# Patient Record
Sex: Female | Born: 1935 | Race: Black or African American | Hispanic: No | Marital: Single | Smoking: Never smoker
Health system: Southern US, Community
[De-identification: ages and names within clinical notes are randomized; demographics above are authoritative.]

## PROBLEM LIST (undated history)

## (undated) DIAGNOSIS — I5030 Unspecified diastolic (congestive) heart failure: Secondary | ICD-10-CM

## (undated) DIAGNOSIS — M545 Other chronic pain: Secondary | ICD-10-CM

## (undated) DIAGNOSIS — K648 Other hemorrhoids: Secondary | ICD-10-CM

## (undated) DIAGNOSIS — F419 Anxiety disorder, unspecified: Secondary | ICD-10-CM

## (undated) DIAGNOSIS — N289 Disorder of kidney and ureter, unspecified: Secondary | ICD-10-CM

## (undated) DIAGNOSIS — K589 Irritable bowel syndrome without diarrhea: Secondary | ICD-10-CM

## (undated) DIAGNOSIS — E039 Hypothyroidism, unspecified: Secondary | ICD-10-CM

## (undated) DIAGNOSIS — E669 Obesity, unspecified: Secondary | ICD-10-CM

## (undated) DIAGNOSIS — R0600 Dyspnea, unspecified: Secondary | ICD-10-CM

## (undated) DIAGNOSIS — D3000 Benign neoplasm of unspecified kidney: Secondary | ICD-10-CM

## (undated) DIAGNOSIS — Z860100 Personal history of colon polyps, unspecified: Secondary | ICD-10-CM

## (undated) DIAGNOSIS — E7800 Pure hypercholesterolemia, unspecified: Secondary | ICD-10-CM

## (undated) DIAGNOSIS — Z8719 Personal history of other diseases of the digestive system: Secondary | ICD-10-CM

## (undated) DIAGNOSIS — I679 Cerebrovascular disease, unspecified: Secondary | ICD-10-CM

## (undated) DIAGNOSIS — G4733 Obstructive sleep apnea (adult) (pediatric): Secondary | ICD-10-CM

## (undated) DIAGNOSIS — D369 Benign neoplasm, unspecified site: Secondary | ICD-10-CM

## (undated) DIAGNOSIS — K5790 Diverticulosis of intestine, part unspecified, without perforation or abscess without bleeding: Secondary | ICD-10-CM

## (undated) DIAGNOSIS — M1990 Unspecified osteoarthritis, unspecified site: Secondary | ICD-10-CM

## (undated) DIAGNOSIS — G8929 Other chronic pain: Secondary | ICD-10-CM

## (undated) DIAGNOSIS — K219 Gastro-esophageal reflux disease without esophagitis: Secondary | ICD-10-CM

## (undated) DIAGNOSIS — L03119 Cellulitis of unspecified part of limb: Secondary | ICD-10-CM

## (undated) DIAGNOSIS — I872 Venous insufficiency (chronic) (peripheral): Secondary | ICD-10-CM

## (undated) DIAGNOSIS — T884XXA Failed or difficult intubation, initial encounter: Secondary | ICD-10-CM

## (undated) DIAGNOSIS — I10 Essential (primary) hypertension: Secondary | ICD-10-CM

## (undated) DIAGNOSIS — K759 Inflammatory liver disease, unspecified: Secondary | ICD-10-CM

## (undated) HISTORY — PX: ABDOMINAL HYSTERECTOMY: SHX81

## (undated) HISTORY — DX: Diverticulosis of intestine, part unspecified, without perforation or abscess without bleeding: K57.90

## (undated) HISTORY — DX: Other hemorrhoids: K64.8

## (undated) HISTORY — PX: TONSILLECTOMY: SUR1361

## (undated) HISTORY — DX: Benign neoplasm, unspecified site: D36.9

## (undated) HISTORY — DX: Cellulitis of unspecified part of limb: L03.119

## (undated) HISTORY — DX: Cerebrovascular disease, unspecified: I67.9

## (undated) HISTORY — DX: Personal history of colon polyps, unspecified: Z86.0100

## (undated) HISTORY — PX: APPENDECTOMY: SHX54

## (undated) HISTORY — PX: DILATION AND CURETTAGE OF UTERUS: SHX78

## (undated) HISTORY — DX: Hypothyroidism, unspecified: E03.9

## (undated) HISTORY — DX: Low back pain: M54.5

## (undated) HISTORY — DX: Gastro-esophageal reflux disease without esophagitis: K21.9

## (undated) HISTORY — DX: Anxiety disorder, unspecified: F41.9

## (undated) HISTORY — DX: Essential (primary) hypertension: I10

## (undated) HISTORY — DX: Personal history of colonic polyps: Z86.010

## (undated) HISTORY — DX: Unspecified diastolic (congestive) heart failure: I50.30

## (undated) HISTORY — DX: Obesity, unspecified: E66.9

## (undated) HISTORY — DX: Disorder of kidney and ureter, unspecified: N28.9

## (undated) HISTORY — DX: Benign neoplasm of unspecified kidney: D30.00

## (undated) HISTORY — DX: Pure hypercholesterolemia, unspecified: E78.00

## (undated) HISTORY — DX: Other chronic pain: G89.29

## (undated) HISTORY — DX: Irritable bowel syndrome, unspecified: K58.9

## (undated) HISTORY — DX: Unspecified osteoarthritis, unspecified site: M19.90

## (undated) HISTORY — PX: OTHER SURGICAL HISTORY: SHX169

## (undated) HISTORY — DX: Other chronic pain: M54.50

## (undated) HISTORY — PX: COLONOSCOPY: SHX174

## (undated) HISTORY — DX: Obstructive sleep apnea (adult) (pediatric): G47.33

## (undated) HISTORY — DX: Venous insufficiency (chronic) (peripheral): I87.2

---

## 1998-02-27 DIAGNOSIS — D369 Benign neoplasm, unspecified site: Secondary | ICD-10-CM

## 1998-02-27 HISTORY — DX: Benign neoplasm, unspecified site: D36.9

## 1998-03-19 ENCOUNTER — Other Ambulatory Visit: Admission: RE | Admit: 1998-03-19 | Discharge: 1998-03-19 | Payer: Self-pay | Admitting: *Deleted

## 1998-08-17 ENCOUNTER — Other Ambulatory Visit: Admission: RE | Admit: 1998-08-17 | Discharge: 1998-08-17 | Payer: Self-pay | Admitting: Gastroenterology

## 1998-08-17 ENCOUNTER — Encounter (INDEPENDENT_AMBULATORY_CARE_PROVIDER_SITE_OTHER): Payer: Self-pay

## 1999-03-24 ENCOUNTER — Other Ambulatory Visit: Admission: RE | Admit: 1999-03-24 | Discharge: 1999-03-24 | Payer: Self-pay | Admitting: *Deleted

## 2000-03-21 ENCOUNTER — Other Ambulatory Visit: Admission: RE | Admit: 2000-03-21 | Discharge: 2000-03-21 | Payer: Self-pay | Admitting: *Deleted

## 2000-10-28 HISTORY — PX: OTHER SURGICAL HISTORY: SHX169

## 2000-11-07 ENCOUNTER — Ambulatory Visit (HOSPITAL_COMMUNITY): Admission: RE | Admit: 2000-11-07 | Discharge: 2000-11-08 | Payer: Self-pay | Admitting: Otolaryngology

## 2000-11-07 ENCOUNTER — Encounter (INDEPENDENT_AMBULATORY_CARE_PROVIDER_SITE_OTHER): Payer: Self-pay | Admitting: *Deleted

## 2001-02-27 HISTORY — PX: MASS EXCISION: SHX2000

## 2001-04-12 ENCOUNTER — Other Ambulatory Visit: Admission: RE | Admit: 2001-04-12 | Discharge: 2001-04-12 | Payer: Self-pay | Admitting: *Deleted

## 2001-06-05 ENCOUNTER — Ambulatory Visit (HOSPITAL_BASED_OUTPATIENT_CLINIC_OR_DEPARTMENT_OTHER): Admission: RE | Admit: 2001-06-05 | Discharge: 2001-06-05 | Payer: Self-pay | Admitting: Nephrology

## 2001-06-05 ENCOUNTER — Encounter: Payer: Self-pay | Admitting: Internal Medicine

## 2002-03-11 ENCOUNTER — Encounter: Payer: Self-pay | Admitting: Gastroenterology

## 2002-03-11 ENCOUNTER — Ambulatory Visit (HOSPITAL_COMMUNITY): Admission: RE | Admit: 2002-03-11 | Discharge: 2002-03-11 | Payer: Self-pay | Admitting: Gastroenterology

## 2002-05-14 ENCOUNTER — Other Ambulatory Visit: Admission: RE | Admit: 2002-05-14 | Discharge: 2002-05-14 | Payer: Self-pay | Admitting: *Deleted

## 2003-05-29 HISTORY — PX: FUNCTIONAL ENDOSCOPIC SINUS SURGERY: SUR616

## 2003-06-17 ENCOUNTER — Encounter: Admission: RE | Admit: 2003-06-17 | Discharge: 2003-06-17 | Payer: Self-pay | Admitting: Otolaryngology

## 2003-06-18 ENCOUNTER — Ambulatory Visit (HOSPITAL_BASED_OUTPATIENT_CLINIC_OR_DEPARTMENT_OTHER): Admission: RE | Admit: 2003-06-18 | Discharge: 2003-06-18 | Payer: Self-pay | Admitting: Otolaryngology

## 2003-06-18 ENCOUNTER — Ambulatory Visit: Admission: RE | Admit: 2003-06-18 | Discharge: 2003-06-18 | Payer: Self-pay | Admitting: Otolaryngology

## 2003-06-18 ENCOUNTER — Encounter (INDEPENDENT_AMBULATORY_CARE_PROVIDER_SITE_OTHER): Payer: Self-pay | Admitting: Specialist

## 2004-01-20 ENCOUNTER — Ambulatory Visit: Payer: Self-pay | Admitting: Pulmonary Disease

## 2004-02-08 ENCOUNTER — Ambulatory Visit: Payer: Self-pay | Admitting: Gastroenterology

## 2004-03-01 ENCOUNTER — Ambulatory Visit: Payer: Self-pay | Admitting: Pulmonary Disease

## 2004-05-09 ENCOUNTER — Ambulatory Visit: Payer: Self-pay | Admitting: Gastroenterology

## 2004-05-24 ENCOUNTER — Ambulatory Visit: Payer: Self-pay | Admitting: Gastroenterology

## 2004-07-13 ENCOUNTER — Ambulatory Visit: Payer: Self-pay | Admitting: Gastroenterology

## 2004-07-20 ENCOUNTER — Ambulatory Visit: Payer: Self-pay | Admitting: Pulmonary Disease

## 2004-12-09 ENCOUNTER — Ambulatory Visit: Payer: Self-pay | Admitting: Pulmonary Disease

## 2005-01-16 ENCOUNTER — Ambulatory Visit: Payer: Self-pay | Admitting: Pulmonary Disease

## 2005-07-17 ENCOUNTER — Ambulatory Visit: Payer: Self-pay | Admitting: Pulmonary Disease

## 2005-10-26 ENCOUNTER — Ambulatory Visit: Payer: Self-pay | Admitting: Pulmonary Disease

## 2005-12-12 ENCOUNTER — Ambulatory Visit: Payer: Self-pay | Admitting: Pulmonary Disease

## 2005-12-25 ENCOUNTER — Ambulatory Visit: Payer: Self-pay | Admitting: Pulmonary Disease

## 2006-01-04 ENCOUNTER — Ambulatory Visit: Payer: Self-pay | Admitting: Pulmonary Disease

## 2006-03-01 ENCOUNTER — Ambulatory Visit: Payer: Self-pay | Admitting: Pulmonary Disease

## 2006-03-27 ENCOUNTER — Ambulatory Visit: Payer: Self-pay | Admitting: Pulmonary Disease

## 2006-05-08 ENCOUNTER — Ambulatory Visit: Payer: Self-pay | Admitting: Pulmonary Disease

## 2006-05-16 ENCOUNTER — Ambulatory Visit: Payer: Self-pay | Admitting: Cardiovascular Disease

## 2006-05-22 ENCOUNTER — Ambulatory Visit: Payer: Self-pay | Admitting: Pulmonary Disease

## 2006-05-22 LAB — CONVERTED CEMR LAB
OCCULT 2: NEGATIVE
OCCULT 3: NEGATIVE

## 2006-06-02 ENCOUNTER — Encounter: Admission: RE | Admit: 2006-06-02 | Discharge: 2006-06-02 | Payer: Self-pay | Admitting: Urology

## 2006-06-05 ENCOUNTER — Ambulatory Visit: Payer: Self-pay

## 2006-06-05 ENCOUNTER — Encounter: Payer: Self-pay | Admitting: Cardiology

## 2006-06-18 ENCOUNTER — Ambulatory Visit: Payer: Self-pay | Admitting: Pulmonary Disease

## 2006-06-29 ENCOUNTER — Ambulatory Visit: Payer: Self-pay | Admitting: Gastroenterology

## 2006-09-11 ENCOUNTER — Ambulatory Visit: Payer: Self-pay | Admitting: Pulmonary Disease

## 2006-09-11 LAB — CONVERTED CEMR LAB
ALT: 23 units/L (ref 0–35)
AST: 25 units/L (ref 0–37)
Albumin: 3.9 g/dL (ref 3.5–5.2)
Alkaline Phosphatase: 55 units/L (ref 39–117)
BUN: 19 mg/dL (ref 6–23)
Basophils Absolute: 0.1 10*3/uL (ref 0.0–0.1)
Calcium: 9.1 mg/dL (ref 8.4–10.5)
Chloride: 107 meq/L (ref 96–112)
Cholesterol: 156 mg/dL (ref 0–200)
Creatinine, Ser: 1.1 mg/dL (ref 0.4–1.2)
GFR calc non Af Amer: 52 mL/min
HCT: 37.8 % (ref 36.0–46.0)
LDL Cholesterol: 78 mg/dL (ref 0–99)
MCHC: 34.3 g/dL (ref 30.0–36.0)
Monocytes Relative: 11.7 % — ABNORMAL HIGH (ref 3.0–11.0)
Neutrophils Relative %: 30.9 % — ABNORMAL LOW (ref 43.0–77.0)
Platelets: 236 10*3/uL (ref 150–400)
RBC: 4.31 M/uL (ref 3.87–5.11)
RDW: 13.9 % (ref 11.5–14.6)
Total Bilirubin: 0.8 mg/dL (ref 0.3–1.2)
Total CHOL/HDL Ratio: 2.9
Triglycerides: 118 mg/dL (ref 0–149)

## 2006-10-26 ENCOUNTER — Ambulatory Visit: Payer: Self-pay | Admitting: Pulmonary Disease

## 2006-11-12 ENCOUNTER — Encounter: Admission: RE | Admit: 2006-11-12 | Discharge: 2006-11-12 | Payer: Self-pay | Admitting: Orthopedic Surgery

## 2006-11-14 ENCOUNTER — Ambulatory Visit (HOSPITAL_BASED_OUTPATIENT_CLINIC_OR_DEPARTMENT_OTHER): Admission: RE | Admit: 2006-11-14 | Discharge: 2006-11-14 | Payer: Self-pay | Admitting: Orthopedic Surgery

## 2006-11-14 ENCOUNTER — Encounter (INDEPENDENT_AMBULATORY_CARE_PROVIDER_SITE_OTHER): Payer: Self-pay | Admitting: Orthopedic Surgery

## 2006-12-12 ENCOUNTER — Ambulatory Visit: Payer: Self-pay | Admitting: Pulmonary Disease

## 2007-01-04 DIAGNOSIS — E785 Hyperlipidemia, unspecified: Secondary | ICD-10-CM | POA: Insufficient documentation

## 2007-01-04 DIAGNOSIS — K219 Gastro-esophageal reflux disease without esophagitis: Secondary | ICD-10-CM

## 2007-01-04 DIAGNOSIS — D126 Benign neoplasm of colon, unspecified: Secondary | ICD-10-CM | POA: Insufficient documentation

## 2007-01-04 DIAGNOSIS — E039 Hypothyroidism, unspecified: Secondary | ICD-10-CM | POA: Insufficient documentation

## 2007-01-04 DIAGNOSIS — M109 Gout, unspecified: Secondary | ICD-10-CM | POA: Insufficient documentation

## 2007-01-04 DIAGNOSIS — M1990 Unspecified osteoarthritis, unspecified site: Secondary | ICD-10-CM | POA: Insufficient documentation

## 2007-01-04 DIAGNOSIS — K589 Irritable bowel syndrome without diarrhea: Secondary | ICD-10-CM

## 2007-01-04 DIAGNOSIS — I10 Essential (primary) hypertension: Secondary | ICD-10-CM | POA: Insufficient documentation

## 2007-01-04 DIAGNOSIS — M545 Low back pain: Secondary | ICD-10-CM

## 2007-01-04 DIAGNOSIS — I872 Venous insufficiency (chronic) (peripheral): Secondary | ICD-10-CM | POA: Insufficient documentation

## 2007-03-21 ENCOUNTER — Ambulatory Visit: Payer: Self-pay | Admitting: Pulmonary Disease

## 2007-03-21 DIAGNOSIS — D3000 Benign neoplasm of unspecified kidney: Secondary | ICD-10-CM

## 2007-03-21 DIAGNOSIS — F411 Generalized anxiety disorder: Secondary | ICD-10-CM | POA: Insufficient documentation

## 2007-03-21 DIAGNOSIS — I679 Cerebrovascular disease, unspecified: Secondary | ICD-10-CM

## 2007-03-21 DIAGNOSIS — N184 Chronic kidney disease, stage 4 (severe): Secondary | ICD-10-CM | POA: Insufficient documentation

## 2007-03-21 DIAGNOSIS — N183 Chronic kidney disease, stage 3 (moderate): Secondary | ICD-10-CM

## 2007-03-22 ENCOUNTER — Telehealth (INDEPENDENT_AMBULATORY_CARE_PROVIDER_SITE_OTHER): Payer: Self-pay | Admitting: *Deleted

## 2007-04-09 ENCOUNTER — Ambulatory Visit: Payer: Self-pay | Admitting: Internal Medicine

## 2007-04-11 ENCOUNTER — Ambulatory Visit: Payer: Self-pay | Admitting: Pulmonary Disease

## 2007-04-11 LAB — CONVERTED CEMR LAB
Fecal Occult Blood: NEGATIVE
OCCULT 2: NEGATIVE
OCCULT 3: NEGATIVE
OCCULT 4: NEGATIVE
OCCULT 5: NEGATIVE

## 2007-04-19 LAB — CONVERTED CEMR LAB
ALT: 24 units/L (ref 0–35)
AST: 24 units/L (ref 0–37)
Albumin: 4.2 g/dL (ref 3.5–5.2)
Alkaline Phosphatase: 57 units/L (ref 39–117)
BUN: 15 mg/dL (ref 6–23)
Bacteria, UA: NEGATIVE
Basophils Absolute: 0.1 10*3/uL (ref 0.0–0.1)
Bilirubin, Direct: 0.1 mg/dL (ref 0.0–0.3)
Calcium: 9.6 mg/dL (ref 8.4–10.5)
Chloride: 102 meq/L (ref 96–112)
Eosinophils Absolute: 0.2 10*3/uL (ref 0.0–0.6)
Eosinophils Relative: 2.4 % (ref 0.0–5.0)
GFR calc Af Amer: 70 mL/min
GFR calc non Af Amer: 58 mL/min
Glucose, Bld: 93 mg/dL (ref 70–99)
HCT: 38.4 % (ref 36.0–46.0)
Hemoglobin, Urine: NEGATIVE
Ketones, ur: NEGATIVE mg/dL
LDL Cholesterol: 94 mg/dL (ref 0–99)
Leukocytes, UA: NEGATIVE
Mucus, UA: NEGATIVE
Neutrophils Relative %: 37.8 % — ABNORMAL LOW (ref 43.0–77.0)
Platelets: 268 10*3/uL (ref 150–400)
RBC: 4.4 M/uL (ref 3.87–5.11)
RDW: 12.8 % (ref 11.5–14.6)
Total CHOL/HDL Ratio: 3.3
Total Protein, Urine: NEGATIVE mg/dL
Triglycerides: 158 mg/dL — ABNORMAL HIGH (ref 0–149)
WBC: 8.7 10*3/uL (ref 4.5–10.5)
pH: 7 (ref 5.0–8.0)

## 2007-09-17 ENCOUNTER — Ambulatory Visit: Payer: Self-pay | Admitting: Pulmonary Disease

## 2007-09-22 DIAGNOSIS — H409 Unspecified glaucoma: Secondary | ICD-10-CM | POA: Insufficient documentation

## 2007-09-23 LAB — CONVERTED CEMR LAB
ALT: 26 units/L (ref 0–35)
AST: 28 units/L (ref 0–37)
Albumin: 4.2 g/dL (ref 3.5–5.2)
BUN: 19 mg/dL (ref 6–23)
Basophils Relative: 0.5 % (ref 0.0–3.0)
CO2: 28 meq/L (ref 19–32)
Calcium: 10 mg/dL (ref 8.4–10.5)
Chloride: 102 meq/L (ref 96–112)
Cholesterol: 159 mg/dL (ref 0–200)
Creatinine, Ser: 1.1 mg/dL (ref 0.4–1.2)
Eosinophils Absolute: 0.2 10*3/uL (ref 0.0–0.7)
Eosinophils Relative: 2.3 % (ref 0.0–5.0)
GFR calc non Af Amer: 52 mL/min
Hemoglobin: 13.3 g/dL (ref 12.0–15.0)
MCHC: 33.8 g/dL (ref 30.0–36.0)
MCV: 89 fL (ref 78.0–100.0)
Neutro Abs: 3.1 10*3/uL (ref 1.4–7.7)
Neutrophils Relative %: 35.1 % — ABNORMAL LOW (ref 43.0–77.0)
RBC: 4.41 M/uL (ref 3.87–5.11)
TSH: 2.34 microintl units/mL (ref 0.35–5.50)
Uric Acid, Serum: 8.1 mg/dL — ABNORMAL HIGH (ref 2.4–7.0)
VLDL: 28 mg/dL (ref 0–40)
WBC: 8.9 10*3/uL (ref 4.5–10.5)

## 2007-09-24 ENCOUNTER — Telehealth (INDEPENDENT_AMBULATORY_CARE_PROVIDER_SITE_OTHER): Payer: Self-pay | Admitting: *Deleted

## 2007-09-27 ENCOUNTER — Telehealth (INDEPENDENT_AMBULATORY_CARE_PROVIDER_SITE_OTHER): Payer: Self-pay | Admitting: *Deleted

## 2007-10-14 ENCOUNTER — Ambulatory Visit (HOSPITAL_BASED_OUTPATIENT_CLINIC_OR_DEPARTMENT_OTHER): Admission: RE | Admit: 2007-10-14 | Discharge: 2007-10-14 | Payer: Self-pay | Admitting: Pulmonary Disease

## 2007-10-14 ENCOUNTER — Encounter: Payer: Self-pay | Admitting: Pulmonary Disease

## 2007-10-18 ENCOUNTER — Ambulatory Visit: Payer: Self-pay | Admitting: Pulmonary Disease

## 2007-10-21 ENCOUNTER — Encounter: Payer: Self-pay | Admitting: Pulmonary Disease

## 2007-11-11 ENCOUNTER — Ambulatory Visit: Payer: Self-pay | Admitting: Pulmonary Disease

## 2007-11-11 DIAGNOSIS — G4733 Obstructive sleep apnea (adult) (pediatric): Secondary | ICD-10-CM

## 2007-11-27 ENCOUNTER — Ambulatory Visit: Payer: Self-pay | Admitting: Pulmonary Disease

## 2007-12-11 ENCOUNTER — Ambulatory Visit: Payer: Self-pay | Admitting: Pulmonary Disease

## 2007-12-13 ENCOUNTER — Telehealth (INDEPENDENT_AMBULATORY_CARE_PROVIDER_SITE_OTHER): Payer: Self-pay | Admitting: *Deleted

## 2007-12-13 ENCOUNTER — Telehealth: Payer: Self-pay | Admitting: Pulmonary Disease

## 2008-01-02 ENCOUNTER — Encounter: Payer: Self-pay | Admitting: Pulmonary Disease

## 2008-01-09 ENCOUNTER — Telehealth: Payer: Self-pay | Admitting: Pulmonary Disease

## 2008-01-20 ENCOUNTER — Telehealth (INDEPENDENT_AMBULATORY_CARE_PROVIDER_SITE_OTHER): Payer: Self-pay | Admitting: *Deleted

## 2008-02-03 ENCOUNTER — Telehealth (INDEPENDENT_AMBULATORY_CARE_PROVIDER_SITE_OTHER): Payer: Self-pay | Admitting: *Deleted

## 2008-02-03 ENCOUNTER — Encounter: Payer: Self-pay | Admitting: Pulmonary Disease

## 2008-03-09 ENCOUNTER — Encounter: Payer: Self-pay | Admitting: Pulmonary Disease

## 2008-03-20 ENCOUNTER — Ambulatory Visit: Payer: Self-pay | Admitting: Pulmonary Disease

## 2008-03-22 LAB — CONVERTED CEMR LAB
AST: 28 units/L (ref 0–37)
Alkaline Phosphatase: 50 units/L (ref 39–117)
Bilirubin, Direct: 0.1 mg/dL (ref 0.0–0.3)
Chloride: 103 meq/L (ref 96–112)
GFR calc non Af Amer: 58 mL/min
LDL Cholesterol: 75 mg/dL (ref 0–99)
Potassium: 4.1 meq/L (ref 3.5–5.1)
Total Bilirubin: 0.7 mg/dL (ref 0.3–1.2)
Total CHOL/HDL Ratio: 2.8

## 2008-04-23 ENCOUNTER — Encounter: Payer: Self-pay | Admitting: Pulmonary Disease

## 2008-04-24 ENCOUNTER — Encounter: Payer: Self-pay | Admitting: Pulmonary Disease

## 2008-04-27 ENCOUNTER — Emergency Department (HOSPITAL_COMMUNITY): Admission: EM | Admit: 2008-04-27 | Discharge: 2008-04-28 | Payer: Self-pay | Admitting: Emergency Medicine

## 2008-04-28 ENCOUNTER — Telehealth (INDEPENDENT_AMBULATORY_CARE_PROVIDER_SITE_OTHER): Payer: Self-pay | Admitting: *Deleted

## 2008-05-01 ENCOUNTER — Ambulatory Visit: Payer: Self-pay | Admitting: Internal Medicine

## 2008-05-05 DIAGNOSIS — R0789 Other chest pain: Secondary | ICD-10-CM | POA: Insufficient documentation

## 2008-05-19 ENCOUNTER — Encounter: Payer: Self-pay | Admitting: Cardiovascular Disease

## 2008-05-20 ENCOUNTER — Ambulatory Visit: Payer: Self-pay | Admitting: Cardiovascular Disease

## 2008-06-04 ENCOUNTER — Ambulatory Visit: Payer: Self-pay

## 2008-06-04 ENCOUNTER — Encounter: Payer: Self-pay | Admitting: Cardiovascular Disease

## 2008-06-08 ENCOUNTER — Ambulatory Visit: Payer: Self-pay | Admitting: Pulmonary Disease

## 2008-06-17 ENCOUNTER — Encounter: Payer: Self-pay | Admitting: Pulmonary Disease

## 2008-07-28 ENCOUNTER — Telehealth (INDEPENDENT_AMBULATORY_CARE_PROVIDER_SITE_OTHER): Payer: Self-pay | Admitting: *Deleted

## 2008-08-11 ENCOUNTER — Telehealth (INDEPENDENT_AMBULATORY_CARE_PROVIDER_SITE_OTHER): Payer: Self-pay | Admitting: *Deleted

## 2008-08-20 ENCOUNTER — Telehealth (INDEPENDENT_AMBULATORY_CARE_PROVIDER_SITE_OTHER): Payer: Self-pay | Admitting: *Deleted

## 2008-08-24 ENCOUNTER — Ambulatory Visit: Payer: Self-pay | Admitting: Pulmonary Disease

## 2008-08-24 DIAGNOSIS — J309 Allergic rhinitis, unspecified: Secondary | ICD-10-CM | POA: Insufficient documentation

## 2008-09-17 ENCOUNTER — Ambulatory Visit: Payer: Self-pay | Admitting: Pulmonary Disease

## 2008-09-17 LAB — CONVERTED CEMR LAB: Vit D, 25-Hydroxy: 46 ng/mL (ref 30–89)

## 2008-09-19 LAB — CONVERTED CEMR LAB
Alkaline Phosphatase: 51 units/L (ref 39–117)
Basophils Absolute: 0 10*3/uL (ref 0.0–0.1)
Basophils Relative: 0.6 % (ref 0.0–3.0)
Bilirubin, Direct: 0.1 mg/dL (ref 0.0–0.3)
CO2: 31 meq/L (ref 19–32)
Calcium: 9.6 mg/dL (ref 8.4–10.5)
Creatinine, Ser: 1.1 mg/dL (ref 0.4–1.2)
Eosinophils Absolute: 0.4 10*3/uL (ref 0.0–0.7)
HDL: 58.6 mg/dL (ref >39.00)
LDL Cholesterol: 76 mg/dL (ref 0–99)
Lymphocytes Relative: 50.2 % — ABNORMAL HIGH (ref 12.0–46.0)
MCHC: 34.1 g/dL (ref 30.0–36.0)
Neutrophils Relative %: 36.8 % — ABNORMAL LOW (ref 43.0–77.0)
RBC: 4.3 M/uL (ref 3.87–5.11)
Total CHOL/HDL Ratio: 3
Total Protein: 7.6 g/dL (ref 6.0–8.3)
Triglycerides: 81 mg/dL (ref 0.0–149.0)
VLDL: 16.2 mg/dL (ref 0.0–40.0)

## 2008-10-21 ENCOUNTER — Encounter: Payer: Self-pay | Admitting: Pulmonary Disease

## 2008-11-18 ENCOUNTER — Telehealth (INDEPENDENT_AMBULATORY_CARE_PROVIDER_SITE_OTHER): Payer: Self-pay | Admitting: *Deleted

## 2008-11-25 ENCOUNTER — Ambulatory Visit: Payer: Self-pay | Admitting: Pulmonary Disease

## 2009-01-27 ENCOUNTER — Telehealth (INDEPENDENT_AMBULATORY_CARE_PROVIDER_SITE_OTHER): Payer: Self-pay | Admitting: *Deleted

## 2009-03-16 ENCOUNTER — Ambulatory Visit: Payer: Self-pay | Admitting: Pulmonary Disease

## 2009-03-16 LAB — CONVERTED CEMR LAB
ALT: 21 U/L
AST: 21 U/L
Albumin: 4 g/dL
Alkaline Phosphatase: 56 U/L
BUN: 16 mg/dL
Basophils Absolute: 0.1 10*3/uL
Basophils Relative: 0.6 %
Bilirubin, Direct: 0.1 mg/dL
CO2: 30 meq/L
Calcium: 10 mg/dL
Chloride: 103 meq/L
Cholesterol: 155 mg/dL
Creatinine, Ser: 1 mg/dL
Eosinophils Absolute: 0.3 10*3/uL
Eosinophils Relative: 2.9 %
GFR calc non Af Amer: 69.48 mL/min
Glucose, Bld: 71 mg/dL
HCT: 38.3 %
HDL: 48.4 mg/dL
Hemoglobin: 12.5 g/dL
LDL Cholesterol: 77 mg/dL
Lymphocytes Relative: 39.5 %
Lymphs Abs: 4.3 10*3/uL — ABNORMAL HIGH
MCHC: 32.8 g/dL
MCV: 91.4 fL
Monocytes Absolute: 1.1 10*3/uL — ABNORMAL HIGH
Monocytes Relative: 10.2 %
Neutro Abs: 5 10*3/uL
Neutrophils Relative %: 46.8 %
Platelets: 294 10*3/uL
Potassium: 4 meq/L
RBC: 4.19 M/uL
RDW: 13 %
Sodium: 146 meq/L — ABNORMAL HIGH
TSH: 2.95 u[IU]/mL
Total Bilirubin: 0.5 mg/dL
Total CHOL/HDL Ratio: 3
Total Protein: 7.8 g/dL
Triglycerides: 148 mg/dL
Uric Acid, Serum: 5.7 mg/dL
VLDL: 29.6 mg/dL
WBC: 10.8 10*3/uL — ABNORMAL HIGH

## 2009-04-23 ENCOUNTER — Encounter (INDEPENDENT_AMBULATORY_CARE_PROVIDER_SITE_OTHER): Payer: Self-pay | Admitting: *Deleted

## 2009-04-27 ENCOUNTER — Encounter (INDEPENDENT_AMBULATORY_CARE_PROVIDER_SITE_OTHER): Payer: Self-pay | Admitting: *Deleted

## 2009-04-28 ENCOUNTER — Ambulatory Visit: Payer: Self-pay | Admitting: Pulmonary Disease

## 2009-05-03 ENCOUNTER — Encounter (INDEPENDENT_AMBULATORY_CARE_PROVIDER_SITE_OTHER): Payer: Self-pay

## 2009-05-04 ENCOUNTER — Ambulatory Visit: Payer: Self-pay | Admitting: Internal Medicine

## 2009-05-12 ENCOUNTER — Telehealth (INDEPENDENT_AMBULATORY_CARE_PROVIDER_SITE_OTHER): Payer: Self-pay | Admitting: *Deleted

## 2009-05-20 ENCOUNTER — Ambulatory Visit: Payer: Self-pay | Admitting: Internal Medicine

## 2009-05-26 ENCOUNTER — Encounter: Payer: Self-pay | Admitting: Internal Medicine

## 2009-06-24 ENCOUNTER — Encounter: Payer: Self-pay | Admitting: Pulmonary Disease

## 2009-06-28 ENCOUNTER — Ambulatory Visit: Payer: Self-pay | Admitting: Pulmonary Disease

## 2009-08-27 ENCOUNTER — Telehealth (INDEPENDENT_AMBULATORY_CARE_PROVIDER_SITE_OTHER): Payer: Self-pay | Admitting: *Deleted

## 2009-08-31 ENCOUNTER — Ambulatory Visit: Payer: Self-pay | Admitting: Pulmonary Disease

## 2009-09-02 ENCOUNTER — Ambulatory Visit: Payer: Self-pay | Admitting: Pulmonary Disease

## 2009-09-05 LAB — CONVERTED CEMR LAB
Albumin: 4 g/dL (ref 3.5–5.2)
BUN: 17 mg/dL (ref 6–23)
Bilirubin, Direct: 0.1 mg/dL (ref 0.0–0.3)
Chloride: 106 meq/L (ref 96–112)
Cholesterol: 147 mg/dL (ref 0–200)
HDL: 55.5 mg/dL (ref >39.00)
LDL Cholesterol: 71 mg/dL (ref 0–99)
Potassium: 3.9 meq/L (ref 3.5–5.1)
Sodium: 140 meq/L (ref 135–145)
TSH: 2.64 microintl units/mL (ref 0.35–5.50)
Total Protein: 7 g/dL (ref 6.0–8.3)
VLDL: 20.6 mg/dL (ref 0.0–40.0)
Vit D, 25-Hydroxy: 68 ng/mL (ref 30–89)

## 2009-10-14 ENCOUNTER — Telehealth: Payer: Self-pay | Admitting: Pulmonary Disease

## 2009-11-26 ENCOUNTER — Ambulatory Visit: Payer: Self-pay | Admitting: Pulmonary Disease

## 2009-11-28 ENCOUNTER — Encounter: Payer: Self-pay | Admitting: Pulmonary Disease

## 2010-03-08 ENCOUNTER — Ambulatory Visit
Admission: RE | Admit: 2010-03-08 | Discharge: 2010-03-08 | Payer: Self-pay | Source: Home / Self Care | Attending: Pulmonary Disease | Admitting: Pulmonary Disease

## 2010-03-08 ENCOUNTER — Other Ambulatory Visit: Payer: Self-pay | Admitting: Pulmonary Disease

## 2010-03-08 LAB — LIPID PANEL
Cholesterol: 162 mg/dL (ref 0–200)
HDL: 60.1 mg/dL (ref >39.00)
LDL Cholesterol: 80 mg/dL (ref 0–99)
Total CHOL/HDL Ratio: 3
Triglycerides: 110 mg/dL (ref 0.0–149.0)
VLDL: 22 mg/dL (ref 0.0–40.0)

## 2010-03-08 LAB — BASIC METABOLIC PANEL
BUN: 19 mg/dL (ref 6–23)
CO2: 31 mEq/L (ref 19–32)
Calcium: 9.7 mg/dL (ref 8.4–10.5)
Chloride: 105 mEq/L (ref 96–112)
Creatinine, Ser: 0.9 mg/dL (ref 0.4–1.2)
GFR: 83.6 mL/min (ref >60.00)
Glucose, Bld: 86 mg/dL (ref 70–99)
Potassium: 4.2 mEq/L (ref 3.5–5.1)
Sodium: 142 mEq/L (ref 135–145)

## 2010-03-08 LAB — HEPATIC FUNCTION PANEL
ALT: 22 U/L (ref 0–35)
AST: 23 U/L (ref 0–37)
Albumin: 4 g/dL (ref 3.5–5.2)
Alkaline Phosphatase: 50 U/L (ref 39–117)
Bilirubin, Direct: 0.1 mg/dL (ref 0.0–0.3)
Total Bilirubin: 0.6 mg/dL (ref 0.3–1.2)
Total Protein: 7.4 g/dL (ref 6.0–8.3)

## 2010-03-08 LAB — CBC WITH DIFFERENTIAL/PLATELET
Basophils Absolute: 0.1 10*3/uL (ref 0.0–0.1)
Basophils Relative: 0.7 % (ref 0.0–3.0)
Eosinophils Absolute: 0.5 10*3/uL (ref 0.0–0.7)
Eosinophils Relative: 5.4 % — ABNORMAL HIGH (ref 0.0–5.0)
HCT: 39 % (ref 36.0–46.0)
Hemoglobin: 13.2 g/dL (ref 12.0–15.0)
Lymphocytes Relative: 49.2 % — ABNORMAL HIGH (ref 12.0–46.0)
Lymphs Abs: 4.4 10*3/uL — ABNORMAL HIGH (ref 0.7–4.0)
MCHC: 33.8 g/dL (ref 30.0–36.0)
MCV: 89.8 fl (ref 78.0–100.0)
Monocytes Absolute: 0.8 10*3/uL (ref 0.1–1.0)
Monocytes Relative: 9.3 % (ref 3.0–12.0)
Neutro Abs: 3.2 10*3/uL (ref 1.4–7.7)
Neutrophils Relative %: 35.4 % — ABNORMAL LOW (ref 43.0–77.0)
Platelets: 276 10*3/uL (ref 150.0–400.0)
RBC: 4.34 Mil/uL (ref 3.87–5.11)
RDW: 14.1 % (ref 11.5–14.6)
WBC: 9 10*3/uL (ref 4.5–10.5)

## 2010-03-08 LAB — TSH: TSH: 3.59 u[IU]/mL (ref 0.35–5.50)

## 2010-03-08 LAB — URIC ACID: Uric Acid, Serum: 6.1 mg/dL (ref 2.4–7.0)

## 2010-03-20 ENCOUNTER — Encounter: Payer: Self-pay | Admitting: Urology

## 2010-03-29 NOTE — Miscellaneous (Signed)
Summary: Lec previsit  Clinical Lists Changes  Medications: Added new medication of MOVIPREP 100 GM  SOLR (PEG-KCL-NACL-NASULF-NA ASC-C) As per prep instructions. - Signed Rx of MOVIPREP 100 GM  SOLR (PEG-KCL-NACL-NASULF-NA ASC-C) As per prep instructions.;  #1 x 0;  Signed;  Entered by: Ulis Rias RN;  Authorized by: Iva Boop MD, FACG;  Method used: Print then Give to Patient Observations: Added new observation of ALLERGY REV: Done (05/04/2009 13:29)    Prescriptions: MOVIPREP 100 GM  SOLR (PEG-KCL-NACL-NASULF-NA ASC-C) As per prep instructions.  #1 x 0   Entered by:   Ulis Rias RN   Authorized by:   Iva Boop MD, [facility]   Signed by:   Ulis Rias RN on 05/04/2009   Method used:   Print then Give to Patient   RxID:   [id number]

## 2010-03-29 NOTE — Procedures (Signed)
Summary: Colonoscopy  Patient: April Donaldson Note: All result statuses are Final unless otherwise noted.  Tests: (1) Colonoscopy (COL)   COL Colonoscopy           DONE     [facility]     [address]     [place], [place]  [address]           COLONOSCOPY PROCEDURE REPORT           PATIENT:  April Donaldson, April Donaldson  MR#:  [id number]     BIRTHDATE:  [birth year], 77 yrs. old  GENDER:  female     ENDOSCOPIST:  Iva Boop, MD, [facility]           PROCEDURE DATE:  05/20/2009     PROCEDURE:  Colonoscopy with biopsy     ASA CLASS:  Class II     INDICATIONS:  surveillance and screening, history of pre-cancerous     (adenomatous) colon polyps diminutive polyps destroyed in 2000, no     polyps 2002, 5 mm adenoma removed in 2006     MEDICATIONS:   Fentanyl 100 mcg IV, Versed 9 mg IV           DESCRIPTION OF PROCEDURE:   After the risks benefits and     alternatives of the procedure were thoroughly explained, informed     consent was obtained.  Digital rectal exam was performed and     revealed no abnormalities.   The  endoscope was introduced through     the anus and advanced to the cecum, which was identified by both     the appendix and ileocecal valve, without limitations.  The     quality of the prep was excellent, using MoviPrep.  The instrument     was then slowly withdrawn as the colon was fully examined.     Insertion: 7:22 minutes Withdrawal: 8:54 minutes     <<PROCEDUREIMAGES>>           FINDINGS:  Two polyps were found in the proximal transverse colon.     They were diminutive. The polyp was removed using cold biopsy     forceps.  Scattered diverticula were found in the left colon.     There is a mild stenosis in the sigmoid colon. Somewhat fixed     here, thought due to external adhesions. She was sensitive to     scope passage here.  This was otherwise a normal examination of     the colon.   Retroflexed views in the rectum revealed internal     hemorrhoids.    The scope was  then withdrawn from the patient and     the procedure completed.           COMPLICATIONS:  None     ENDOSCOPIC IMPRESSION:     1) Two polyps in the proximal transverse colon - diminutive and     removed     2) Diverticula, scattered in the left colon     3) Stenosis in the sigmoid colon - thought due to adhesions     4) Internal hemorrhoids     5) Otherwise normal examination           REPEAT EXAM:  In for Colonoscopy, pending biopsy results. She is     77 and given history of diminutive polyps only and the stenotic     and fixed sigmoid colon, may not recommend recall as her risk  profile for cancer is low.           Iva Boop, MD, [facility]           CC:  The Patient           n.     eSIGNED:   Iva Boop at 05/20/2009 02:27 PM           Sonda Primes, [id number]  Note: An exclamation mark (!) indicates a result that was not dispersed into the flowsheet. Document Creation Date: 05/20/2009 2:27 PM _______________________________________________________________________  (1) Order result status: Final Collection or observation date-time: 05/20/2009 14:13 Requested date-time:  Receipt date-time:  Reported date-time:  Referring Physician:   Ordering Physician: Stan Head [phone]) Specimen Source:  Source: Launa Grill Order Number: [id number] Lab site:   Appended Document: Colonoscopy     Procedures Next Due Date:    Colonoscopy: 05/2014

## 2010-03-29 NOTE — Letter (Signed)
Summary: Previsit letter  [facility]  [address] [place], [place] [address]   Phone: [phone]  Fax: [phone]       04/27/2009 MRN: [id number]  April Donaldson [address] [place], [place]  [address]  Dear [name],  Welcome to the Gastroenterology Division at [facility].    You are scheduled to see a nurse for your pre-procedure visit on 05/04/2009 at 1:30PM on the 3rd floor at [facility], [address]  We ask that you try to arrive at our office 15 minutes prior to your appointment time to allow for check-in.  Your nurse visit will consist of discussing your medical and surgical history, your immediate family medical history, and your medications.    Please bring a complete list of all your medications or, if you prefer, bring the medication bottles and we will list them.  We will need to be aware of both prescribed and over the counter drugs.  We will need to know exact dosage information as well.  If you are on blood thinners (Coumadin, Plavix, Aggrenox, Ticlid, etc.) please call our office today/prior to your appointment, as we need to consult with your physician about holding your medication.   Please be prepared to read and sign documents such as consent forms, a financial agreement, and acknowledgement forms.  If necessary, and with your consent, a friend or relative is welcome to sit-in on the nurse visit with you.  Please bring your insurance card so that we may make a copy of it.  If your insurance requires a referral to see a specialist, please bring your referral form from your primary care physician.  No co-pay is required for this nurse visit.     If you cannot keep your appointment, please call [phone] to cancel or reschedule prior to your appointment date.  This allows [place] the opportunity to schedule an appointment for another patient in need of care.    Thank you for choosing [facility] for your medical needs.   We appreciate the opportunity to care for you.  Please visit [place] at our website  to learn more about our practice.                     Sincerely.                                                                                                                   The Gastroenterology Division

## 2010-03-29 NOTE — Letter (Signed)
Summary: [facility] Instructions  [facility]  [address] [place], [place] [address]   Phone: [phone]  Fax: [phone]       April Donaldson    1931/10/17    MRN: [id number]        Procedure Day /Date:  Thursday 05/20/2009     Arrival Time: 12:30 pm      Procedure Time: 1:30 pm     Location of Procedure:                    _x _  [facility] (4th Floor)                        PREPARATION FOR COLONOSCOPY WITH MOVIPREP   Starting 5 days prior to your procedure Saturday 3/19 do not eat nuts, seeds, popcorn, corn, beans, peas,  salads, or any raw vegetables.  Do not take any fiber supplements (e.g. Metamucil, Citrucel, and Benefiber).  THE DAY BEFORE YOUR PROCEDURE         DATE: Wednesday 3/23  1.  Drink clear liquids the entire day-NO SOLID FOOD  2.  Do not drink anything colored red or purple.  Avoid juices with pulp.  No orange juice.  3.  Drink at least 64 oz. (8 glasses) of fluid/clear liquids during the day to prevent dehydration and help the prep work efficiently.  CLEAR LIQUIDS INCLUDE: Water Jello Ice Popsicles Tea (sugar ok, no milk/cream) Powdered fruit flavored drinks Coffee (sugar ok, no milk/cream) Gatorade Juice: apple, white grape, white cranberry  Lemonade Clear bullion, consomm, broth Carbonated beverages (any kind) Strained chicken noodle soup Hard Candy                             4.  In the morning, mix first dose of MoviPrep solution:    Empty 1 Pouch A and 1 Pouch B into the disposable container    Add lukewarm drinking water to the top line of the container. Mix to dissolve    Refrigerate (mixed solution should be used within 24 hrs)  5.  Begin drinking the prep at 5:00 p.m. The MoviPrep container is divided by 4 marks.   Every 15 minutes drink the solution down to the next mark (approximately 8 oz) until the full liter is complete.   6.  Follow completed prep with 16 oz of clear liquid of your choice  (Nothing red or purple).  Continue to drink clear liquids until bedtime.  7.  Before going to bed, mix second dose of MoviPrep solution:    Empty 1 Pouch A and 1 Pouch B into the disposable container    Add lukewarm drinking water to the top line of the container. Mix to dissolve    Refrigerate  THE DAY OF YOUR PROCEDURE      DATE: thursday 3/24  Beginning at 8:30 a.m. (5 hours before procedure):         1. Every 15 minutes, drink the solution down to the next mark (approx 8 oz) until the full liter is complete.  2. Follow completed prep with 16 oz. of clear liquid of your choice.    3. You may drink clear liquids until 11:30 am (2 HOURS BEFORE PROCEDURE).   MEDICATION INSTRUCTIONS  Unless otherwise instructed, you should take regular prescription medications with a small sip of water   as early as possible the morning of  your procedure.  Additional medication instructions: Do not take fluid pill am of procedure         OTHER INSTRUCTIONS  You will need a responsible adult at least 75 years of age to accompany you and drive you home.   This person must remain in the waiting room during your procedure.  Wear loose fitting clothing that is easily removed.  Leave jewelry and other valuables at home.  However, you may wish to bring a book to read or  an iPod/MP3 player to listen to music as you wait for your procedure to start.  Remove all body piercing jewelry and leave at home.  Total time from sign-in until discharge is approximately 2-3 hours.  You should go home directly after your procedure and rest.  You can resume normal activities the  day after your procedure.  The day of your procedure you should not:   Drive   Make legal decisions   Operate machinery   Drink alcohol   Return to work  You will receive specific instructions about eating, activities and medications before you leave.    The above instructions have been reviewed and explained to me  by   Ulis Rias RN  May 04, 2009 1:51 PM     I fully understand and can verbalize these instructions _____________________________ Date _________

## 2010-03-29 NOTE — Assessment & Plan Note (Signed)
Summary: rov for osa   Copy to:  Kriste Basque Primary Provider/Referring Provider:  Kriste Basque  CC:  Pt is here for her yearly f/u appt.  Pt states she is wearing her cpap machine every night.  Pt denied any complaints with mask or pressure.  Pt does c/o coughing up a lot of clear sputum in the mornings and wonders if cpap machine is causing this. .  History of Present Illness: The pt comes in today for f/u of her osa.  She is wearing cpap compliantly, and is having no issues with mask fit or pressure.  She feels that she sleeps well with the device, and denies daytime alertness issues.  She does note postnasal drip with clear mucus production in the am's.    Current Medications (verified): 1)  Cpap 10 .... As Directed 2)  Mucinex Maximum Strength 1200 Mg Xr12h-Tab (Guaifenesin) .... Take 1 Tab By Mouth Two Times A Day W/ Plenty of Fluids.Marland KitchenMarland Kitchen 3)  Baby Aspirin 81 Mg Chew (Aspirin) .... Take 1 Tablet By Mouth Once A Day 4)  Atenolol 100 Mg  Tabs (Atenolol) .... Take 1 Tablet By Mouth Once A Day 5)  Lotrel 5-10 Mg  Caps (Amlodipine Besy-Benazepril Hcl) .... Take 1 Tablet By Mouth Once A Day 6)  Dyazide 37.5-25 Mg  Caps (Triamterene-Hctz) .... Take 1 Tablet By Mouth Once A Day 7)  Potassium Chloride Crys Cr 10 Meq  Tbcr (Potassium Chloride Crys Cr) .... 2 Caps By Mouth Once Daily 8)  Zocor 40 Mg  Tabs (Simvastatin) .... Take 1 Tablet By Mouth Once A Day 9)  Levoxyl 50 Mcg  Tabs (Levothyroxine Sodium) .... Take 1 Tablet By Mouth Once A Day 10)  Allopurinol 100 Mg  Tabs (Allopurinol) .... Take 1 Tablet By Mouth Once A Day 11)  Caltrate 600+d 600-400 Mg-Unit  Tabs (Calcium Carbonate-Vitamin D) .... Take 1 Tablet By Mouth Two Times A Day 12)  Centrum Silver  Tabs (Multiple Vitamins-Minerals) .... Take 1 Tablet By Mouth Once A Day 13)  Dorzolamide-Timolol 2.05 % .Marland Kitchen.. 1 Drop in Each Eye Two Times A Day  Allergies (verified): 1)  ! Pcn 2)  ! * Tcn 3)  ! Norvasc 4)  ! Cardizem 5)  ! * Labetalol 6)  ! *  Benicar 7)  ! * Clonidine 8)  ! Iodine 9)  ! Benicar (Olmesartan Medoxomil) 10)  Catapres-Tts-1 (Clonidine Hcl) 11)  Doxycycline Hyclate (Doxycycline Hyclate) 12)  Labetalol Hcl (Labetalol Hcl) 13)  Penicillin G Potassium (Penicillin G Potassium)  Review of Systems      See HPI  Vital Signs:  Patient profile:   77 year old female Height:      59 inches Weight:      189.25 pounds BMI:     38.36 O2 Sat:      99 % on Room air Temp:     97.8 degrees F oral Pulse rate:   61 / minute BP sitting:   118 / 60  (left arm) Cuff size:   regular  Vitals Entered By: Arman Filter LPN (April 29, 5407 10:13 AM)  O2 Flow:  Room air CC: Pt is here for her yearly f/u appt.  Pt states she is wearing her cpap machine every night.  Pt denied any complaints with mask or pressure.  Pt does c/o coughing up a lot of clear sputum in the mornings and wonders if cpap machine is causing this.  Comments Medications reviewed with patient Arman Filter LPN  April 28, 2009 10:13 AM    Physical Exam  General:  obese female in nad Nose:  no skin breakdown or pressure necrosis from cpap mask Neurologic:  alert, not sleepy moves all 4.   Impression & Recommendations:  Problem # 1:  OBSTRUCTIVE SLEEP APNEA (ICD-327.23)  the pt is doing very well with cpap, and has no issues with tolerance.  She is sleeping well, and is satisfied with her daytime alertness.  I have asked her to work on weight loss, and to make sure she keeps up with maintenance of machine.  I have also gone over nasal hygeine with her, in order to help her postnasal drip.  She can make adjustments to the humidity with the heater on her machine.  Medications Added to Medication List This Visit: 1)  Dorzolamide-timolol 2.05 %  .Marland Kitchen.. 1 drop in each eye two times a day  Other Orders: Est. Patient Level II (40981)  Patient Instructions: 1)  continue with cpap 2)  work on weight loss 3)  can use saline nasal spray at bedtime to clean out  nose before putting on cpap.   4)  can use chlorpheniramine 8mg  over the counter at bedtime to help with postnasal drip 5)  followup with me in 12mos.

## 2010-03-29 NOTE — Letter (Signed)
Summary: Patient Notice- Polyp Results  [facility]  [address] [place], [place] [address]   Phone: [phone]  Fax: [phone]        May 26, 2009 MRN: [id number]    April Donaldson [address] [place], [place]  [address]    Dear Ms. Klunk,  I am pleased to inform you that the colon polyps removed during your recent colonoscopy were found to be benign (no cancer detected) upon pathologic examination.  I recommend you consider having a repeat colonoscopy examination in 5 years to look for recurrent polyps, as having colon polyps increases your risk for having recurrent polyps or even colon cancer in the future. We will need to sit down and discuss this, taking your health status into account at tthat time before proceeding to a colonoscopy.  In addition to repeating colonoscopy, changing health habits may reduce your risk of having more colon polyps and possibly, colon cancer. You may lower your risk of future polyps and colon cancer by adopting healthy habits such as not smoking or using tobacco (if you do), being physically active, losing weight (if overweight), and eating a diet which includes fruits and vegetables and limits red meat.  Should you develop new or worsening symptoms of abdominal pain, bowel habit changes or bleeding from the rectum or bowels, please schedule an evaluation with either your primary care physician or with me.  Please call us if you are having persistent problems or have questions about your condition that have not been fully answered at this time.  Sincerely,  Iva Boop MD, [facility]  This letter has been electronically signed by your physician.  Appended Document: Patient Notice- Polyp Results letter mailed 4.1.11

## 2010-03-29 NOTE — Progress Notes (Signed)
Summary: Colon 3-24 has Questions   Phone Note Call from Patient Call back at Home Phone [phone]   Call For: Dr Leone Payor Summary of Call: A few questions about her meds prior to a Colon next thursday Initial call taken by: Leanor Kail [facility],  May 12, 2009 12:09 PM  Follow-up for Phone Call        pt's questions were answered Follow-up by: Ezra Sites RN,  May 12, 2009 12:32 PM

## 2010-03-29 NOTE — Progress Notes (Signed)
Summary: test  Phone Note Call from Patient   Caller: Patient Call For: nadel Summary of Call: pt calling about test she is suppose to be getting  Initial call taken by: Rickard Patience,  August 27, 2009 9:03 AM  Follow-up for Phone Call        called and spoke with pt and she wants to come in on 7-5 or 7-6 for her labs prior to her appt on 7-7.  please advise. Randell Loop CMA  August 27, 2009 9:11 AM   Additional Follow-up for Phone Call Additional follow up Details #1::        labs are in computer for pt on 7-5 and pt is aware Randell Loop CMA  August 27, 2009 3:41 PM

## 2010-03-29 NOTE — Assessment & Plan Note (Signed)
Summary: 6 months/apc   Primary Care Provider:  Kriste Basque  CC:  6 month ROV & review of mult medical problems....  History of Present Illness: 77 y/o BF here for a 6 month follow up visit... she has multiple medical problems as noted below...    ~  she had a sleep study 8/09 w/ RDI= 20, mild snoring, & desat to 66%- so she was started on CPAP by DrClance, adjusted to 10cm pressure and she reports doing well...  ~  she sees [place] for Cards w/ hx CP & HBP- baseline EKG shows poor R progression & NSSTTWA, non-ischemic Myoview 4/08... he did Dobut Echo 4/10- neg, no regional wall motion abn seen...   ~  she established w/ DrGessner in Feb09 for GI and is due for f/u colon in 2011...   ~  September 17, 2008:  she has multiple somatic complaints "I'm doing alright, I think?"... she's had alot of testing done since her last OV in 1/10... Mammogram 2/10 was neg, BMD 2/10 at [facility] showed improvement in her BMD from 2008... she went to the ER 3/10 w/ atyp CP & ruled out for MI- seen by Walker Kehr w/ neg DobutEcho 4/10 (no change in meds)... she had f/u DrClance 4/10 due to CPAP mask issues- referred for better fit & rec to lose weight... 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 today is is concerned about swelling- exam w/ tr edema... we discussed no salt, elevation, support hose, exercise, & offered stronger diuretic, but she prefers to continue the Dyazide for now...   ~  March 16, 2009:  she contracted URI from grandkids over the holidays- c/o cough, congestion, & yellow mucous>> we discussed Rx w/ ZPak, Mucinex, Fluids... otherw BP has been good, stable on meds, weight down a few lbs... had yearly f/u DrWebb for Nephrology- she has atrophic non-functioning left kidney & Creat stable at 1.1, no changes made... due for CXR, fasting labs today...    Current Problem  List:  GLAUCOMA (ICD-365.9) - followed by DrShapiro on gtts... 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  OBSTRUCTIVE SLEEP APNEA (ICD-780.57) - Hx borderline sleep study in past w/ RDI=12, desat to 87%... increased symptoms of daytime hypersomn resulted in repeat sleep study 8/09 w/ RDI= 20, mild snoring, & desat to 66%- so she was started on CPAP by DrClance, adjusted to 10cm pressure and she reports doing well after mask adjustments...  ~  1/11: she reports difficulty w/ CPAP when "congested"...  HYPERTENSION (ICD-401.9) - controlled on ATENOLOL 100mg /d, LOTREL 5-10 daily, & DYAZIDE 1/d, & KCl - 2daily...  BP= 128/70 today (similar at home)- she takes meds regularly & tolerates well... she tries to follow a low sodium diet and she has tried to count calories and lose wt... denies HA, fatigue, visual changes, CP, palipit, dizziness, syncope, dyspnea, etc...   ~  2DEcho 4/08 was normal w/ EF=55-65%...   ~  NuclearStressTest 4/08 was negative- no ischemia & EF=79% (similiar to 2002)...  ~  Dobutamine Echo 4/10 was neg as well...  ~!  CXR 1/11 showed stable cardiomeg, ectasia of thorAo, incr markings bases, NAD...  CHEST PAIN, ATYPICAL (ICD-786.59) - eval in ER 3/10 w/ follow up by Walker Kehr- neg eval...  CEREBROVASCULAR DISEASE (ICD-437.9) - on ASA 81mg /d... prev CDopplers 6/04 showed mild plaque, 0-39% bilat...  VENOUS INSUFFICIENCY (ICD-459.81) - on low sodium diet, +elevation, +support hose, &  Dyazide...  HYPERCHOLESTEROLEMIA (ICD-272.0) - on SIMVASTATIN 40mg /d...  ~  FLP 7/08 showed TChol 156, TG 118, HDL 55, LDL 78  ~  FLP 1/09 showed TChol 180, TG 158, HDL 55, LDL 94... continue med + diet efforts.  ~  FLP 7/09 showed TChol 159, TG 142, HDL 48, LDL 83... rec- continue same.  ~  FLP 1/10 showed TChol 152, TG 111, HDL 55, LDL 75  ~  FLP 7/10 showed TChol 151, TG 81, HDL 59, LDL 76... continue Simva40.  ~  FLP 1/11 showed TChol 155, TG 148, HDL 48, LDL 77  HYPOTHYROIDISM (ICD-244.9)  - on LEVOXYL 48mcg/d... s/p R thyroid lobectomy 2002 by DrCrossley for cyst...   ~  labs 1/09 on Levoxyl50 showed TSH= 3.10  ~  labs 7/09 on Levoxyl50 showed TSH= 2.34  ~  labs 1/10 showed TSH= 3.78... continue LEVOXYL50  ~  labs 7/10 showed TSH= 2.54  ~  labs 1/11 showed TSH= 2.95  OBESITY (ICD-278.00) -  she knows that she needs to diet, exercise and get her weight down!  ~  weight 1/10 = 193#,  she is 4\' 10"  tall,  BMI= 40...  ~  weight 7/10 = 194#  ~  weight 1/11 = 188#  GERD (ICD-530.81) - she uses OTC Prilosec 20mg  Prn... she saw DrGessner in Feb09- doing satis...  IRRITABLE BOWEL SYNDROME (ICD-564.1) & COLONIC POLYPS (ICD-211.3) - last colonoscopy 3/06 by DrSamLeBauer showed extremely tortuous colon, divertics, sm polyp... f/u 43yrs...  RENAL INSUFFICIENCY (ICD-588.9) -.she has an atrophic non-functioning L Kidney and is followed by DrWebb- last seen 8/09...   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    ~  labs 1/09 showed BUN= 15, Creat= 1.0  ~  labs 7/09 showed BUN= 19. 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 she is seen every 27mo for sonars due to her severe anxiety about these benign lesions...  DEGENERATIVE JOINT DISEASE (ICD-715.90)  LOW BACK PAIN, CHRONIC (ICD-724.2)  GOUT (ICD-274.9) - Uric Acid Jul09 was 8.1 and ALLOPURINOL 100mg /d started by DrWebb...  ~  labs 7/10 showed Uric= 5.8  ~  labs 1/11 showed Uric= 5.7  ANXIETY (ICD-300.00) - she has severe anxiety about her health status, doesn't want nerve pills.  Health Maintenance - her GYN is ?(NP Maurice March)... BMD at SER done 2/ 10 & improved, on  calcium, vits, etc...  ~  labs 7/09 showed Vit D level = 57  ~  labs 7/10 showed Vit D level = 46    Allergies: 1)  ! Pcn 2)  ! * Tcn 3)  ! Norvasc 4)  ! Cardizem 5)  ! * Labetalol 6)  ! * Benicar 7)  ! * Clonidine 8)  ! Iodine 9)  ! Benicar (Olmesartan Medoxomil) 10)  Catapres-Tts-1 (Clonidine Hcl) 11)  Doxycycline Hyclate (Doxycycline Hyclate) 12)  Labetalol Hcl (Labetalol Hcl) 13)  Penicillin G Potassium (Penicillin G Potassium)  Comments:  Nurse/Medical Assistant: The patient's medications and allergies were reviewed with the patient and were updated in the Medication and Allergy Lists.  Past History:  Past Medical History: ALLERGIC RHINITIS  (  ICD-477.9) GLAUCOMA (ICD-365.9) OBSTRUCTIVE SLEEP APNEA (ICD-327.23) HYPERTENSION (ICD-401.9) CHEST PAIN, ATYPICAL (ICD-786.59) CEREBROVASCULAR DISEASE (ICD-437.9) VENOUS INSUFFICIENCY (ICD-459.81) HYPERCHOLESTEROLEMIA (ICD-272.0) HYPOTHYROIDISM (ICD-244.9) OBESITY (ICD-278.00) GERD (ICD-530.81) IRRITABLE BOWEL SYNDROME (ICD-564.1) COLONIC POLYPS (ICD-211.3) RENAL INSUFFICIENCY (ICD-588.9) BENIGN NEOPLASM OF KIDNEY EXCEPT PELVIS (ICD-223.0) DEGENERATIVE JOINT DISEASE (ICD-715.90) LOW BACK PAIN, CHRONIC (ICD-724.2) GOUT (ICD-274.9) ANXIETY (ICD-300.00)  Past Surgical History: S/P Hysterectomy & Ooperectomy in the 1970's S/P R Thyroid Lobectomy for a cyst by DrCrossley 9/02 S/P FESS by DrCrossley 4/05  Family History: Reviewed history from 05/19/2008 and no changes required. asthma: brother, sister heart disease: mother (CHF) cancer: sister (brain), brother (throat)  Social History: Reviewed history from 05/19/2008 and no changes required. pt is retired.  Pt previously worked at an [employer] account [employer].   pt had 1 child who is now deceased. pt is single.  Review of Systems      See HPI       The patient complains of dyspnea on exertion and difficulty walking.  The patient denies anorexia, fever, weight loss, weight  gain, vision loss, decreased hearing, hoarseness, chest pain, syncope, peripheral edema, prolonged cough, headaches, hemoptysis, abdominal pain, melena, hematochezia, severe indigestion/heartburn, hematuria, incontinence, muscle weakness, suspicious skin lesions, depression, unusual weight change, abnormal bleeding, enlarged lymph nodes, and angioedema.    Vital Signs:  Patient profile:   77 year old female Height:      59 inches Weight:      187.50 pounds BMI:     38.01 O2 Sat:      94 % on Room air Temp:     97.4 degrees F oral Pulse rate:   60 / minute BP sitting:   128 / 70  (left arm) Cuff size:   regular  Vitals Entered By: Randell Loop CMA (March 16, 2009 11:42 AM)  O2 Sat at Rest %:  94 O2 Flow:  Room air CC: 6 month ROV & review of mult medical problems... Is Patient Diabetic? No Pain Assessment Patient in pain? no      Comments no changes in meds   Physical Exam  Additional Exam:  WD, Obese, 77 y/o BF in NAD... GENERAL:  Alert & oriented; pleasant & cooperative... HEENT:  [place]/AT, EOM-wnl, PERRLA, EACs-clear, TMs-wnl, NOSE-clear, THROAT-clear & wnl. NECK:  Supple w/ fairROM; no JVD; normal carotid impulses w/o bruits; Thyroid surg scar, no thyromegaly or nodules palpated; no lymphadenopathy. CHEST:  Clear to P & A; without wheezes/ rales/ or rhonchi heard... HEART:  Regular Rhythm; without murmurs/ rubs/ or gallops detected... ABDOMEN:  Obese soft & nontender; normal bowel sounds; no organomegaly or masses palpated... EXT: without deformities, mild arthritic changes; no varicose veins/ venous insuffic/ tr edema. NEURO:  CN's intact; no focal neuro deficits... DERM:  No lesions noted; no rash etc...     Impression & Recommendations:  Problem # 1:  OBSTRUCTIVE SLEEP APNEA (ICD-327.23) Continue CPAP per DrClance... Orders: T-2 View CXR (71020TC)  Problem # 2:  HYPERTENSION (ICD-401.9) Controlled-  same meds. Her updated medication list for this problem  includes:    Atenolol 100 Mg Tabs (Atenolol) .Marland Kitchen... Take 1 tablet by mouth once a day    Lotrel 5-10 Mg Caps (Amlodipine besy-benazepril hcl) .Marland Kitchen... Take 1 tablet by mouth once a day    Dyazide 37.5-25 Mg Caps (Triamterene-hctz) .Marland Kitchen... Take 1 tablet by mouth once a day  Orders: T-2 View CXR (71020TC) Venipuncture (16109) TLB-Lipid Panel (80061-LIPID) TLB-BMP (Basic Metabolic Panel-BMET) (80048-METABOL) TLB-Hepatic/Liver Function Pnl (80076-HEPATIC) TLB-CBC Platelet - w/Differential (85025-CBCD) TLB-TSH (Thyroid Stimulating Hormone) (  84443-TSH) TLB-Uric Acid, Blood (84550-URIC)  Problem # 3:  HYPERCHOLESTEROLEMIA (ICD-272.0) Stable on the simva40... keep same. Her updated medication list for this problem includes:    Zocor 40 Mg Tabs (Simvastatin) .Marland Kitchen... Take 1 tablet by mouth once a day  Problem # 4:  HYPOTHYROIDISM (ICD-244.9) Stable on Levoxyl50... keep same. Her updated medication list for this problem includes:    Levoxyl 50 Mcg Tabs (Levothyroxine sodium) .Marland Kitchen... Take 1 tablet by mouth once a day  Problem # 5:  OBESITY (ICD-278.00) Diet + exercise are the keys...  Problem # 6:  RENAL INSUFFICIENCY (ICD-588.9) Renal function is normal...  Problem # 7:  DEGENERATIVE JOINT DISEASE (ICD-715.90) Stable-  caution w/ NSAIDs... Her updated medication list for this problem includes:    Baby Aspirin 81 Mg Chew (Aspirin) .Marland Kitchen... Take 1 tablet by mouth once a day  Problem # 8:  ANXIETY (ICD-300.00) She does not desire med Rx...  Complete Medication List: 1)  Travatan 0.004 % Soln (Travoprost) .... Use one drop in each eye at bedtime 2)  Combigan 0.2-0.5 % Soln (Brimonidine tartrate-timolol) .... Use one drop in each eye two times a day 3)  Cpap 10  .... As directed 4)  Mucinex Maximum Strength 1200 Mg Xr12h-tab (Guaifenesin) .... Take 1 tab by mouth two times a day w/ plenty of fluids.Marland KitchenMarland Kitchen 5)  Baby Aspirin 81 Mg Chew (Aspirin) .... Take 1 tablet by mouth once a day 6)  Atenolol 100 Mg  Tabs (Atenolol) .... Take 1 tablet by mouth once a day 7)  Lotrel 5-10 Mg Caps (Amlodipine besy-benazepril hcl) .... Take 1 tablet by mouth once a day 8)  Dyazide 37.5-25 Mg Caps (Triamterene-hctz) .... Take 1 tablet by mouth once a day 9)  Potassium Chloride Crys Cr 10 Meq Tbcr (Potassium chloride crys cr) .... 2 caps by mouth once daily 10)  Zocor 40 Mg Tabs (Simvastatin) .... Take 1 tablet by mouth once a day 11)  Levoxyl 50 Mcg Tabs (Levothyroxine sodium) .... Take 1 tablet by mouth once a day 12)  Allopurinol 100 Mg Tabs (Allopurinol) .... Take 1 tablet by mouth once a day 13)  Caltrate 600+d 600-400 Mg-unit Tabs (Calcium carbonate-vitamin d) .... Take 1 tablet by mouth two times a day 14)  Centrum Silver Tabs (Multiple vitamins-minerals) .... Take 1 tablet by mouth once a day 15)  Zithromax Z-pak 250 Mg Tabs (Azithromycin) .... Take as directed...  Other Orders: Prescription Created Electronically [phone]) Pneumococcal Vaccine (60454) Admin 1st Vaccine (09811)  Patient Instructions: 1)  Today we updated your med list- see below.... 2)  For your Bronchitis:  we wrote a new perscription for a ZPak for the infection;  get the [facility] MAX- one tab twice daily & drink plenty of fluids for the congestion;  you may use DELSYM cough syrup OTC if necessary- 2 tsp twice daily as needed... 3)  Today we did your follow up CXR & FASTING blood work... please call the "phone tree" in a few days for your lab results.Marland KitchenMarland Kitchen 4)  Let's get on track w/ our diet + exercise program>> the goal is to get the weight down!!! 5)  We gave you the PNEUMONIA vaccine today... 6)  Call for any questions.Marland KitchenMarland Kitchen 7)  Please schedule a follow-up appointment in 6 months. Prescriptions: ZITHROMAX Z-PAK 250 MG TABS (AZITHROMYCIN) take as directed...  #1 pack x 2   Entered and Authorized by:   Michele Mcalpine MD   Signed by:   Michele Mcalpine MD on  03/16/2009   Method used:   Print then Give to Patient   RxID:    [id number]    Immunizations Administered:  Pneumonia Vaccine:    Vaccine Type: Pneumovax    Site: right deltoid    Mfr: Merck    Dose: 0.5 ml    Route: IM    Given by: Randell Loop CMA    Exp. Date: 03/25/2010    Lot #: [id number]    VIS given: 09/25/95 version given March 16, 2009.

## 2010-03-29 NOTE — Assessment & Plan Note (Signed)
Summary: 6 MONTH FOLLOW UP/PT HERE AT 2:30PM/[place]   Primary Care Provider:  Kriste Basque  CC:  6 month ROV & review of mult medical problems....  History of Present Illness: 77 y/o BF here for a 6 month follow up visit... she has multiple medical problems as noted below... 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   ~  September 17, 2008:  she has multiple somatic complaints "I'm doing alright, I think?"... she's had alot of testing done since her last OV in 1/10... Mammogram 2/10 was neg, BMD 2/10 at [facility] showed improvement in her BMD from 2008... she went to the ER 3/10 w/ atyp CP & ruled out for MI- seen by Walker Kehr w/ neg DobutEcho 4/10 (no change in meds)... she had f/u DrClance 4/10 due to CPAP mask issues- referred for better fit & rec to lose weight... she saw DrBorden, Urology 4/10 for reassurance regarding her angiomyolipoma and renal cysts- ultrasound w/o change, & pt insists on Q44mo ultrasounds due to her severe anxiety... today is is concerned about swelling- exam w/ tr edema... we discussed no salt, elevation, support hose, exercise, & offered stronger diuretic, but she prefers to continue the Dyazide for now...   ~  March 16, 2009:  she contracted URI from grandkids over the holidays- c/o cough, congestion, & yellow mucous>> we discussed Rx w/ ZPak, Mucinex, Fluids... otherw BP has been good, stable on meds, weight down a few lbs... had yearly f/u DrWebb for Nephrology- she has atrophic non-functioning left kidney & Creat stable at 1.1, no changes made... due for CXR, fasting labs today...   ~  September 02, 2009:  she continues to see her mult specialists for reassurance regarding her mult somatic complaints>  saw DrClance 3/11 for yearly sleep f/u- doing well,  asked to lose weight!... saw DrGessner 3/11 w/ colonoscopy showing divertics, stenosis in sigmoid, hems, & 2 sm polyps= adenoma fragments, f/u 39yrs... saw DrBorden 3/11 for 5mo f/u angiomyolipoma right kidney- no change on sonar, reassured, f/u 5mo per pt request for on-going reassurance... she has noted some ankle edema- hasn't lost any weight, BP controlled w/ meds & Dyazide> rec to elim sodium, elevate, wear support hose...   Current Problem List:  GLAUCOMA (ICD-365.9) - followed by DrShapiro on gtts... 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  OBSTRUCTIVE SLEEP APNEA (ICD-780.57) - Hx borderline sleep study in past w/ RDI=12, desat to 87%... increased symptoms of daytime hypersomn resulted in repeat sleep study 8/09 w/ RDI= 20, mild snoring, & desat to 66%- so she was started on CPAP by DrClance, adjusted to 10cm pressure and she reports doing well after mask adjustments...  ~  1/11: she reports difficulty w/ CPAP when "congested".  ~  3/11:  yearly f/u DrClance- doing well, no changes made.  HYPERTENSION (ICD-401.9) - controlled on ATENOLOL 100mg /d, LOTREL 5-10 daily, & DYAZIDE 1/d, & KCl - 2daily...  BP= 120/82 today (similar at home)- she takes meds regularly & tolerates well... she tries to follow a low sodium diet and she has tried to count calories and lose wt... denies HA, fatigue, visual changes, CP, palipit, dizziness, syncope, dyspnea, etc...   ~  2DEcho 4/08 was normal w/ EF=55-65%...   ~  NuclearStressTest 4/08 was negative- no ischemia & EF=79% (similiar to 2002)...  ~  Dobutamine Echo 4/10 was neg as well...  ~  CXR 1/11 showed stable cardiomeg, ectasia of thorAo, incr markings bases, NAD...  CHEST PAIN, ATYPICAL (ICD-786.59) - eval in ER 3/10 w/ follow up by Walker Kehr- neg eval...  CEREBROVASCULAR DISEASE (ICD-437.9) - on ASA 81mg /d... prev CDopplers 6/04 showed mild plaque, 0-39% bilat...  VENOUS INSUFFICIENCY (ICD-459.81) - on low sodium diet, +elevation, +support hose,  & Dyazide...  HYPERCHOLESTEROLEMIA (ICD-272.0) - on SIMVASTATIN 40mg /d + diet efforts...  ~  FLP 7/08 showed TChol 156, TG 118, HDL 55, LDL 78  ~  FLP 7/09 showed TChol 159, TG 142, HDL 48, LDL 83... rec- continue same.  ~  FLP 1/10 showed TChol 152, TG 111, HDL 55, LDL 75  ~  FLP 7/10 showed TChol 151, TG 81, HDL 59, LDL 76... continue Simva40.  ~  FLP 1/11 showed TChol 155, TG 148, HDL 48, LDL 77  ~  FLP 7/11 showed TChol 147, TG 103, HDL 56, LDL 71... continue Simva40.  HYPOTHYROIDISM (ICD-244.9) - on LEVOXYL 25mcg/d... s/p R thyroid lobectomy 2002 by DrCrossley for cyst...   ~  labs 1/09 on Levoxyl50 showed TSH= 3.10  ~  labs 7/09 on Levoxyl50 showed TSH= 2.34  ~  labs 1/10 showed TSH= 3.78... continue LEVOXYL50  ~  labs 7/10 showed TSH= 2.54  ~  labs 1/11 showed TSH= 2.95  ~  labs 7/11 showed TSH= 2.64  OBESITY (ICD-278.00) -  she knows that she needs to diet, exercise and get her weight down!  ~  weight 1/10 = 193#,  she is 4\' 10"  tall,  BMI= 40...  ~  weight 7/10 = 194#  ~  weight 1/11 = 188#  ~  weight 7/11 = 191#  GERD (ICD-530.81) - she uses OTC Prilosec 20mg  Prn... she saw DrGessner in Feb09- doing satis...  IRRITABLE BOWEL SYNDROME (ICD-564.1) & COLONIC POLYPS (ICD-211.3) - colonoscopy 3/06 by DrSamLeBauer showed extremely tortuous colon, divertics, sm polyp... f/u 39yrs...  ~  colonoscopy 3/11 by DrGessner showed divertics, stenosis in sigmoid, hems, & 2 sm polyps= adenoma fragments, f/u 75yrs.  RENAL INSUFFICIENCY (ICD-588.9) -.she has an atrophic non-functioning L Kidney and is followed by DrWebb- last seen 8/09...   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    ~  labs 1/09 showed BUN= 15, Creat= 1.0  ~  labs 7/09 showed BUN= 19. Creat= 1.1  ~  labs 1/10 showed BUN= 18, Creat= 1.0  ~  labs 7/10 showed BUN= 17, Creat= 1.1  ~  labs 1/11 showed BUN= 16, Creat= 1.0  ~  labs  7/11 showed BUN= 17, Creat= 1.0  BENIGN NEOPLASM OF KIDNEY EXCEPT PELVIS (ICD-223.0) - followed by DrBorden for angiomyolipoma (7mm right renal lesion- AML confirmed by MRI & followed by ultrasound exams) & renal cysts... she is seen every 43mo for sonars due to her severe anxiety about these benign lesions...  ~  4/11:  routine 43mo f/u drBorden w/ sonar> no change in 7mm AML right kidney.  DEGENERATIVE JOINT DISEASE (ICD-715.90)  LOW BACK PAIN, CHRONIC (ICD-724.2)  GOUT (ICD-274.9) - Uric Acid Jul09 was 8.1 and ALLOPURINOL 100mg /d started by DrWebb...  ~  labs 7/10 showed Uric= 5.8  ~  labs 1/11 showed Uric= 5.7  ANXIETY (ICD-300.00) - she has severe anxiety about her health status, doesn't want nerve pills.  Health Maintenance - her GYN is ?(NP Maurice March)... BMD at [facility] done 2/ 10 &  improved, on calcium, vits, etc...  ~  labs 7/09 showed Vit D level = 57  ~  labs 7/10 showed Vit D level = 46  ~  labs 7/11 showed Vit D level = 68   Preventive Screening-Counseling & Management  Alcohol-Tobacco     Smoking Status: never  Allergies: 1)  ! Pcn 2)  ! * Tcn 3)  ! Norvasc 4)  ! Cardizem 5)  ! * Labetalol 6)  ! * Benicar 7)  ! * Clonidine 8)  ! Iodine 9)  ! Benicar (Olmesartan Medoxomil) 10)  Catapres-Tts-1 (Clonidine Hcl) 11)  Doxycycline Hyclate (Doxycycline Hyclate) 12)  Labetalol Hcl (Labetalol Hcl) 13)  Penicillin G Potassium (Penicillin G Potassium)  Comments:  Nurse/Medical Assistant: The patient's medications and allergies were reviewed with the patient and were updated in the Medication and Allergy Lists.  Past History:  Past Medical History: GLAUCOMA (ICD-365.9) OBSTRUCTIVE SLEEP APNEA (ICD-327.23) HYPERTENSION (ICD-401.9) CHEST PAIN, ATYPICAL (ICD-786.59) CEREBROVASCULAR DISEASE (ICD-437.9) VENOUS INSUFFICIENCY (ICD-459.81) HYPERCHOLESTEROLEMIA (ICD-272.0) HYPOTHYROIDISM (ICD-244.9) OBESITY (ICD-278.00) GERD (ICD-530.81) IRRITABLE BOWEL SYNDROME  (ICD-564.1) COLONIC POLYPS (ICD-211.3) RENAL INSUFFICIENCY (ICD-588.9) BENIGN NEOPLASM OF KIDNEY EXCEPT PELVIS (ICD-223.0) DEGENERATIVE JOINT DISEASE (ICD-715.90) LOW BACK PAIN, CHRONIC (ICD-724.2) GOUT (ICD-274.9) ANXIETY (ICD-300.00)  Past Surgical History: S/P Hysterectomy & Ooperectomy in the 1970's S/P R Thyroid Lobectomy for a cyst by DrCrossley 9/02 S/P FESS by DrCrossley 4/05  Family History: Reviewed history from 05/19/2008 and no changes required. asthma: brother, sister heart disease: mother (CHF) cancer: sister (brain), brother (throat)  Social History: Reviewed history from 05/19/2008 and no changes required. pt is retired.  Pt previously worked at an [employer] account [employer].   pt had 1 child who is now deceased. pt is single  Review of Systems      See HPI       The patient complains of dyspnea on exertion.  The patient denies anorexia, fever, weight loss, weight gain, vision loss, decreased hearing, hoarseness, chest pain, syncope, peripheral edema, prolonged cough, headaches, hemoptysis, abdominal pain, melena, hematochezia, severe indigestion/heartburn, hematuria, incontinence, muscle weakness, suspicious skin lesions, transient blindness, difficulty walking, depression, unusual weight change, abnormal bleeding, enlarged lymph nodes, and angioedema.         She is quite anxious...   Vital Signs:  Patient profile:   77 year old female Height:      59 inches Weight:      191 pounds BMI:     38.72 O2 Sat:      98 % on Room air Temp:     97.7 degrees F oral Pulse rate:   60 / minute BP sitting:   120 / 82  (right arm) Cuff size:   regular  Vitals Entered By: Randell Loop CMA (September 02, 2009 2:22 PM)  O2 Sat at Rest %:  98 O2 Flow:  Room air CC: 6 month ROV & review of mult medical problems... Is Patient Diabetic? No Pain Assessment Patient in pain? no      Comments no changes in meds today   Physical Exam  Additional Exam:  WD, Obese, 77 y/o BF in  NAD... GENERAL:  Alert & oriented; pleasant & cooperative... HEENT:  [place]/AT, EOM-wnl, PERRLA, EACs-clear, TMs-wnl, NOSE-clear, THROAT-clear & wnl. NECK:  Supple w/ fairROM; no JVD; normal carotid impulses w/o bruits; Thyroid surg scar, no thyromegaly or nodules palpated; no lymphadenopathy. CHEST:  Clear to P & A; without wheezes/ rales/ or rhonchi heard... HEART:  Regular Rhythm; without murmurs/ rubs/ or gallops detected... ABDOMEN:  Obese  soft & nontender; normal bowel sounds; no organomegaly or masses palpated... EXT: without deformities, mild arthritic changes; no varicose veins/ venous insuffic/ tr edema. NEURO:  CN's intact; no focal neuro deficits... DERM:  No lesions noted; no rash etc...    MISC. Report  Procedure date:  08/31/2009  Findings:      BMP (METABOL)   Sodium                    140 mEq/L                   135-145   Potassium                 3.9 mEq/L                   3.5-5.1   Chloride                  106 mEq/L                   96-112   Carbon Dioxide            27 mEq/L                    19-32   Glucose                   86 mg/dL                    16-10   BUN                       17 mg/dL                    9-60   Creatinine                1.0 mg/dL                   4.5-4.0   Calcium                   9.1 mg/dL                   9.8-11.9   GFR                       72.74 mL/min                >60  Lipid Panel (LIPID)   Cholesterol               147 mg/dL                   1-478   Triglycerides             103.0 mg/dL                 2.9-562.1   HDL                       30.86 mg/dL                 >57.84   LDL Cholesterol           71 mg/dL                    6-96  Hepatic/Liver Function Panel (HEPATIC)   Total Bilirubin  0.5 mg/dL                   1.6-1.0   Direct Bilirubin          0.1 mg/dL                   9.6-0.4   Alkaline Phosphatase      46 U/L                      39-117   AST                       21 U/L                       0-37   ALT                       19 U/L                      0-35   Total Protein             7.0 g/dL                    5.4-0.9   Albumin                   4.0 g/dL                    8.1-1.9  TSH (TSH)   FastTSH                   2.64 uIU/mL                 0.35-5.50  Vitamin D (25-Hydroxy) (14782)  Vitamin D (25-Hydroxy)                             68 ng/mL                    30-89   Impression & Recommendations:  Problem # 1:  OBSTRUCTIVE SLEEP APNEA (ICD-327.23) Stable-  continue CPAP but desperately needs to lose weight...  Problem # 2:  HYPERTENSION (ICD-401.9) BP controlled>  same meds, get weight down, no salt etc... Her updated medication list for this problem includes:    Atenolol 100 Mg Tabs (Atenolol) .Marland Kitchen... Take 1 tablet by mouth once a day    Lotrel 5-10 Mg Caps (Amlodipine besy-benazepril hcl) .Marland Kitchen... Take 1 tablet by mouth once a day    Dyazide 37.5-25 Mg Caps (Triamterene-hctz) .Marland Kitchen... Take 1 tablet by mouth once a day  Problem # 3:  HYPERCHOLESTEROLEMIA (ICD-272.0) FLP stable on the Simva40... further improvement will require weight loss & better diet effort. Her updated medication list for this problem includes:    Zocor 40 Mg Tabs (Simvastatin) .Marland Kitchen... Take 1 tablet by mouth once a day  Problem # 4:  HYPOTHYROIDISM (ICD-244.9) Stable on this dose of Levothy... continue same. Her updated medication list for this problem includes:    Levoxyl 50 Mcg Tabs (Levothyroxine sodium) .Marland Kitchen... Take 1 tablet by mouth once a day  Problem # 5:  OBESITY (ICD-278.00) Diet + exercise are the keys to success... we discussed diet necessary for weight reduction...  Problem # 6:  COLONIC POLYPS (ICD-211.3) Recent f/u colon reviewed w/ pt... followed by Rodena Medin  now.  Problem # 7:  BENIGN NEOPLASM OF KIDNEY EXCEPT PELVIS (ICD-223.0) Renal is stable>  non functioning left kidney x yrs related to remote GYN surg... renal function remains normal... followed by drBorden for bilat renal  cysts and 7mm AML right kidney w/ hi anxiety... pt is reassured.  Problem # 8:  ANXIETY (ICD-300.00) She is reassured... she declines anxiolytic Rx...  Complete Medication List: 1)  Cpap 10  .... As directed 2)  Mucinex Maximum Strength 1200 Mg Xr12h-tab (Guaifenesin) .... Take 1 tab by mouth two times a day w/ plenty of fluids.Marland KitchenMarland Kitchen 3)  Baby Aspirin 81 Mg Chew (Aspirin) .... Take 1 tablet by mouth once a day 4)  Atenolol 100 Mg Tabs (Atenolol) .... Take 1 tablet by mouth once a day 5)  Lotrel 5-10 Mg Caps (Amlodipine besy-benazepril hcl) .... Take 1 tablet by mouth once a day 6)  Dyazide 37.5-25 Mg Caps (Triamterene-hctz) .... Take 1 tablet by mouth once a day 7)  Potassium Chloride Crys Cr 10 Meq Tbcr (Potassium chloride crys cr) .... 2 caps by mouth once daily 8)  Zocor 40 Mg Tabs (Simvastatin) .... Take 1 tablet by mouth once a day 9)  Levoxyl 50 Mcg Tabs (Levothyroxine sodium) .... Take 1 tablet by mouth once a day 10)  Allopurinol 100 Mg Tabs (Allopurinol) .... Take 1 tablet by mouth once a day 11)  Caltrate 600+d 600-400 Mg-unit Tabs (Calcium carbonate-vitamin d) .... Take 1 tablet by mouth two times a day 12)  Centrum Silver Tabs (Multiple vitamins-minerals) .... Take 1 tablet by mouth once a day 13)  Dorzolamide-timolol 2.05 %  .Marland Kitchen.. 1 drop in each eye two times a day 14)  Xalatan 0.005 % Soln (Latanoprost) .Marland Kitchen.. 1 drop right at bedtime  Patient Instructions: 1)  Today we updated your med list- see below.... 2)  Continue your current meds the same for now... 3)  Call for any problems.Marland KitchenMarland Kitchen 4)  Please schedule a follow-up appointment in 6 months.

## 2010-03-29 NOTE — Assessment & Plan Note (Signed)
Summary: FLU SHOT ///KP   Nurse Visit   Allergies: 1)  ! Pcn 2)  ! * Tcn 3)  ! Norvasc 4)  ! Cardizem 5)  ! * Labetalol 6)  ! * Benicar 7)  ! * Clonidine 8)  ! Iodine 9)  ! Benicar (Olmesartan Medoxomil) 10)  Catapres-Tts-1 (Clonidine Hcl) 11)  Doxycycline Hyclate (Doxycycline Hyclate) 12)  Labetalol Hcl (Labetalol Hcl) 13)  Penicillin G Potassium (Penicillin G Potassium)  Orders Added: 1)  Flu Vaccine 67yrs + MEDICARE PATIENTS [Q2039] 2)  Administration Flu vaccine - MCR [G0008] Flu Vaccine Consent Questions     Do you have a history of severe allergic reactions to this vaccine? no    Any prior history of allergic reactions to egg and/or gelatin? no    Do you have a sensitivity to the preservative Thimersol? no    Do you have a past history of Guillan-Barre Syndrome? no    Do you currently have an acute febrile illness? no    Have you ever had a severe reaction to latex? no    Vaccine information given and explained to patient? yes    Are you currently pregnant? no    Lot Number:[id number]   Exp Date:08/27/2010   Site Given  Left Deltoid IM  Tammy Scott  November 29, 2009 10:06 AM ]  Appended Document: FLU SHOT ///KP Correct flu shot lot# [id number]

## 2010-03-29 NOTE — Letter (Signed)
Summary: [facility]   Imported By: Sherian Rein 01/08/2010 11:22:41  _____________________________________________________________________  External Attachment:    Type:   Image     Comment:   External Document

## 2010-03-29 NOTE — Assessment & Plan Note (Signed)
Summary: Acute NP office visit - edema in ankles   Copy to:  Kriste Basque Primary Provider/Referring Provider:  Kriste Basque  CC:  swelling in both ankles/feet.  History of Present Illness: 77 y/o BF  with known hx of HTN and OSA    ~  she had a sleep study 8/09 w/ RDI= 20, mild snoring, & desat to 66%- so she was started on CPAP by DrClance, adjusted to 10cm pressure and she reports doing well...  ~  she sees [place] for Cards w/ hx CP & HBP- baseline EKG shows poor R progression & NSSTTWA, non-ischemic Myoview 4/08... he did Dobut Echo 4/10- neg, no regional wall motion abn seen...   ~  she established w/ DrGessner in Feb09 for GI and is due for f/u colon in 2011...   ~  September 17, 2008:  she has multiple somatic complaints "I'm doing alright, I think?"...  . 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 today is is concerned about swelling- exam w/ tr edema... we discussed no salt, elevation, support hose, exercise, & offered stronger diuretic, but she prefers to continue the Dyazide for now...   ~  March 16, 2009:  she contracted URI from grandkids over the holidays- c/o cough, congestion, & yellow mucous>> we discussed Rx w/ ZPak, Mucinex, Fluids... otherw BP has been good, stable on meds, weight down a few lbs... had yearly f/u DrWebb for Nephrology- she has atrophic non-functioning left kidney & Creat stable at 1.1, no changes made... due for CXR, fasting labs today...  Jun 28, 2009--Presents for an acute office viist. Complains of swelling in both ankles/feet. She has a hx of venous insufficiency w/ edema w/ recommendations in past for salt reductions/support stockings/leg elevations along w/ dyazide. She denies calf pain, known injury, rash, fever or redness. Some pain at night. Denies chest pain, dyspnea, orthopnea, hemoptysis, fever, n/v/d,  headache. Edema resolves  with elevation.     ~  March 16, 2009:  she contracted URI from grandkids over the holidays- c/o cough, congestion, & yellow mucous>> we discussed Rx w/ ZPak, Mucinex, Fluids... otherw BP has been good, stable on meds, weight down a few lbs... had yearly f/u DrWebb for Nephrology- she has atrophic non-functioning left kidney & Creat stable at 1.1, no changes made... due for CXR, fasting labs today...    Current Problem List:  GLAUCOMA (ICD-365.9) - followed by DrShapiro on gtts... 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  OBSTRUCTIVE SLEEP APNEA (ICD-780.57) - Hx borderline sleep study in past w/ RDI=12, desat to 87%... increased symptoms of daytime hypersomn resulted in repeat sleep study 8/09 w/ RDI= 20, mild snoring, & desat to 66%- so she was started on CPAP by DrClance, adjusted to 10cm pressure and she reports doing well after mask adjustments...  ~  1/11: she reports difficulty w/ CPAP when "congested"...  HYPERTENSION (ICD-401.9) - controlled on ATENOLOL 100mg /d, LOTREL 5-10 daily, & DYAZIDE 1/d, & KCl - 2daily...  BP= 128/70 today (similar at home)- she takes meds regularly & tolerates well... she tries to follow a low sodium diet and she has tried to count calories and lose wt... denies HA, fatigue, visual changes, CP, palipit, dizziness, syncope, dyspnea, etc...   ~  2DEcho 4/08 was normal w/ EF=55-65%...   ~  NuclearStressTest 4/08 was negative- no ischemia & EF=79% (similiar to 2002)...  ~  Dobutamine Echo 4/10 was  neg as well...  ~!  CXR 1/11 showed stable cardiomeg, ectasia of thorAo, incr markings bases, NAD...  CHEST PAIN, ATYPICAL (ICD-786.59) - eval in ER 3/10 w/ follow up by Walker Kehr- neg eval...  CEREBROVASCULAR DISEASE (ICD-437.9) - on ASA 81mg /d... prev CDopplers 6/04 showed mild plaque, 0-39% bilat...  VENOUS INSUFFICIENCY (ICD-459.81) - on low sodium diet, +elevation, +support hose, & Dyazide...  HYPERCswelling in both ankles/feet, left is more pronounced and  occ pain in legs at night during sleep x2weeks - pt denies any redness, pain  Medications Prior to Update: 1)  Cpap 10 .... As Directed 2)  Mucinex Maximum Strength 1200 Mg Xr12h-Tab (Guaifenesin) .... Take 1 Tab By Mouth Two Times A Day W/ Plenty of Fluids.Marland KitchenMarland Kitchen 3)  Baby Aspirin 81 Mg Chew (Aspirin) .... Take 1 Tablet By Mouth Once A Day 4)  Atenolol 100 Mg  Tabs (Atenolol) .... Take 1 Tablet By Mouth Once A Day 5)  Lotrel 5-10 Mg  Caps (Amlodipine Besy-Benazepril Hcl) .... Take 1 Tablet By Mouth Once A Day 6)  Dyazide 37.5-25 Mg  Caps (Triamterene-Hctz) .... Take 1 Tablet By Mouth Once A Day 7)  Potassium Chloride Crys Cr 10 Meq  Tbcr (Potassium Chloride Crys Cr) .... 2 Caps By Mouth Once Daily 8)  Zocor 40 Mg  Tabs (Simvastatin) .... Take 1 Tablet By Mouth Once A Day 9)  Levoxyl 50 Mcg  Tabs (Levothyroxine Sodium) .... Take 1 Tablet By Mouth Once A Day 10)  Allopurinol 100 Mg  Tabs (Allopurinol) .... Take 1 Tablet By Mouth Once A Day 11)  Caltrate 600+d 600-400 Mg-Unit  Tabs (Calcium Carbonate-Vitamin D) .... Take 1 Tablet By Mouth Two Times A Day 12)  Centrum Silver  Tabs (Multiple Vitamins-Minerals) .... Take 1 Tablet By Mouth Once A Day 13)  Dorzolamide-Timolol 2.05 % .Marland Kitchen.. 1 Drop in Each Eye Two Times A Day  Current Medications (verified): 1)  Cpap 10 .... As Directed 2)  Mucinex Maximum Strength 1200 Mg Xr12h-Tab (Guaifenesin) .... Take 1 Tab By Mouth Two Times A Day W/ Plenty of Fluids.Marland KitchenMarland Kitchen 3)  Baby Aspirin 81 Mg Chew (Aspirin) .... Take 1 Tablet By Mouth Once A Day 4)  Atenolol 100 Mg  Tabs (Atenolol) .... Take 1 Tablet By Mouth Once A Day 5)  Lotrel 5-10 Mg  Caps (Amlodipine Besy-Benazepril Hcl) .... Take 1 Tablet By Mouth Once A Day 6)  Dyazide 37.5-25 Mg  Caps (Triamterene-Hctz) .... Take 1 Tablet By Mouth Once A Day 7)  Potassium Chloride Crys Cr 10 Meq  Tbcr (Potassium Chloride Crys Cr) .... 2 Caps By Mouth Once Daily 8)  Zocor 40 Mg  Tabs (Simvastatin) .... Take 1 Tablet By Mouth  Once A Day 9)  Levoxyl 50 Mcg  Tabs (Levothyroxine Sodium) .... Take 1 Tablet By Mouth Once A Day 10)  Allopurinol 100 Mg  Tabs (Allopurinol) .... Take 1 Tablet By Mouth Once A Day 11)  Caltrate 600+d 600-400 Mg-Unit  Tabs (Calcium Carbonate-Vitamin D) .... Take 1 Tablet By Mouth Two Times A Day 12)  Centrum Silver  Tabs (Multiple Vitamins-Minerals) .... Take 1 Tablet By Mouth Once A Day 13)  Dorzolamide-Timolol 2.05 % .Marland Kitchen.. 1 Drop in Each Eye Two Times A Day 14)  Xalatan 0.005 % Soln (Latanoprost) .Marland Kitchen.. 1 Drop Right At Bedtime  Allergies (verified): 1)  ! Pcn 2)  ! * Tcn 3)  ! Norvasc 4)  ! Cardizem 5)  ! * Labetalol 6)  ! * Benicar 7)  ! *  Clonidine 8)  ! Iodine 9)  ! Benicar (Olmesartan Medoxomil) 10)  Catapres-Tts-1 (Clonidine Hcl) 11)  Doxycycline Hyclate (Doxycycline Hyclate) 12)  Labetalol Hcl (Labetalol Hcl) 13)  Penicillin G Potassium (Penicillin G Potassium)  Past History:  Past Surgical History: Last updated: 03/16/2009 S/P Hysterectomy & Ooperectomy in the 1970's S/P R Thyroid Lobectomy for a cyst by DrCrossley 9/02 S/P FESS by DrCrossley 4/05  Family History: Last updated: 05/19/2008 asthma: brother, sister heart disease: mother (CHF) cancer: sister (brain), brother (throat)  Social History: Last updated: 05/19/2008 pt is retired.  Pt previously worked at an [employer] account [employer].   pt had 1 child who is now deceased. pt is single.  Risk Factors: Smoking Status: never (03/20/2008)  Past Medical History: GLAUCOMA (ICD-365.9) - followed by DrShapiro on gtts... 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  OBSTRUCTIVE SLEEP APNEA (ICD-780.57) - Hx borderline sleep study in past w/ RDI=12, desat to 87%... increased symptoms of daytime hypersomn resulted in repeat sleep study 8/09 w/ RDI= 20, mild snoring, & desat to 66%- so she was started on CPAP by DrClance, adjusted to 10cm pressure and she reports doing well after mask adjustments...  ~  1/11: she reports difficulty w/  CPAP when "congested"...  HYPERTENSION (ICD-401.9) - controlled on ATENOLOL 100mg /d, LOTREL 5-10 daily, & DYAZIDE 1/d, & KCl - 2daily...  BP= 128/70 today (similar at home)- she takes meds regularly & tolerates well... she tries to follow a low sodium diet and she has tried to count calories and lose wt... denies HA, fatigue, visual changes, CP, palipit, dizziness, syncope, dyspnea, etc...   ~  2DEcho 4/08 was normal w/ EF=55-65%...   ~  NuclearStressTest 4/08 was negative- no ischemia & EF=79% (similiar to 2002)...  ~  Dobutamine Echo 4/10 was neg as well...  ~!  CXR 1/11 showed stable cardiomeg, ectasia of thorAo, incr markings bases, NAD...  CHEST PAIN, ATYPICAL (ICD-786.59) - eval in ER 3/10 w/ follow up by Walker Kehr- neg eval...  CEREBROVASCULAR DISEASE (ICD-437.9) - on ASA 81mg /d... prev CDopplers 6/04 showed mild plaque, 0-39% bilat...  VENOUS INSUFFICIENCY (ICD-459.81) - on low sodium diet, +elevation, +support hose, & Dyazide...  HYPERCswelling in both ankles/feet, left is more pronounced and occ pain in legs at night during sleep x2weeks - pt denies any redness, pain  Review of Systems      See HPI  Vital Signs:  Patient profile:   77 year old female Height:      59 inches Weight:      192.50 pounds BMI:     39.02 O2 Sat:      99 % on Room air Temp:     97.9 degrees F oral Pulse rate:   60 / minute BP sitting:   116 / 76  (left arm) Cuff size:   regular  Vitals Entered By: Boone Master CNA (Jun 28, 2009 11:21 AM)  O2 Flow:  Room air CC: swelling in both ankles/feet Is Patient Diabetic? No Comments Medications reviewed with patient Daytime contact number verified with patient. Boone Master CNA  Jun 28, 2009 11:23 AM    Physical Exam  Additional Exam:  GEN: A/Ox3; pleasant , NAD HEENT:  [place]/AT, , EACs-clear, TMs-wnl, NOSE-clear, THROAT-clear NECK:  Supple w/ fair ROM; no JVD; normal carotid impulses w/o bruits; no thyromegaly or nodules palpated; no  lymphadenopathy. RESP  Clear to P & A; w/o, wheezes/ rales/ or rhonchi. CARD:  RRR, no m/r/g   GI:   Soft & nt; nml bowel  sounds; no organomegaly or masses detected. Musco: Warm bil,  no calf tenderness clubbing, pulses intact, minimal edema noted, neg homans sign.  Neuro: intact w/ no focal deficits noted.    Impression & Recommendations:  Problem # 1:  VENOUS INSUFFICIENCY (ICD-459.81)  VI w/ edema .  REC:  Low salt diet , Exercise, weight loss Keep legs elevated as much as possible.  Support stockings daily , remove at bedtime.  Please contact office for sooner follow up if symptoms do not improve or worsen  follow up Dr. Kriste Basque in 2 months as scheduled.   Orders: Est. Patient Level III (09811)  Medications Added to Medication List This Visit: 1)  Xalatan 0.005 % Soln (Latanoprost) .Marland Kitchen.. 1 drop right at bedtime  Complete Medication List: 1)  Cpap 10  .... As directed 2)  Mucinex Maximum Strength 1200 Mg Xr12h-tab (Guaifenesin) .... Take 1 tab by mouth two times a day w/ plenty of fluids.Marland KitchenMarland Kitchen 3)  Baby Aspirin 81 Mg Chew (Aspirin) .... Take 1 tablet by mouth once a day 4)  Atenolol 100 Mg Tabs (Atenolol) .... Take 1 tablet by mouth once a day 5)  Lotrel 5-10 Mg Caps (Amlodipine besy-benazepril hcl) .... Take 1 tablet by mouth once a day 6)  Dyazide 37.5-25 Mg Caps (Triamterene-hctz) .... Take 1 tablet by mouth once a day 7)  Potassium Chloride Crys Cr 10 Meq Tbcr (Potassium chloride crys cr) .... 2 caps by mouth once daily 8)  Zocor 40 Mg Tabs (Simvastatin) .... Take 1 tablet by mouth once a day 9)  Levoxyl 50 Mcg Tabs (Levothyroxine sodium) .... Take 1 tablet by mouth once a day 10)  Allopurinol 100 Mg Tabs (Allopurinol) .... Take 1 tablet by mouth once a day 11)  Caltrate 600+d 600-400 Mg-unit Tabs (Calcium carbonate-vitamin d) .... Take 1 tablet by mouth two times a day 12)  Centrum Silver Tabs (Multiple vitamins-minerals) .... Take 1 tablet by mouth once a day 13)   Dorzolamide-timolol 2.05 %  .Marland Kitchen.. 1 drop in each eye two times a day 14)  Xalatan 0.005 % Soln (Latanoprost) .Marland Kitchen.. 1 drop right at bedtime  Patient Instructions: 1)  Low salt diet , 2)  Exercise, weight loss 3)  Keep legs elevated as much as possible.  4)  Support stockings daily , remove at bedtime.  5)  Please contact office for sooner follow up if symptoms do not improve or worsen  6)  follow up Dr. Kriste Basque in 2 months as scheduled.

## 2010-03-29 NOTE — Letter (Signed)
Summary: [facility]  [facility]   Imported By: Lester [place] 03/02/2009 10:27:40  _____________________________________________________________________  External Attachment:    Type:   Image     Comment:   External Document

## 2010-03-29 NOTE — Letter (Signed)
Summary: [facility]  [facility]   Imported By: Sherian Rein 07/02/2009 09:32:34  _____________________________________________________________________  External Attachment:    Type:   Image     Comment:   External Document

## 2010-03-29 NOTE — Progress Notes (Signed)
Summary: prescript  Phone Note Call from Patient   Caller: Patient Call For: nadel Summary of Call: need prescripts sent to [facility] gave them to triage Initial call taken by: Rickard Patience,  October 14, 2009 1:57 PM  Follow-up for Phone Call        rx have been faxed to [facility] per pts request Randell Loop CMA  October 14, 2009 2:20 PM     Prescriptions: ZOCOR 40 MG  TABS (SIMVASTATIN) Take 1 tablet by mouth once a day  #90 x 3   Entered by:   Randell Loop CMA   Authorized by:   Michele Mcalpine MD   Signed by:   Randell Loop CMA on 10/14/2009   Method used:   Faxed to ...       [facility]* (retail)             ,          Ph: [phone]       Fax: [phone]   RxID:   [id number] LOTREL 5-10 MG  CAPS (AMLODIPINE BESY-BENAZEPRIL HCL) Take 1 tablet by mouth once a day Brand medically necessary #90 x 3   Entered by:   Randell Loop CMA   Authorized by:   Michele Mcalpine MD   Signed by:   Randell Loop CMA on 10/14/2009   Method used:   Faxed to ...       [facility]* (retail)             ,          Ph: [phone]       Fax: [phone]   RxID:   [id number] LEVOXYL 50 MCG  TABS (LEVOTHYROXINE SODIUM) Take 1 tablet by mouth once a day  #90 x 3   Entered by:   Randell Loop CMA   Authorized by:   Michele Mcalpine MD   Signed by:   Randell Loop CMA on 10/14/2009   Method used:   Faxed to ...       [facility]* (retail)             ,          Ph: [phone]       Fax: [phone]   RxID:   [id number] ATENOLOL 100 MG  TABS (ATENOLOL) Take 1 tablet by mouth once a day  #90 x 3   Entered by:   Randell Loop CMA   Authorized by:   Michele Mcalpine MD   Signed by:   Randell Loop CMA on 10/14/2009   Method used:   Faxed to ...       [facility]* (retail)             ,          Ph: [phone]       Fax: [phone]   RxID:   [id number]

## 2010-03-29 NOTE — Letter (Signed)
Summary: Colonoscopy Letter  [facility]  [address] [place], [place] [address]   Phone: [phone]  Fax: [phone]      April 23, 2009 MRN: [id number]   April Donaldson [address] [place], [place]  [address]   Dear [name],   According to your medical record, it is time for you to schedule a Colonoscopy. The American Cancer Society recommends this procedure as a method to detect early colon cancer. Patients with a family history of colon cancer, or a personal history of colon polyps or inflammatory bowel disease are at increased risk.  This letter has beeen generated based on the recommendations made at the time of your procedure. If you feel that in your particular situation this may no longer apply, please contact our office.  Please call our office at [phone] to schedule this appointment or to update your records at your earliest convenience.  Thank you for cooperating with [place] to provide you with the very best care possible.   Sincerely,  Iva Boop, M.D.  [facility] [phone]

## 2010-03-31 NOTE — Assessment & Plan Note (Signed)
Summary: rov 6 months///kp   Primary Care Provider:  Kriste Basque  CC:  6 month ROV & review of mult medical problems....  History of Present Illness: 77 y/o BF here for a 6 month follow up visit... she has multiple medical problems as noted below... 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   ~  Jan11:  she contracted URI from grandkids over the holidays- c/o cough, congestion, & yellow mucous>> we discussed Rx w/ ZPak, Mucinex, Fluids... otherw BP has been good, stable on meds, weight down a few lbs... had yearly f/u DrWebb for Nephrology- she has atrophic non-functioning left kidney & Creat stable at 1.1, no changes made... due for CXR, fasting labs today...  ~  Jul11:  she continues to see her mult specialists for reassurance regarding her mult somatic complaints>  saw DrClance 3/11 for yearly sleep f/u- doing well, asked to lose weight!... saw DrGessner 3/11 w/ colonoscopy showing divertics, stenosis in sigmoid, hems, & 2 sm polyps= adenoma fragments, f/u 34yrs... saw DrBorden 3/11 for 24mo f/u angiomyolipoma right kidney- no change on sonar, reassured, f/u 24mo per pt request for on-going reassurance... she has noted some ankle edema- hasn't lost any weight, BP controlled w/ meds & Dyazide> rec to elim sodium, elevate, wear support hose...   ~  March 08, 2010:  24mo ROV- generally stable, wonders if she can stop any meds, and concerned about spot behind ear= benign SK & advised Derm for excision... notes some mild tinnitus & offered ENT eval but she declines at this time> we discussed "white noise" masking... BP remains under good control;  Chol looks good on Simva40;  Thyroid stable on Levoxyl;  weight down 5# to 186#;  GI is stable;  she had f/u DrBorden 11/11 w/ aother  sonar= no change AML or cyst;  she tells me she's not taking the Allopurinol every day & no gout attacks;  she is seeing a chiropractor, DrGibson, for LBP- s/p "spinal decompression" & improved...    Current Problem List:  GLAUCOMA (ICD-365.9) - followed by DrShapiro on gtts... 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  OBSTRUCTIVE SLEEP APNEA (ICD-780.57) - Hx borderline sleep study in past w/ RDI=12, desat to 87%... increased symptoms of daytime hypersomn resulted in repeat sleep study 8/09 w/ RDI= 20, mild snoring, & desat to 66%- so she was started on CPAP by DrClance, adjusted to 10cm pressure and she reports doing well after mask adjustments...  ~  1/11: she reports difficulty w/ CPAP when "congested".  ~  3/11:  yearly f/u DrClance- doing well, no changes made.  HYPERTENSION (ICD-401.9) - controlled on ATENOLOL 100mg /d, LOTREL 5-10 daily, & DYAZIDE 1/d, & KCl - 2daily...  BP= 124/84 today (similar at home)- she takes meds regularly & tolerates well... she tries to follow a low sodium diet and she has tried to count calories and lose wt... denies HA, fatigue, visual changes, CP, palipit, dizziness, syncope, dyspnea, etc...   ~  2DEcho 4/08 was normal w/ EF=55-65%...   ~  NuclearStressTest 4/08 was negative- no ischemia & EF=79% (similiar to 2002)...  ~  Dobutamine Echo 4/10 was neg as well...  ~  CXR 1/11 showed stable cardiomeg, ectasia of thorAo, incr markings bases, NAD...  CHEST PAIN, ATYPICAL (ICD-786.59) - she went to the ER 3/10 w/ atyp  CP & ruled out for MI- seen by Walker Kehr w/ neg DobutEcho 4/10 (no change in meds)...  CEREBROVASCULAR DISEASE (ICD-437.9) - on ASA 81mg /d... prev CDopplers 6/04 showed mild plaque, 0-39% bilat...  VENOUS INSUFFICIENCY (ICD-459.81) - on low sodium diet, +elevation, +support hose, & Dyazide...  HYPERCHOLESTEROLEMIA (ICD-272.0) - on SIMVASTATIN 40mg /d + diet efforts...  ~  FLP 7/08 showed TChol 156, TG 118, HDL 55, LDL 78  ~  FLP 7/09 showed  TChol 159, TG 142, HDL 48, LDL 83... rec- continue same.  ~  FLP 1/10 showed TChol 152, TG 111, HDL 55, LDL 75  ~  FLP 7/10 showed TChol 151, TG 81, HDL 59, LDL 76... continue Simva40.  ~  FLP 1/11 showed TChol 155, TG 148, HDL 48, LDL 77  ~  FLP 7/11 showed TChol 147, TG 103, HDL 56, LDL 71... continue Simva40.  ~  FLP 1/12 showed TChol 162, TG 110, HDL 60, LDL 80  HYPOTHYROIDISM (ICD-244.9) - on LEVOXYL 73mcg/d... s/p R thyroid lobectomy 2002 by DrCrossley for cyst...   ~  labs 1/09 on Levoxyl50 showed TSH= 3.10  ~  labs 7/09 on Levoxyl50 showed TSH= 2.34  ~  labs 1/10 showed TSH= 3.78... continue LEVOXYL50  ~  labs 7/10 showed TSH= 2.54  ~  labs 1/11 showed TSH= 2.95  ~  labs 7/11 showed TSH= 2.64  ~  labs 1/12 showed TSH= 3.59  OBESITY (ICD-278.00) -  she knows that she needs to diet, exercise and get her weight down!  ~  weight 1/10 = 193#,  she is 4\' 10"  tall,  BMI= 40...  ~  weight 7/10 = 194#  ~  weight 1/11 = 188#  ~  weight 7/11 = 191#  ~  weight 1/12 = 186#  GERD (ICD-530.81) - she uses OTC Prilosec 20mg  Prn... she saw DrGessner in Feb09- doing satis...  IRRITABLE BOWEL SYNDROME (ICD-564.1) & COLONIC POLYPS (ICD-211.3) - colonoscopy 3/06 by DrSamLeBauer showed extremely tortuous colon, divertics, sm polyp... f/u 62yrs...  ~  colonoscopy 3/11 by DrGessner showed divertics, stenosis in sigmoid, hems, & 2 sm polyps= adenoma fragments, f/u 28yrs.  RENAL INSUFFICIENCY (ICD-588.9) -.she has an atrophic non-functioning L Kidney and is followed by DrWebb- last seen 8/09...   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    ~  labs 1/09 showed BUN= 15, Creat= 1.0  ~  labs 1/10 showed BUN= 18, Creat= 1.0  ~  labs 1/11 showed BUN= 16, Creat= 1.0  ~  labs 1/12 showed BUN= 19, Creat= 0.9   BENIGN NEOPLASM OF KIDNEY EXCEPT PELVIS (ICD-223.0) - followed by DrBorden for angiomyolipoma (7mm right  renal lesion- AML confirmed by MRI & followed by ultrasound exams) & renal cysts... she is seen every 51mo for sonars due to her severe anxiety about these benign lesions...  ~  11/11:  routine 51mo f/u DrBorden w/ sonar> no change in 7mm AML right kidney.  DEGENERATIVE JOINT DISEASE (ICD-715.90) - she sees DrHilts for Ortho... LOW BACK PAIN, CHRONIC (ICD-724.2) - she tells me she is seeing a chiropractor, DrGibson, for "spinal decompression" & improved  GOUT (ICD-274.9) - Uric Acid Jul09 was 8.1 and ALLOPURINOL 100mg /d started by DrWebb...  ~  labs 7/10 showed Uric= 5.8  ~  labs 1/11 showed Uric= 5.7  ~  labs 1/12 showed Uric= 6.1  ANXIETY (ICD-300.00) - she has severe anxiety about her health status, doesn't want nerve pills.  Health Maintenance - her GYN is ?(NP Maurice March)... BMD at SER done 2/ 10 & improved, on calcium, vits, etc...  ~  labs 7/09 showed Vit D level = 57  ~  labs 7/10 showed Vit D level = 46  ~  labs 7/11 showed Vit D level = 68   Preventive Screening-Counseling & Management  Alcohol-Tobacco     Smoking Status: never  Allergies: 1)  ! Pcn 2)  ! * Tcn 3)  ! Norvasc 4)  ! Cardizem 5)  ! * Labetalol 6)  ! * Benicar 7)  ! * Clonidine 8)  ! Iodine 9)  ! Benicar (Olmesartan Medoxomil) 10)  Catapres-Tts-1 (Clonidine Hcl) 11)  Doxycycline Hyclate (Doxycycline Hyclate) 12)  Labetalol Hcl (Labetalol Hcl) 13)  Penicillin G Potassium (Penicillin G Potassium)  Comments:  Nurse/Medical Assistant: The patient's medications and allergies were reviewed with the patient and were updated in the Medication and Allergy Lists.  Past History:  Past Medical History: GLAUCOMA (ICD-365.9) OBSTRUCTIVE SLEEP APNEA (ICD-327.23) HYPERTENSION (ICD-401.9) CHEST PAIN, ATYPICAL (ICD-786.59) CEREBROVASCULAR DISEASE (ICD-437.9) VENOUS INSUFFICIENCY (ICD-459.81) HYPERCHOLESTEROLEMIA (ICD-272.0) HYPOTHYROIDISM (ICD-244.9) OBESITY (ICD-278.00) GERD (ICD-530.81) IRRITABLE BOWEL SYNDROME  (ICD-564.1) COLONIC POLYPS (ICD-211.3) RENAL INSUFFICIENCY (ICD-588.9) BENIGN NEOPLASM OF KIDNEY EXCEPT PELVIS (ICD-223.0) DEGENERATIVE JOINT DISEASE (ICD-715.90) LOW BACK PAIN, CHRONIC (ICD-724.2) GOUT (ICD-274.9) ANXIETY (ICD-300.00)  Past Surgical History: S/P Hysterectomy & Ooperectomy in the 1970's S/P R Thyroid Lobectomy for a cyst by DrCrossley 9/02 S/P FESS by DrCrossley 4/05  Family History: Reviewed history from 09/02/2009 and no changes required. asthma: brother, sister heart disease: mother (CHF) cancer: sister (brain), brother (throat)  Social History: Reviewed history from 09/02/2009 and no changes required. pt is retired.  Pt previously worked at an [employer] account [employer].   pt had 1 child who is now deceased. pt is single  Review of Systems      See HPI       The patient complains of dyspnea on exertion.  The patient denies anorexia, fever, weight loss, weight gain, vision loss, decreased hearing, hoarseness, chest pain, syncope, peripheral edema, prolonged cough, headaches, hemoptysis, abdominal pain, melena, hematochezia, severe indigestion/heartburn, hematuria, incontinence, muscle weakness, suspicious skin lesions, transient blindness, difficulty walking, depression, unusual weight change, abnormal bleeding, enlarged lymph nodes, and angioedema.    Vital Signs:  Patient profile:   77 year old female Height:      59 inches Weight:      185.38 pounds O2 Sat:      98 % on Room air Temp:     97 degrees F oral Pulse rate:   60 / minute BP sitting:   124 / 84  (right arm) Cuff size:   regular  Vitals Entered By: Randell Loop CMA (March 08, 2010 9:32 AM)  O2 Sat at Rest %:  98 O2 Flow:  Room air CC: 6 month ROV & review of mult medical problems... Is Patient Diabetic? No Pain Assessment Patient in pain? no      Comments meds updated today with pt   Physical Exam  Additional Exam:  WD, Obese, 78 y/o BF in NAD... GENERAL:  Alert & oriented; pleasant &  cooperative... HEENT:  [place]/AT, EOM-wnl, PERRLA, EACs-clear, TMs-wnl, NOSE-clear, THROAT-clear & wnl. NECK:  Supple w/ fairROM; no JVD; normal carotid impulses w/o bruits; Thyroid surg scar, no thyromegaly or nodules palpated; no lymphadenopathy. CHEST:  Clear to P & A; without wheezes/ rales/ or rhonchi heard... HEART:  Regular Rhythm; without murmurs/ rubs/ or gallops detected... ABDOMEN:  Obese soft & nontender;  normal bowel sounds; no organomegaly or masses palpated... EXT: without deformities, mild arthritic changes; no varicose veins/ venous insuffic/ tr edema. NEURO:  CN's intact; no focal neuro deficits... DERM:  No lesions noted; no rash etc...    MISC. Report  Procedure date:  03/08/2010  Findings:      BMP (METABOL)   Sodium                    142 mEq/L                   135-145   Potassium                 4.2 mEq/L                   3.5-5.1   Chloride                  105 mEq/L                   96-112   Carbon Dioxide            31 mEq/L                    19-32   Glucose                   86 mg/dL                    01-02   BUN                       19 mg/dL                    7-25   Creatinine                0.9 mg/dL                   3.6-6.4   Calcium                   9.7 mg/dL                   4.0-34.7   GFR                       83.60 mL/min                >60.00  Hepatic/Liver Function Panel (HEPATIC)   Total Bilirubin           0.6 mg/dL                   4.2-5.9   Direct Bilirubin          0.1 mg/dL                   5.6-3.8   Alkaline Phosphatase      50 U/L                      39-117   AST                       23 U/L                      0-37   ALT  22 U/L                      0-35   Total Protein             7.4 g/dL                    1.6-1.0   Albumin                   4.0 g/dL                    9.6-0.4  CBC Platelet w/Diff (CBCD)   White Cell Count          9.0 K/uL                    4.5-10.5   Red Cell Count            4.34  Mil/uL                 3.87-5.11   Hemoglobin                13.2 g/dL                   54.0-98.1   Hematocrit                39.0 %                      36.0-46.0   MCV                       89.8 fl                     78.0-100.0   Platelet Count            276.0 K/uL                  150.0-400.0   Neutrophil %         [L]  35.4 %                      43.0-77.0   Lymphocyte %         [H]  49.2 %                      12.0-46.0   Monocyte %                9.3 %                       3.0-12.0   Eosinophils%         [H]  5.4 %                       0.0-5.0   Basophils %               0.7 %                       0.0-3.0  Comments:      Lipid Panel (LIPID)   Cholesterol               162 mg/dL                   1-914   Triglycerides  110.0 mg/dL                 1.6-109.6   HDL                       04.54 mg/dL                 >09.81   LDL Cholesterol           80 mg/dL                    1-91  TSH (TSH)   FastTSH                   3.59 uIU/mL                 0.35-5.50  Uric Acid (URIC)   Uric Acid                 6.1 mg/dL                   4.7-8.2   Impression & Recommendations:  Problem # 1:  OBSTRUCTIVE SLEEP APNEA (ICD-327.23) Stable>  continue same, get on diet, get wt down!  Problem # 2:  HYPERTENSION (ICD-401.9) Controlled>  same meds. Her updated medication list for this problem includes:    Atenolol 100 Mg Tabs (Atenolol) .Marland Kitchen... Take 1 tablet by mouth once a day    Lotrel 5-10 Mg Caps (Amlodipine besy-benazepril hcl) .Marland Kitchen... Take 1 tablet by mouth once a day    Dyazide 37.5-25 Mg Caps (Triamterene-hctz) .Marland Kitchen... Take 1 tablet by mouth once a day  Orders: TLB-BMP (Basic Metabolic Panel-BMET) (80048-METABOL) TLB-Hepatic/Liver Function Pnl (80076-HEPATIC) TLB-CBC Platelet - w/Differential (85025-CBCD) TLB-Lipid Panel (80061-LIPID) TLB-TSH (Thyroid Stimulating Hormone) (84443-TSH) TLB-Uric Acid, Blood (84550-URIC)  Problem # 3:  HYPERCHOLESTEROLEMIA (ICD-272.0) FLP  stable on Simva40... Her updated medication list for this problem includes:    Zocor 40 Mg Tabs (Simvastatin) .Marland Kitchen... Take 1 tablet by mouth once a day  Problem # 4:  HYPOTHYROIDISM (ICD-244.9) TFTs stable on Levo50... Her updated medication list for this problem includes:    Levoxyl 50 Mcg Tabs (Levothyroxine sodium) .Marland Kitchen... Take 1 tablet by mouth once a day  Problem # 5:  OBESITY (ICD-278.00) Diet + exercise discussed w/ pt (again)...  Problem # 6:  COLONIC POLYPS (ICD-211.3) GI stable & up to date per DrGessner...  Problem # 7:  RENAL INSUFFICIENCY (ICD-588.9) Renal stable & Creat WNL.Marland KitchenMarland Kitchen followed by DrBorden for AML, cyst & her anxiety...  Problem # 8:  LOW BACK PAIN, CHRONIC (ICD-724.2) She tells me she is improved w/ Rx from her chiropractor... Her updated medication list for this problem includes:    Baby Aspirin 81 Mg Chew (Aspirin) .Marland Kitchen... Take 1 tablet by mouth once a day  Problem # 9:  ANXIETY (ICD-300.00) She declines offers for anxiolytic med rx...  Complete Medication List: 1)  Cpap 10  .... As directed 2)  Mucinex Maximum Strength 1200 Mg Xr12h-tab (Guaifenesin) .... Take 1 tab by mouth two times a day w/ plenty of fluids.Marland KitchenMarland Kitchen 3)  Baby Aspirin 81 Mg Chew (Aspirin) .... Take 1 tablet by mouth once a day 4)  Atenolol 100 Mg Tabs (Atenolol) .... Take 1 tablet by mouth once a day 5)  Lotrel 5-10 Mg Caps (Amlodipine besy-benazepril hcl) .... Take 1 tablet by mouth once a day 6)  Dyazide 37.5-25 Mg Caps (Triamterene-hctz) .... Take 1 tablet by mouth once a day  7)  Potassium Chloride Crys Cr 10 Meq Tbcr (Potassium chloride crys cr) .Marland Kitchen.. 1 caps by mouth two times a day 8)  Zocor 40 Mg Tabs (Simvastatin) .... Take 1 tablet by mouth once a day 9)  Levoxyl 50 Mcg Tabs (Levothyroxine sodium) .... Take 1 tablet by mouth once a day 10)  Allopurinol 100 Mg Tabs (Allopurinol) .... Take 1 tablet by mouth once a day 11)  Caltrate 600+d 600-400 Mg-unit Tabs (Calcium carbonate-vitamin d) ....  Take 1 tablet by mouth two times a day 12)  Centrum Silver Tabs (Multiple vitamins-minerals) .... Take 1 tablet by mouth once a day 13)  Dorzolamide-timolol 2.05 %  .Marland Kitchen.. 1 drop in each eye two times a day 14)  Xalatan 0.005 % Soln (Latanoprost) .Marland Kitchen.. 1 drop right at bedtime  Patient Instructions: 1)  Today we updated your med list- see below.... 2)  Continue your current meds the same for now... 3)  Today we did your follow up FASTING blood work... please call the "phone tree" in a few days for your lab results.Marland KitchenMarland Kitchen 4)  Let's get on track w/ our diet & exercise program, try to lose 10-15 lbs over the next 6 mo... 5)  Call for any questions.Marland KitchenMarland Kitchen 6)  Please schedule a follow-up appointment in 6 months.

## 2010-04-27 ENCOUNTER — Ambulatory Visit (INDEPENDENT_AMBULATORY_CARE_PROVIDER_SITE_OTHER): Payer: Medicare Other | Admitting: Pulmonary Disease

## 2010-04-27 ENCOUNTER — Encounter: Payer: Self-pay | Admitting: Pulmonary Disease

## 2010-04-27 DIAGNOSIS — G4733 Obstructive sleep apnea (adult) (pediatric): Secondary | ICD-10-CM

## 2010-04-28 ENCOUNTER — Encounter: Payer: Self-pay | Admitting: Pulmonary Disease

## 2010-05-05 NOTE — Assessment & Plan Note (Signed)
Summary: rov for osa   Visit Type:  Follow-up Copy to:  Kriste Basque Primary Provider/Referring Provider:  Kriste Basque  CC:  OSA.  Pt doing well on CPAP...wearing avg 6 hours every night...may be time for a new mask.  History of Present Illness: the pt comes in today for f/u of her known osa.  She is wearing cpap compliantly, and feels she is sleeping better with adequate daytime alertness.  She denies any issues with mask fit or pressure.  She has lost 3 pounds since her last visit.  Her only complaint is postnasal drip, with collection of mucus in her throat on arising in am's.    Preventive Screening-Counseling & Management  Alcohol-Tobacco     Smoking Status: never  Current Medications (verified): 1)  Cpap 10 .... As Directed 2)  Mucinex Maximum Strength 1200 Mg Xr12h-Tab (Guaifenesin) .... Take 1 Tab By Mouth Two Times A Day W/ Plenty of Fluids.Marland Kitchenas Needed 3)  Baby Aspirin 81 Mg Chew (Aspirin) .... Take 1 Tablet By Mouth Once A Day 4)  Atenolol 100 Mg  Tabs (Atenolol) .... Take 1 Tablet By Mouth Once A Day 5)  Lotrel 5-10 Mg  Caps (Amlodipine Besy-Benazepril Hcl) .... Take 1 Tablet By Mouth Once A Day 6)  Dyazide 37.5-25 Mg  Caps (Triamterene-Hctz) .... Take 1 Tablet By Mouth Once A Day 7)  Potassium Chloride Crys Cr 10 Meq  Tbcr (Potassium Chloride Crys Cr) .Marland Kitchen.. 1 Caps By Mouth Two Times A Day 8)  Zocor 40 Mg  Tabs (Simvastatin) .... Take 1 Tablet By Mouth Once A Day 9)  Levoxyl 50 Mcg  Tabs (Levothyroxine Sodium) .... Take 1 Tablet By Mouth Once A Day 10)  Allopurinol 100 Mg  Tabs (Allopurinol) .... Take 1 Tablet By Mouth Once A Day 11)  Caltrate 600+d 600-400 Mg-Unit  Tabs (Calcium Carbonate-Vitamin D) .... Take 1 Tablet By Mouth Two Times A Day 12)  Centrum Silver  Tabs (Multiple Vitamins-Minerals) .... Take 1 Tablet By Mouth Once A Day 13)  Dorzolamide-Timolol 2.05 % .Marland Kitchen.. 1 Drop in Each Eye Two Times A Day 14)  Xalatan 0.005 % Soln (Latanoprost) .Marland Kitchen.. 1 Drop Right At Bedtime  Allergies  (verified): 1)  ! Pcn 2)  ! * Tcn 3)  ! Norvasc 4)  ! Cardizem 5)  ! * Labetalol 6)  ! * Benicar 7)  ! * Clonidine 8)  ! Iodine 9)  ! Benicar (Olmesartan Medoxomil) 10)  Catapres-Tts-1 (Clonidine Hcl) 11)  Doxycycline Hyclate (Doxycycline Hyclate) 12)  Labetalol Hcl (Labetalol Hcl) 13)  Penicillin G Potassium (Penicillin G Potassium)  Review of Systems       The patient complains of difficulty swallowing and hand/feet swelling.  The patient denies shortness of breath with activity, shortness of breath at rest, productive cough, non-productive cough, coughing up blood, chest pain, irregular heartbeats, acid heartburn, indigestion, loss of appetite, weight change, abdominal pain, sore throat, tooth/dental problems, headaches, nasal congestion/difficulty breathing through nose, sneezing, itching, ear ache, anxiety, depression, joint stiffness or pain, rash, change in color of mucus, and fever.    Vital Signs:  Patient profile:   77 year old female Height:      59 inches (149.86 cm) Weight:      186.13 pounds (84.60 kg) BMI:     37.73 O2 Sat:      98 % on Room air Temp:     97.9 degrees F (36.61 degrees C) oral Pulse rate:   66 / minute BP sitting:  98 / 64  (left arm) Cuff size:   large  Vitals Entered By: Michel Bickers CMA (April 27, 2010 10:46 AM)  O2 Sat at Rest %:  98 O2 Flow:  Room air CC: OSA.  Pt doing well on CPAP...wearing avg 6 hours every night...may be time for a new mask Comments Medications reviewed with patient Michel Bickers CMA  April 27, 2010 10:47 AM   Physical Exam  General:  obese female in nad  Nose:  no  skin breakdown or pressure necrosis from cpap mask Extremities:  mild LE edema, no cyanosis  Neurologic:  alert, does not appear sleepy, moves all 4 .   Impression & Recommendations:  Problem # 1:  OBSTRUCTIVE SLEEP APNEA (ICD-327.23)  the pt is doing well with cpap, and has no complaints wrt tolerance of pressure or mask.  She feels she is  benefitting from treatment, and is trying to work on weight loss.  I have asked her to keep up with machine maintenance, and also mask changes.  She will followup with me in one year.  Medications Added to Medication List This Visit: 1)  Mucinex Maximum Strength 1200 Mg Xr12h-tab (Guaifenesin) .... Take 1 tab by mouth two times a day w/ plenty of fluids.Marland Kitchenas needed  Other Orders: Est. Patient Level III (04540)  Patient Instructions: 1)  continue to work on weight reduction 2)  keep up with mask changes 3)  adjust heater on humidifier to help with postnasal drip, but can also try zyrtec 10mg  at bedtime to see if it will help 4)  followup with me in one year

## 2010-05-20 ENCOUNTER — Other Ambulatory Visit: Payer: Self-pay | Admitting: Pulmonary Disease

## 2010-05-27 ENCOUNTER — Encounter: Payer: Self-pay | Admitting: Pulmonary Disease

## 2010-06-02 ENCOUNTER — Telehealth: Payer: Self-pay | Admitting: Pulmonary Disease

## 2010-06-02 NOTE — Telephone Encounter (Signed)
Pt c/o feeling "wobbly" on her feet x 2-3 days. She just returned from a trip to her granddaughters in [place], [place]. She said this may just be fatigue from her trip but wanted to check with SN. She does c/o some allergy symptoms, such as, slight nasal congestion, sinus pressure at times. She denies any SOB, dizziness or cough. Pls advise with any recs.

## 2010-06-02 NOTE — Telephone Encounter (Signed)
Per SN----needs to rest at home, use zyrtec otc prn for allergy symptoms, saline for the nasal congestion and pressure and give this med some time to work.  All of her symptoms could contribute to the allergies and wobbly feeling she is having.  thanks

## 2010-06-02 NOTE — Telephone Encounter (Signed)
Called and spoke w/ pt and advised of SN recs. Pt states she will try this and if not feeling better will call back on Monday

## 2010-06-09 LAB — DIFFERENTIAL
Basophils Absolute: 0 10*3/uL (ref 0.0–0.1)
Basophils Relative: 0 % (ref 0–1)
Eosinophils Absolute: 0.4 K/uL (ref 0.0–0.7)
Eosinophils Relative: 3 % (ref 0–5)
Lymphocytes Relative: 19 % (ref 12–46)
Lymphs Abs: 2.9 K/uL (ref 0.7–4.0)
Monocytes Absolute: 0.9 10*3/uL (ref 0.1–1.0)
Monocytes Relative: 6 % (ref 3–12)
Neutro Abs: 11.2 K/uL — ABNORMAL HIGH (ref 1.7–7.7)
Neutrophils Relative %: 73 % (ref 43–77)

## 2010-06-09 LAB — LIPASE, BLOOD: Lipase: 27 U/L (ref 11–59)

## 2010-06-09 LAB — COMPREHENSIVE METABOLIC PANEL
ALT: 25 U/L (ref 0–35)
AST: 31 U/L (ref 0–37)
Albumin: 4.1 g/dL (ref 3.5–5.2)
Alkaline Phosphatase: 59 U/L (ref 39–117)
CO2: 30 mEq/L (ref 19–32)
Chloride: 101 mEq/L (ref 96–112)
GFR calc non Af Amer: 54 mL/min — ABNORMAL LOW (ref >60)
Potassium: 3.8 mEq/L (ref 3.5–5.1)
Sodium: 138 mEq/L (ref 135–145)
Total Bilirubin: 0.6 mg/dL (ref 0.3–1.2)

## 2010-06-09 LAB — COMPREHENSIVE METABOLIC PANEL WITH GFR
BUN: 12 mg/dL (ref 6–23)
Calcium: 9.9 mg/dL (ref 8.4–10.5)
Creatinine, Ser: 1 mg/dL (ref 0.4–1.2)
Glucose, Bld: 123 mg/dL — ABNORMAL HIGH (ref 70–99)
Total Protein: 7.6 g/dL (ref 6.0–8.3)

## 2010-06-09 LAB — CBC
HCT: 41.5 % (ref 36.0–46.0)
Hemoglobin: 13.9 g/dL (ref 12.0–15.0)
MCHC: 33.6 g/dL (ref 30.0–36.0)
MCV: 90.2 fL (ref 78.0–100.0)
Platelets: 256 10*3/uL (ref 150–400)
RBC: 4.6 MIL/uL (ref 3.87–5.11)
RDW: 14.2 % (ref 11.5–15.5)
WBC: 15.3 10*3/uL — ABNORMAL HIGH (ref 4.0–10.5)

## 2010-06-09 LAB — POCT CARDIAC MARKERS
CKMB, poc: 2.1 ng/mL (ref 1.0–8.0)
Myoglobin, poc: 87.4 ng/mL (ref 12–200)
Troponin i, poc: <0.05 ng/mL (ref 0.00–0.09)

## 2010-06-09 LAB — CK TOTAL AND CKMB (NOT AT ARMC)
CK, MB: 2.2 ng/mL (ref 0.3–4.0)
Relative Index: 1.2 (ref 0.0–2.5)
Total CK: 190 U/L — ABNORMAL HIGH (ref 7–177)

## 2010-06-09 LAB — TROPONIN I

## 2010-06-24 ENCOUNTER — Other Ambulatory Visit: Payer: Self-pay | Admitting: *Deleted

## 2010-06-24 MED ORDER — TRIAMTERENE-HCTZ 37.5-25 MG PO CAPS: 1.0000 | ORAL_CAPSULE | ORAL | Status: DC

## 2010-07-12 NOTE — Op Note (Signed)
NAMEFREDERICK, April Donaldson                ACCOUNT NO.:  [id number]   MEDICAL RECORD NO.:  [id number]          PATIENT TYPE:  AMB   LOCATION:  [facility]                          FACILITY:  [facility]   PHYSICIAN:  Artist Pais. 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   POSTOPERATIVE DIAGNOSIS:  Left hand volar mass between the index and  long metacarpal phalangeal joints.   PROCEDURE:  Excisional biopsy of above.   SURGEON:  Artist Pais. Mina Marble, M.D.   ASSISTANT:  None.   ANESTHESIA:  General.   TOURNIQUET TIME:  20 minutes.   COMPLICATIONS:  None.   DRAINS:  None.   SPECIMENS:  One specimen sent.   DESCRIPTION OF PROCEDURE:  The patient was taken to the operating suite  after induction of general anesthesia, left upper extremity was prepped  in the usual sterile fashion.  Esmarch was used to sound the limb.  Tourniquet was inflated 275 mmHg.  At this point in time, a oblique  incision was made, centered from the mid palm area out to the web space  between the index and long finger.  Skin was incised.  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  It was  intimately involved with the common digital nerve to the ulnar side of  the index finger and radial side of the long finger.  This was carefully  dissected free down to terminal branches.  The tumor was then carefully  free from the underlying flexor sheath.  After this was completed, the  wound was thoroughly irrigated.  Hemostasis was achieved with bipolar  cautery.  The wound was loosely closed with 3-0 Prolene subcuticular  stitch.  Steri-Strips, 4x4s, fluffs and compressive dressing was  applied.  The patient tolerated the procedure well and went to the  recovery room in stable condition.      Artist Pais Mina Marble, M.D.  Electronically Signed     MAW/MEDQ  D:  11/14/2006  T:  11/14/2006  Job:  [id number]

## 2010-07-12 NOTE — Assessment & Plan Note (Signed)
[facility]                         GASTROENTEROLOGY OFFICE NOTE   NAME:BAILEYDartha, Rozzell                       MRN:          [id number]  DATE:04/09/2007                            DOB:          [birth year]    CHIEF COMPLAINT:  Followup, get acquainted.   [name] is a lady with known reflux disease, irritable bowel and  severe colonic diverticulosis, as well as history of adenomatous colon  polyps.  She is doing reasonably well.  She tells me that she does not  use caffeine.  She is not a smoker.  She has decreased what she eats, as  far as amount of foods, like sweet potatoes and acidic foods, and she  really only needs to use her Protonix intermittently.  Her colon is not  giving her trouble now and she is not having any abdominal pain, as she  has had in the past before.  She has previously been followed by Dr.  Corinda Gubler.  Her activity is limited by her chronic low back pain.   MEDICATIONS:  Are listed and reviewed in the chart.   ALLERGIES:  Are listed and reviewed in the chart.   PAST MEDICAL HISTORY:  Reviewed and unchanged.  I reviewed the list in  the EMR and updated that with a pre-load from a GI perspective.  She  sees Dr. Kriste Basque for primary care.   Weight 187 pounds, pulse 64, blood pressure 116/70.   ASSESSMENT AND PLAN:  1. Gastroesophageal reflux disease, doing well on intermittent      Protonix and dietary therapy.  She is counseled to try to lose      weight by cutting portion size and limiting snacking.  2. Obesity, as above.  3. Severe diverticulosis, left colon.  4. History of colonic polyps, not due for a colonoscopy until March of      2011 on a routine basis.   I will see her back as needed.     Iva Boop, MD,FACG  Electronically Signed    CEG/MedQ  DD: 04/09/2007  DT: 04/10/2007  Job #: [id number]

## 2010-07-12 NOTE — Procedures (Signed)
NAME:  April Donaldson, April Donaldson NO.:  [id number]   MEDICAL RECORD NO.:  [id number]          PATIENT TYPE:  OUT   LOCATION:  SLEEP CENTER                 FACILITY:  [facility]   PHYSICIAN:  Barbaraann Share, MD,FCCPDATE OF BIRTH:  05-19-1933   DATE OF STUDY:  10/14/2007                            NOCTURNAL POLYSOMNOGRAM   REFERRING PHYSICIAN:  Lonzo Cloud. Kriste Basque, MD   INDICATION FOR STUDY:  Hypersomnia with sleep apnea.   EPWORTH SLEEPINESS SCORE:  4.   SLEEP ARCHITECTURE:  The patient had a total sleep time of 366 minutes  with no slow-wave sleep and adequate quantity of REM.  Sleep onset  latency was normal, and REM onset was mildly prolonged.  Sleep  efficiency was moderately decreased at 83%.   RESPIRATORY DATA:  The patient was found to have 23 apneas and 96  hypopneas for an AHI of 20 events per hour.  The events were worse in  the supine position and during REM, and mild snoring was noted  throughout.   OXYGEN DATA:  There was O2 desaturation as low as 66% during her more  severe events.   CARDIAC DATA:  Occasional PVCs, but no clinically significant  arrhythmias were noted.   MOVEMENT/PARASOMNIA:  The patient had no significant leg jerks or  abnormal behaviors.   IMPRESSION/RECOMMENDATION:  1. Moderate obstructive sleep apnea/hypopnea syndrome with an AHI of      20 events per hour and O2 desaturation as low as 66%.  Treatment      for this degree of sleep apnea can include weight loss alone if      applicable, upper airway surgery, oral appliance and also CPAP.  2. Occasional PVCs, but no clinically significant arrhythmias were      noted.      Barbaraann Share, MD,FCCP  Diplomate, American Board of Sleep  Medicine  Electronically Signed     KMC/MEDQ  D:  10/18/2007 18:26:44  T:  10/18/2007 21:23:15  Job:  [id number]

## 2010-07-15 NOTE — H&P (Signed)
[place]  Patient:    April Donaldson, April Donaldson Visit Number: [id number] MRN: [id number]          Service Type: DSU Location: RCRM [id number] [id number] Attending Physician:  Waldon Merl Admit Date:  11/07/2000   CC:         [name]. Eden Emms, M.D. [facility]  Scott M. Kriste Basque, M.D. [facility]  H. Retia Passe, Montez Hageman., M.D.   History and Physical  HISTORY OF PRESENT ILLNESS:  This patient is a 77 year old female who has had difficulty swallowing in the past, had a feeling of pressure on her trachea and in her throat, and we felt that she had some fullness in the right side of her thyroid, right side of her neck, and obtained appropriate tests.  She had also had sinus problems where she was tested for sinus difficulties, finding some mild mucoperiosteal thickening in the maxillary but otherwise in quite good status.  However, also her neck CT showed approximately a 3.5 cm heterogenously enhancing mass in the posteroinferior aspect of the right lobe of the thyroid.  This was pressing considerably on the esophagus in the trachea, and she was feeling some weakness or change in her voice, and this fullness of her neck and difficulty coughing with this situation.  We furthermore got a thyroid ultrasound which showed the enlarged right lobe nodule, involving the mid inferior aspect of the right lobe was delineated in this dictated note.  The patient had further thyroid work-up showing that her T4 was 8.3, TSH was 5.138, and the T3 was 152, all within normal range.  She continued to have this problem.  We felt it was a cystic problem.  We did not decide to place a needle in this, considering its location and the close association of the major vessels.  We therefore elected to remove this, doing a right subtotal lobectomy.  PAST MEDICAL HISTORY:  She had a stress Cardiolite test, and she was found to do very well.  She does have a history of hypertension, hyperlipidemia, and  a positive family history of coronary artery disease.  She had no chest pain during this time.  She had no electrocardiographic changes.  Her cine graphic results showed no evidence of any ischemic or infarction in any vascular territory.  Her gaited ejection fraction was 78%.  Her further past history is one where she has had a hysterectomy.  She had three surgeries, apparently had some difficulty with the hysterectomy and has poor renal function on the left side due to lack of blood supply.  She has also had the removal of an ovarian tumor, and she has also had a tonsillectomy years ago.  Her cardiac stress test was done August 2, by Dr. Eden Emms.  She also sees Dr. Elige Radon for urology. She has had some right leg pain occasionally.  ALLERGIES:  TETRACYCLINE, IODINE, APRESOLINE.  MEDICATIONS:  1. Zocor 25 mg q.d.  2. Premarin 3 mg q.d.  3. Atenolol 150 mg q.d.  4. Dyazide 37.25 mg q.d.  5. Kay Ciel 10 mEq b.i.d.  6. Vicon 40 q.d.  7. Citrate q.d.  8. Vitamin D.  9. Centrum Silver q.d. 10. Aspirin 81 mg q.d. 11. Vitamin E 400 mg q.d. 12. Protonix. 13. Timoptic 0.25% q.d.  PHYSICAL EXAMINATION:  VITAL SIGNS:  Blood pressure 144/94, heart rate 60, weight 181, temperature 97.2.  GENERAL:  She is a well-nourished, well-developed female in no acute distress with the complaint of difficulty swallowing, change in  her voice, persistent coughing, and with this full right thyroid apparent cyst.  HEENT:  Her larynx looks clear.  There is no obvious vocal cord abnormalities, and the true cords, false cords, epiglottis, base of tongue are all clear of any ulceration or mass, and her trachea also looks clear of any infection. There is no evidence of reflux esophagitis.  Her ears are clear.  Nasal and nasopharynx is clear.  NECK:  Free of any cervical adenopathy but does feel the thyromegaly on the right side.  CHEST:  Clear with no rales, rhonchi, or wheezes.  CARDIOVASCULAR:  No  rubs, murmurs, or gallops.  ABDOMEN:  Mildly obese.  EXTREMITIES:  Within normal limits.  NEUROLOGIC:  Oriented x 3.  Cranial nerves intact.  INITIAL DIAGNOSIS:  Right inferior posterior lobe thyroid cyst causing symptoms, and our plan is to do a right thyroid lobectomy and removal of this cyst.  SECONDARY DIAGNOSES: 1. History of removal of ovarian tumor. 2. Hysterectomy x 3 surgeries with a concern about left kidney vascular flow. 3. She has also had a tonsillectomy. 4. She also has a history of arteriosclerotic cardiovascular disease in her    family.  Stress test had been reviewed as excellent. Attending Physician:  Waldon Merl DD:  11/07/00 TD:  11/07/00 Job: [id number]

## 2010-07-15 NOTE — Op Note (Signed)
NAME:  MONTIE, GELARDI                          ACCOUNT NO.:  [id number]   MEDICAL RECORD NO.:  [id number]                   PATIENT TYPE:  AMB   LOCATION:  DSC                                  FACILITY:  [facility]   PHYSICIAN:  Hermelinda Medicus, M.D.                DATE OF BIRTH:  06-Nov-1931   DATE OF PROCEDURE:  06/18/2003  DATE OF DISCHARGE:                                 OPERATIVE REPORT   PREOPERATIVE DIAGNOSES:  Bilateral maxillary sinusitis with turbinate  hypertrophy; history of sleep apnea; history of chronic long-term sinusitis.   POSTOPERATIVE DIAGNOSES:  Bilateral maxillary sinusitis with turbinate  hypertrophy; history of sleep apnea; history of chronic long-term sinusitis.   OPERATION:  Functional endoscopic sinus surgery, bilateral maxillary sinus  ostia enlargement and antrostomies with inferior turbinate reduction.   OPERATOR:  Hermelinda Medicus, M.D.   ANESTHESIA:  Local, 1% Xylocaine with epinephrine, and topical cocaine 200  mg.   PROCEDURE:  The patient was placed in the supine position. Under local  anesthesia was prepped and draped in the appropriate manner using Hibiclens  and the usual head drape. Topical cocaine was used and then 1% Xylocaine  with epinephrine was used, and the inferior turbinates were aggressively out-  fractured in an effort to gain some space within the nasal vestibule. Once  we were able to gain this space then we could see much more effectively and  work toward the maxillary sinus natural ostia which were totally obstructed  with scar tissue, and x-ray and CAT scans were showing fluid levels on both  sides. The remainder of the sinuses looked quite descent and seemed to be  draining reasonably well. The left maxillary sinus we first approached,  using the curved suction to try to find the exact natural ostium, and then  using the side biting forceps we increased this natural ostium approximately  five times its normal size. We also did an  antrostomy on this side in an  effort to gain some further drainage and suction fluid from within the  maxillary sinus. We then approached the right side where again we tried to  find the natural ostium which was essentially a pinhole and a curved suction  was used to essentially stabilize this and then using a 0-degree scope once  again, the side biting forceps once again, and the angle Blakesley-Wilde  were increased the size of the maxillary sinus natural ostium to  approximately five times its normal size, and removed the polypoid debris  from around these ostia on both sides. We then, at this point, suctioned her  sinuses, we suctioned the back of her throat, placed two Merocel packs, and  took the patient to recovery room in good condition. Followup will be  tomorrow for removal of Merocel pack and then one week, three weeks, six  weeks, six months, and a year.  Hermelinda Medicus, M.D.    JC/MEDQ  D:  06/18/2003  T:  06/18/2003  Job:  [id number]   cc:   Lonzo Cloud. Kriste Basque, M.D. [facility]   Marcelyn Bruins, M.D. [facility]

## 2010-07-15 NOTE — Assessment & Plan Note (Signed)
[facility]                         GASTROENTEROLOGY OFFICE NOTE   NAME:[name], [name]                       MRN:          [id number]  DATE:06/29/2006                            DOB:          19-May-1931    April Donaldson comes in to discuss a Protonix refill and also notes some dull  aching in the left upper quadrant for the past year.  Denies any nausea  or vomiting, or change in bowel habits.  Otherwise, has been doing fine.  She has a long history of irritable bowel syndrome because of a known  torturous colon that she has, and she has had a history of colon polyps  in the past, as well as some peptic ulcer disease.  Her last  colonoscopic examination was in 2006 and reveals an extremely torturous  colon, and a small polyp in the transverse colon, which was removed, and  she was advised to have a followup in 5 years from that time.   PAST MEDICAL HISTORY:  She has had some GERD, which has responded nicely  to Protonix.  She is status post hysterectomy, tonsillectomy, and  thyroidectomy.   CURRENT MEDICATIONS:  Zocor.  Diazide.  Levoxyl.  Lotrel.  Caltrate.  Atenolol.  Protonix.   PHYSICAL EXAMINATION:  She weighed 187, blood pressure 124/70, pulse 64  and regular.  Neck and upper extremities unremarkable.   IMPRESSION:  As above.   RECOMMENDATIONS:  To get a colonscopy examination and I suggested that  she be followed up some time in the future by one of my partners in my  retirement.     Ulyess Mort, MD  Electronically Signed    SML/MedQ  DD: 06/30/2006  DT: 06/30/2006  Job #: [id number]

## 2010-07-15 NOTE — Assessment & Plan Note (Signed)
[facility]                            CARDIOLOGY OFFICE NOTE   NAME:April Donaldson, April Donaldson                       MRN:          [id number]  DATE:05/16/2006                            DOB:          1931-11-02    April Donaldson is seen today as a new consult for Dr. Kriste Basque. I last saw her  back in 2002. At the time she had chest pain with family history of  coronary disease and hypertension.   She had a Myoview study which was normal.   She was concerned about family history of congestive heart failure. She  has been having some exertional dyspnea. The dyspnea seems functional.  She is overweight. She is not physically very active.   She has had some atypical chest pain, it is positional, particularly  happens at night when she is sleeping.   Overall I think that the chest pain is atypical for angina.   She is a nonsmoker. She does have hypercholesterolemia and has been on  Zocor.   REVIEW OF SYSTEMS:  There has been no syncope, no palpitations, no PND.  She has trace lower extremity edema particularly when there is extra  salt in the diet.   SHE HAS MULTIPLE DRUG INTOLERANCES INCLUDING, PENICILLIN, TETRACYCLINE,  NORVASC, CARDIZEM, LABETALOL, BENICAR, CLONIDINE, AND IODINE.   The patient has never been married. She has siblings in the area but  really does not spend much time with them. She is 8 years older than any  of her other siblings. She goes to church but otherwise seems to be  fairly withdrawn and likes to spend time at home.   PAST SURGICAL HISTORY:  Unremarkable. She has been treated for high  blood pressure for quite sometime.   CURRENT MEDICATIONS:  Include;  1. Zocor 40 a day.  2. Diazide 37.25 a day.  3. Levoxyl 50 mcg a day.  4. Lotrel 5/10.  5. Travertine drops.  6. Caltrate.  7. Vitamins.  8. Atenolol 100 a day.  9. Potassium 10 b.i.d.   FAMILY HISTORY:  Remarkable for her mother dying of heart failure, heart  failure onset  was in her 30s. Father died of kidney failure. Family  history is otherwise noncontributory.   On exam, blood pressure is 130/80, pulse is 70 and regular.  HEENT: Normal. Carotids normal without bruits. JVP is not elevated.  LUNGS: Clear.  There is an S1, S2 distant heart sounds.  ABDOMEN: Benign.  LOWER EXTREMITIES: Intact pulse, no edema.   EKG: Shows sinus rhythm with poor R wave progression and left axis  deviation.   IMPRESSION:  Strong family history of coronary disease and heart  failure, longstanding hypertension. Hyperlipidemia. I think it is  reasonable for the patient to have a follow up Myoview study.   She has poor R wave progression on her ECG.   She does not think she can walk on a treadmill as she did 5 years ago.  In regards to her hypertension, it seems well treated. She should  continue her diuretic and beta blocker.   The patient will have a  2D echocardiogram as well, in regards to her  dyspnea I think it is functional but her heart sounds are distant due to  her size and it would be reasonable to check her left ventricular  function and rule out cor pulmonale or other valvular disease.   I suspect both these tests will be low risk and she can follow up with  Dr. Kriste Basque.   Overall she seems to have reasonable health for her age.     April Donaldson. April Emms, MD, [facility]  Electronically Signed    PCN/MedQ  DD: 05/16/2006  DT: 05/16/2006  Job #: [id number]

## 2010-07-15 NOTE — H&P (Signed)
NAME:  April Donaldson, April Donaldson                          ACCOUNT NO.:  [id number]   MEDICAL RECORD NO.:  [id number]                   PATIENT TYPE:  AMB   LOCATION:  DSC                                  FACILITY:  [facility]   PHYSICIAN:  Hermelinda Medicus, M.D.                DATE OF BIRTH:  [birth year]   DATE OF ADMISSION:  06/18/2003  DATE OF DISCHARGE:                                HISTORY & PHYSICAL   HISTORY OF PRESENT ILLNESS:  This patient is a 77 year old female who has  had multiple years of sinusitis problems.  She has been on antibiotics using  Z-Pak, Cefzil, she has been on Levaquin more frequently and more recently  and also has used decongestant nasal sprays, the steroid sprays, and is now  to the point where a CAT scan was obtained and she has shown bilateral  maxillary sinus fluid on the left and the right, the remainder of the  sinuses are in quite decent condition.  At this point we continued using antibiotics and decongestants and still the  problem continued and therefore we now present for a functional endoscopic  sinus surgery.   PAST MEDICAL HISTORY:  Is one of mild cardiac increased size apparently.  Her chest x-ray is within normal limits.   ALLERGIES:  APRESOLINE, TETRACYCLINE, NORVASC, CARDIZEM, LABETALOL, BENICAR,  CLONIDINE, AND INDOCIN.   MEDICATIONS:  1. Zocor 40 daily.  2. Diazide 37/25 daily.  3. Protonix 40 daily.  4. Levoxyl 0.05 mg daily.  5. Potassium 10 mEq daily.  6. Vitamin E 400 units.  7. Vicon Forte 1 capsule.  8. Lotrel.  9. Timoptic.  10.      Caltrate.  11.      Centrum.  12.      Atenolol 100 mg.  13.      Aspirin 31 mg.   Her further past history is one of having had a thyroidectomy in 2002.  A  hysterectomy.  A tonsillectomy as a child.  She had a sleep study done under  Dr. Marcelyn Bruins and she has considered CPAP but has never used this.  Her  RDI was 12 and her lowest O2 was 87.  She does have the thyroid care, having  had a partial  subtotal thyroidectomy.  Reflex is moderate.  She has a non  functioning left kidney.  She has glaucoma and now enters for functional  endoscopic sinus surgery under local with MAC stand-by.   PHYSICAL EXAMINATION:  VITAL SIGNS:  Blood pressure is 146/92, her weight is  184, she is 4 feet, 11 inches.  HEENT:  The ears are clear, the tympanic membranes are clear, the oral  cavity is clear.  NECK:  Is free of any thyromegaly, cervical adenopathy or mass.  She has had  the previous thyroid surgery.   Her larynx is clear, true cords, false cords, epiglottis, base of tongue are  clear  of ulceration or mass.  Her true cord mobility gag reflex, EOM's,  facial nerve are also symmetrical.  The nose shows severe turbinate  hypertrophy, she has had drainage down the back of her throat.  CHEST:  Clear, no rales, rhonchi or wheezes.  CARDIOVASCULAR:  No murmurs or gallops, increased AP diameter.  ABDOMEN:  Mildly obese.  EXTREMITIES:  Unremarkable.   INITIAL DIAGNOSES:  1. Maxillary sinusitis bilaterally.  2. Increased cardiomegaly with a chest x-ray within normal limits.  3. Electrocardiogram within normal limits.  4. Multiple allergies.  5. History of hysterectomy, tonsillectomy and thyroidectomy.                                                Hermelinda Medicus, M.D.    JC/MEDQ  D:  06/18/2003  T:  06/19/2003  Job:  [id number]   cc:   Lonzo Cloud. Kriste Basque, M.D. [facility]   Marcelyn Bruins, M.D. [facility]

## 2010-07-15 NOTE — Op Note (Signed)
[place]. [facility]  Patient:    April Donaldson, April Donaldson Visit Number: [id number] MRN: [id number]          Service Type: DSU Location: [id number] Attending Physician:  Waldon Merl Dictated by:   Keturah Barre, M.D. Admit Date:  11/07/2000   CC:         Lonzo Cloud. Kriste Basque, M.D. [facility]  Theron Arista C. Eden Emms, M.D. [facility]  H. Retia Passe, Montez Hageman., M.D.  Kristine Garbe. Ezzard Standing, M.D.   Operative Report  PREOPERATIVE DIAGNOSES:  Right posterior inferior thyroid cyst with symptoms of voice change, cough and esophageal stress, difficulty swallowing.  POSTOPERATIVE DIAGNOSES:  Right posterior inferior thyroid cyst with symptoms of voice change, cough, esophageal stress, and difficulty swallowing.  OPERATION:  Right thyroid lobectomy with removal of the cyst, 2.5 cm in size.  SURGEON:  Keturah Barre, M.D.  ASSISTANT: Kristine Garbe. Ezzard Standing, M.D.  ANESTHESIA:  General endotracheal anesthesia.  DESCRIPTION OF PROCEDURE:  The patient was placed in the supine position. Under general endotracheal anesthesia the patient had a shoulder roll placed under her shoulders for her neck to be extended and she was prepped with Betadine and appropriately draped.  A necklace incision was made across her neck in an appropriate crease and this was carried down through the skin, the fat and platysma, down to the strap muscles.  Strap muscles were separated and retracted laterally and then the thyroid could be easily seen.  The isthmus of the thyroid was severed and the thyroid gland was separated from the anterior trachea.  We then dissected laterally and dissected the strap muscles from the thyroid itself.  All hemostasis was established with Bovie electrocoagulation.  We worked superiorly and inferiorly separating out the soft tissues, taking small vessels using the Bovie coagulation.  Then the larger superior inferior thyroid vasculature was taken using cross clamp and tie with  2-0 silk and as we worked down, we expected this to be sitting right in the junction of the esophagus and the trachea which the cyst was, leaning right on the recurrent nerve and we identified the recurrent nerve and carefully separated this and surrounding tissues from the cyst.  We were able to develop a plane inferiorly and worked our way superiorly, keeping the recurrent nerve in constant view so that it would not be damaged.  All hemostasis in this area was carried out with the bipolar electrocoagulation so that we had no damage to the recurrent nerve.  Once the inferior aspect of the thyroid was completely dissected free, then we left one small area of the superior portion of the lobe as there was no evidence of any pathology in this area.  This was cross clamped, cut and then suture ligated using 2-0 silk.  All hemostasis was again checked.  The area was irrigated.  Bovie electrocoagulation was used for lateral hemostasis and the bipolar for any area near the recurrent nerve which was obvious now all the way from the superior to the inferior aspect of the thyroid. Esophagus was seen was the major vasculature of the carotid and jugular veins as was the entire trachea on that side.  All hemostasis was checked and found to be clear.  A #7 Jackson-Pratt drain was used and closure was done by partially closing the strap muscles but leaving a great deal of room for any drainage and then the skin was closed using 2-0 chromic for the skin and 5-0 Ethilon.  Steri-Strips were applied.  The  Jackson-Pratt drain was attached and the patient was taken to the recovery room in good condition with minimal drainage.  The patient tolerated the procedure well and is doing well postoperatively and will be kept overnight and then follow-up will be in five days, 10 days and three weeks, six weeks and six months. Dictated by:   Keturah Barre, M.D. Attending Physician:  Waldon Merl DD:   11/07/00 TD:  11/07/00 Job: [id number]

## 2010-07-18 ENCOUNTER — Other Ambulatory Visit (HOSPITAL_COMMUNITY): Payer: Self-pay | Admitting: Otolaryngology

## 2010-07-18 DIAGNOSIS — T17998A Other foreign object in respiratory tract, part unspecified causing other injury, initial encounter: Secondary | ICD-10-CM

## 2010-07-20 ENCOUNTER — Encounter (HOSPITAL_COMMUNITY): Payer: Medicare Other

## 2010-07-20 ENCOUNTER — Ambulatory Visit (HOSPITAL_COMMUNITY)
Admission: RE | Admit: 2010-07-20 | Discharge: 2010-07-20 | Disposition: A | Discharge status: Home / Self Care | Payer: Medicare Other | Source: Ambulatory Visit | Attending: Otolaryngology | Admitting: Otolaryngology

## 2010-07-20 DIAGNOSIS — T17208A Unspecified foreign body in pharynx causing other injury, initial encounter: Secondary | ICD-10-CM | POA: Insufficient documentation

## 2010-07-20 DIAGNOSIS — IMO0002 Reserved for concepts with insufficient information to code with codable children: Secondary | ICD-10-CM | POA: Insufficient documentation

## 2010-07-20 DIAGNOSIS — T17998A Other foreign object in respiratory tract, part unspecified causing other injury, initial encounter: Secondary | ICD-10-CM

## 2010-07-20 DIAGNOSIS — R1310 Dysphagia, unspecified: Secondary | ICD-10-CM | POA: Insufficient documentation

## 2010-09-07 ENCOUNTER — Ambulatory Visit: Payer: Self-pay | Admitting: Pulmonary Disease

## 2010-09-16 ENCOUNTER — Encounter: Payer: Self-pay | Admitting: Pulmonary Disease

## 2010-09-16 ENCOUNTER — Ambulatory Visit (INDEPENDENT_AMBULATORY_CARE_PROVIDER_SITE_OTHER): Payer: Medicare Other | Admitting: Pulmonary Disease

## 2010-09-16 DIAGNOSIS — M1990 Unspecified osteoarthritis, unspecified site: Secondary | ICD-10-CM

## 2010-09-16 DIAGNOSIS — F411 Generalized anxiety disorder: Secondary | ICD-10-CM

## 2010-09-16 DIAGNOSIS — K589 Irritable bowel syndrome without diarrhea: Secondary | ICD-10-CM

## 2010-09-16 DIAGNOSIS — K219 Gastro-esophageal reflux disease without esophagitis: Secondary | ICD-10-CM

## 2010-09-16 DIAGNOSIS — E039 Hypothyroidism, unspecified: Secondary | ICD-10-CM

## 2010-09-16 DIAGNOSIS — D3000 Benign neoplasm of unspecified kidney: Secondary | ICD-10-CM

## 2010-09-16 DIAGNOSIS — N259 Disorder resulting from impaired renal tubular function, unspecified: Secondary | ICD-10-CM

## 2010-09-16 DIAGNOSIS — M109 Gout, unspecified: Secondary | ICD-10-CM

## 2010-09-16 DIAGNOSIS — I10 Essential (primary) hypertension: Secondary | ICD-10-CM

## 2010-09-16 DIAGNOSIS — D126 Benign neoplasm of colon, unspecified: Secondary | ICD-10-CM

## 2010-09-16 DIAGNOSIS — G4733 Obstructive sleep apnea (adult) (pediatric): Secondary | ICD-10-CM

## 2010-09-16 DIAGNOSIS — I872 Venous insufficiency (chronic) (peripheral): Secondary | ICD-10-CM

## 2010-09-16 DIAGNOSIS — E7800 Pure hypercholesterolemia, unspecified: Secondary | ICD-10-CM

## 2010-09-16 DIAGNOSIS — I679 Cerebrovascular disease, unspecified: Secondary | ICD-10-CM

## 2010-09-16 DIAGNOSIS — E669 Obesity, unspecified: Secondary | ICD-10-CM

## 2010-09-16 NOTE — Progress Notes (Signed)
Subjective:    Patient ID: April Donaldson, female    DOB: 19-Feb-1932, 77 y.o.   MRN: [id number]  HPI 77 y/o BF here for a 6 month follow up visit... she has multiple medical problems as noted below... 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  ~  Jan11:  she contracted URI from grandkids over the holidays- c/o cough, congestion, & yellow mucous>> we discussed Rx w/ ZPak, Mucinex, Fluids... otherw BP has been good, stable on meds, weight down a few lbs... had yearly f/u DrWebb for Nephrology- she has atrophic non-functioning left kidney & Creat stable at 1.1, no changes made... due for CXR, fasting labs today... ~  Jul11:  she continues to see her mult specialists for reassurance regarding her mult somatic complaints>  saw DrClance 3/11 for yearly sleep f/u- doing well, asked to lose weight!... saw DrGessner 3/11 w/ colonoscopy showing divertics, stenosis in sigmoid, hems, & 2 sm polyps= adenoma fragments, f/u 81yrs... saw DrBorden 3/11 for 79mo f/u angiomyolipoma right kidney- no change on sonar, reassured, f/u 79mo per pt request for on-going reassurance... she has noted some ankle edema- hasn't lost any weight, BP controlled w/ meds & Dyazide> rec to elim sodium, elevate, wear support hose...  ~  March 08, 2010:  79mo ROV- generally stable, wonders if she can stop any meds, and concerned about spot behind ear= benign SK & advised Derm for excision... notes some mild tinnitus & offered ENT eval but she declines at this time> we discussed "white noise" masking... BP remains under good control;  Chol looks good on Simva40;  Thyroid stable on Levoxyl;  weight down 5# to 186#;  GI is stable;  she had f/u DrBorden 11/11 w/ another sonar= no change AML or cyst;  she tells me  she's not taking the Allopurinol every day & no gout attacks;  she is seeing a chiropractor, DrGibson, for LBP- s/p "spinal decompression" & improved...  ~  September 16, 2010:  79mo ROV & she has been stable> notes one day of abd discomfort recently (sounded like IBS- she thinks related to something that she ate) & she is offered Bentyl vs CT Abd vs GI follow up (DrGessner) but she prefers to wait 7 see if symptoms recur...  Stable on current meds which we again reviewed one-at-a-time since she wants to stop any unneeded medications...     [facility] records indicate she had an ENT eval by DrCrossley 5/12 w/ Ba swallow but we don't have his notes or the test results...    She saw DrBorden for her routine 79mo Urology check up (atrophic left kidney, bilat sm renal cysts,  & 0.7cm right renal angiomyolipoma); Ultrasound was essent unchanged & she was reassured...    She had f/u BMD 3/12 at [facility] w/ TScore -0.7 in FemNeck> normal...    She saw drClance for SleepMed f/u 2/12> stable on her CPAP...    Problem List:  GLAUCOMA (ICD-365.9) - followed by DrShapiro on gtts... 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  OBSTRUCTIVE SLEEP APNEA (ICD-780.57) - Hx borderline sleep study in past w/ RDI=12, desat to 87%... increased symptoms of daytime hypersomn resulted in repeat sleep study 8/09 w/ RDI= 20, mild snoring, & desat to 66%- so she was started on CPAP by DrClance, adjusted to 10cm pressure and  she reports doing well after mask adjustments... ~  2/12:  yearly f/u DrClance- doing well, no changes made.  HYPERTENSION (ICD-401.9) - controlled on ATENOLOL 100mg /d, LOTREL 5-10 daily, & DYAZIDE 1/d, & KCl - 2daily...  BP= 120/74 today (similar at home)- she takes meds regularly & tolerates well... she tries to follow a low sodium diet and she has tried to count calories and lose wt... denies HA, fatigue, visual changes, CP, palipit, dizziness, syncope, dyspnea, etc...  ~  2DEcho 4/08 was normal w/ EF=55-65%...   ~  NuclearStressTest 4/08 was negative- no ischemia & EF=79% (similiar to 2002)... ~  Dobutamine Echo 4/10 was neg as well... ~  CXR 1/11 showed stable cardiomeg, ectasia of thorAo, incr markings bases, NAD...  CHEST PAIN, ATYPICAL (ICD-786.59) - she went to the ER 3/10 w/ atyp CP & ruled out for MI- seen by Walker Kehr w/ neg DobutEcho 4/10 (no change in meds)...  CEREBROVASCULAR DISEASE (ICD-437.9) - on ASA 81mg /d... prev CDopplers 6/04 showed mild plaque, 0-39% bilat...  VENOUS INSUFFICIENCY (ICD-459.81) - on low sodium diet, +elevation, +support hose, & Dyazide...  HYPERCHOLESTEROLEMIA (ICD-272.0) - on SIMVASTATIN 40mg /d + diet efforts... ~  FLP 7/08 showed TChol 156, TG 118, HDL 55, LDL 78 ~  FLP 7/09 showed TChol 159, TG 142, HDL 48, LDL 83... rec- continue same. ~  FLP 1/10 showed TChol 152, TG 111, HDL 55, LDL 75 ~  FLP 7/10 showed TChol 151, TG 81, HDL 59, LDL 76... continue Simva40. ~  FLP 1/11 showed TChol 155, TG 148, HDL 48, LDL 77 ~  FLP 7/11 showed TChol 147, TG 103, HDL 56, LDL 71... continue Simva40. ~  FLP 1/12 showed TChol 162, TG 110, HDL 60, LDL 80  HYPOTHYROIDISM (ICD-244.9) - on LEVOXYL 7mcg/d... s/p R thyroid lobectomy 2002 by DrCrossley for cyst...  ~  labs 1/09 on Levoxyl50 showed TSH= 3.10 ~  labs 7/09 on Levoxyl50 showed TSH= 2.34 ~  labs 1/10 showed TSH= 3.78... continue LEVOXYL50 ~  labs 7/10 showed TSH= 2.54 ~  labs 1/11 showed TSH= 2.95 ~  labs 7/11 showed TSH= 2.64 ~  labs 1/12 showed TSH= 3.59  OBESITY (ICD-278.00) -  she knows that she needs to diet, exercise and get her weight down! ~  weight 1/10 = 193#,  she is 4\' 10"  tall,  BMI= 40... ~  weight 7/10 = 194# ~  weight 1/11 = 188# ~  weight 7/11 = 191# ~  weight 1/12 = 186# ~  Weight 7/12 = 187#  GERD (ICD-530.81) - she uses OTC Prilosec 20mg  Prn... she saw DrGessner in Feb09- doing satis...  IRRITABLE BOWEL SYNDROME (ICD-564.1) &  COLONIC POLYPS (ICD-211.3) - colonoscopy 3/06 by DrSamLeBauer  showed extremely tortuous colon, divertics, sm polyp... f/u 52yrs... ~  colonoscopy 3/11 by DrGessner showed divertics, stenosis in sigmoid, hems, & 2 sm polyps= adenoma fragments, f/u 22yrs.  RENAL INSUFFICIENCY (ICD-588.9) -.she has an atrophic non-functioning L Kidney and has seen DrWebb- last 8/09...   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   ~  labs 1/09 showed BUN= 15, Creat= 1.0 ~  labs 1/10 showed BUN= 18, Creat= 1.0 ~  labs 1/11 showed BUN= 16, Creat= 1.0 ~  labs 1/12 showed BUN= 19, Creat= 0.9   BENIGN NEOPLASM OF KIDNEY (ICD-223.0) - followed by DrBorden for angiomyolipoma (7mm right renal lesion- AML confirmed by MRI & followed by ultrasound exams) & renal cysts... she  is seen every 79mo for sonars due to her severe anxiety about these benign lesions... ~  5/12:  routine 79mo f/u DrBorden w/ sonar> no change in 7mm AML right kidney, bilat renal cysts, sm right kidney...  DEGENERATIVE JOINT DISEASE (ICD-715.90) - she sees DrHilts for Ortho... LOW BACK PAIN, CHRONIC (ICD-724.2) - she tells me she is seeing a chiropractor, DrGibson, for "spinal decompression" & improved  GOUT (ICD-274.9) - Uric Acid Jul09 was 8.1 and ALLOPURINOL 100mg /d started by DrWebb... ~  labs 7/10 showed Uric= 5.8 ~  labs 1/11 showed Uric= 5.7 ~  labs 1/12 showed Uric= 6.1  ANXIETY (ICD-300.00) - she has severe anxiety about her health status, doesn't want nerve pills.  Health Maintenance - her GYN is ?(NP Maurice March)... BMD at SER done 2/ 10 & improved, on calcium, vits, etc... ~  labs 7/09 showed Vit D level = 57 ~  labs 7/10 showed Vit D level = 46 ~  labs 7/11 showed Vit D level = 68   No past surgical history on file.   Outpatient Encounter Prescriptions as of 09/16/2010  Medication Sig Dispense Refill  . allopurinol (ZYLOPRIM) 100 MG tablet Take 100 mg by mouth daily.        Marland Kitchen aspirin 81 MG tablet Take 81 mg  by mouth daily.        Marland Kitchen atenolol (TENORMIN) 100 MG tablet Take 100 mg by mouth daily.        . Calcium Carbonate-Vitamin D (CALTRATE 600+D) 600-400 MG-UNIT per tablet Take 1 tablet by mouth 2 (two) times daily.        . dorzolamide (TRUSOPT) 2 % ophthalmic solution Place 1 drop into both eyes 2 (two) times daily.        . Guaifenesin (MUCINEX MAXIMUM STRENGTH) 1200 MG TB12 Take 1 tablet by mouth daily.        Marland Kitchen latanoprost (XALATAN) 0.005 % ophthalmic solution Place 1 drop into the right eye at bedtime.        Marland Kitchen levothyroxine (LEVOXYL) 50 MCG tablet Take 50 mcg by mouth daily.        . Multiple Vitamins-Minerals (CENTRUM SILVER PO) Take 1 tablet by mouth daily.        . potassium chloride (MICRO-K) 10 MEQ CR capsule TAKE TWO CAPSULES TWICE DAILY                                                  GENERIC FOR MICRO-K  120 capsule  11  . simvastatin (ZOCOR) 40 MG tablet Take 40 mg by mouth at bedtime.        . triamterene-hydrochlorothiazide (DYAZIDE) 37.5-25 MG per capsule Take 1 capsule by mouth every morning.  30 capsule  6    Allergies  Allergen Reactions  . Amlodipine Besylate   . Clonidine     Sweating, nausea, felt faint  . Diltiazem Hcl   . Doxycycline Hyclate     REACTION:  tetracycline  . Iodine     Injection iodine--  . Labetalol Hcl   . Olmesartan Medoxomil   . Penicillins     Pt not allergic--just causes yeast infections    Current Medications, Allergies, Past Medical History, Past Surgical History, Family History, and Social History were reviewed in [employer] record.   Review of Systems  See HPI - all other systems neg except as noted...  The patient complains of dyspnea on exertion.  The patient denies anorexia, fever, weight loss, weight gain, vision loss, decreased hearing, hoarseness, chest pain, syncope, peripheral edema, prolonged cough, headaches, hemoptysis, abdominal pain, melena, hematochezia, severe indigestion/heartburn,  hematuria, incontinence, muscle weakness, suspicious skin lesions, transient blindness, difficulty walking, depression, unusual weight change, abnormal bleeding, enlarged lymph nodes, and angioedema.     Objective:   Physical Exam     WD, Obese, 78 y/o BF in NAD... GENERAL:  Alert & oriented; pleasant & cooperative... HEENT:  /AT, EOM-wnl, PERRLA, EACs-clear, TMs-wnl, NOSE-clear, THROAT-clear & wnl. NECK:  Supple w/ fairROM; no JVD; normal carotid impulses w/o bruits; Thyroid surg scar, no thyromegaly or nodules palpated; no lymphadenopathy. CHEST:  Clear to P & A; without wheezes/ rales/ or rhonchi heard... HEART:  Regular Rhythm; without murmurs/ rubs/ or gallops detected... ABDOMEN:  Obese soft & nontender; normal bowel sounds; no organomegaly or masses palpated... EXT: without deformities, mild arthritic changes; no varicose veins/ venous insuffic/ tr edema. NEURO:  CN's intact; no focal neuro deficits... DERM:  No lesions noted; no rash etc...   Assessment & Plan:   OSA>  Followed by DrClance yearly; she wear CPAP compliantly, no mask issues except when "congested", no daytime hypersomnolence etc...  HBP>  Controlled on meds as listed, continue same...  Hx AtypicCP & mild cerebrovasc plaque>  On ASA w/o ischemic symptoms...  CHOL>  Stable on diet + Simva40, needs to lose weight!  Hypothyroid>  On Levoxyl 58mcg/d, continue same...  GI>  GERD, IBS, Colon Polyps>  Followed by DrGessner, stable on OTC Prilosec but doesn't want antispasmotic etc...  GU>  Atrophic right kidney, sm renal cysts, left angiomyolipoma, normal renal function> Followed by DrBorden every 97mo...  DJD/ GOUT>  Stable w/o acute gout attacks...  Anxiety>  She still declines anxiolytic Rx.Marland KitchenMarland Kitchen

## 2010-09-16 NOTE — Patient Instructions (Signed)
Today we updated your med list in EPIC...    Continue your current meds the same...  Let's get on track w/ our diet + exercise program...    The goal is to lose 10-15 lbs...  Call for any questions...  Let's plan a follow up visit w/ FASTING blood work in 6 months.Marland KitchenMarland Kitchen

## 2010-09-17 ENCOUNTER — Encounter: Payer: Self-pay | Admitting: Pulmonary Disease

## 2010-10-21 ENCOUNTER — Other Ambulatory Visit: Payer: Self-pay | Admitting: Pulmonary Disease

## 2010-11-06 ENCOUNTER — Other Ambulatory Visit: Payer: Self-pay | Admitting: Pulmonary Disease

## 2010-11-30 ENCOUNTER — Ambulatory Visit (INDEPENDENT_AMBULATORY_CARE_PROVIDER_SITE_OTHER): Payer: Medicare Other

## 2010-11-30 DIAGNOSIS — Z23 Encounter for immunization: Secondary | ICD-10-CM

## 2010-12-08 LAB — BASIC METABOLIC PANEL
BUN: 16
CO2: 25
Calcium: 9.2
Chloride: 109
Creatinine, Ser: 1.13
GFR calc Af Amer: 57 — ABNORMAL LOW
GFR calc non Af Amer: 47 — ABNORMAL LOW
Glucose, Bld: 110 — ABNORMAL HIGH
Potassium: 4
Sodium: 141

## 2010-12-08 LAB — POCT HEMOGLOBIN-HEMACUE
Hemoglobin: 13.8
Operator id: 123881

## 2011-01-18 ENCOUNTER — Other Ambulatory Visit: Payer: Self-pay | Admitting: Pulmonary Disease

## 2011-01-18 MED ORDER — ALLOPURINOL 100 MG PO TABS: 100.0000 mg | ORAL_TABLET | Freq: Every day | ORAL | Status: DC

## 2011-01-18 MED ORDER — TRIAMTERENE-HCTZ 37.5-25 MG PO CAPS: 1.0000 | ORAL_CAPSULE | ORAL | Status: DC

## 2011-01-24 ENCOUNTER — Telehealth: Payer: Self-pay | Admitting: Pulmonary Disease

## 2011-01-24 MED ORDER — TRIAMTERENE-HCTZ 37.5-25 MG PO CAPS: 1.0000 | ORAL_CAPSULE | ORAL | Status: DC

## 2011-01-24 MED ORDER — ALLOPURINOL 100 MG PO TABS: 100.0000 mg | ORAL_TABLET | Freq: Every day | ORAL | Status: DC

## 2011-01-24 NOTE — Telephone Encounter (Signed)
rx have [facility] per pts request.  Called and spoke with pt and she is aware and i called [facility] and [facility] to make sure these meds are not filled  again through [facility].

## 2011-01-25 ENCOUNTER — Other Ambulatory Visit: Payer: Self-pay | Admitting: Pulmonary Disease

## 2011-01-25 MED ORDER — ALLOPURINOL 100 MG PO TABS: 100.0000 mg | ORAL_TABLET | Freq: Every day | ORAL | Status: DC

## 2011-01-25 MED ORDER — TRIAMTERENE-HCTZ 37.5-25 MG PO CAPS: 1.0000 | ORAL_CAPSULE | ORAL | Status: DC

## 2011-03-20 ENCOUNTER — Encounter: Payer: Self-pay | Admitting: Pulmonary Disease

## 2011-03-20 ENCOUNTER — Other Ambulatory Visit (INDEPENDENT_AMBULATORY_CARE_PROVIDER_SITE_OTHER): Payer: Medicare Other

## 2011-03-20 ENCOUNTER — Ambulatory Visit (INDEPENDENT_AMBULATORY_CARE_PROVIDER_SITE_OTHER)
Admission: RE | Admit: 2011-03-20 | Discharge: 2011-03-20 | Disposition: A | Discharge status: Home / Self Care | Payer: Medicare Other | Source: Ambulatory Visit | Attending: Pulmonary Disease | Admitting: Pulmonary Disease

## 2011-03-20 ENCOUNTER — Ambulatory Visit (INDEPENDENT_AMBULATORY_CARE_PROVIDER_SITE_OTHER): Payer: Medicare Other | Admitting: Pulmonary Disease

## 2011-03-20 DIAGNOSIS — M109 Gout, unspecified: Secondary | ICD-10-CM

## 2011-03-20 DIAGNOSIS — K219 Gastro-esophageal reflux disease without esophagitis: Secondary | ICD-10-CM

## 2011-03-20 DIAGNOSIS — I10 Essential (primary) hypertension: Secondary | ICD-10-CM

## 2011-03-20 DIAGNOSIS — E039 Hypothyroidism, unspecified: Secondary | ICD-10-CM

## 2011-03-20 DIAGNOSIS — M1990 Unspecified osteoarthritis, unspecified site: Secondary | ICD-10-CM

## 2011-03-20 DIAGNOSIS — F411 Generalized anxiety disorder: Secondary | ICD-10-CM

## 2011-03-20 DIAGNOSIS — E669 Obesity, unspecified: Secondary | ICD-10-CM

## 2011-03-20 DIAGNOSIS — N259 Disorder resulting from impaired renal tubular function, unspecified: Secondary | ICD-10-CM

## 2011-03-20 DIAGNOSIS — J209 Acute bronchitis, unspecified: Secondary | ICD-10-CM

## 2011-03-20 DIAGNOSIS — G4733 Obstructive sleep apnea (adult) (pediatric): Secondary | ICD-10-CM

## 2011-03-20 DIAGNOSIS — K589 Irritable bowel syndrome without diarrhea: Secondary | ICD-10-CM

## 2011-03-20 DIAGNOSIS — E7800 Pure hypercholesterolemia, unspecified: Secondary | ICD-10-CM

## 2011-03-20 DIAGNOSIS — D3000 Benign neoplasm of unspecified kidney: Secondary | ICD-10-CM

## 2011-03-20 DIAGNOSIS — D126 Benign neoplasm of colon, unspecified: Secondary | ICD-10-CM

## 2011-03-20 DIAGNOSIS — I872 Venous insufficiency (chronic) (peripheral): Secondary | ICD-10-CM

## 2011-03-20 LAB — CBC WITH DIFFERENTIAL/PLATELET
Basophils Relative: 0.9 % (ref 0.0–3.0)
Eosinophils Relative: 3.1 % (ref 0.0–5.0)
Hemoglobin: 13.1 g/dL (ref 12.0–15.0)
Lymphocytes Relative: 55.3 % — ABNORMAL HIGH (ref 12.0–46.0)
Neutro Abs: 2.6 10*3/uL (ref 1.4–7.7)
Neutrophils Relative %: 32.5 % — ABNORMAL LOW (ref 43.0–77.0)
RBC: 4.32 Mil/uL (ref 3.87–5.11)
WBC: 8.1 10*3/uL (ref 4.5–10.5)

## 2011-03-20 LAB — BASIC METABOLIC PANEL
BUN: 17 mg/dL (ref 6–23)
CO2: 29 mEq/L (ref 19–32)
Calcium: 9.5 mg/dL (ref 8.4–10.5)
Chloride: 105 mEq/L (ref 96–112)
Creatinine, Ser: 1 mg/dL (ref 0.4–1.2)

## 2011-03-20 LAB — LIPID PANEL
Cholesterol: 156 mg/dL (ref 0–200)
HDL: 59.5 mg/dL (ref >39.00)
LDL Cholesterol: 78 mg/dL (ref 0–99)
Total CHOL/HDL Ratio: 3
Triglycerides: 92 mg/dL (ref 0.0–149.0)

## 2011-03-20 LAB — HEPATIC FUNCTION PANEL
ALT: 24 U/L (ref 0–35)
Bilirubin, Direct: 0.1 mg/dL (ref 0.0–0.3)
Total Bilirubin: 0.4 mg/dL (ref 0.3–1.2)
Total Protein: 7.6 g/dL (ref 6.0–8.3)

## 2011-03-20 NOTE — Progress Notes (Signed)
Subjective:    Patient ID: April Donaldson, female    DOB: 03-Aug-1931, 78 y.o.   MRN: [id number]  HPI 78 y/o BF here for a 6 month follow up visit... she has multiple medical problems as noted below... 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  ~  March 08, 2010:  5mo ROV- generally stable, wonders if she can stop any meds, and concerned about spot behind ear= benign SK & advised Derm for excision... notes some mild tinnitus & offered ENT eval but she declines at this time> we discussed "white noise" masking... BP remains under good control;  Chol looks good on Simva40;  Thyroid stable on Levoxyl;  weight down 5# to 186#;  GI is stable;  she had f/u DrBorden 11/11 w/ another sonar= no change AML or cyst;  she tells me she's not taking the Allopurinol every day & no gout attacks;  she is seeing a chiropractor, DrGibson, for LBP- s/p "spinal decompression" & improved...  ~  September 16, 2010:  5mo ROV & she has been stable> notes one day of abd discomfort recently (sounded like IBS- she thinks related to something that she ate) & she is offered Bentyl vs CT Abd vs GI follow up (DrGessner) but she prefers to wait & see if symptoms recur...  Stable on current meds which we again reviewed one-at-a-time since she wants to stop any unneeded medications...     [facility] records indicate she had an ENT eval by DrCrossley 5/12 w/ Ba swallow but we don't have his notes or the test results...    She saw DrBorden for her routine 5mo Urology check up (atrophic left kidney, bilat sm renal cysts,  & 0.7cm right renal angiomyolipoma); Ultrasound was essent unchanged & she was reassured...    She had f/u BMD 3/12 at [facility] w/ TScore -0.7 in FemNeck> normal...    She saw DrClance for  SleepMed f/u 2/12> stable on her CPAP...  ~  March 20, 2011:  5mo ROV & she states that she "wants to be sure that she is ok"> she had "the flu" w/ dry cough, stuffy, congested, T99+, hurting in ribs, & treated herself w/ OTC meds; then developed epistaxis & went to see DrCrossley "he said it was infection" & Rx w/ ZPak & improved; for reassurance we decided to check CXR (borderline heart size, clear lungs, TSpine w/ mild spondylosis); and LABS (FLP-ok, Chems-ok, CBC-ok, TSH-ok, VitD-57)==> See below: BP remains well controlled; denies CP or cerebral ischemic symptoms; Chol looks good on Simva40; Thyroid ok on Levo50; Renal stable w/ normal Creat; etc... She is given ample reassurance...          Problem List:  GLAUCOMA (ICD-365.9) - followed by DrShapiro on gtts... 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  OBSTRUCTIVE SLEEP APNEA (ICD-780.57) - Hx borderline sleep study in past w/ RDI=12, desat to 87%... increased symptoms of daytime hypersomn resulted in repeat sleep study 8/09 w/ RDI= 20, mild snoring, & desat to 66%- so she was started on CPAP by DrClance, adjusted to 10cm pressure and she reports doing well after mask adjustments... ~  2/12:  yearly f/u DrClance- doing well, no changes made.  HYPERTENSION (ICD-401.9) - controlled on ATENOLOL 100mg /d, LOTREL 5-10 daily, & DYAZIDE 1/d, & KCl - 2daily...  BP= 140/72 today (  similar at home)- she takes meds regularly & tolerates well... she tries to follow a low sodium diet and she has tried to count calories and lose wt... denies HA, fatigue, visual changes, CP, palipit, dizziness, syncope, dyspnea, etc...  ~  2DEcho 4/08 was normal w/ EF=55-65%...  ~  NuclearStressTest 4/08 was negative- no ischemia & EF=79% (similiar to 2002)... ~  Dobutamine Echo 4/10 was neg as well... ~  CXR 1/11 showed stable cardiomeg, ectasia of thorAo, incr markings bases, NAD.Marland Kitchen. ~  CXR 1/13 showed borderline heart size, clear lungs, TSpine w/ mild  spondylosis...  CHEST PAIN, ATYPICAL (ICD-786.59) - she went to the ER 3/10 w/ atyp CP & ruled out for MI- seen by Walker Kehr w/ neg DobutEcho 4/10 (no change in meds)...  CEREBROVASCULAR DISEASE (ICD-437.9) - on ASA 81mg /d... prev CDopplers 6/04 showed mild plaque, 0-39% bilat...  VENOUS INSUFFICIENCY (ICD-459.81) - on low sodium diet, +elevation, +support hose, & Dyazide...  HYPERCHOLESTEROLEMIA (ICD-272.0) - on SIMVASTATIN 40mg /d + diet efforts... ~  FLP 7/08 showed TChol 156, TG 118, HDL 55, LDL 78 ~  FLP 7/09 showed TChol 159, TG 142, HDL 48, LDL 83... rec- continue same. ~  FLP 7/10 showed TChol 151, TG 81, HDL 59, LDL 76... continue Simva40. ~  FLP 1/11 showed TChol 155, TG 148, HDL 48, LDL 77 ~  FLP 7/11 showed TChol 147, TG 103, HDL 56, LDL 71... continue Simva40. ~  FLP 1/12 on Simva40 showed TChol 162, TG 110, HDL 60, LDL 80 ~  FLP 1/13 on Simva40 showed TChol 156, TG 92, HDL 60, LDL 78  HYPOTHYROIDISM (ICD-244.9) - on LEVOXYL 70mcg/d> s/p R thyroid lobectomy 2002 by DrCrossley for cyst...  ~  labs 1/09 on Levoxyl50 showed TSH= 3.10 ~  labs 7/09 on Levoxyl50 showed TSH= 2.34 ~  labs 1/10 showed TSH= 3.78... continue LEVOXYL50 ~  labs 7/10 showed TSH= 2.54 ~  labs 1/11 showed TSH= 2.95 ~  labs 7/11 showed TSH= 2.64 ~  labs 1/12 on Levo50 showed TSH= 3.59 ~  Labs 1/13 on Levo50 showed TSH= 4.95  OBESITY (ICD-278.00) -  she knows that she needs to diet, exercise and get her weight down! ~  weight 1/10 = 193#,  she is 4\' 10"  tall,  BMI= 40... ~  weight 7/10 = 194# ~  weight 1/11 = 188# ~  weight 7/11 = 191# ~  weight 1/12 = 186# ~  Weight 7/12 = 187# ~  Weight 1/13 = 186#  GERD (ICD-530.81) - she uses OTC Prilosec 20mg  Prn... she saw DrGessner in Feb09- doing satis... ~  [facility] records indicate she had an ENT eval by DrCrossley 5/12 w/ Ba swallow but we don't have his notes or the test results...  IRRITABLE BOWEL SYNDROME (ICD-564.1) &  COLONIC POLYPS (ICD-211.3) -  colonoscopy 3/06 by DrSamLeBauer showed extremely tortuous colon, divertics, sm polyp... f/u 55yrs... ~  colonoscopy 3/11 by DrGessner showed divertics, stenosis in sigmoid, hems, & 2 sm polyps= adenoma fragments, f/u 18yrs.  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  ~  labs 1/09 showed BUN= 15, Creat= 1.0 ~  labs 1/10 showed BUN= 18, Creat= 1.0 ~  labs 1/11 showed BUN= 16, Creat= 1.0 ~  labs 1/12 showed BUN= 19, Creat= 0.9  ~  8/12: she had f/u DrWebb> his note is reviewed, stable, no changes made. ~  Labs 1/13 showed BUN= 17, Creat= 1.0  BENIGN NEOPLASM OF KIDNEY (ICD-223.0) - followed by DrBorden for ANGIOMYOLIPOMA (7mm right renal lesion- AML confirmed by MRI & followed by ultrasound exams) & renal cysts... she is seen every 693mo for sonars due to her severe anxiety about these benign lesions... ~  3/11: f/u DrBorden for 693mo sonar to eval angiomyolipoma right kidney- no change, reassured...  ~  11/11: f/u DrBorden w/ another sonar= no change AML or cyst;  ~  5/12:  routine 693mo f/u DrBorden w/ sonar> no change in 7mm AML right kidney, bilat renal cysts, sm right kidney... ~  11/12: f/u DrBorden for atrophic left kidney & 0.7cm AML right kidney; Sonar unchanged & she persists in wanting scans every 693mo.  DEGENERATIVE JOINT DISEASE (ICD-715.90) - she sees DrHilts for Ortho... LOW BACK PAIN, CHRONIC (ICD-724.2) - she tells me she is seeing a chiropractor, DrGibson, for "spinal decompression" & improved ~  She had f/u BMD 3/12 at [facility] w/ TScore -0.7 in FemNeck> normal...  GOUT (ICD-274.9) - Uric Acid Jul09 was 8.1 and ALLOPURINOL 100mg /d started by DrWebb... ~  labs 7/10 showed Uric= 5.8 ~  labs 1/11 showed  Uric= 5.7 ~  labs 1/12 showed Uric= 6.1  ANXIETY (ICD-300.00) - she has severe anxiety about her health status, doesn't want nerve pills.  Health Maintenance - her GYN is ?(NP Maurice March)... BMD at [facility] done 2/ 10 & improved, on calcium, vits, etc... ~  labs 7/09 showed Vit D level = 57 ~  labs 7/10 showed Vit D level = 46 ~  labs 7/11 showed Vit D level = 68   Past Surgical History  Procedure Date  . Hysterctomy and ooperectomy 1970's  . Thyroid lobectomy for a cyst 10/2000    Dr. Haroldine Laws  . Functional endoscopic sinus surgery 05/2003    Dr. Haroldine Laws    Outpatient Encounter Prescriptions as of 03/20/2011  Medication Sig Dispense Refill  . allopurinol (ZYLOPRIM) 100 MG tablet Take 1 tablet (100 mg total) by mouth daily.  30 tablet  11  . aspirin 81 MG tablet Take 81 mg by mouth daily.        Marland Kitchen atenolol (TENORMIN) 100 MG tablet TAKE 1 TABLET DAILY  90 tablet  2  . Calcium Carbonate-Vitamin D (CALTRATE 600+D) 600-400 MG-UNIT per tablet Take 1 tablet by mouth 2 (two) times daily.        . dorzolamide (TRUSOPT) 2 % ophthalmic solution Place 1 drop into both eyes 2 (two) times daily.        . Guaifenesin (MUCINEX MAXIMUM STRENGTH) 1200 MG TB12 Take 1 tablet by mouth as needed.       . latanoprost (XALATAN) 0.005 % ophthalmic solution Place 1 drop into the right eye at bedtime.        Marland Kitchen LEVOXYL 50 MCG tablet TAKE 1 TABLET DAILY  90 tablet  2  . LOTREL 5-10 MG per capsule TAKE 1 CAPSULE DAILY  90 capsule  2  . Multiple Vitamins-Minerals (CENTRUM SILVER PO) Take 1 tablet by mouth daily.        . potassium chloride (MICRO-K) 10 MEQ CR capsule       . simvastatin (ZOCOR) 40 MG tablet TAKE 1 TABLET DAILY  90 tablet  2  . triamterene-hydrochlorothiazide (DYAZIDE) 37.5-25 MG per capsule Take 1 each (1 capsule total) by mouth every morning.  30 capsule  11  .  DISCONTD: potassium chloride (MICRO-K) 10 MEQ CR capsule TAKE TWO CAPSULES TWICE DAILY                                                  GENERIC FOR  MICRO-K  120 capsule  11    Allergies  Allergen Reactions  . Amlodipine Besylate   . Clonidine     Sweating, nausea, felt faint  . Diltiazem Hcl   . Doxycycline Hyclate     REACTION:  tetracycline  . Iodine     Injection iodine--  . Labetalol Hcl   . Olmesartan Medoxomil   . Penicillins     Pt not allergic--just causes yeast infections    Current Medications, Allergies, Past Medical History, Past Surgical History, Family History, and Social History were reviewed in [employer] record.   Review of Systems        See HPI - all other systems neg except as noted...  The patient complains of dyspnea on exertion.  The patient denies anorexia, fever, weight loss, weight gain, vision loss, decreased hearing, hoarseness, chest pain, syncope, peripheral edema, prolonged cough, headaches, hemoptysis, abdominal pain, melena, hematochezia, severe indigestion/heartburn, hematuria, incontinence, muscle weakness, suspicious skin lesions, transient blindness, difficulty walking, depression, unusual weight change, abnormal bleeding, enlarged lymph nodes, and angioedema.     Objective:   Physical Exam     WD, Obese, 79 y/o BF in NAD... GENERAL:  Alert & oriented; pleasant & cooperative... HEENT:  [place]/AT, EOM-wnl, PERRLA, EACs-clear, TMs-wnl, NOSE-clear, THROAT-clear & wnl. NECK:  Supple w/ fairROM; no JVD; normal carotid impulses w/o bruits; Thyroid surg scar, no thyromegaly or nodules palpated; no lymphadenopathy. CHEST:  Clear to P & A; without wheezes/ rales/ or rhonchi heard... HEART:  Regular Rhythm; without murmurs/ rubs/ or gallops detected... ABDOMEN:  Obese soft & nontender; normal bowel sounds; no organomegaly or masses palpated... EXT: without deformities, mild arthritic changes; no varicose veins/ venous insuffic/ tr edema. NEURO:  CN's intact; no focal neuro deficits... DERM:  No lesions noted; no rash etc...  RADIOLOGY DATA:  Reviewed in the EPIC EMR &  discussed w/ the patient...    >>CXR 1/13 showed borderline heart size, clear lungs, TSpine w/ mild spondylosis...  LABORATORY DATA:  Reviewed in the EPIC EMR & discussed w/ the patient...    >>LABS 1/13 revealed FLP-ok, Chems-ok, CBC-ok, TSH-ok, VitD-57...   Assessment & Plan:   OSA>  Followed by DrClance yearly; she wear CPAP compliantly, no mask issues except when "congested", no daytime hypersomnolence etc...  HBP>  Controlled on meds as listed, continue same...  Hx AtypicCP & mild cerebrovasc plaque>  On ASA w/o ischemic symptoms...  CHOL>  Stable on diet + Simva40, needs to lose weight!  Hypothyroid>  On Levoxyl 70mcg/d, continue same...  GI>  GERD, IBS, Colon Polyps>  Followed by DrGessner, stable on OTC Prilosec but doesn't want antispasmotic etc...  GU>  Atrophic right kidney, sm renal cysts, left angiomyolipoma, normal renal function> Followed by DrBorden every 70mo...  DJD/ GOUT>  Stable w/o acute gout attacks...  Anxiety>  She still declines anxiolytic Rx.Marland KitchenMarland Kitchen

## 2011-03-20 NOTE — Patient Instructions (Signed)
Today we updated your med list in our EPIC system...    Continue your current medications the same...    Continue the MUCINEX 2 tabs twice daily w/ lots of fluids...    Add a Nasal Saline wash> spray your nose every 1-2 H to keep it clean & moist...  Today we did your follow up CXR & fasting blood work...    Please call the PHONE TREE in a few days for your results...    Dial [id number] & when prompted enter your patient number followed by the # symbol...    Your patient number is:   [phone]  Call for any questions...  Let's plan a mid year check up in about 6 months.Marland KitchenMarland Kitchen

## 2011-03-21 LAB — VITAMIN D 25 HYDROXY (VIT D DEFICIENCY, FRACTURES): Vit D, 25-Hydroxy: 57 ng/mL (ref 30–89)

## 2011-03-22 ENCOUNTER — Encounter: Payer: Self-pay | Admitting: Pulmonary Disease

## 2011-04-14 ENCOUNTER — Encounter: Payer: Self-pay | Admitting: *Deleted

## 2011-04-26 ENCOUNTER — Ambulatory Visit: Payer: Medicare Other | Admitting: Pulmonary Disease

## 2011-05-05 ENCOUNTER — Ambulatory Visit (INDEPENDENT_AMBULATORY_CARE_PROVIDER_SITE_OTHER): Payer: Medicare Other | Admitting: Pulmonary Disease

## 2011-05-05 ENCOUNTER — Encounter: Payer: Self-pay | Admitting: Pulmonary Disease

## 2011-05-05 VITALS — BP 120/70 | HR 55 | Temp 98.1°F | Ht 59.0 in | Wt 191.2 lb

## 2011-05-05 DIAGNOSIS — G4733 Obstructive sleep apnea (adult) (pediatric): Secondary | ICD-10-CM

## 2011-05-05 NOTE — Progress Notes (Signed)
  Subjective:    Patient ID: April Donaldson, female    DOB: 10/23/1931, 78 y.o.   MRN: [id number]  HPI The patient comes in today for followup of her known obstructive sleep apnea.  She is wearing CPAP fairly compliantly, and denies any significant mask or pressure issues.  She does not like wearing the device, but clearly feels her sleep is much improved and her alertness is better on the nights that she wears.   Review of Systems  Constitutional: Negative for fever and unexpected weight change.  HENT: Positive for sinus pressure. Negative for ear pain, nosebleeds, congestion, sore throat, rhinorrhea, sneezing, trouble swallowing, dental problem and postnasal drip.   Eyes: Negative for redness and itching.  Respiratory: Negative for cough, chest tightness, shortness of breath and wheezing.   Cardiovascular: Positive for chest pain. Negative for palpitations and leg swelling.  Gastrointestinal: Negative for nausea and vomiting.  Genitourinary: Negative for dysuria.  Musculoskeletal: Negative for joint swelling.  Skin: Negative for rash.  Neurological: Negative for headaches.  Hematological: Does not bruise/bleed easily.  Psychiatric/Behavioral: Negative for dysphoric mood. The patient is not nervous/anxious.        Objective:   Physical Exam Overweight female in no acute distress No skin breakdown or pressure necrosis from the CPAP mask Lower extremities with mild edema, no cyanosis Alert and oriented, moves all 4 extremities.       Assessment & Plan:

## 2011-05-05 NOTE — Patient Instructions (Signed)
Stay on cpap, and call if issues with tolerance Work on weight loss followup with me in one year.

## 2011-05-05 NOTE — Assessment & Plan Note (Signed)
The patient is doing fairly well on CPAP, and I have asked her to be as compliant as possible with the device.  I've encouraged her to work aggressively on weight loss, and also to keep up with supplies and mask changes.  She will followup with me in one year if stable.

## 2011-05-15 ENCOUNTER — Telehealth: Payer: Self-pay | Admitting: Pulmonary Disease

## 2011-05-15 MED ORDER — METHYLPREDNISOLONE 4 MG PO KIT: PACK | ORAL | Status: AC

## 2011-05-15 NOTE — Telephone Encounter (Signed)
Per SN---increase the mucinex 600mg     2 po bid with plenty of fluids and medrol dosepak  #1  Take as directed with no refills.  thanks

## 2011-05-15 NOTE — Telephone Encounter (Signed)
I spoke with pt and she c/o dry cough, sore throat, sneezing, loss of appetite, nasal congestions, PND x wed. Denies any fever, chills. Sweats, nausea, vomiting. Pt is drinking plenty of fluids and taking mucinex QD and is resting. Pt is requesting to be seen but nothing available. Please advise Dr. Kriste Basque, thanks  Allergies  Allergen Reactions  . Amlodipine Besylate   . Clonidine     Sweating, nausea, felt faint  . Diltiazem Hcl   . Doxycycline Hyclate     REACTION:  tetracycline  . Iodine     Injection iodine--  . Labetalol Hcl   . Olmesartan Medoxomil   . Penicillins     Pt not allergic--just causes yeast infections     [facility]

## 2011-05-15 NOTE — Telephone Encounter (Signed)
I spoke with pt and is aware of SN recs. She voiced her understanding and rx has been sent to the pharmacy. Nothing further was needed 

## 2011-06-23 ENCOUNTER — Ambulatory Visit (INDEPENDENT_AMBULATORY_CARE_PROVIDER_SITE_OTHER): Payer: Medicare Other | Admitting: Adult Health

## 2011-06-23 ENCOUNTER — Encounter: Payer: Self-pay | Admitting: Adult Health

## 2011-06-23 VITALS — BP 134/70 | HR 61 | Temp 97.9°F | Ht 59.0 in | Wt 189.8 lb

## 2011-06-23 DIAGNOSIS — M1990 Unspecified osteoarthritis, unspecified site: Secondary | ICD-10-CM

## 2011-06-23 MED ORDER — MELOXICAM 7.5 MG PO TABS: 7.5000 mg | ORAL_TABLET | Freq: Every day | ORAL | Status: DC | PRN

## 2011-06-23 MED ORDER — METAXALONE 800 MG PO TABS: 400.0000 mg | ORAL_TABLET | Freq: Two times a day (BID) | ORAL | Status: AC | PRN

## 2011-06-23 NOTE — Assessment & Plan Note (Signed)
DJD flare with neck strain   Plan:  Mobic 7.5mg  daily for 1 week then as needed for joint pain. -take w/ food.  Skelaxin 800mg  1/2 Twice daily  For muscle spasm .  Alternate ice and heat  Please contact office for sooner follow up if symptoms do not improve or worsen or seek emergency care

## 2011-06-23 NOTE — Progress Notes (Signed)
Subjective:    Patient ID: April Donaldson, female    DOB: 07-02-1931, 78 y.o.   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  ~  March 08, 2010:  34mo ROV- generally stable, wonders if she can stop any meds, and concerned about spot behind ear= benign SK & advised Derm for excision... notes some mild tinnitus & offered ENT eval but she declines at this time> we discussed "white noise" masking... BP remains under good control;  Chol looks good on Simva40;  Thyroid stable on Levoxyl;  weight down 5# to 186#;  GI is stable;  she had f/u DrBorden 11/11 w/ another sonar= no change AML or cyst;  she tells me she's not taking the Allopurinol every day & no gout attacks;  she is seeing a chiropractor, DrGibson, for LBP- s/p "spinal decompression" & improved...  ~  September 16, 2010:  34mo ROV & she has been stable> notes one day of abd discomfort recently (sounded like IBS- she thinks related to something that she ate) & she is offered Bentyl vs CT Abd vs GI follow up (DrGessner) but she prefers to wait & see if symptoms recur...  Stable on current meds which we again reviewed one-at-a-time since she wants to stop any unneeded medications...     [facility] records indicate she had an ENT eval by DrCrossley 5/12 w/ Ba swallow but we don't have his notes or the test results...    She saw DrBorden for her routine 34mo Urology check up (atrophic left kidney, bilat sm renal cysts,  & 0.7cm right renal angiomyolipoma); Ultrasound was essent unchanged & she was reassured...    She had f/u BMD 3/12 at [facility] w/ TScore -0.7 in FemNeck> normal...    She saw DrClance for SleepMed f/u 2/12> stable on her CPAP...  ~  March 20, 2011:  34mo ROV & she states that  she "wants to be sure that she is ok"> she had "the flu" w/ dry cough, stuffy, congested, T99+, hurting in ribs, & treated herself w/ OTC meds; then developed epistaxis & went to see DrCrossley "he said it was infection" & Rx w/ ZPak & improved; for reassurance we decided to check CXR (borderline heart size, clear lungs, TSpine w/ mild spondylosis); and LABS (FLP-ok, Chems-ok, CBC-ok, TSH-ok, VitD-57)==> See below: BP remains well controlled; denies CP or cerebral ischemic symptoms; Chol looks good on Simva40; Thyroid ok on Levo50; Renal stable w/ normal Creat; etc... She is given ample reassurance...  /06/23/2011 Acute OV  Complains of left-sided neck pain x1 week - worse with turning, can radiate behind the left ear. Sore in left side of neck and upper back. Tender to touch, no rash or extremity weakness. No known injury . No radicular pain.  Pain with turning head . No otc used.             Problem List:  GLAUCOMA (ICD-365.9) - followed by DrShapiro on gtts... 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  OBSTRUCTIVE SLEEP APNEA (ICD-780.57) - Hx borderline sleep study in past w/ RDI=12, desat to 87%... increased symptoms of daytime hypersomn resulted in repeat sleep study 8/09 w/ RDI= 20, mild snoring, & desat to 66%- so she was started on CPAP by DrClance, adjusted to 10cm  pressure and she reports doing well after mask adjustments... ~  2/12:  yearly f/u DrClance- doing well, no changes made.  HYPERTENSION (ICD-401.9) - controlled on ATENOLOL 100mg /d, LOTREL 5-10 daily, & DYAZIDE 1/d, & KCl - 2daily...  BP= 140/72 today (similar at home)- she takes meds regularly & tolerates well... she tries to follow a low sodium diet and she has tried to count calories and lose wt... denies HA, fatigue, visual changes, CP, palipit, dizziness, syncope, dyspnea, etc...  ~  2DEcho 4/08 was normal w/ EF=55-65%...  ~  NuclearStressTest 4/08 was negative- no ischemia & EF=79% (similiar to 2002)... ~  Dobutamine  Echo 4/10 was neg as well... ~  CXR 1/11 showed stable cardiomeg, ectasia of thorAo, incr markings bases, NAD.Marland Kitchen. ~  CXR 1/13 showed borderline heart size, clear lungs, TSpine w/ mild spondylosis...  CHEST PAIN, ATYPICAL (ICD-786.59) - she went to the ER 3/10 w/ atyp CP & ruled out for MI- seen by Walker Kehr w/ neg DobutEcho 4/10 (no change in meds)...  CEREBROVASCULAR DISEASE (ICD-437.9) - on ASA 81mg /d... prev CDopplers 6/04 showed mild plaque, 0-39% bilat...  VENOUS INSUFFICIENCY (ICD-459.81) - on low sodium diet, +elevation, +support hose, & Dyazide...  HYPERCHOLESTEROLEMIA (ICD-272.0) - on SIMVASTATIN 40mg /d + diet efforts... ~  FLP 7/08 showed TChol 156, TG 118, HDL 55, LDL 78 ~  FLP 7/09 showed TChol 159, TG 142, HDL 48, LDL 83... rec- continue same. ~  FLP 7/10 showed TChol 151, TG 81, HDL 59, LDL 76... continue Simva40. ~  FLP 1/11 showed TChol 155, TG 148, HDL 48, LDL 77 ~  FLP 7/11 showed TChol 147, TG 103, HDL 56, LDL 71... continue Simva40. ~  FLP 1/12 on Simva40 showed TChol 162, TG 110, HDL 60, LDL 80 ~  FLP 1/13 on Simva40 showed TChol 156, TG 92, HDL 60, LDL 78  HYPOTHYROIDISM (ICD-244.9) - on LEVOXYL 58mcg/d> s/p R thyroid lobectomy 2002 by DrCrossley for cyst...  ~  labs 1/09 on Levoxyl50 showed TSH= 3.10 ~  labs 7/09 on Levoxyl50 showed TSH= 2.34 ~  labs 1/10 showed TSH= 3.78... continue LEVOXYL50 ~  labs 7/10 showed TSH= 2.54 ~  labs 1/11 showed TSH= 2.95 ~  labs 7/11 showed TSH= 2.64 ~  labs 1/12 on Levo50 showed TSH= 3.59 ~  Labs 1/13 on Levo50 showed TSH= 4.95  OBESITY (ICD-278.00) -  she knows that she needs to diet, exercise and get her weight down! ~  weight 1/10 = 193#,  she is 4\' 10"  tall,  BMI= 40... ~  weight 7/10 = 194# ~  weight 1/11 = 188# ~  weight 7/11 = 191# ~  weight 1/12 = 186# ~  Weight 7/12 = 187# ~  Weight 1/13 = 186#  GERD (ICD-530.81) - she uses OTC Prilosec 20mg  Prn... she saw DrGessner in Feb09- doing satis... ~  [facility] records indicate  she had an ENT eval by DrCrossley 5/12 w/ Ba swallow but we don't have his notes or the test results...  IRRITABLE BOWEL SYNDROME (ICD-564.1) &  COLONIC POLYPS (ICD-211.3) - colonoscopy 3/06 by DrSamLeBauer showed extremely tortuous colon, divertics, sm polyp... f/u 68yrs... ~  colonoscopy 3/11 by DrGessner showed divertics, stenosis in sigmoid, hems, & 2 sm polyps= adenoma fragments, f/u 83yrs.  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  ~  labs 1/09 showed BUN= 15, Creat= 1.0 ~  labs 1/10 showed BUN= 18, Creat= 1.0 ~  labs 1/11 showed BUN= 16, Creat= 1.0 ~  labs 1/12 showed BUN= 19, Creat= 0.9  ~  8/12: she had f/u DrWebb> his note is reviewed, stable, no changes made. ~  Labs 1/13 showed BUN= 17, Creat= 1.0  BENIGN NEOPLASM OF KIDNEY (ICD-223.0) - followed by DrBorden for ANGIOMYOLIPOMA (7mm right renal lesion- AML confirmed by MRI & followed by ultrasound exams) & renal cysts... she is seen every 63mo for sonars due to her severe anxiety about these benign lesions... ~  3/11: f/u DrBorden for 63mo sonar to eval angiomyolipoma right kidney- no change, reassured...  ~  11/11: f/u DrBorden w/ another sonar= no change AML or cyst;  ~  5/12:  routine 63mo f/u DrBorden w/ sonar> no change in 7mm AML right kidney, bilat renal cysts, sm right kidney... ~  11/12: f/u DrBorden for atrophic left kidney & 0.7cm AML right kidney; Sonar unchanged & she persists in wanting scans every 63mo.  DEGENERATIVE JOINT DISEASE (ICD-715.90) - she sees DrHilts for Ortho... LOW BACK PAIN, CHRONIC (ICD-724.2) - she tells me she is seeing a chiropractor, DrGibson, for "spinal decompression" & improved ~  She had f/u BMD 3/12 at [facility] w/  TScore -0.7 in FemNeck> normal...  GOUT (ICD-274.9) - Uric Acid Jul09 was 8.1 and ALLOPURINOL 100mg /d started by DrWebb... ~  labs 7/10 showed Uric= 5.8 ~  labs 1/11 showed Uric= 5.7 ~  labs 1/12 showed Uric= 6.1  ANXIETY (ICD-300.00) - she has severe anxiety about her health status, doesn't want nerve pills.  Health Maintenance - her GYN is ?(NP Maurice March)... BMD at [facility] done 2/ 10 & improved, on calcium, vits, etc... ~  labs 7/09 showed Vit D level = 57 ~  labs 7/10 showed Vit D level = 46 ~  labs 7/11 showed Vit D level = 68   Past Surgical History  Procedure Date  . Hysterctomy and ooperectomy 1970's  . Thyroid lobectomy for a cyst 10/2000    Dr. Haroldine Laws  . Functional endoscopic sinus surgery 05/2003    Dr. Haroldine Laws    Outpatient Encounter Prescriptions as of 06/23/2011  Medication Sig Dispense Refill  . allopurinol (ZYLOPRIM) 100 MG tablet Take 1 tablet (100 mg total) by mouth daily.  30 tablet  11  . aspirin 81 MG tablet Take 81 mg by mouth daily.        Marland Kitchen atenolol (TENORMIN) 100 MG tablet TAKE 1 TABLET DAILY  90 tablet  2  . Calcium Carbonate-Vitamin D (CALTRATE 600+D) 600-400 MG-UNIT per tablet Take 1 tablet by mouth 2 (two) times daily.        . dorzolamide-timolol (COSOPT) 22.3-6.8 MG/ML ophthalmic solution Place 1 drop into both eyes 2 (two) times daily.      . Guaifenesin (MUCINEX MAXIMUM STRENGTH) 1200 MG TB12 Take 1 tablet by mouth as needed.       . latanoprost (XALATAN) 0.005 % ophthalmic solution Place 1 drop into the right eye at bedtime.        Marland Kitchen LEVOXYL 50 MCG tablet TAKE 1 TABLET DAILY  90 tablet  2  . LOTREL 5-10 MG per capsule TAKE 1 CAPSULE DAILY  90 capsule  2  . Multiple Vitamins-Minerals (CENTRUM SILVER PO) Take 1 tablet by mouth daily.        . potassium chloride (MICRO-K) 10 MEQ CR capsule Take by  mouth 2 (two) times daily.       . simvastatin (ZOCOR) 40 MG tablet TAKE 1 TABLET DAILY  90 tablet  2  . triamterene-hydrochlorothiazide (DYAZIDE) 37.5-25 MG per  capsule Take 1 each (1 capsule total) by mouth every morning.  30 capsule  11  . DISCONTD: dorzolamide (TRUSOPT) 2 % ophthalmic solution Place 1 drop into both eyes 2 (two) times daily.          Allergies  Allergen Reactions  . Amlodipine Besylate   . Clonidine     Sweating, nausea, felt faint  . Diltiazem Hcl   . Doxycycline Hyclate     REACTION:  tetracycline  . Iodine     Injection iodine--  . Labetalol Hcl   . Olmesartan Medoxomil   . Penicillins     Pt not allergic--just causes yeast infections    Current Medications, Allergies, Past Medical History, Past Surgical History, Family History, and Social History were reviewed in [employer] record.   Review of Systems Constitutional:   No  weight loss, night sweats,  Fevers, chills, fatigue, or  lassitude.  HEENT:   No headaches,  Difficulty swallowing,  Tooth/dental problems, or  Sore throat,                No sneezing, itching, ear ache, nasal congestion, post nasal drip,   CV:  No chest pain,  Orthopnea, PND, swelling in lower extremities, anasarca, dizziness, palpitations, syncope.   GI  No heartburn, indigestion, abdominal pain, nausea, vomiting, diarrhea, change in bowel habits, loss of appetite, bloody stools.   Resp: No shortness of breath with exertion or at rest.  No excess mucus, no productive cough,  No non-productive cough,  No coughing up of blood.  No change in color of mucus.  No wheezing.  No chest wall deformity  Skin: no rash or lesions.  GU: no dysuria, change in color of urine, no urgency or frequency.  No flank pain, no hematuria   MS:  No joint  swelling.    Marland Kitchen Psych:  No change in mood or affect. No depression or anxiety.  No memory loss.             Objective:   Physical Exam     WD, Obese, 79 y/o BF in NAD... GENERAL:  Alert & oriented; pleasant & cooperative... HEENT:  [place]/AT, EOM-wnl, PERRLA, EACs-clear, TMs-wnl, NOSE-clear, THROAT-clear & wnl. NECK:  Supple w/  fairROM; no JVD; normal carotid impulses w/o bruits; Thyroid surg scar, no thyromegaly or nodules palpated; no lymphadenopathy. CHEST:  Clear to P & A; without wheezes/ rales/ or rhonchi heard... HEART:  Regular Rhythm; without murmurs/ rubs/ or gallops detected... ABDOMEN:  Obese soft & nontender; normal bowel sounds; no organomegaly or masses palpated... EXT: without deformities, mild arthritic changes; no varicose veins/ venous insuffic/ tr edema. Tender along left lateral neck, no swelling noted, nml hand grips, shoulder rom nml .  NEURO:  CN's intact; no focal neuro deficits... DERM:  No lesions noted; no rash etc...     Assessment & Plan:

## 2011-06-23 NOTE — Patient Instructions (Signed)
Mobic 7.5mg  daily for 1 week then as needed for joint pain. -take w/ food.  Skelaxin 800mg  1/2 Twice daily  For muscle spasm .  Alternate ice and heat  Please contact office for sooner follow up if symptoms do not improve or worsen or seek emergency care

## 2011-06-25 ENCOUNTER — Other Ambulatory Visit: Payer: Self-pay | Admitting: Pulmonary Disease

## 2011-07-12 ENCOUNTER — Other Ambulatory Visit: Payer: Self-pay | Admitting: Pulmonary Disease

## 2011-07-14 ENCOUNTER — Telehealth: Payer: Self-pay | Admitting: Pulmonary Disease

## 2011-07-14 NOTE — Telephone Encounter (Signed)
Spoke to pt and she states she is unable to take the generic per dr webb who is the original prescriber

## 2011-07-14 NOTE — Telephone Encounter (Signed)
Spoke with [facility] and made them aware per the pt she is unable to take the generic of the lotrel

## 2011-07-21 ENCOUNTER — Encounter: Payer: Self-pay | Admitting: Pulmonary Disease

## 2011-07-26 ENCOUNTER — Other Ambulatory Visit: Payer: Self-pay | Admitting: *Deleted

## 2011-07-26 MED ORDER — POTASSIUM CHLORIDE ER 10 MEQ PO CPCR: ORAL_CAPSULE | ORAL | Status: DC

## 2011-09-18 ENCOUNTER — Encounter: Payer: Self-pay | Admitting: Pulmonary Disease

## 2011-09-18 ENCOUNTER — Ambulatory Visit (INDEPENDENT_AMBULATORY_CARE_PROVIDER_SITE_OTHER): Payer: Medicare Other | Admitting: Pulmonary Disease

## 2011-09-18 VITALS — BP 118/66 | HR 57 | Temp 97.3°F | Ht 59.0 in | Wt 188.6 lb

## 2011-09-18 DIAGNOSIS — K589 Irritable bowel syndrome without diarrhea: Secondary | ICD-10-CM

## 2011-09-18 DIAGNOSIS — F411 Generalized anxiety disorder: Secondary | ICD-10-CM

## 2011-09-18 DIAGNOSIS — I872 Venous insufficiency (chronic) (peripheral): Secondary | ICD-10-CM

## 2011-09-18 DIAGNOSIS — I10 Essential (primary) hypertension: Secondary | ICD-10-CM

## 2011-09-18 DIAGNOSIS — I679 Cerebrovascular disease, unspecified: Secondary | ICD-10-CM

## 2011-09-18 DIAGNOSIS — E7800 Pure hypercholesterolemia, unspecified: Secondary | ICD-10-CM

## 2011-09-18 DIAGNOSIS — E039 Hypothyroidism, unspecified: Secondary | ICD-10-CM

## 2011-09-18 DIAGNOSIS — J309 Allergic rhinitis, unspecified: Secondary | ICD-10-CM

## 2011-09-18 DIAGNOSIS — E669 Obesity, unspecified: Secondary | ICD-10-CM

## 2011-09-18 DIAGNOSIS — D3000 Benign neoplasm of unspecified kidney: Secondary | ICD-10-CM

## 2011-09-18 DIAGNOSIS — G4733 Obstructive sleep apnea (adult) (pediatric): Secondary | ICD-10-CM

## 2011-09-18 DIAGNOSIS — K219 Gastro-esophageal reflux disease without esophagitis: Secondary | ICD-10-CM

## 2011-09-18 DIAGNOSIS — D126 Benign neoplasm of colon, unspecified: Secondary | ICD-10-CM

## 2011-09-18 DIAGNOSIS — M109 Gout, unspecified: Secondary | ICD-10-CM

## 2011-09-18 MED ORDER — LEVOTHYROXINE SODIUM 50 MCG PO TABS: 50.0000 ug | ORAL_TABLET | Freq: Every day | ORAL | Status: DC

## 2011-09-18 NOTE — Progress Notes (Addendum)
Subjective:    Patient ID: April Donaldson, female    DOB: 10-31-1931, 78 y.o.   MRN: [id number]  HPI 78 y/o BF here for a 6 month follow up visit... she has multiple medical problems as noted below... 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  ~  March 08, 2010:  31mo ROV- generally stable, wonders if she can stop any meds, and concerned about spot behind ear= benign SK & advised Derm for excision... notes some mild tinnitus & offered ENT eval but she declines at this time> we discussed "white noise" masking... BP remains under good control;  Chol looks good on Simva40;  Thyroid stable on Levoxyl;  weight down 5# to 186#;  GI is stable;  she had f/u DrBorden 11/11 w/ another sonar= no change AML or cyst;  she tells me she's not taking the Allopurinol every day & no gout attacks;  she is seeing a chiropractor, DrGibson, for LBP- s/p "spinal decompression" & improved...  ~  September 16, 2010:  31mo ROV & she has been stable> notes one day of abd discomfort recently (sounded like IBS- she thinks related to something that she ate) & she is offered Bentyl vs CT Abd vs GI follow up (DrGessner) but she prefers to wait & see if symptoms recur...  Stable on current meds which we again reviewed one-at-a-time since she wants to stop any unneeded medications...     [facility] records indicate she had an ENT eval by DrCrossley 5/12 w/ Ba swallow but we don't have his notes or the test results...    She saw DrBorden for her routine 31mo Urology check up (atrophic left kidney, bilat sm renal cysts,  & 0.7cm right renal angiomyolipoma); Ultrasound was essent unchanged & she was reassured...    She had f/u BMD 3/12 at [facility] w/ TScore -0.7 in FemNeck> normal...    She saw DrClance for  SleepMed f/u 2/12> stable on her CPAP...  ~  March 20, 2011:  31mo ROV & she states that she "wants to be sure that she is ok"> she had "the flu" w/ dry cough, stuffy, congested, T99+, hurting in ribs, & treated herself w/ OTC meds; then developed epistaxis & went to see DrCrossley "he said it was infection" & Rx w/ ZPak & improved; for reassurance we decided to check CXR (borderline heart size, clear lungs, TSpine w/ mild spondylosis); and LABS (FLP-ok, Chems-ok, CBC-ok, TSH-ok, VitD-57)==> See below: BP remains well controlled; denies CP or cerebral ischemic symptoms; Chol looks good on Simva40; Thyroid ok on Levo50; Renal stable w/ normal Creat; etc... She is given ample reassurance...  ~  September 18, 2011:  31mo ROV & she reports "somewhat OK" w/ mult somatic complaints & questions today; she's been on Levoxyl & no longer avail but she's worried how the generic "will react w/ my body"- I reassured her;  "I'm getting speckled" c/o brown spots (nevi), red spots (cherry hemangiomas), & encouraged to gewt "body scan" w. Vaughan Sine;  She had [facility] screen- all neg/ normal x CDoppler w/ mod left carotid dis ident> we discussed the need for formal CDopplers, note- no bruits heard...  Note- she had last Tetanus 2002 & due for TDAP today...    We reviewed prob list, meds, xrays and labs> see below for updates>>  Carotid Dopplers ==> 7/13 showed mild heterogenous plaque- not hemodynam signif- velocities wnl, 0-39% bilat ICA stenoses; plan repeat 42yrs...          Problem List:  GLAUCOMA (ICD-365.9) - followed by DrShapiro on gtts... 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  OBSTRUCTIVE SLEEP APNEA (ICD-780.57) - Hx borderline sleep study in past w/ RDI=12, desat to 87%... increased symptoms of daytime hypersomn resulted in repeat sleep study 8/09 w/ RDI= 20, mild snoring, & desat to 66%- so she was started on CPAP by DrClance, adjusted to 10cm pressure and she reports doing well after mask adjustments... ~  2/12:   yearly f/u DrClance- doing well, no changes made. ~  3/13:  She saw DrClance in follow up> using CPAP compliantly, no mask issues, daytime alertness is iproved, asked to work on wt reduction.  HYPERTENSION (ICD-401.9) - controlled on ATENOLOL 100mg /d, LOTREL 5-10 daily, & DYAZIDE 1/d, & KCl - 2daily...  BP= 140/72 today (similar at home)- she takes meds regularly & tolerates well... she tries to follow a low sodium diet and she has tried to count calories and lose wt... denies HA, fatigue, visual changes, CP, palipit, dizziness, syncope, dyspnea, etc...  ~  2DEcho 4/08 was normal w/ EF=55-65%...  ~  NuclearStressTest 4/08 was negative- no ischemia & EF=79% (similiar to 2002)... ~  Dobutamine Echo 4/10 was neg as well... ~  CXR 1/11 showed stable cardiomeg, ectasia of thorAo, incr markings bases, NAD.Marland Kitchen. ~  CXR 1/13 showed borderline heart size, clear lungs, TSpine w/ mild spondylosis...  CHEST PAIN, ATYPICAL (ICD-786.59) - she went to the ER 3/10 w/ atyp CP & ruled out for MI- seen by Walker Kehr w/ neg DobutEcho 4/10 (no change in meds)...  CEREBROVASCULAR DISEASE (ICD-437.9) - on ASA 81mg /d... prev CDopplers 6/04 showed mild plaque, 0-39% bilat... ~  CDopplers 7/13 showed mild heterogenous plaque- not hemodynam signif- velocities wnl, 0-39% bilat ICA stenoses; plan repeat 63yrs...  VENOUS INSUFFICIENCY (ICD-459.81) - on low sodium diet, +elevation, +support hose, & Dyazide...  HYPERCHOLESTEROLEMIA (ICD-272.0) - on SIMVASTATIN 40mg /d + diet efforts... ~  FLP 7/08 showed TChol 156, TG 118, HDL 55, LDL 78 ~  FLP 7/09 showed TChol 159, TG 142, HDL 48, LDL 83... rec- continue same. ~  FLP 7/10 showed TChol 151, TG 81, HDL 59, LDL 76... continue Simva40. ~  FLP 1/11 showed TChol 155, TG 148, HDL 48, LDL 77 ~  FLP 7/11 showed TChol 147, TG 103, HDL 56, LDL 71... continue Simva40. ~  FLP 1/12 on Simva40 showed TChol 162, TG 110, HDL 60, LDL 80 ~  FLP 1/13 on Simva40 showed TChol 156, TG 92, HDL 60,  LDL 78  HYPOTHYROIDISM (ICD-244.9) - on LEVOXYL 75mcg/d> s/p R thyroid lobectomy 2002 by DrCrossley for cyst...  ~  labs 1/09 on Levoxyl50 showed TSH= 3.10 ~  labs 7/09 on Levoxyl50 showed TSH= 2.34 ~  labs 1/10 showed TSH= 3.78... continue LEVOXYL50 ~  labs 7/10 showed TSH= 2.54 ~  labs 1/11 showed TSH= 2.95 ~  labs 7/11 showed TSH= 2.64 ~  labs 1/12 on Levo50 showed TSH= 3.59 ~  Labs 1/13 on Levo50 showed TSH= 4.95  OBESITY (ICD-278.00) -  she knows that she needs to diet, exercise and get her weight down! ~  weight 1/10 = 193#,  she is 4\' 10"  tall,  BMI= 40... ~  weight 7/10 = 194# ~  weight 1/11 = 188# ~  weight 7/11 = 191# ~  weight 1/12 = 186# ~  Weight  7/12 = 187# ~  Weight 1/13 = 186# ~  Weight 7/13 = 189  GERD (ICD-530.81) - she uses OTC Prilosec 20mg  Prn... she saw DrGessner in Feb09- doing satis... ~  [facility] records indicate she had an ENT eval by DrCrossley 5/12 w/ Ba swallow but we don't have his notes or the test results...  IRRITABLE BOWEL SYNDROME (ICD-564.1) &  COLONIC POLYPS (ICD-211.3) - colonoscopy 3/06 by DrSamLeBauer showed extremely tortuous colon, divertics, sm polyp... f/u 53yrs... ~  colonoscopy 3/11 by DrGessner showed divertics, stenosis in sigmoid, hems, & 2 sm polyps= adenoma fragments, f/u 28yrs.  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  ~  labs 1/09 showed BUN= 15, Creat= 1.0 ~  labs 1/10 showed BUN= 18, Creat= 1.0 ~  labs 1/11 showed BUN= 16, Creat= 1.0 ~  labs 1/12 showed BUN= 19, Creat= 0.9  ~  8/12: she had f/u DrWebb> his note is reviewed, stable, no changes made. ~  Labs 1/13 showed BUN= 17, Creat= 1.0  BENIGN NEOPLASM OF KIDNEY (ICD-223.0) - followed by DrBorden  for ANGIOMYOLIPOMA (7mm right renal lesion- AML confirmed by MRI & followed by ultrasound exams) & renal cysts... she is seen every 63mo for sonars due to her severe anxiety about these benign lesions... ~  3/11: f/u DrBorden for 63mo sonar to eval angiomyolipoma right kidney- no change, reassured...  ~  11/11: f/u DrBorden w/ another sonar= no change AML or cyst;  ~  5/12:  routine 63mo f/u DrBorden w/ sonar> no change in 7mm AML right kidney, bilat renal cysts, sm right kidney... ~  11/12: f/u DrBorden for atrophic left kidney & 0.7cm AML right kidney; Sonar unchanged & she persists in wanting scans every 63mo. ~  5/13:  F/u DrBorden w/ atrophic left kidney & 0.7cm angiomyolipoma right kid; she requests sonar Q15m due to her extreme anxiety & need for constant reassurance- no ch on sonar.  DEGENERATIVE JOINT DISEASE (ICD-715.90) - she sees DrHilts for Ortho... LOW BACK PAIN, CHRONIC (ICD-724.2) - she tells me she is seeing a chiropractor, DrGibson, for "spinal decompression" & improved ~  She had [facility] BMD 3/12 [facility] w/ TScore -0.7 in FemNeck> normal...  GOUT (ICD-274.9) - Uric Acid Jul09 was 8.1 and ALLOPURINOL 100mg /d started by DrWebb... ~  labs 7/10 showed Uric= 5.8 ~  labs 1/11 showed Uric= 5.7 ~  labs 1/12 showed Uric= 6.1  ANXIETY (ICD-300.00) - she has severe anxiety about her health status, doesn't want nerve pills.  Health Maintenance - her GYN is ?(NP Maurice March)... BMD at [facility] done 2/ 10 & improved, on calcium, vits, etc... ~  labs 7/09 showed Vit D level = 57 ~  labs 7/10 showed Vit D level = 46 ~  labs 7/11 showed Vit D level = 68   Past Surgical History  Procedure Date  . Hysterctomy and ooperectomy 1970's  . Thyroid lobectomy for a cyst 10/2000    Dr. Haroldine Laws  . Functional endoscopic sinus surgery 05/2003    Dr. Haroldine Laws    Outpatient Encounter Prescriptions as of 09/18/2011  Medication Sig Dispense Refill  . allopurinol (ZYLOPRIM) 100 MG tablet Take 1 tablet (100 mg  total) by mouth daily.  30 tablet  11  . aspirin 81 MG tablet Take  81 mg by mouth daily.        Marland Kitchen atenolol (TENORMIN) 100 MG tablet TAKE 1 TABLET DAILY  90 tablet  1  . Calcium Carbonate-Vitamin D (CALTRATE 600+D) 600-400 MG-UNIT per tablet Take 1 tablet by mouth 2 (two) times daily.        . dorzolamide-timolol (COSOPT) 22.3-6.8 MG/ML ophthalmic solution Place 1 drop into both eyes 2 (two) times daily.      . Guaifenesin (MUCINEX MAXIMUM STRENGTH) 1200 MG TB12 Take 1 tablet by mouth as needed.       . latanoprost (XALATAN) 0.005 % ophthalmic solution Place 1 drop into the right eye at bedtime.        Marland Kitchen LEVOXYL 50 MCG tablet TAKE 1 TABLET DAILY  90 tablet  1  . LOTREL 5-10 MG per capsule TAKE 1 CAPSULE DAILY  90 capsule  1  . Multiple Vitamins-Minerals (CENTRUM SILVER PO) Take 1 tablet by mouth daily.        . potassium chloride (MICRO-K) 10 MEQ CR capsule Take 10 mEq by mouth 2 (two) times daily.      . simvastatin (ZOCOR) 40 MG tablet TAKE 1 TABLET DAILY  90 tablet  1  . triamterene-hydrochlorothiazide (DYAZIDE) 37.5-25 MG per capsule Take 1 each (1 capsule total) by mouth every morning.  30 capsule  11  . DISCONTD: potassium chloride (MICRO-K) 10 MEQ CR capsule Take 2 tablets by mouth daily  60 capsule  5  . meloxicam (MOBIC) 7.5 MG tablet Take 1 tablet (7.5 mg total) by mouth daily as needed for pain. Take with food  30 tablet  0    Allergies  Allergen Reactions  . Amlodipine Besylate   . Clonidine     Sweating, nausea, felt faint  . Diltiazem Hcl   . Doxycycline Hyclate     REACTION:  tetracycline  . Iodine     Injection iodine--  . Labetalol Hcl   . Olmesartan Medoxomil   . Penicillins     Pt not allergic--just causes yeast infections    Current Medications, Allergies, Past Medical History, Past Surgical History, Family History, and Social History were reviewed in [employer] record.   Review of Systems        See HPI - all other systems neg except  as noted...  The patient complains of dyspnea on exertion.  The patient denies anorexia, fever, weight loss, weight gain, vision loss, decreased hearing, hoarseness, chest pain, syncope, peripheral edema, prolonged cough, headaches, hemoptysis, abdominal pain, melena, hematochezia, severe indigestion/heartburn, hematuria, incontinence, muscle weakness, suspicious skin lesions, transient blindness, difficulty walking, depression, unusual weight change, abnormal bleeding, enlarged lymph nodes, and angioedema.     Objective:   Physical Exam     WD, Obese, 79 y/o BF in NAD... GENERAL:  Alert & oriented; pleasant & cooperative... HEENT:  [place]/AT, EOM-wnl, PERRLA, EACs-clear, TMs-wnl, NOSE-clear, THROAT-clear & wnl. NECK:  Supple w/ fairROM; no JVD; normal carotid impulses w/o bruits; Thyroid surg scar, no thyromegaly or nodules palpated; no lymphadenopathy. CHEST:  Clear to P & A; without wheezes/ rales/ or rhonchi heard... HEART:  Regular Rhythm; without murmurs/ rubs/ or gallops detected... ABDOMEN:  Obese soft & nontender; normal bowel sounds; no organomegaly or masses palpated... EXT: without deformities, mild arthritic changes; no varicose veins/ venous insuffic/ tr edema. NEURO:  CN's intact; no focal neuro deficits... DERM:  No lesions noted; no rash etc...  RADIOLOGY DATA:  Reviewed in the EPIC EMR & discussed w/ the patient...    >>  CXR 1/13 showed borderline heart size, clear lungs, TSpine w/ mild spondylosis...  LABORATORY DATA:  Reviewed in the EPIC EMR & discussed w/ the patient...    >>LABS 1/13 revealed FLP-ok, Chems-ok, CBC-ok, TSH-ok, VitD-57...   Assessment & Plan:   OSA>  Followed by DrClance yearly; she wear CPAP compliantly, no mask issues except when "congested", no daytime hypersomnolence etc...  HBP>  Controlled on meds as listed, continue same...  Hx AtypicCP & mild cerebrovasc plaque>  On ASA w/o ischemic symptoms; CDopplers are neg...  CHOL>  Stable on diet +  Simva40, needs to lose weight!  Hypothyroid>  On Levoxyl 10mcg/d, continue same...  GI>  GERD, IBS, Colon Polyps>  Followed by DrGessner, stable on OTC Prilosec but doesn't want antispasmotic etc...  GU>  Atrophic right kidney, sm renal cysts, left angiomyolipoma, normal renal function> Followed by DrBorden every 51mo...  DJD/ GOUT>  Stable w/o acute gout attacks...  Anxiety>  She still declines anxiolytic Rx...   Patient's Medications  New Prescriptions   No medications on file  Previous Medications   ALLOPURINOL (ZYLOPRIM) 100 MG TABLET    Take 1 tablet (100 mg total) by mouth daily.   ASPIRIN 81 MG TABLET    Take 81 mg by mouth daily.     ATENOLOL (TENORMIN) 100 MG TABLET    TAKE 1 TABLET DAILY   CALCIUM CARBONATE-VITAMIN D (CALTRATE 600+D) 600-400 MG-UNIT PER TABLET    Take 1 tablet by mouth 2 (two) times daily.     DORZOLAMIDE-TIMOLOL (COSOPT) 22.3-6.8 MG/ML OPHTHALMIC SOLUTION    Place 1 drop into both eyes 2 (two) times daily.   GUAIFENESIN (MUCINEX MAXIMUM STRENGTH) 1200 MG TB12    Take 1 tablet by mouth as needed.    LATANOPROST (XALATAN) 0.005 % OPHTHALMIC SOLUTION    Place 1 drop into the right eye at bedtime.     LOTREL 5-10 MG PER CAPSULE    TAKE 1 CAPSULE DAILY   MELOXICAM (MOBIC) 7.5 MG TABLET    Take 1 tablet (7.5 mg total) by mouth daily as needed for pain. Take with food   MULTIPLE VITAMINS-MINERALS (CENTRUM SILVER PO)    Take 1 tablet by mouth daily.     SIMVASTATIN (ZOCOR) 40 MG TABLET    TAKE 1 TABLET DAILY   TRIAMTERENE-HYDROCHLOROTHIAZIDE (DYAZIDE) 37.5-25 MG PER CAPSULE    Take 1 each (1 capsule total) by mouth every morning.  Modified Medications   Modified Medication Previous Medication   LEVOTHYROXINE (LEVOXYL) 50 MCG TABLET LEVOXYL 50 MCG tablet      Take 1 tablet (50 mcg total) by mouth daily. Please dispense the generic levothyroxine.    TAKE 1 TABLET DAILY   POTASSIUM CHLORIDE (MICRO-K) 10 MEQ CR CAPSULE potassium chloride (MICRO-K) 10 MEQ CR capsule       Take 10 mEq by mouth 2 (two) times daily.    Take 2 tablets by mouth daily  Discontinued Medications   No medications on file

## 2011-09-18 NOTE — Patient Instructions (Addendum)
Today we updated your med list in our EPIC system...    Continue your current medications the same...  We wrote a new prescription for your Thyroid medication...  Let's get on track w/ our diet 7 exercise program...    The goal is to get the weight DOWN!!!  We will schedule a carotid doppler test to check for any blockage in your carotid arteries...  Call for any questions...  Let's continue our 6 month follow up visits.April KitchenMarland Donaldson

## 2011-09-22 ENCOUNTER — Encounter (INDEPENDENT_AMBULATORY_CARE_PROVIDER_SITE_OTHER): Payer: Medicare Other

## 2011-09-22 DIAGNOSIS — I6529 Occlusion and stenosis of unspecified carotid artery: Secondary | ICD-10-CM

## 2011-09-22 DIAGNOSIS — I679 Cerebrovascular disease, unspecified: Secondary | ICD-10-CM

## 2011-10-16 ENCOUNTER — Ambulatory Visit: Payer: Medicare Other | Admitting: Internal Medicine

## 2011-11-17 ENCOUNTER — Ambulatory Visit (INDEPENDENT_AMBULATORY_CARE_PROVIDER_SITE_OTHER): Payer: Medicare Other

## 2011-11-17 DIAGNOSIS — Z23 Encounter for immunization: Secondary | ICD-10-CM

## 2011-11-20 DIAGNOSIS — Z23 Encounter for immunization: Secondary | ICD-10-CM

## 2011-11-30 ENCOUNTER — Other Ambulatory Visit: Payer: Self-pay | Admitting: Pulmonary Disease

## 2011-12-07 ENCOUNTER — Encounter: Payer: Self-pay | Admitting: Internal Medicine

## 2011-12-07 ENCOUNTER — Telehealth: Payer: Self-pay | Admitting: Internal Medicine

## 2011-12-07 NOTE — Telephone Encounter (Signed)
Patient reports she was in the shower and had an episode where she was not able to get any air in.  She states she beat on her chest until she could breathe again.  She would like to be seen for this.  I have advised that that she needs to see her pulmonologist for this.  She does report that when she eats she coughs sometimes.  Per chart she has a history of aspiration. She is asked to keep the appt for 01/05/12 and we can evaluate her for dysphagia then and see pulmonology for the difficulty breathing.

## 2011-12-07 NOTE — Telephone Encounter (Signed)
Agree 

## 2011-12-08 ENCOUNTER — Encounter: Payer: Self-pay | Admitting: Pulmonary Disease

## 2011-12-08 ENCOUNTER — Ambulatory Visit (INDEPENDENT_AMBULATORY_CARE_PROVIDER_SITE_OTHER)
Admission: RE | Admit: 2011-12-08 | Discharge: 2011-12-08 | Disposition: A | Discharge status: Home / Self Care | Payer: Medicare Other | Source: Ambulatory Visit | Attending: Pulmonary Disease | Admitting: Pulmonary Disease

## 2011-12-08 ENCOUNTER — Other Ambulatory Visit (INDEPENDENT_AMBULATORY_CARE_PROVIDER_SITE_OTHER): Payer: Medicare Other

## 2011-12-08 ENCOUNTER — Ambulatory Visit (INDEPENDENT_AMBULATORY_CARE_PROVIDER_SITE_OTHER): Payer: Medicare Other | Admitting: Pulmonary Disease

## 2011-12-08 VITALS — BP 120/84 | HR 65 | Temp 97.9°F | Ht 59.0 in | Wt 179.2 lb

## 2011-12-08 DIAGNOSIS — R0609 Other forms of dyspnea: Secondary | ICD-10-CM

## 2011-12-08 DIAGNOSIS — K589 Irritable bowel syndrome without diarrhea: Secondary | ICD-10-CM

## 2011-12-08 DIAGNOSIS — R0600 Dyspnea, unspecified: Secondary | ICD-10-CM

## 2011-12-08 DIAGNOSIS — I10 Essential (primary) hypertension: Secondary | ICD-10-CM

## 2011-12-08 DIAGNOSIS — D3000 Benign neoplasm of unspecified kidney: Secondary | ICD-10-CM

## 2011-12-08 DIAGNOSIS — K219 Gastro-esophageal reflux disease without esophagitis: Secondary | ICD-10-CM

## 2011-12-08 DIAGNOSIS — F411 Generalized anxiety disorder: Secondary | ICD-10-CM

## 2011-12-08 DIAGNOSIS — I872 Venous insufficiency (chronic) (peripheral): Secondary | ICD-10-CM

## 2011-12-08 DIAGNOSIS — R0989 Other specified symptoms and signs involving the circulatory and respiratory systems: Secondary | ICD-10-CM

## 2011-12-08 DIAGNOSIS — E039 Hypothyroidism, unspecified: Secondary | ICD-10-CM

## 2011-12-08 DIAGNOSIS — E669 Obesity, unspecified: Secondary | ICD-10-CM

## 2011-12-08 DIAGNOSIS — E7800 Pure hypercholesterolemia, unspecified: Secondary | ICD-10-CM

## 2011-12-08 DIAGNOSIS — G4733 Obstructive sleep apnea (adult) (pediatric): Secondary | ICD-10-CM

## 2011-12-08 DIAGNOSIS — M1990 Unspecified osteoarthritis, unspecified site: Secondary | ICD-10-CM

## 2011-12-08 LAB — CBC WITH DIFFERENTIAL/PLATELET
Basophils Relative: 0.6 % (ref 0.0–3.0)
Eosinophils Relative: 2 % (ref 0.0–5.0)
HCT: 41.2 % (ref 36.0–46.0)
Hemoglobin: 13.3 g/dL (ref 12.0–15.0)
Lymphocytes Relative: 34.8 % (ref 12.0–46.0)
Lymphs Abs: 3.8 10*3/uL (ref 0.7–4.0)
Monocytes Relative: 9.5 % (ref 3.0–12.0)
Neutro Abs: 5.8 10*3/uL (ref 1.4–7.7)
RBC: 4.57 Mil/uL (ref 3.87–5.11)
RDW: 14.4 % (ref 11.5–14.6)

## 2011-12-08 LAB — BASIC METABOLIC PANEL
Calcium: 9.7 mg/dL (ref 8.4–10.5)
GFR: 65.2 mL/min (ref >60.00)
Glucose, Bld: 88 mg/dL (ref 70–99)
Potassium: 4.4 mEq/L (ref 3.5–5.1)
Sodium: 141 mEq/L (ref 135–145)

## 2011-12-08 MED ORDER — CLONAZEPAM 0.5 MG PO TABS: 0.5000 mg | ORAL_TABLET | Freq: Two times a day (BID) | ORAL | Status: DC | PRN

## 2011-12-08 NOTE — Patient Instructions (Addendum)
Today we updated your med list in our EPIC system...    Continue your current medications the same...  We decided to do some additional studies to thoroughly evaluate your shortness of breath episode>    We will check blood work, CXR, pulmonary function, and a CT scan of your chest...    We will call you w/ these results...  In the meanwhile, the most likely culprit is the cricopharyngeus muscle in your throat>    The best treatment is a combination relaxer for this muscle to prevent these episodes...     Start the new Klonopin 0.5mg  - one tab twice daily...  Keep your planned follow up visit w/ DrGessner...  You already have a follow up appt w/ me 03/21/12.Marland KitchenMarland Kitchen

## 2011-12-08 NOTE — Progress Notes (Signed)
Subjective:    Patient ID: April Donaldson, female    DOB: November 02, 1931, 78 y.o.   MRN: [id number]  HPI 78 y/o BF here for a 6 month follow up visit... she has multiple medical problems as noted below... 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  ~  March 08, 2010:  81mo ROV- generally stable, wonders if she can stop any meds, and concerned about spot behind ear= benign SK & advised Derm for excision... notes some mild tinnitus & offered ENT eval but she declines at this time> we discussed "white noise" masking... BP remains under good control;  Chol looks good on Simva40;  Thyroid stable on Levoxyl;  weight down 5# to 186#;  GI is stable;  she had f/u DrBorden 11/11 w/ another sonar= no change AML or cyst;  she tells me she's not taking the Allopurinol every day & no gout attacks;  she is seeing a chiropractor, DrGibson, for LBP- s/p "spinal decompression" & improved...  ~  September 16, 2010:  81mo ROV & she has been stable> notes one day of abd discomfort recently (sounded like IBS- she thinks related to something that she ate) & she is offered Bentyl vs CT Abd vs GI follow up (DrGessner) but she prefers to wait & see if symptoms recur)...  Stable on current meds which we again reviewed one-at-a-time since she wants to stop any unneeded medications...     [facility] records indicate she had an ENT eval by DrCrossley 5/12 w/ Ba swallow but we don't have his notes or the test results (done by speech path- can't find report)...    She saw DrBorden for her routine 81mo Urology check up (atrophic left kidney, bilat sm renal cysts,  & 0.7cm right renal angiomyolipoma); Ultrasound was essent unchanged & she was reassured...    She had f/u BMD 3/12 [facility] w/ TScore -0.7 in  FemNeck> normal...    She saw DrClance for SleepMed f/u 2/12> stable on her CPAP...  ~  March 20, 2011:  81mo ROV & she states that she "wants to be sure that she is ok"> she had "the flu" w/ dry cough, stuffy, congested, T99+, hurting in ribs, & treated herself w/ OTC meds; then developed epistaxis & went to see DrCrossley "he said it was infection" & Rx w/ ZPak & improved; for reassurance we decided to check CXR (borderline heart size, clear lungs, TSpine w/ mild spondylosis); and LABS (FLP-ok, Chems-ok, CBC-ok, TSH-ok, VitD-57)==> See below: BP remains well controlled; denies CP or cerebral ischemic symptoms; Chol looks good on Simva40; Thyroid ok on Levo50; Renal stable w/ normal Creat; etc... She is given ample reassurance...  ~  September 18, 2011:  81mo ROV & she reports "somewhat OK" w/ mult somatic complaints & questions today; she's been on Levoxyl & no longer avail but she's worried how the generic "will react w/ my body"- I reassured her;  "I'm getting speckled" c/o brown spots (nevi), red spots (cherry hemangiomas), & encouraged to get "body scan" w/ Derm;  She had [facility] screen- all neg/ normal x CDoppler w/ mod left carotid dis ident> we discussed the need for formal CDopplers, note- no bruits heard...  Note- she had last Tetanus 2002 & due for TDAP today...    We reviewed prob list, meds,  xrays and labs> see below for updates>> Carotid Dopplers ==> 7/13 showed mild heterogenous plaque- not hemodynam signif- velocities wnl, 0-39% bilat ICA stenoses; plan repeat 41yrs...  ~  December 08, 2011:  42mo ROV & add on at pt request for an episode of SOB> occurred in the shower 3d ago, ?felt choked, couldn't get a breath, got out of shower, beat herself on the chest & "my breathing started back up", felt like it was a plug, but she is adamant that she did NOT cough up any mucous or a plug of anything; she admits that she was very anxious about this, main sensation was not being able to get the air "in";  she does admit to occas prob getting strangled on saliva (no prob w/ solid foods, just w/ saliva) & she had prev eval by DrCrossley w/ MBS- we can't find these results in Epic...     We reviewed prev labs & XRay results in Epic;  We discussed how cricopharyngeus muscle dysfunction vs VCD can cause these episodes & a lump-like feeling in throat;  She wants further eval & in this regard we discussed checking:   LABS showed:  Chems- wnl & similar to labs 8/13 from [facility];  CBC- ok x wbc=11.0;  TSH=4.37;  Sed=32 CXR showed heart at upper lim of normal, calcif in Ao arch, sl eventration of rt hemidiaph, mild basilar atx, NAD, no change from old films... PFT> poor tracings, best of the 3 w/ normal airflow, no obstruction; FVC=1.86 (104%), FEV1=1.55 (115%), %1sec=83, mid-flows=154% pred... CTChest did not show any findings to explain her symptoms; incidental findings included coronary calcif, clear lungs, ?cyst in liver & right kidney... Med Rx trial w/ Klonopin 0.5mg  Bid is recommended & lots of reassurance given...  ADDENDUM >> pt reports that Klonopin seems to be working very well; no episodes since starting Rx; med seems a bit sedating for her & rec to decr to 1/2 tab bid...           Problem List:  GLAUCOMA (ICD-365.9) - followed by DrShapiro on gtts... 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  OBSTRUCTIVE SLEEP APNEA (ICD-780.57) - Hx borderline sleep study in past w/ RDI=12, desat to 87%... increased symptoms of daytime hypersomn resulted in repeat sleep study 8/09 w/ RDI= 20, mild snoring, & desat to 66%- so she was started on CPAP by DrClance, adjusted to 10cm pressure and she reports doing well after mask adjustments... ~  2/12:  yearly f/u DrClance- doing well, no changes made. ~  3/13:  She saw DrClance in follow up> using CPAP compliantly, no mask issues, daytime alertness is iproved, asked to work on wt reduction.  HYPERTENSION (ICD-401.9) - controlled on ATENOLOL 100mg /d, LOTREL  5-10 daily, & DYAZIDE 1/d, & KCl - 2daily...  BP= 120/84 today (similar at home)- she takes meds regularly & tolerates well... she tries to follow a low sodium diet and she has tried to count calories and lose wt... denies HA, fatigue, visual changes, CP, palipit, dizziness, syncope, dyspnea, etc...  ~  2DEcho 4/08 was normal w/ EF=55-65%...  ~  NuclearStressTest 4/08 was negative- no ischemia & EF=79% (similiar to 2002)... ~  Dobutamine Echo 4/10 was neg as well... ~  CXR 1/11 showed stable cardiomeg, ectasia of thorAo, incr markings bases, NAD.Marland Kitchen. ~  CXR 1/13 showed borderline heart size, clear lungs, TSpine w/ mild spondylosis... ~  CXR 10/13 showed heart at upper lim of normal, calcif in Ao arch, sl eventration of rt hemidiaph, mild  basilar atx, NAD, no change from old films... ~  CTChest 10/13 showed normal heart size, no effusions, clear lungs, +coronary calcif, cysts on liver & right kidney...  CHEST PAIN, ATYPICAL (ICD-786.59) - she went to the ER 3/10 w/ atyp CP & ruled out for MI- seen by Walker Kehr w/ neg DobutEcho 4/10 (no change in meds)...  CEREBROVASCULAR DISEASE (ICD-437.9) - on ASA 81mg /d... prev CDopplers 6/04 showed mild plaque, 0-39% bilat... ~  CDopplers 7/13 showed mild heterogenous plaque- not hemodynam signif- velocities wnl, 0-39% bilat ICA stenoses; plan repeat 74yrs...  VENOUS INSUFFICIENCY (ICD-459.81) - on low sodium diet, +elevation, +support hose, & Dyazide...  HYPERCHOLESTEROLEMIA (ICD-272.0) - on SIMVASTATIN 40mg /d + diet efforts... ~  FLP 7/08 showed TChol 156, TG 118, HDL 55, LDL 78 ~  FLP 7/09 showed TChol 159, TG 142, HDL 48, LDL 83... rec- continue same. ~  FLP 7/10 showed TChol 151, TG 81, HDL 59, LDL 76... continue Simva40. ~  FLP 1/11 showed TChol 155, TG 148, HDL 48, LDL 77 ~  FLP 7/11 showed TChol 147, TG 103, HDL 56, LDL 71... continue Simva40. ~  FLP 1/12 on Simva40 showed TChol 162, TG 110, HDL 60, LDL 80 ~  FLP 1/13 on Simva40 showed TChol 156,  TG 92, HDL 60, LDL 78  HYPOTHYROIDISM (ICD-244.9) - on LEVOXYL 61mcg/d> s/p R thyroid lobectomy 2002 by DrCrossley for cyst...  ~  labs 1/09 on Levoxyl50 showed TSH= 3.10 ~  labs 7/09 on Levoxyl50 showed TSH= 2.34 ~  labs 1/10 showed TSH= 3.78... continue LEVOXYL50 ~  labs 7/10 showed TSH= 2.54 ~  labs 1/11 showed TSH= 2.95 ~  labs 7/11 showed TSH= 2.64 ~  labs 1/12 on Levo50 showed TSH= 3.59 ~  Labs 1/13 on Levo50 showed TSH= 4.95 ~  Labs 10/13 showed TSH= 4.37  OBESITY (ICD-278.00) -  she knows that she needs to diet, exercise and get her weight down! ~  weight 1/10 = 193#,  she is 4\' 10"  tall,  BMI= 40... ~  weight 7/10 = 194# ~  weight 1/11 = 188# ~  weight 7/11 = 191# ~  weight 1/12 = 186# ~  Weight 7/12 = 187# ~  Weight 1/13 = 186# ~  Weight 7/13 = 189# ~  Weight 10/13 = 179#... Good job w/ wt reduction!  GERD (ICD-530.81) - she uses OTC Prilosec 20mg  Prn... she saw DrGessner in Feb09- doing satis... ~  [facility] records indicate she had an ENT eval by DrCrossley 5/12 w/ Ba swallow but we don't have his notes or the test results...  IRRITABLE BOWEL SYNDROME (ICD-564.1) &  COLONIC POLYPS (ICD-211.3) - colonoscopy 3/06 by DrSamLeBauer showed extremely tortuous colon, divertics, sm polyp... f/u 43yrs... ~  colonoscopy 3/11 by DrGessner showed divertics, stenosis in sigmoid, hems, & 2 sm polyps= adenoma fragments, f/u 57yrs.  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  ~  labs 1/09 showed BUN= 15, Creat= 1.0 ~  labs 1/10 showed BUN= 18, Creat= 1.0 ~  labs 1/11 showed BUN= 16, Creat= 1.0 ~  labs 1/12 showed BUN= 19, Creat= 0.9  ~  8/12: she had f/u DrWebb> his note is reviewed, stable, no changes made. ~   Labs 1/13 showed BUN= 17, Creat= 1.0 ~  8/13:  she continues to follow up w/ Nephrology, DrWebb; BP controlled on meds, renal function normal, no changes made. ~  Labs 10/13 showed BUN= 15, Creat= 1.1  BENIGN NEOPLASM OF KIDNEY (ICD-223.0) - followed by DrBorden for ANGIOMYOLIPOMA (7mm right renal lesion- AML confirmed by MRI & followed by ultrasound exams) & renal cysts... she is seen every 74mo for sonars due to her severe anxiety about these benign lesions... ~  3/11: f/u DrBorden for 74mo sonar to eval angiomyolipoma right kidney- no change, reassured...  ~  11/11: f/u DrBorden w/ another sonar= no change AML or cyst;  ~  5/12:  routine 74mo f/u DrBorden w/ sonar> no change in 7mm AML right kidney, bilat renal cysts, sm right kidney... ~  11/12: f/u DrBorden for atrophic left kidney & 0.7cm AML right kidney; Sonar unchanged & she persists in wanting scans every 74mo. ~  5/13:  F/u DrBorden w/ atrophic left kidney & 0.7cm angiomyolipoma right kid; she requests sonar Q73m due to her extreme anxiety & need for constant reassurance- no ch on sonar.  DEGENERATIVE JOINT DISEASE (ICD-715.90) - she sees DrHilts for Ortho... LOW BACK PAIN, CHRONIC (ICD-724.2) - she tells me she is seeing a chiropractor, DrGibson, for "spinal decompression" & improved ~  She had f/u BMD 3/12 at [facility] w/ TScore -0.7 in FemNeck> normal...  GOUT (ICD-274.9) - Uric Acid Jul09 was 8.1 and ALLOPURINOL 100mg /d started by DrWebb... ~  labs 7/10 showed Uric= 5.8 ~  labs 1/11 showed Uric= 5.7 ~  labs 1/12 showed Uric= 6.1  ANXIETY (ICD-300.00) - she has severe anxiety about her health status, doesn't want nerve pills. ~  10/13:  Episode of choking, couldn't get air "in", & running down the hall pounding her chest after a shower; Eval was neg- and rec to take KLONOPIN 0.5mg  1/2-1 Bid.  Health Maintenance - her GYN is ?(NP Maurice March)... BMD at [facility] done 2/ 10 & improved, on calcium, vits, etc... ~  labs 7/09 showed Vit D level =  57 ~  labs 7/10 showed Vit D level = 46 ~  labs 7/11 showed Vit D level = 68 ~  Labs 1/13 showed Vit D level = 57   Past Surgical History  Procedure Date  . Hysterctomy and ooperectomy 1970's  . Thyroid lobectomy for a cyst 10/2000    Dr. Haroldine Laws  . Functional endoscopic sinus surgery 05/2003    Dr. Haroldine Laws    Outpatient Encounter Prescriptions as of 12/08/2011  Medication Sig Dispense Refill  . allopurinol (ZYLOPRIM) 100 MG tablet Take 1 tablet (100 mg total) by mouth daily.  30 tablet  11  . aspirin 81 MG tablet Take 81 mg by mouth daily.        Marland Kitchen atenolol (TENORMIN) 100 MG tablet TAKE 1 TABLET DAILY  90 tablet  1  . Calcium Carbonate-Vitamin D (CALTRATE 600+D) 600-400 MG-UNIT per tablet Take 1 tablet by mouth 2 (two) times daily.        . dorzolamide-timolol (COSOPT) 22.3-6.8 MG/ML ophthalmic solution Place 1 drop into both eyes 2 (two) times daily.      . Guaifenesin (MUCINEX MAXIMUM STRENGTH) 1200 MG TB12 Take 1 tablet by mouth as needed.       . latanoprost (XALATAN) 0.005 % ophthalmic solution Place 1 drop into the right eye at bedtime.        Marland Kitchen levothyroxine (LEVOXYL) 50 MCG tablet Take 1 tablet (50 mcg total) by mouth daily. Please dispense the generic levothyroxine.  30 tablet  5  . LOTREL 5-10 MG per capsule TAKE 1 CAPSULE DAILY  90 capsule  1  . Multiple Vitamins-Minerals (CENTRUM SILVER PO) Take 1 tablet by mouth daily.        . potassium chloride (MICRO-K) 10 MEQ CR capsule Take 10 mEq by mouth 2 (two) times daily.      . simvastatin (ZOCOR) 40 MG tablet TAKE 1 TABLET DAILY  90 tablet  1  . triamterene-hydrochlorothiazide (DYAZIDE) 37.5-25 MG per capsule Take 1 each (1 capsule total) by mouth every morning.  30 capsule  11  . meloxicam (MOBIC) 7.5 MG tablet Take 1 tablet (7.5 mg total) by mouth daily as needed for pain. Take with food  30 tablet  0    Allergies  Allergen Reactions  . Amlodipine Besylate   . Clonidine     Sweating, nausea, felt faint  . Diltiazem  Hcl   . Doxycycline Hyclate     REACTION:  tetracycline  . Iodine     Injection iodine--  . Labetalol Hcl   . Olmesartan Medoxomil   . Penicillins     Pt not allergic--just causes yeast infections    Current Medications, Allergies, Past Medical History, Past Surgical History, Family History, and Social History were reviewed in [employer] record.   Review of Systems        See HPI - all other systems neg except as noted...  The patient complains of dyspnea on exertion.  The patient denies anorexia, fever, weight loss, weight gain, vision loss, decreased hearing, hoarseness, chest pain, syncope, peripheral edema, prolonged cough, headaches, hemoptysis, abdominal pain, melena, hematochezia, severe indigestion/heartburn, hematuria, incontinence, muscle weakness, suspicious skin lesions, transient blindness, difficulty walking, depression, unusual weight change, abnormal bleeding, enlarged lymph nodes, and angioedema.     Objective:   Physical Exam     WD, Obese, 79 y/o BF in NAD... GENERAL:  Alert & oriented; pleasant & cooperative... HEENT:  [place]/AT, EOM-wnl, PERRLA, EACs-clear, TMs-wnl, NOSE-clear, THROAT-clear & wnl. NECK:  Supple w/ fairROM; no JVD; normal carotid impulses w/o bruits; Thyroid surg scar, no thyromegaly or nodules palpated; no lymphadenopathy. CHEST:  Clear to P & A; without wheezes/ rales/ or rhonchi heard... HEART:  Regular Rhythm; without murmurs/ rubs/ or gallops detected... ABDOMEN:  Obese soft & nontender; normal bowel sounds; no organomegaly or masses palpated... EXT: without deformities, mild arthritic changes; no varicose veins/ venous insuffic/ tr edema. NEURO:  CN's intact; no focal neuro deficits... DERM:  No lesions noted; no rash etc...  RADIOLOGY DATA:  Reviewed in the EPIC EMR & discussed w/ the patient...    >>CXR 1/13 showed borderline heart size, clear lungs, TSpine w/ mild spondylosis...  LABORATORY DATA:  Reviewed in the  EPIC EMR & discussed w/ the patient...    >>LABS 1/13 revealed FLP-ok, Chems-ok, CBC-ok, TSH-ok, VitD-57...   Assessment & Plan:   ?Cricopharyngeus dysfunction, choking, dyspnea, anxiety>  Eval is neg as above & rec to try KLONOPIN 0.5mg - 1/2-1 Bid...  OSA>  Followed by DrClance yearly; she wear CPAP compliantly, no mask issues except when "congested", no daytime hypersomnolence etc...  HBP>  Controlled on meds as listed, continue same...  Hx AtypicCP & mild cerebrovasc plaque>  On ASA w/o ischemic symptoms; CDopplers are neg...  CHOL>  Stable on diet + Simva40, needs to lose weight!  Hypothyroid>  On Levoxyl 25mcg/d, continue same...  GI>  GERD, IBS, Colon Polyps>  Followed by DrGessner, stable on OTC Prilosec but doesn't want antispasmotic etc..Marland Kitchen  GU>  Atrophic right kidney, sm renal cysts, left angiomyolipoma, normal renal function> Followed by DrBorden every 34mo...  DJD/ GOUT>  Stable w/o acute gout attacks...  Anxiety>  She still declines anxiolytic Rx, but will try the [facility] as above...   Patient's Medications  New Prescriptions   CLONAZEPAM (KLONOPIN) 0.5 MG TABLET    Take 1 tablet (0.5 mg total) by mouth 2 (two) times daily as needed for anxiety.  Previous Medications   ALLOPURINOL (ZYLOPRIM) 100 MG TABLET    Take 1 tablet (100 mg total) by mouth daily.   ASPIRIN 81 MG TABLET    Take 81 mg by mouth daily.     ATENOLOL (TENORMIN) 100 MG TABLET    TAKE 1 TABLET DAILY   CALCIUM CARBONATE-VITAMIN D (CALTRATE 600+D) 600-400 MG-UNIT PER TABLET    Take 1 tablet by mouth 2 (two) times daily.     DORZOLAMIDE-TIMOLOL (COSOPT) 22.3-6.8 MG/ML OPHTHALMIC SOLUTION    Place 1 drop into both eyes 2 (two) times daily.   GUAIFENESIN (MUCINEX MAXIMUM STRENGTH) 1200 MG TB12    Take 1 tablet by mouth as needed.    LATANOPROST (XALATAN) 0.005 % OPHTHALMIC SOLUTION    Place 1 drop into the right eye at bedtime.     LEVOTHYROXINE (LEVOXYL) 50 MCG TABLET    Take 1 tablet (50 mcg total) by  mouth daily. Please dispense the generic levothyroxine.   LOTREL 5-10 MG PER CAPSULE    TAKE 1 CAPSULE DAILY   MELOXICAM (MOBIC) 7.5 MG TABLET    Take 1 tablet (7.5 mg total) by mouth daily as needed for pain. Take with food   MULTIPLE VITAMINS-MINERALS (CENTRUM SILVER PO)    Take 1 tablet by mouth daily.     POTASSIUM CHLORIDE (MICRO-K) 10 MEQ CR CAPSULE    Take 10 mEq by mouth 2 (two) times daily.   SIMVASTATIN (ZOCOR) 40 MG TABLET    TAKE 1 TABLET DAILY   TRIAMTERENE-HYDROCHLOROTHIAZIDE (DYAZIDE) 37.5-25 MG PER CAPSULE    Take 1 each (1 capsule total) by mouth every morning.  Modified Medications   No medications on file  Discontinued Medications   No medications on file

## 2011-12-13 ENCOUNTER — Ambulatory Visit (INDEPENDENT_AMBULATORY_CARE_PROVIDER_SITE_OTHER)
Admission: RE | Admit: 2011-12-13 | Discharge: 2011-12-13 | Disposition: A | Discharge status: Home / Self Care | Payer: Medicare Other | Source: Ambulatory Visit | Attending: Pulmonary Disease | Admitting: Pulmonary Disease

## 2011-12-13 DIAGNOSIS — R0609 Other forms of dyspnea: Secondary | ICD-10-CM

## 2011-12-13 DIAGNOSIS — R0600 Dyspnea, unspecified: Secondary | ICD-10-CM

## 2011-12-14 ENCOUNTER — Telehealth: Payer: Self-pay | Admitting: Pulmonary Disease

## 2011-12-14 NOTE — Telephone Encounter (Signed)
Patient returning SN phone call in regards to CT scan.  Please advise. Thanks.

## 2011-12-14 NOTE — Telephone Encounter (Signed)
SN will call this pt back.

## 2012-01-02 ENCOUNTER — Other Ambulatory Visit: Payer: Self-pay | Admitting: Family Medicine

## 2012-01-02 DIAGNOSIS — M545 Low back pain: Secondary | ICD-10-CM

## 2012-01-05 ENCOUNTER — Ambulatory Visit: Payer: Medicare Other | Admitting: Internal Medicine

## 2012-01-11 ENCOUNTER — Ambulatory Visit
Admission: RE | Admit: 2012-01-11 | Discharge: 2012-01-11 | Disposition: A | Discharge status: Home / Self Care | Payer: Medicare Other | Source: Ambulatory Visit | Attending: Family Medicine | Admitting: Family Medicine

## 2012-01-11 DIAGNOSIS — M545 Low back pain: Secondary | ICD-10-CM

## 2012-01-16 ENCOUNTER — Telehealth: Payer: Self-pay | Admitting: Pulmonary Disease

## 2012-01-16 NOTE — Telephone Encounter (Signed)
Per SN---stop the clonopin----the clonopin treats the nervousness.  If causing dreams the only option is to stop it.  Can schedule a follow up appt with SN.  thanks

## 2012-01-16 NOTE — Telephone Encounter (Signed)
I spoke with pt and she stated since being on the clonazepam 0.5 mg she has been having crazy dreams, feels nervous, her attnetion span is not good and she doesn't feel like herself. This has been going on for about a month since she started this medication. Pt is requesting further recs. Please advise SN thanks  Last OV 12/08/11 Pending OV 03/21/12 Allergies  Allergen Reactions  . Amlodipine Besylate   . Clonidine     Sweating, nausea, felt faint  . Diltiazem Hcl   . Doxycycline Hyclate     REACTION:  tetracycline  . Iodine     IVP contrast  . Labetalol Hcl   . Olmesartan Medoxomil   . Penicillins     Pt not allergic--just causes yeast infections

## 2012-01-16 NOTE — Telephone Encounter (Signed)
Spoke with patient in regards to message for SN. Patient currently taking Klonopin .25mg  (.5mg  tablet broken in half) BID...ok to stop per SN. Patient concerned with tapering the medication down or stopping cold [place]. Patient instructed to take .25mg  qd x 1 week then .25mg  every other day until Sun/Mon and stop. Patient felt better about this than stopping out right. Patient to call if any problems or difficulties with symptoms returning.

## 2012-01-16 NOTE — Telephone Encounter (Signed)
lmomtcb x1 

## 2012-01-18 ENCOUNTER — Other Ambulatory Visit: Payer: Self-pay | Admitting: Pulmonary Disease

## 2012-01-19 ENCOUNTER — Telehealth: Payer: Self-pay | Admitting: Pulmonary Disease

## 2012-01-19 NOTE — Telephone Encounter (Signed)
Called and spoke with pt and she is aware of SN recs to rest at home,  And use throat lozenges, mucinex 1-2 bid with plenty of fluids.  Pt is slowly tapering down the klonopin.  Pt voiced her understanding of these recs and nothing further is needed.

## 2012-01-19 NOTE — Telephone Encounter (Signed)
Per 11.19.13 phone note, pt instructed to taper her klonopin 0.25mg  as directed:  Nita Sells, CMA 01/16/2012 2:28 PM Signed  Spoke with patient in regards to message for SN. Patient currently taking Klonopin .25mg  (.5mg  tablet broken in half) BID...ok to stop per SN. Patient concerned with tapering the medication down or stopping cold [place]. Patient instructed to take .25mg  qd x 1 week then .25mg  every other day until Sun/Mon and stop. Patient felt better about this than stopping out right. Patient to call if any problems or difficulties with symptoms returning.  Carver Fila, [place] 01/16/2012 10:33 AM Signed  [email or web address] x1 Randell Loop, [facility] 01/16/2012 10:31 AM Signed  Per SN---stop the clonopin----the clonopin treats the nervousness. If causing dreams the only option is to stop it. Can schedule a follow up appt with SN. thanks  Called spoke with patient who began taking the klonopin 0.25mg  qd 11.20 and 11.21.13.  She takes this in the morning after breakfast.  However, this morning she woke up at about 3am with a HA and loose stools and reports she is not feeling well.  Also c/o hoarseness, head congestion with some PND.  Denies sore throat, wheezing, increased SOB, tightness in chest, f/c/s.  Pt is concerned that this is related to the decrease in the klonopin but does not want to increase the dose again.  Dr Kriste Basque please advise, thanks. Next ov w/ SN 1.24.14. [facility] Allergies  Allergen Reactions  . Amlodipine Besylate   . Clonidine     Sweating, nausea, felt faint  . Diltiazem Hcl   . Doxycycline Hyclate     REACTION:  tetracycline  . Iodine     IVP contrast  . Labetalol Hcl   . Olmesartan Medoxomil   . Penicillins     Pt not allergic--just causes yeast infections

## 2012-01-22 ENCOUNTER — Ambulatory Visit (INDEPENDENT_AMBULATORY_CARE_PROVIDER_SITE_OTHER): Payer: Medicare Other | Admitting: Adult Health

## 2012-01-22 ENCOUNTER — Encounter: Payer: Self-pay | Admitting: Adult Health

## 2012-01-22 VITALS — BP 118/70 | HR 57 | Temp 99.0°F | Ht 59.0 in | Wt 192.4 lb

## 2012-01-22 DIAGNOSIS — K529 Noninfective gastroenteritis and colitis, unspecified: Secondary | ICD-10-CM

## 2012-01-22 DIAGNOSIS — K5289 Other specified noninfective gastroenteritis and colitis: Secondary | ICD-10-CM

## 2012-01-22 MED ORDER — HYOSCYAMINE SULFATE 0.125 MG SL SUBL: 0.1250 mg | SUBLINGUAL_TABLET | SUBLINGUAL | Status: DC | PRN

## 2012-01-22 NOTE — Assessment & Plan Note (Signed)
Suspect an acute Gastroenteritis  Exam is unrevealing , suspect this will be self limiting  Advised if worsens or does not resolve will need further evaluation  Labs reviewed from 11/2011 were essentially unremarkable - no labs today.   Plan Advance bland diet as tolerated .  Gas X with meals .  Levsin As needed  For abdominal cramping.  Push fluids  Please contact office for sooner follow up if symptoms do not improve or worsen or seek emergency care

## 2012-01-22 NOTE — Progress Notes (Signed)
Subjective:    Patient ID: April Donaldson, female    DOB: August 03, 1931, 78 y.o.   MRN: [id number]  HPI 78 y/o BF here hx of  multiple medical problems as noted below... 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  ~  March 08, 2010:  123mo ROV- generally stable, wonders if she can stop any meds, and concerned about spot behind ear= benign SK & advised Derm for excision... notes some mild tinnitus & offered ENT eval but she declines at this time> we discussed "white noise" masking... BP remains under good control;  Chol looks good on Simva40;  Thyroid stable on Levoxyl;  weight down 5# to 186#;  GI is stable;  she had f/u DrBorden 11/11 w/ another sonar= no change AML or cyst;  she tells me she's not taking the Allopurinol every day & no gout attacks;  she is seeing a chiropractor, DrGibson, for LBP- s/p "spinal decompression" & improved...  ~  September 16, 2010:  123mo ROV & she has been stable> notes one day of abd discomfort recently (sounded like IBS- she thinks related to something that she ate) & she is offered Bentyl vs CT Abd vs GI follow up (DrGessner) but she prefers to wait & see if symptoms recur)...  Stable on current meds which we again reviewed one-at-a-time since she wants to stop any unneeded medications...     [facility] records indicate she had an ENT eval by DrCrossley 5/12 w/ Ba swallow but we don't have his notes or the test results (done by speech path- can't find report)...    She saw DrBorden for her routine 123mo Urology check up (atrophic left kidney, bilat sm renal cysts,  & 0.7cm right renal angiomyolipoma); Ultrasound was essent unchanged & she was reassured...    She had f/u BMD 3/12 at [facility] w/ TScore -0.7 in FemNeck> normal...    She saw DrClance  for SleepMed f/u 2/12> stable on her CPAP...  ~  March 20, 2011:  123mo ROV & she states that she "wants to be sure that she is ok"> she had "the flu" w/ dry cough, stuffy, congested, T99+, hurting in ribs, & treated herself w/ OTC meds; then developed epistaxis & went to see DrCrossley "he said it was infection" & Rx w/ ZPak & improved; for reassurance we decided to check CXR (borderline heart size, clear lungs, TSpine w/ mild spondylosis); and LABS (FLP-ok, Chems-ok, CBC-ok, TSH-ok, VitD-57)==> See below: BP remains well controlled; denies CP or cerebral ischemic symptoms; Chol looks good on Simva40; Thyroid ok on Levo50; Renal stable w/ normal Creat; etc... She is given ample reassurance...  ~  September 18, 2011:  123mo ROV & she reports "somewhat OK" w/ mult somatic complaints & questions today; she's been on Levoxyl & no longer avail but she's worried how the generic "will react w/ my body"- I reassured her;  "I'm getting speckled" c/o brown spots (nevi), red spots (cherry hemangiomas), & encouraged to get "body scan" w/ Derm;  She had [facility] screen- all neg/ normal x CDoppler w/ mod left carotid dis ident> we discussed the need for formal CDopplers, note- no bruits heard...  Note- she had last Tetanus 2002 & due for TDAP today...    We reviewed prob list, meds, xrays and labs> see below for  updates>> Carotid Dopplers ==> 7/13 showed mild heterogenous plaque- not hemodynam signif- velocities wnl, 0-39% bilat ICA stenoses; plan repeat 50yrs...  ~  December 08, 2011:  50mo ROV & add on at pt request for an episode of SOB> occurred in the shower 3d ago, ?felt choked, couldn't get a breath, got out of shower, beat herself on the chest & "my breathing started back up", felt like it was a plug, but she is adamant that she did NOT cough up any mucous or a plug of anything; she admits that she was very anxious about this, main sensation was not being able to get the air "in"; she does admit to occas prob getting  strangled on saliva (no prob w/ solid foods, just w/ saliva) & she had prev eval by DrCrossley w/ MBS- we can't find these results in Epic...     We reviewed prev labs & XRay results in Epic;  We discussed how cricopharyngeus muscle dysfunction vs VCD can cause these episodes & a lump-like feeling in throat;  She wants further eval & in this regard we discussed checking:   LABS showed:  Chems- wnl & similar to labs 8/13 from [facility];  CBC- ok x wbc=11.0;  TSH=4.37;  Sed=32 CXR showed heart at upper lim of normal, calcif in Ao arch, sl eventration of rt hemidiaph, mild basilar atx, NAD, no change from old films... PFT> poor tracings, best of the 3 w/ normal airflow, no obstruction; FVC=1.86 (104%), FEV1=1.55 (115%), %1sec=83, mid-flows=154% pred... CTChest did not show any findings to explain her symptoms; incidental findings included coronary calcif, clear lungs, ?cyst in liver & right kidney... Med Rx trial w/ Klonopin 0.5mg  Bid is recommended & lots of reassurance given...  ADDENDUM >> pt reports that Klonopin seems to be working very well; no episodes since starting Rx; med seems a bit sedating for her & rec to decr to 1/2 tab bid...  01/22/2012 Acute OV  Presents for an acute office visit . Complains of lower abd pain x1 day w/ loose stools x3 and bloating/excess gas.  denies urinary symptoms. Worse yesterday, seems better today but not gone.  Ate broccoli, sausage and spinach prior to symptoms .  No bloody stools, no fever , no recent abx.  No recent travel.  Has not taken anything for these symptoms.  Good appetite- no n/v.  No stools , diarrhea today .  No otc used           Problem List:  GLAUCOMA (ICD-365.9) - followed by DrShapiro on gtts... 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  OBSTRUCTIVE SLEEP APNEA (ICD-780.57) - Hx borderline sleep study in past w/ RDI=12, desat to 87%... increased symptoms of daytime hypersomn resulted in repeat sleep study 8/09 w/ RDI= 20, mild  snoring, & desat to 66%- so she was started on CPAP by DrClance, adjusted to 10cm pressure and she reports doing well after mask adjustments... ~  2/12:  yearly f/u DrClance- doing well, no changes made. ~  3/13:  She saw DrClance in follow up> using CPAP compliantly, no mask issues, daytime alertness is iproved, asked to work on wt reduction.  HYPERTENSION (ICD-401.9) - controlled on ATENOLOL 100mg /d, LOTREL 5-10 daily, & DYAZIDE 1/d, & KCl - 2daily...  BP= 120/84 today (similar at home)- she takes meds regularly & tolerates well... she tries to follow a low sodium diet and she has tried to count calories and lose wt... denies HA, fatigue, visual changes, CP, palipit, dizziness, syncope, dyspnea, etc...  ~  2DEcho 4/08 was normal w/ EF=55-65%...  ~  NuclearStressTest 4/08 was negative- no ischemia & EF=79% (similiar to 2002)... ~  Dobutamine Echo 4/10 was neg as well... ~  CXR 1/11 showed stable cardiomeg, ectasia of thorAo, incr markings bases, NAD.Marland Kitchen. ~  CXR 1/13 showed borderline heart size, clear lungs, TSpine w/ mild spondylosis... ~  CXR 10/13 showed heart at upper lim of normal, calcif in Ao arch, sl eventration of rt hemidiaph, mild basilar atx, NAD, no change from old films... ~  CTChest 10/13 showed normal heart size, no effusions, clear lungs, +coronary calcif, cysts on liver & right kidney...  CHEST PAIN, ATYPICAL (ICD-786.59) - she went to the ER 3/10 w/ atyp CP & ruled out for MI- seen by Walker Kehr w/ neg DobutEcho 4/10 (no change in meds)...  CEREBROVASCULAR DISEASE (ICD-437.9) - on ASA 81mg /d... prev CDopplers 6/04 showed mild plaque, 0-39% bilat... ~  CDopplers 7/13 showed mild heterogenous plaque- not hemodynam signif- velocities wnl, 0-39% bilat ICA stenoses; plan repeat 63yrs...  VENOUS INSUFFICIENCY (ICD-459.81) - on low sodium diet, +elevation, +support hose, & Dyazide...  HYPERCHOLESTEROLEMIA (ICD-272.0) - on SIMVASTATIN 40mg /d + diet efforts... ~  FLP 7/08 showed TChol  156, TG 118, HDL 55, LDL 78 ~  FLP 7/09 showed TChol 159, TG 142, HDL 48, LDL 83... rec- continue same. ~  FLP 7/10 showed TChol 151, TG 81, HDL 59, LDL 76... continue Simva40. ~  FLP 1/11 showed TChol 155, TG 148, HDL 48, LDL 77 ~  FLP 7/11 showed TChol 147, TG 103, HDL 56, LDL 71... continue Simva40. ~  FLP 1/12 on Simva40 showed TChol 162, TG 110, HDL 60, LDL 80 ~  FLP 1/13 on Simva40 showed TChol 156, TG 92, HDL 60, LDL 78  HYPOTHYROIDISM (ICD-244.9) - on LEVOXYL 73mcg/d> s/p R thyroid lobectomy 2002 by DrCrossley for cyst...  ~  labs 1/09 on Levoxyl50 showed TSH= 3.10 ~  labs 7/09 on Levoxyl50 showed TSH= 2.34 ~  labs 1/10 showed TSH= 3.78... continue LEVOXYL50 ~  labs 7/10 showed TSH= 2.54 ~  labs 1/11 showed TSH= 2.95 ~  labs 7/11 showed TSH= 2.64 ~  labs 1/12 on Levo50 showed TSH= 3.59 ~  Labs 1/13 on Levo50 showed TSH= 4.95 ~  Labs 10/13 showed TSH= 4.37  OBESITY (ICD-278.00) -  she knows that she needs to diet, exercise and get her weight down! ~  weight 1/10 = 193#,  she is 4\' 10"  tall,  BMI= 40... ~  weight 7/10 = 194# ~  weight 1/11 = 188# ~  weight 7/11 = 191# ~  weight 1/12 = 186# ~  Weight 7/12 = 187# ~  Weight 1/13 = 186# ~  Weight 7/13 = 189# ~  Weight 10/13 = 179#... Good job w/ wt reduction!  GERD (ICD-530.81) - she uses OTC Prilosec 20mg  Prn... she saw DrGessner in Feb09- doing satis... ~  [facility] records indicate she had an ENT eval by DrCrossley 5/12 w/ Ba swallow but we don't have his notes or the test results...  IRRITABLE BOWEL SYNDROME (ICD-564.1) &  COLONIC POLYPS (ICD-211.3) - colonoscopy 3/06 by DrSamLeBauer showed extremely tortuous colon, divertics, sm polyp... f/u 69yrs... ~  colonoscopy 3/11 by DrGessner showed divertics, stenosis in sigmoid, hems, & 2 sm polyps= adenoma fragments, f/u 70yrs.  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  ~  labs 1/09 showed BUN= 15, Creat= 1.0 ~  labs 1/10 showed BUN= 18, Creat= 1.0 ~  labs 1/11 showed BUN= 16, Creat= 1.0 ~  labs 1/12 showed BUN= 19, Creat= 0.9  ~  8/12: she had f/u DrWebb> his note is reviewed, stable, no changes made. ~  Labs 1/13 showed BUN= 17, Creat= 1.0 ~  8/13: she continues to follow up w/ Nephrology, DrWebb; BP controlled on meds, renal function normal, no changes made. ~  Labs 10/13 showed BUN= 15, Creat= 1.1  BENIGN NEOPLASM OF KIDNEY (ICD-223.0) - followed by DrBorden for ANGIOMYOLIPOMA (7mm right renal lesion- AML confirmed by MRI & followed by ultrasound exams) & renal cysts... she is seen every 35mo for sonars due to her severe anxiety about these benign lesions... ~  3/11: f/u DrBorden for 35mo sonar to eval angiomyolipoma right kidney- no change, reassured...  ~  11/11: f/u DrBorden w/ another sonar= no change AML or cyst;  ~  5/12:  routine 35mo f/u DrBorden w/ sonar> no change in 7mm AML right kidney, bilat renal cysts, sm right kidney... ~  11/12: f/u DrBorden for atrophic left kidney & 0.7cm AML right kidney; Sonar unchanged & she persists in wanting scans every 35mo. ~  5/13:  F/u DrBorden w/ atrophic left kidney & 0.7cm angiomyolipoma right kid; she requests sonar Q76m due to her extreme anxiety & need for constant reassurance- no ch on sonar.  DEGENERATIVE JOINT DISEASE (ICD-715.90) - she sees DrHilts for Ortho... LOW BACK PAIN, CHRONIC (ICD-724.2) - she tells me she is seeing a chiropractor, DrGibson, for "spinal decompression" & improved ~  She had f/u BMD 3/12 at [facility] w/ TScore -0.7 in FemNeck> normal...  GOUT (ICD-274.9) - Uric Acid Jul09 was 8.1 and ALLOPURINOL 100mg /d started by DrWebb... ~  labs 7/10 showed Uric= 5.8 ~  labs 1/11 showed Uric= 5.7 ~  labs 1/12 showed Uric=  6.1  ANXIETY (ICD-300.00) - she has severe anxiety about her health status, doesn't want nerve pills. ~  10/13:  Episode of choking, couldn't get air "in", & running down the hall pounding her chest after a shower; Eval was neg- and rec to take KLONOPIN 0.5mg  1/2-1 Bid.  Health Maintenance - her GYN is ?(NP Maurice March)... BMD at [facility] done 2/ 10 & improved, on calcium, vits, etc... ~  labs 7/09 showed Vit D level = 57 ~  labs 7/10 showed Vit D level = 46 ~  labs 7/11 showed Vit D level = 68 ~  Labs 1/13 showed Vit D level = 57   Past Surgical History  Procedure Date  . Hysterctomy and ooperectomy 1970's  . Thyroid lobectomy for a cyst 10/2000    Dr. Haroldine Laws  . Functional endoscopic sinus surgery 05/2003    Dr. Haroldine Laws    Outpatient Encounter Prescriptions as of 01/22/2012  Medication Sig Dispense Refill  . allopurinol (ZYLOPRIM) 100 MG tablet Take 1 tablet (100 mg total) by mouth daily.  30 tablet  11  . aspirin 81 MG tablet Take 81 mg by mouth daily.        Marland Kitchen atenolol (TENORMIN) 100 MG tablet TAKE 1 TABLET DAILY  90 tablet  1  . Calcium Carbonate-Vitamin D (CALTRATE 600+D) 600-400 MG-UNIT per tablet Take 1 tablet by mouth 2 (two) times daily.        . clonazePAM (KLONOPIN) 0.5 MG tablet Take 1 tablet (0.5 mg total)  by mouth 2 (two) times daily as needed for anxiety.  60 tablet  5  . dorzolamide-timolol (COSOPT) 22.3-6.8 MG/ML ophthalmic solution Place 1 drop into both eyes 2 (two) times daily.      Marland Kitchen DYAZIDE 37.5-25 MG per capsule TAKE ONE (1) CAPSULE EACH MORNING  30 capsule  3  . Guaifenesin (MUCINEX MAXIMUM STRENGTH) 1200 MG TB12 Take 1 tablet by mouth as needed.       . latanoprost (XALATAN) 0.005 % ophthalmic solution Place 1 drop into the right eye at bedtime.        Marland Kitchen levothyroxine (LEVOXYL) 50 MCG tablet Take 1 tablet (50 mcg total) by mouth daily. Please dispense the generic levothyroxine.  30 tablet  5  . LOTREL 5-10 MG per capsule TAKE 1 CAPSULE DAILY  90 capsule  1  . Multiple  Vitamins-Minerals (CENTRUM SILVER PO) Take 1 tablet by mouth daily.        . potassium chloride (MICRO-K) 10 MEQ CR capsule TAKE TWO CAPSULES EVERY DAY  60 capsule  3  . simvastatin (ZOCOR) 40 MG tablet TAKE 1 TABLET DAILY  90 tablet  1  . triamterene-hydrochlorothiazide (DYAZIDE) 37.5-25 MG per capsule Take 1 each (1 capsule total) by mouth every morning.  30 capsule  11  . [DISCONTINUED] meloxicam (MOBIC) 7.5 MG tablet Take 1 tablet (7.5 mg total) by mouth daily as needed for pain. Take with food  30 tablet  0  . [DISCONTINUED] potassium chloride (MICRO-K) 10 MEQ CR capsule Take 10 mEq by mouth 2 (two) times daily.        Allergies  Allergen Reactions  . Amlodipine Besylate   . Clonidine     Sweating, nausea, felt faint  . Diltiazem Hcl   . Doxycycline Hyclate     REACTION:  tetracycline  . Iodine     IVP contrast  . Labetalol Hcl   . Olmesartan Medoxomil   . Penicillins     Pt not allergic--just causes yeast infections    Current Medications, Allergies, Past Medical History, Past Surgical History, Family History, and Social History were reviewed in [employer] record.   Review of Systems         Constitutional:   No  weight loss, night sweats,  Fevers, chills, fatigue, or  lassitude.  HEENT:   No headaches,  Difficulty swallowing,  Tooth/dental problems, or  Sore throat,                No sneezing, itching, ear ache, nasal congestion, post nasal drip,   CV:  No chest pain,  Orthopnea, PND, swelling in lower extremities, anasarca, dizziness, palpitations, syncope.   GI  No heartburn, indigestion,   nausea, vomiting, , loss of appetite, bloody stools.   Resp: No shortness of breath with exertion or at rest.  No excess mucus, no productive cough,  No non-productive cough,  No coughing up of blood.  No change in color of mucus.  No wheezing.  No chest wall deformity  Skin: no rash or lesions.  GU: no dysuria, change in color of urine, no urgency or  frequency.  No flank pain, no hematuria   MS:  No joint pain or swelling.  No decreased range of motion.  No back pain.  Psych:  No change in mood or affect. No depression or anxiety.  No memory loss.        Objective:   Physical Exam     WD, Obese, 79 y/o BF in  NAD.Marland KitchenMarland Kitchen GENERAL:  Alert & oriented; pleasant & cooperative... HEENT:  [place]/AT,    NOSE-clear, THROAT-clear & wnl. NECK:  Supple w/ fairROM; no JVD; normal carotid impulses w/o bruits; Thyroid surg scar, no thyromegaly or nodules palpated; no lymphadenopathy. CHEST:  Clear to P & A; without wheezes/ rales/ or rhonchi heard... HEART:  Regular Rhythm; without murmurs/ rubs/ or gallops detected... ABDOMEN:  Obese soft & nontender; normal bowel sounds; no organomegaly or masses palpated. Neg CVA tenderness, no guarding or rebound  EXT: without deformities, mild arthritic changes; no varicose veins/ venous insuffic/ tr edema. NEURO:    no focal neuro deficits... DERM:  No lesions noted; no rash etc...    Assessment & Plan:

## 2012-01-22 NOTE — Patient Instructions (Addendum)
Advance bland diet as tolerated .  Gas X with meals .  Levsin As needed  For abdominal cramping.  Push fluids  Please contact office for sooner follow up if symptoms do not improve or worsen or seek emergency care

## 2012-02-07 ENCOUNTER — Other Ambulatory Visit: Payer: Self-pay | Admitting: Pulmonary Disease

## 2012-02-07 MED ORDER — TRIAMTERENE-HCTZ 37.5-25 MG PO CAPS: 1.0000 | ORAL_CAPSULE | Freq: Every day | ORAL | Status: DC

## 2012-02-07 NOTE — Telephone Encounter (Signed)
[facility] sent for triamterene/hctz 37.5/25mg  rx sent

## 2012-02-12 ENCOUNTER — Telehealth: Payer: Self-pay | Admitting: Pulmonary Disease

## 2012-02-12 NOTE — Telephone Encounter (Signed)
Spoke with patient and Pt never knew that [facility] requested an rx for dyazide usually gets this thru [facility] . pt will get in touch [facility].plus they sent her generic and she does not want generic for any of her medicine she  wants brand name only.   Pt will have [facility] give [place] a call when she needs her medicine.   Will sign and forward as fyi

## 2012-02-12 NOTE — Telephone Encounter (Signed)
(  continued)  Pt would like to know who approved this refill, who requested this refill, etc.  April Donaldson

## 2012-02-13 ENCOUNTER — Other Ambulatory Visit: Payer: Self-pay | Admitting: Pulmonary Disease

## 2012-03-21 ENCOUNTER — Encounter: Payer: Self-pay | Admitting: Pulmonary Disease

## 2012-03-21 ENCOUNTER — Ambulatory Visit (INDEPENDENT_AMBULATORY_CARE_PROVIDER_SITE_OTHER): Payer: Medicare Other | Admitting: Pulmonary Disease

## 2012-03-21 ENCOUNTER — Other Ambulatory Visit (INDEPENDENT_AMBULATORY_CARE_PROVIDER_SITE_OTHER): Payer: Medicare Other

## 2012-03-21 VITALS — BP 138/78 | HR 61 | Temp 98.7°F | Ht 59.0 in | Wt 193.4 lb

## 2012-03-21 DIAGNOSIS — I679 Cerebrovascular disease, unspecified: Secondary | ICD-10-CM

## 2012-03-21 DIAGNOSIS — E7800 Pure hypercholesterolemia, unspecified: Secondary | ICD-10-CM

## 2012-03-21 DIAGNOSIS — I872 Venous insufficiency (chronic) (peripheral): Secondary | ICD-10-CM

## 2012-03-21 DIAGNOSIS — I10 Essential (primary) hypertension: Secondary | ICD-10-CM

## 2012-03-21 DIAGNOSIS — K219 Gastro-esophageal reflux disease without esophagitis: Secondary | ICD-10-CM

## 2012-03-21 DIAGNOSIS — N259 Disorder resulting from impaired renal tubular function, unspecified: Secondary | ICD-10-CM

## 2012-03-21 DIAGNOSIS — K589 Irritable bowel syndrome without diarrhea: Secondary | ICD-10-CM

## 2012-03-21 DIAGNOSIS — D126 Benign neoplasm of colon, unspecified: Secondary | ICD-10-CM

## 2012-03-21 DIAGNOSIS — Z23 Encounter for immunization: Secondary | ICD-10-CM

## 2012-03-21 DIAGNOSIS — E039 Hypothyroidism, unspecified: Secondary | ICD-10-CM

## 2012-03-21 DIAGNOSIS — E669 Obesity, unspecified: Secondary | ICD-10-CM

## 2012-03-21 DIAGNOSIS — G4733 Obstructive sleep apnea (adult) (pediatric): Secondary | ICD-10-CM

## 2012-03-21 LAB — HEPATIC FUNCTION PANEL
Albumin: 4 g/dL (ref 3.5–5.2)
Bilirubin, Direct: 0 mg/dL (ref 0.0–0.3)
Total Protein: 7.7 g/dL (ref 6.0–8.3)

## 2012-03-21 LAB — LIPID PANEL
Cholesterol: 168 mg/dL (ref 0–200)
LDL Cholesterol: 80 mg/dL (ref 0–99)
VLDL: 30.8 mg/dL (ref 0.0–40.0)

## 2012-03-21 NOTE — Progress Notes (Signed)
Subjective:    Patient ID: April Donaldson, female    DOB: [birth year], 79 y.o.   MRN: [id number]  HPI 79 y/o BF here for a 6 month follow up visit... she has multiple medical problems as noted below... 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  ~  March 20, 2011:  77mo ROV & she states that she "wants to be sure that she is ok"> she had "the flu" w/ dry cough, stuffy, congested, T99+, hurting in ribs, & treated herself w/ OTC meds; then developed epistaxis & went to see DrCrossley "he said it was infection" & Rx w/ ZPak & improved; for reassurance we decided to check CXR (borderline heart size, clear lungs, TSpine w/ mild spondylosis); and LABS (FLP-ok, Chems-ok, CBC-ok, TSH-ok, VitD-57)==> See below: BP remains well controlled; denies CP or cerebral ischemic symptoms; Chol looks good on Simva40; Thyroid ok on Levo50; Renal stable w/ normal Creat; etc... She is given ample reassurance...  ~  September 18, 2011:  77mo ROV & she reports "somewhat OK" w/ mult somatic complaints & questions today; she's been on Levoxyl & no longer avail but she's worried how the generic "will react w/ my body"- I reassured her;  "I'm getting speckled" c/o brown spots (nevi), red spots (cherry hemangiomas), & encouraged to get "body scan" w/ Derm;  She had [facility] screen- all neg/ normal x CDoppler w/ mod left carotid dis ident> we discussed the need for formal CDopplers, note- no bruits heard...  Note- she had last Tetanus 2002 & due for TDAP today...    We reviewed prob list, meds, xrays and labs> see below for updates>> Carotid Dopplers ==> 7/13 showed mild heterogenous plaque- not hemodynam signif- velocities wnl, 0-39% bilat ICA stenoses; plan repeat 79yrs...  ~  December 08, 2011:  77mo ROV  & add on at pt request for an episode of SOB> occurred in the shower 3d ago, ?felt choked, couldn't get a breath, got out of shower, beat herself on the chest & "my breathing started back up", felt like it was a plug, but she is adamant that she did NOT cough up any mucous or a plug of anything; she admits that she was very anxious about this, main sensation was not being able to get the air "in"; she does admit to occas prob getting strangled on saliva (no prob w/ solid foods, just w/ saliva) & she had prev eval by DrCrossley w/ MBS- we can't find these results in Epic...     We reviewed prev labs & XRay results in Epic;  We discussed how cricopharyngeus muscle dysfunction vs VCD can cause these episodes & a lump-like feeling in throat;  She wants further eval & in this regard we discussed checking:   LABS showed:  Chems- wnl & similar to labs 8/13 from [facility];  CBC- ok x wbc=11.0;  TSH=4.37;  Sed=32 CXR showed heart at upper lim of normal, calcif in Ao arch, sl eventration of rt hemidiaph, mild basilar atx, NAD, no change from old films... PFT> poor tracings, best of the 3 w/ normal airflow, no obstruction; FVC=1.86 (104%), FEV1=1.55 (115%), %1sec=83, mid-flows=154% pred... CTChest did not show any findings to explain her symptoms; incidental findings included coronary calcif, clear lungs, ?cyst in liver & right kidney... Med Rx trial w/  Klonopin 0.5mg  Bid is recommended & lots of reassurance given... ADDENDUM >> pt reports that Klonopin seems to be working very well; no episodes since starting Rx; med seems a bit sedating for her & rec to decr to 1/2 tab bid...  ~  March 21, 2012:  77mo ROV & [name] notes that the Klonopin reacted on her- caused "crazy dreams" and even 1/2 tab was too strong so she stopped it; she states that she has not had any recurrence of the SOB, choking, etc & she feels better overall;  She had f/u DrCrossley who wondered about a food allergy & suggested that she come  in to see him immediately if this recurred; She is requesting a new Rx for her Dyazide as she has had a prob w/ [facility] & wants to fill this med locally- Rx written for #90 per request...  We reviewed the following medical problems during today's office visit >>     OSA> hx sleep study 2009 w/ RDI=20, desat to 66%, mild snoring; trial CPAP10 per DrClance & daytime alertness improved; still hasn't lost the weight!    HBP, HxAtypCP> on Aten100, Lotrel5-10, Dyazide, MicroK10-2/d;  BP=138/78 & she feels better- denies CP, palpit, SOB, edema; reminded to elim salt & get wt down.    Cerebrovasc dis> on ASA81; CDopplers 7/13 w/ mild heterogeneous plaque, no signif ICA stenosis; she denies cerebral ischemic symptoms...    Ven Insuffic> she knows to elim salt/sodium, elev legs, wear support hose, & take the Dyazide...    Chol> on Simva40; FLP shows TChol 168, TG 154, HDL 57, LDL 80    Hypothy> on Synthroid50;  Labs 10/13 showed TSH=4.37 & she is clinically euthyroid...    Obesity> on diet, not able to exercise sufficiently; wt=193# is up 14# in 3 mo! & we reviewed diet/ exercise/ wt reduction strategies...    GI- GERD, IBS, Polyps> she uses OTC Prilosec20 as needed; last colon 3/11 by DrGessner showed tort colon divertics, stenosis in sigmoid, hems & 2 sm adenomas- f/u 35yrs.    Renal Infuffic, Angiomyolipoma> followed by DrBorden, AML confirmed by MRI, she is seen Q42mo due to her severe anxiety about the lesion- seen 11/13 & no change; Creat stable at ~1.0    DJD, Hx Gout, LBP> followed by her Chiropractor & Ortho- DrHilts; recently checked her for c/o hip pain; on Allopurinol100 & Uric= 6.6    Anxiety> she is off the [employer] Rx... We reviewed prob list, meds, xrays and labs> see below for updates >>  LABS 1/14:  FLP- at goals on Simva40;  Chems- LFTs wnl;  Uric- 6.6          Problem List:  GLAUCOMA (ICD-365.9) - followed by DrShapiro on gtts... 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  OBSTRUCTIVE  SLEEP APNEA (ICD-780.57) - Hx borderline sleep study in past w/ RDI=12, desat to 87%... increased symptoms of daytime hypersomn resulted in repeat sleep study 8/09 w/ RDI= 20, mild snoring, & desat to 66%- so she was started on CPAP by DrClance, adjusted to 10cm pressure and she reports doing well after mask adjustments... ~  2/12:  yearly f/u DrClance- doing well, no changes made. ~  3/13:  She saw DrClance in follow up> using CPAP compliantly, no mask issues, daytime alertness is iproved, asked to work on wt reduction. ~  10/13: CXR showed heart at upper lim of normal, calcif in Ao arch, sl eventration of rt hemidiaph, mild basilar atx, NAD, no change from old films... ~  10/13: PFT> poor tracings, best of the 3 w/ normal airflow, no obstruction; FVC=1.86 (104%), FEV1=1.55 (115%), %1sec=83, mid-flows=154% pred... ~  10/13: CTChest did not show any findings to explain her symptoms (SOB- choking, difficulty getting the air in); incidental findings included coronary calcif, clear lungs, ?cyst in liver & right kidney...  HYPERTENSION (ICD-401.9) - controlled on ATENOLOL 100mg /d, LOTREL 5-10 daily, & DYAZIDE 1/d, & KCl - 2daily... ~  2DEcho 4/08 was normal w/ EF=55-65%...  ~  NuclearStressTest 4/08 was negative- no ischemia & EF=79% (similiar to 2002)... ~  Dobutamine stress Echo 4/10 was neg as well... ~  CXR 1/11 showed stable cardiomeg, ectasia of thorAo, incr markings bases, NAD.Marland Kitchen. ~  CXR 1/13 showed borderline heart size, clear lungs, TSpine w/ mild spondylosis... ~  CXR 10/13 showed heart at upper lim of normal, calcif in Ao arch, sl eventration of rt hemidiaph, mild basilar atx, NAD, no change from old films... ~  CTChest 10/13 showed normal heart size, no effusions, clear lungs, +coronary calcif, cysts on liver & right kidney... ~  on Aten100, Lotrel5-10, Dyazide, MicroK10-2/d;  BP=138/78 & she feels better- denies CP, palpit, SOB, edema; reminded to elim salt & get wt down.  CHEST PAIN,  ATYPICAL (ICD-786.59) - she went to the ER 3/10 w/ atyp CP & ruled out for MI- seen by Walker Kehr w/ neg DobutEcho 4/10 (no change in meds)...  CEREBROVASCULAR DISEASE (ICD-437.9) - on ASA 81mg /d... prev CDopplers 6/04 showed mild plaque, 0-39% bilat... ~  CDopplers 7/13 showed mild heterogenous plaque- not hemodynam signif- velocities wnl, 0-39% bilat ICA stenoses; plan repeat 70yrs... ~  1/14:  She denies cerebral ischemic symptoms & appears stable on ASA 81mg /d...  VENOUS INSUFFICIENCY (ICD-459.81) - on low sodium diet, +elevation, +support hose, & Dyazide...  HYPERCHOLESTEROLEMIA (ICD-272.0) - on SIMVASTATIN 40mg /d + diet efforts... ~  FLP 7/08 showed TChol 156, TG 118, HDL 55, LDL 78 ~  FLP 7/09 showed TChol 159, TG 142, HDL 48, LDL 83... rec- continue same. ~  FLP 7/10 showed TChol 151, TG 81, HDL 59, LDL 76... continue Simva40. ~  FLP 1/11 showed TChol 155, TG 148, HDL 48, LDL 77 ~  FLP 7/11 showed TChol 147, TG 103, HDL 56, LDL 71... continue Simva40. ~  FLP 1/12 on Simva40 showed TChol 162, TG 110, HDL 60, LDL 80 ~  FLP 1/13 on Simva40 showed TChol 156, TG 92, HDL 60, LDL 78 ~  FLP 1/14 on Simva40 showed TChol 168, TG 154, HDL 57, LDL 80  HYPOTHYROIDISM (ICD-244.9) - on LEVOXYL 36mcg/d> s/p R thyroid lobectomy 2002 by DrCrossley for cyst...  ~  labs 1/09 on Levoxyl50 showed TSH= 3.10 ~  labs 7/09 on Levoxyl50 showed TSH= 2.34 ~  labs 1/10 showed TSH= 3.78... continue LEVOXYL50 ~  labs 7/10 showed TSH= 2.54 ~  labs 1/11 showed TSH= 2.95 ~  labs 7/11 showed TSH= 2.64 ~  labs 1/12 on Levo50 showed TSH= 3.59 ~  Labs 1/13 on Levo50 showed TSH= 4.95 ~  Labs 10/13 showed TSH= 4.37 & she is clinically euthyroid...  OBESITY (ICD-278.00) -  she knows that she needs to diet, exercise and get her weight down! ~  weight 1/10 = 193#,  she is 4\' 10"  tall,  BMI= 40... ~  weight 7/10 = 194# ~  weight 1/11 = 188# ~  weight 7/11 = 191# ~  weight 1/12 = 186# ~  Weight 7/12 = 187# ~  Weight 1/13  = 186# ~  Weight 7/13 =  189# ~  Weight 10/13 = 179#... Good job w/ wt reduction! ~  Weight 1/14 = 193#... What happened?  GERD (ICD-530.81) - she uses OTC Prilosec 20mg  Prn... she saw DrGessner in Feb09- doing satis... ~  [facility] records indicate she had an ENT eval by DrCrossley 5/12 w/ Ba swallow but we don't have his notes or the test results...  IRRITABLE BOWEL SYNDROME (ICD-564.1) &  COLONIC POLYPS (ICD-211.3) - colonoscopy 3/06 by DrSamLeBauer showed extremely tortuous colon, divertics, sm polyp... f/u 67yrs... ~  colonoscopy 3/11 by DrGessner showed divertics, stenosis in sigmoid, hems, & 2 sm polyps= adenoma fragments, f/u 6yrs. ~  She saw TP 11/13 for a gastroenteritis that resolved on it's own...  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  ~  labs 1/09 showed BUN= 15, Creat= 1.0 ~  labs 1/10 showed BUN= 18, Creat= 1.0 ~  labs 1/11 showed BUN= 16, Creat= 1.0 ~  labs 1/12 showed BUN= 19, Creat= 0.9  ~  8/12: she had f/u DrWebb> his note is reviewed, stable, no changes made. ~  Labs 1/13 showed BUN= 17, Creat= 1.0 ~  8/13: she continues to follow up w/ Nephrology, DrWebb; BP controlled on meds, renal function normal, no changes made. ~  Labs 10/13 showed BUN= 15, Creat= 1.1  BENIGN NEOPLASM OF KIDNEY (ICD-223.0) - followed by DrBorden for ANGIOMYOLIPOMA (7mm right renal lesion- AML confirmed by MRI & followed by ultrasound exams) & renal cysts... she is seen every 99mo for sonars due to her severe anxiety about these benign lesions... ~  3/11: f/u DrBorden for 99mo sonar to eval angiomyolipoma right kidney- no change, reassured...  ~  11/11: f/u DrBorden w/ another sonar= no change AML or cyst;  ~  5/12:   routine 99mo f/u DrBorden w/ sonar> no change in 7mm AML right kidney, bilat renal cysts, sm right kidney... ~  11/12: f/u DrBorden for atrophic left kidney & 0.7cm AML right kidney; Sonar unchanged & she persists in wanting scans every 99mo. ~  5/13:  F/u DrBorden w/ atrophic left kidney & 0.7cm angiomyolipoma right kid; she requests sonar Q47m due to her extreme anxiety & need for constant reassurance- no ch on sonar. ~  11/13:  She was rechecked by DrBorden> sonar unchanged & she is reassured...  DEGENERATIVE JOINT DISEASE (ICD-715.90) - she sees DrHilts for Ortho... LOW BACK PAIN, CHRONIC (ICD-724.2) - she tells me she is seeing a chiropractor, DrGibson, for "spinal decompression" & improved ~  She had f/u BMD 3/12 at [facility] w/ TScore -0.7 in FemNeck> normal... ~  Lumbar spine MRI 11/13 by DrHilts> mild multifactorial spinal stenosis L2-3, mod multifac sp stenosis L3-4 w/ left foraminal stenosis, mod multifac sp stenosis L4-5 w/ right foraminal stenosis.  GOUT (ICD-274.9) - Uric Acid Jul09 was 8.1 and ALLOPURINOL 100mg /d started by DrWebb... ~  labs 7/10 showed Uric= 5.8 ~  labs 1/11 showed Uric= 5.7 ~  labs 1/12 showed Uric= 6.1  ANXIETY (ICD-300.00) - she has severe anxiety about her health status, doesn't want nerve pills. ~  10/13:  Episode of choking, couldn't get air "in", & running down the hall pounding her chest after a shower; Eval was neg- and rec to take KLONOPIN 0.5mg  1/2-1 Bid.  Health Maintenance - her  GYN is ?(NP Maurice March)... BMD at SER done 2/ 10 & improved, on calcium, vits, etc... ~  labs 7/09 showed Vit D level = 57 ~  labs 7/10 showed Vit D level = 46 ~  labs 7/11 showed Vit D level = 68 ~  Labs 1/13 showed Vit D level = 57   Past Surgical History  Procedure Date  . Hysterctomy and ooperectomy 1970's  . Thyroid lobectomy for a cyst 10/2000    Dr. Haroldine Laws  . Functional endoscopic sinus surgery 05/2003    Dr. Haroldine Laws    Outpatient Encounter Prescriptions as of  03/21/2012  Medication Sig Dispense Refill  . allopurinol (ZYLOPRIM) 100 MG tablet TAKE ONE (1) TABLET EACH DAY  30 tablet  11  . aspirin 81 MG tablet Take 81 mg by mouth daily.        Marland Kitchen atenolol (TENORMIN) 100 MG tablet TAKE 1 TABLET DAILY  90 tablet  1  . Calcium Carbonate-Vitamin D (CALTRATE 600+D) 600-400 MG-UNIT per tablet Take 1 tablet by mouth 2 (two) times daily.        . clonazePAM (KLONOPIN) 0.5 MG tablet Take 1 tablet (0.5 mg total) by mouth 2 (two) times daily as needed for anxiety.  60 tablet  5  . dorzolamide-timolol (COSOPT) 22.3-6.8 MG/ML ophthalmic solution Place 1 drop into both eyes 2 (two) times daily.      . Guaifenesin (MUCINEX MAXIMUM STRENGTH) 1200 MG TB12 Take 1 tablet by mouth as needed.       . latanoprost (XALATAN) 0.005 % ophthalmic solution Place 1 drop into the right eye at bedtime.        Marland Kitchen levothyroxine (LEVOXYL) 50 MCG tablet Take 1 tablet (50 mcg total) by mouth daily. Please dispense the generic levothyroxine.  30 tablet  5  . LOTREL 5-10 MG per capsule TAKE 1 CAPSULE DAILY  90 capsule  1  . Multiple Vitamins-Minerals (CENTRUM SILVER PO) Take 1 tablet by mouth daily.        . potassium chloride (MICRO-K) 10 MEQ CR capsule TAKE TWO CAPSULES EVERY DAY  60 capsule  3  . simvastatin (ZOCOR) 40 MG tablet TAKE 1 TABLET DAILY  90 tablet  1  . triamterene-hydrochlorothiazide (DYAZIDE) 37.5-25 MG per capsule Take 1 each (1 capsule total) by mouth daily.  90 capsule  1  . [DISCONTINUED] allopurinol (ZYLOPRIM) 100 MG tablet Take 1 tablet (100 mg total) by mouth daily.  30 tablet  11  . [DISCONTINUED] hyoscyamine (LEVSIN/SL) 0.125 MG SL tablet Place 1 tablet (0.125 mg total) under the tongue every 4 (four) hours as needed for cramping.  30 tablet  0  . [DISCONTINUED] triamterene-hydrochlorothiazide (DYAZIDE) 37.5-25 MG per capsule Take 1 each (1 capsule total) by mouth every morning.  30 capsule  11    Allergies  Allergen Reactions  . Amlodipine Besylate   . Clonidine       Sweating, nausea, felt faint  . Diltiazem Hcl   . Doxycycline Hyclate     REACTION:  tetracycline  . Iodine     IVP contrast  . Labetalol Hcl   . Olmesartan Medoxomil   . Penicillins     Pt not allergic--just causes yeast infections    Current Medications, Allergies, Past Medical History, Past Surgical History, Family History, and Social History were reviewed in [employer] record.   Review of Systems        See HPI - all other systems neg except as noted...  The patient  complains of dyspnea on exertion.  The patient denies anorexia, fever, weight loss, weight gain, vision loss, decreased hearing, hoarseness, chest pain, syncope, peripheral edema, prolonged cough, headaches, hemoptysis, abdominal pain, melena, hematochezia, severe indigestion/heartburn, hematuria, incontinence, muscle weakness, suspicious skin lesions, transient blindness, difficulty walking, depression, unusual weight change, abnormal bleeding, enlarged lymph nodes, and angioedema.     Objective:   Physical Exam     WD, Obese, 80 y/o BF in NAD... GENERAL:  Alert & oriented; pleasant & cooperative... HEENT:  [place]/AT, EOM-wnl, PERRLA, EACs-clear, TMs-wnl, NOSE-clear, THROAT-clear & wnl. NECK:  Supple w/ fairROM; no JVD; normal carotid impulses w/o bruits; Thyroid surg scar, no thyromegaly or nodules palpated; no lymphadenopathy. CHEST:  Clear to P & A; without wheezes/ rales/ or rhonchi heard... HEART:  Regular Rhythm; without murmurs/ rubs/ or gallops detected... ABDOMEN:  Obese soft & nontender; normal bowel sounds; no organomegaly or masses palpated... EXT: without deformities, mild arthritic changes; no varicose veins/ venous insuffic/ tr edema. NEURO:  CN's intact; no focal neuro deficits... DERM:  No lesions noted; no rash etc...  RADIOLOGY DATA:  Reviewed in the EPIC EMR & discussed w/ the patient...    >>CXR 1/13 showed borderline heart size, clear lungs, TSpine w/ mild  spondylosis...  LABORATORY DATA:  Reviewed in the EPIC EMR & discussed w/ the patient...    >>LABS 1/13 revealed FLP-ok, Chems-ok, CBC-ok, TSH-ok, VitD-57...   Assessment & Plan:    ?Cricopharyngeus dysfunction, choking, dyspnea, anxiety>  Eval is neg as above & rec to try KLONOPIN 0.5mg - 1/2-1 Bid...  OSA>  Followed by DrClance yearly; she wear CPAP compliantly, no mask issues except when "congested", no daytime hypersomnolence etc...  HBP>  Controlled on meds as listed, continue same...  Hx AtypicCP & mild cerebrovasc plaque>  On ASA w/o ischemic symptoms; CDopplers are neg...  CHOL>  Stable on diet + Simva40, needs to lose weight!  Hypothyroid>  On Levoxyl 34mcg/d, continue same...  GI>  GERD, IBS, Colon Polyps>  Followed by DrGessner, stable on OTC Prilosec but doesn't want antispasmotic etc...  GU>  Atrophic right kidney, sm renal cysts, left angiomyolipoma, normal renal function> Followed by DrBorden every 3mo & also sees DrWebb periodically as well...  DJD/ GOUT>  Stable w/o acute gout attacks...  Anxiety>  She still declines anxiolytic Rx, states she was ultimately intol to the Klonopin as well...   Patient's Medications  New Prescriptions   No medications on file  Previous Medications   ALLOPURINOL (ZYLOPRIM) 100 MG TABLET    TAKE ONE (1) TABLET EACH DAY   ASPIRIN 81 MG TABLET    Take 81 mg by mouth daily.     ATENOLOL (TENORMIN) 100 MG TABLET    TAKE 1 TABLET DAILY   CALCIUM CARBONATE-VITAMIN D (CALTRATE 600+D) 600-400 MG-UNIT PER TABLET    Take 1 tablet by mouth 2 (two) times daily.     CLONAZEPAM (KLONOPIN) 0.5 MG TABLET    Take 1 tablet (0.5 mg total) by mouth 2 (two) times daily as needed for anxiety.   DORZOLAMIDE-TIMOLOL (COSOPT) 22.3-6.8 MG/ML OPHTHALMIC SOLUTION    Place 1 drop into both eyes 2 (two) times daily.   GUAIFENESIN (MUCINEX MAXIMUM STRENGTH) 1200 MG TB12    Take 1 tablet by mouth as needed.    LATANOPROST (XALATAN) 0.005 % OPHTHALMIC SOLUTION     Place 1 drop into the right eye at bedtime.     LEVOTHYROXINE (LEVOXYL) 50 MCG TABLET    Take 1 tablet (50 mcg total)  by mouth daily. Please dispense the generic levothyroxine.   LOTREL 5-10 MG PER CAPSULE    TAKE 1 CAPSULE DAILY   MULTIPLE VITAMINS-MINERALS (CENTRUM SILVER PO)    Take 1 tablet by mouth daily.     POTASSIUM CHLORIDE (MICRO-K) 10 MEQ CR CAPSULE    TAKE TWO CAPSULES EVERY DAY   SIMVASTATIN (ZOCOR) 40 MG TABLET    TAKE 1 TABLET DAILY   TRIAMTERENE-HYDROCHLOROTHIAZIDE (DYAZIDE) 37.5-25 MG PER CAPSULE    Take 1 each (1 capsule total) by mouth daily.  Modified Medications   No medications on file  Discontinued Medications   ALLOPURINOL (ZYLOPRIM) 100 MG TABLET    Take 1 tablet (100 mg total) by mouth daily.   HYOSCYAMINE (LEVSIN/SL) 0.125 MG SL TABLET    Place 1 tablet (0.125 mg total) under the tongue every 4 (four) hours as needed for cramping.   TRIAMTERENE-HYDROCHLOROTHIAZIDE (DYAZIDE) 37.5-25 MG PER CAPSULE    Take 1 each (1 capsule total) by mouth every morning.

## 2012-03-21 NOTE — Patient Instructions (Addendum)
Today we updated your med list in our EPIC system...    Continue your current medications the same...    We wrote a new prescription for DYAZIDE as you requested...  Today we did your FASTING blood work...    We will contact you w/ the results when avail...  Call for any questions...  Let's plan a routine follow up in 6 months, call sooner if needed for any problems.Marland KitchenMarland Kitchen

## 2012-04-01 ENCOUNTER — Telehealth: Payer: Self-pay | Admitting: Pulmonary Disease

## 2012-04-01 NOTE — Telephone Encounter (Signed)
Pt is requesting lab results from 03-21-12, reports printed. Please advise. Carron Curie, CMA

## 2012-04-04 NOTE — Telephone Encounter (Signed)
SN, pls advise on pt's labs.

## 2012-04-04 NOTE — Telephone Encounter (Signed)
Pt called again.  Call her back @ [phone].  I advised her we were waiting on SN to review her labs and then we would call her back.  She voiced her understanding. Leanora Ivanoff

## 2012-04-08 NOTE — Telephone Encounter (Signed)
Please advise lab results. thanks 

## 2012-04-08 NOTE — Telephone Encounter (Signed)
Entered by Michele Mcalpine, MD at 03/22/2012 5:20 PM   .[name]> Lipid profile shows Chol numbers are good on the Simva40, but TG sl elev & requires a better low fat diet & weight reduction!!! Uric acid level is normal on the allopurinol 100mg /d- continue same... SMN                        Spoke with patient-aware of lab results and no further questions.

## 2012-04-19 ENCOUNTER — Other Ambulatory Visit: Payer: Self-pay | Admitting: Pulmonary Disease

## 2012-05-06 ENCOUNTER — Ambulatory Visit: Payer: Medicare Other | Admitting: Pulmonary Disease

## 2012-05-14 ENCOUNTER — Ambulatory Visit: Payer: Medicare Other | Admitting: Pulmonary Disease

## 2012-05-16 ENCOUNTER — Other Ambulatory Visit: Payer: Self-pay | Admitting: Pulmonary Disease

## 2012-05-17 ENCOUNTER — Other Ambulatory Visit: Payer: Self-pay | Admitting: Pulmonary Disease

## 2012-05-27 ENCOUNTER — Encounter: Payer: Self-pay | Admitting: Pulmonary Disease

## 2012-05-30 ENCOUNTER — Encounter: Payer: Self-pay | Admitting: Pulmonary Disease

## 2012-05-30 ENCOUNTER — Ambulatory Visit (INDEPENDENT_AMBULATORY_CARE_PROVIDER_SITE_OTHER): Payer: Medicare Other | Admitting: Pulmonary Disease

## 2012-05-30 VITALS — BP 128/80 | HR 62 | Temp 97.7°F | Ht 59.0 in | Wt 189.2 lb

## 2012-05-30 DIAGNOSIS — G4733 Obstructive sleep apnea (adult) (pediatric): Secondary | ICD-10-CM

## 2012-05-30 NOTE — Assessment & Plan Note (Signed)
The patient is having issues with CPAP tolerance the last few weeks, primarily secondary to mouth dryness and some increased mucus.  I have gone over the various causes of this, and would like her to try and increase the heat on her humidifier.  I suspect the weather getting warmer will help her, since she will be turning down her heat in her home.  I've also asked her to monitor her how much mouth opening she may be doing.  Finally, I've encouraged her to work aggressively on weight loss.

## 2012-05-30 NOTE — Progress Notes (Signed)
  Subjective:    Patient ID: April Donaldson, female    DOB: May 02, 1931, 79 y.o.   MRN: [id number]  HPI The patient comes in today for followup of her obstructive sleep apnea.  She had been wearing CPAP compliantly, and doing well with the device.  The last 3 weeks she has stopped using it, partially because of concerns over electrical issues in her house.  She is also having increased issues with mouth dryness, and has not tried adjusting her heat humidifier.  She also uses a nasal mask with a chin strap, and thinks she may be opening her mouth at times.  She does not feel that she can use a full face mask at this time.  The patient's weight is down 2 pounds since last visit.   Review of Systems  Constitutional: Negative for fever and unexpected weight change.  HENT: Positive for congestion, rhinorrhea and sinus pressure. Negative for ear pain, nosebleeds, sore throat, sneezing, trouble swallowing, dental problem and postnasal drip.   Eyes: Negative for redness and itching.  Respiratory: Negative for cough, chest tightness, shortness of breath and wheezing.   Cardiovascular: Positive for leg swelling ( ankles). Negative for palpitations.  Gastrointestinal: Negative for nausea and vomiting.  Genitourinary: Negative for dysuria.  Musculoskeletal: Negative for joint swelling.  Skin: Negative for rash.  Neurological: Negative for headaches.  Hematological: Does not bruise/bleed easily.  Psychiatric/Behavioral: Negative for dysphoric mood. The patient is not nervous/anxious.        Objective:   Physical Exam Obese female in no acute distress Nose without purulence or discharge noted No skin breakdown or pressure necrosis from the CPAP mask Lower extremities with edema, no cyanosis Alert and oriented, does not appear to be overly sleepy, moves all 4 extremities.       Assessment & Plan:

## 2012-05-30 NOTE — Patient Instructions (Addendum)
Get back on cpap, and let me know if you are still having tolerance issues Keep up with mask changes and supplies. Work on weight loss. If your mouth dryness continues, can increase heat on humidifier.  Would also make sure you are not opening your mouth a lot during the night. followup with me in one year.

## 2012-06-24 ENCOUNTER — Telehealth: Payer: Self-pay | Admitting: Pulmonary Disease

## 2012-06-24 NOTE — Telephone Encounter (Signed)
Forms completed and placed on SN cart to be signed.

## 2012-06-24 NOTE — Telephone Encounter (Signed)
Forms have been signed by [facility] and faxed back to [phone].  Called and lmom to make the pt aware.

## 2012-06-28 ENCOUNTER — Encounter: Payer: Self-pay | Admitting: Pulmonary Disease

## 2012-09-18 ENCOUNTER — Encounter: Payer: Self-pay | Admitting: Pulmonary Disease

## 2012-09-18 ENCOUNTER — Ambulatory Visit (INDEPENDENT_AMBULATORY_CARE_PROVIDER_SITE_OTHER): Payer: Medicare Other | Admitting: Pulmonary Disease

## 2012-09-18 VITALS — BP 126/68 | HR 57 | Temp 97.5°F | Ht 59.5 in | Wt 187.6 lb

## 2012-09-18 DIAGNOSIS — I872 Venous insufficiency (chronic) (peripheral): Secondary | ICD-10-CM

## 2012-09-18 DIAGNOSIS — E039 Hypothyroidism, unspecified: Secondary | ICD-10-CM

## 2012-09-18 DIAGNOSIS — K589 Irritable bowel syndrome without diarrhea: Secondary | ICD-10-CM

## 2012-09-18 DIAGNOSIS — G4733 Obstructive sleep apnea (adult) (pediatric): Secondary | ICD-10-CM

## 2012-09-18 DIAGNOSIS — E669 Obesity, unspecified: Secondary | ICD-10-CM

## 2012-09-18 DIAGNOSIS — E7800 Pure hypercholesterolemia, unspecified: Secondary | ICD-10-CM

## 2012-09-18 DIAGNOSIS — K219 Gastro-esophageal reflux disease without esophagitis: Secondary | ICD-10-CM

## 2012-09-18 DIAGNOSIS — D126 Benign neoplasm of colon, unspecified: Secondary | ICD-10-CM

## 2012-09-18 DIAGNOSIS — F411 Generalized anxiety disorder: Secondary | ICD-10-CM

## 2012-09-18 DIAGNOSIS — I10 Essential (primary) hypertension: Secondary | ICD-10-CM

## 2012-09-18 DIAGNOSIS — M1990 Unspecified osteoarthritis, unspecified site: Secondary | ICD-10-CM

## 2012-09-18 DIAGNOSIS — M545 Low back pain: Secondary | ICD-10-CM

## 2012-09-18 DIAGNOSIS — I679 Cerebrovascular disease, unspecified: Secondary | ICD-10-CM

## 2012-09-18 DIAGNOSIS — D3001 Benign neoplasm of right kidney: Secondary | ICD-10-CM

## 2012-09-18 NOTE — Patient Instructions (Addendum)
Today we updated your med list in our EPIC system...    Continue your current medications the same...  We gave you a low sodium diet sheet & you can check this out on Google as well...    If you find the swelling gets worse- call for a stronger diuretic...  Call for any questions...  Let's plan a follow up visit in 27mo w/ fasting blood work at that time.Marland KitchenMarland Kitchen

## 2012-09-18 NOTE — Progress Notes (Signed)
Subjective:    Patient ID: April Donaldson, female    DOB: 02/19/1932, 79 y.o.   MRN: [id number]  HPI 79 y/o BF here for a 6 month follow up visit... she has multiple medical problems as noted below... 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  ~  March 20, 2011:  47mo ROV & she states that she "wants to be sure that she is ok"> she had "the flu" w/ dry cough, stuffy, congested, T99+, hurting in ribs, & treated herself w/ OTC meds; then developed epistaxis & went to see DrCrossley "he said it was infection" & Rx w/ ZPak & improved; for reassurance we decided to check CXR (borderline heart size, clear lungs, TSpine w/ mild spondylosis); and LABS (FLP-ok, Chems-ok, CBC-ok, TSH-ok, VitD-57)==> See below: BP remains well controlled; denies CP or cerebral ischemic symptoms; Chol looks good on Simva40; Thyroid ok on Levo50; Renal stable w/ normal Creat; etc... She is given ample reassurance...  ~  September 18, 2011:  47mo ROV & she reports "somewhat OK" w/ mult somatic complaints & questions today; she's been on Levoxyl & no longer avail but she's worried how the generic "will react w/ my body"- I reassured her;  "I'm getting speckled" c/o brown spots (nevi), red spots (cherry hemangiomas), & encouraged to get "body scan" w/ Derm;  She had [facility] screen- all neg/ normal x CDoppler w/ mod left carotid dis ident> we discussed the need for formal CDopplers, note- no bruits heard...  Note- she had last Tetanus 2002 & due for TDAP today...    We reviewed prob list, meds, xrays and labs> see below for updates>> Carotid Dopplers ==> 7/13 showed mild heterogenous plaque- not hemodynam signif- velocities wnl, 0-39% bilat ICA stenoses; plan repeat 79yrs...  ~  December 08, 2011:  77mo ROV  & add on at pt request for an episode of SOB> occurred in the shower 3d ago, ?felt choked, couldn't get a breath, got out of shower, beat herself on the chest & "my breathing started back up", felt like it was a plug, but she is adamant that she did NOT cough up any mucous or a plug of anything; she admits that she was very anxious about this, main sensation was not being able to get the air "in"; she does admit to occas prob getting strangled on saliva (no prob w/ solid foods, just w/ saliva) & she had prev eval by DrCrossley w/ MBS- we can't find these results in Epic...     We reviewed prev labs & XRay results in Epic;  We discussed how cricopharyngeus muscle dysfunction vs VCD can cause these episodes & a lump-like feeling in throat;  She wants further eval & in this regard we discussed checking:   LABS showed:  Chems- wnl & similar to labs 8/13 from [facility];  CBC- ok x wbc=11.0;  TSH=4.37;  Sed=32 CXR showed heart at upper lim of normal, calcif in Ao arch, sl eventration of rt hemidiaph, mild basilar atx, NAD, no change from old films... PFT> poor tracings, best of the 3 w/ normal airflow, no obstruction; FVC=1.86 (104%), FEV1=1.55 (115%), %1sec=83, mid-flows=154% pred... CTChest did not show any findings to explain her symptoms; incidental findings included coronary calcif, clear lungs, ?cyst in liver & right kidney... Med Rx trial w/  Klonopin 0.5mg  Bid is recommended & lots of reassurance given... ADDENDUM >> pt reports that Klonopin seems to be working very well; no episodes since starting Rx; med seems a bit sedating for her & rec to decr to 1/2 tab bid...  ~  March 21, 2012:  77mo ROV & April Donaldson notes that the Klonopin reacted on her- caused "crazy dreams" and even 1/2 tab was too strong so she stopped it; she states that she has not had any recurrence of the SOB, choking, etc & she feels better overall;  She had f/u DrCrossley who wondered about a food allergy & suggested that she come  in to see him immediately if this recurred; She is requesting a new Rx for her Dyazide as she has had a prob w/ [facility] & wants to fill this med locally- Rx written for #90 per request...  We reviewed the following medical problems during today's office visit >>     OSA> hx sleep study 2009 w/ RDI=20, desat to 66%, mild snoring; trial CPAP10 per DrClance & daytime alertness improved; still hasn't lost the weight!    HBP, HxAtypCP> on Aten100, Lotrel5-10, Dyazide, MicroK10-2/d;  BP=138/78 & she feels better- denies CP, palpit, SOB, edema; reminded to elim salt & get wt down.    Cerebrovasc dis> on ASA81; CDopplers 7/13 w/ mild heterogeneous plaque, no signif ICA stenosis; she denies cerebral ischemic symptoms...    Ven Insuffic> she knows to elim salt/sodium, elev legs, wear support hose, & take the Dyazide...    Chol> on Simva40; FLP shows TChol 168, TG 154, HDL 57, LDL 80    Hypothy> on Synthroid50;  Labs 10/13 showed TSH=4.37 & she is clinically euthyroid...    Obesity> on diet, not able to exercise sufficiently; wt=193# is up 14# in 3 mo! & we reviewed diet/ exercise/ wt reduction strategies...    GI- GERD, IBS, Polyps> she uses OTC Prilosec20 as needed; last colon 3/11 by DrGessner showed tort colon divertics, stenosis in sigmoid, hems & 2 sm adenomas- f/u 64yrs.    Renal Infuffic, Angiomyolipoma> followed by DrBorden, AML confirmed by MRI, she is seen Q41mo due to her severe anxiety about the lesion- seen 11/13 & no change; Creat stable at ~1.0    DJD, Hx Gout, LBP> followed by her Chiropractor & Ortho- DrHilts; recently checked her for c/o hip pain; on Allopurinol100 & Uric= 6.6    Anxiety> she is off the [employer] Rx... We reviewed prob list, meds, xrays and labs> see below for updates >>  LABS 1/14:  FLP- at goals on Simva40;  Chems- LFTs wnl;  Uric- 6.6  ~  September 18, 2012:  77mo ROV & April Donaldson indicates "I'm doing pretty good" just c/o some swelling in her legs L foot > right foot & we reviewed  dietary sodium restriction, elevation, support hose (she doesn't want a stronger diuretic);      She saw DrClance 4/14 for OSA- using the CPAP only intermittently & he rec incr humidity & wt reduction...    BP is well controlled on her Aten, Lotrel, Dyazide; BP= 126/68 today & she denies CP, palpit SOB, etc...    She remains on Simva40 for her Lipids and last FLP 1/14 was essentially within parameters...     She is also taking Synthroid50 7 remains clinically euthyropid w/ TSH 10/13 = 4.37    She has lost a few lbs down 5# to 188# (which in her 61" frame is a BMI= 38-9; we reviewed diet,  exercise, wt reduction strategies... We reviewed prob list, meds, xrays and labs> see below for updates >>            Problem List:  GLAUCOMA (ICD-365.9) - followed by DrShapiro on gtts... 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  OBSTRUCTIVE SLEEP APNEA (ICD-780.57) - Hx borderline sleep study in past w/ RDI=12, desat to 87%... increased symptoms of daytime hypersomn resulted in repeat sleep study 8/09 w/ RDI= 20, mild snoring, & desat to 66%- so she was started on CPAP by DrClance, adjusted to 10cm pressure and she reports doing well after mask adjustments... ~  2/12:  yearly f/u DrClance- doing well, no changes made. ~  3/13:  She saw DrClance in follow up> using CPAP compliantly, no mask issues, daytime alertness is iproved, asked to work on wt reduction. ~  10/13: CXR showed heart at upper lim of normal, calcif in Ao arch, sl eventration of rt hemidiaph, mild basilar atx, NAD, no change from old films... ~  10/13: PFT> poor tracings, best of the 3 w/ normal airflow, no obstruction; FVC=1.86 (104%), FEV1=1.55 (115%), %1sec=83, mid-flows=154% pred... ~  10/13: CTChest did not show any findings to explain her symptoms (SOB- choking, difficulty getting the air in); incidental findings included coronary calcif, clear lungs, ?cyst in liver & right kidney... ~  4/14: she saw DrClance for Sleep f/u> rec to incr  humidity 7 work on wt reduction...  HYPERTENSION (ICD-401.9) - controlled on ATENOLOL 100mg /d, LOTREL 5-10 daily, & DYAZIDE 1/d, & KCl - 2daily... ~  2DEcho 4/08 was normal w/ EF=55-65%...  ~  NuclearStressTest 4/08 was negative- no ischemia & EF=79% (similiar to 2002)... ~  Dobutamine stress Echo 4/10 was neg as well... ~  CXR 1/11 showed stable cardiomeg, ectasia of thorAo, incr markings bases, NAD.Marland Kitchen. ~  CXR 1/13 showed borderline heart size, clear lungs, TSpine w/ mild spondylosis... ~  CXR 10/13 showed heart at upper lim of normal, calcif in Ao arch, sl eventration of rt hemidiaph, mild basilar atx, NAD, no change from old films... ~  CTChest 10/13 showed normal heart size, no effusions, clear lungs, +coronary calcif, cysts on liver & right kidney... ~  1/14: on Aten100, Lotrel5-10, Dyazide, MicroK10-2/d;  BP=138/78 & she feels better- denies CP, palpit, SOB, edema; reminded to elim salt & get wt down.  CHEST PAIN, ATYPICAL (ICD-786.59) - she went to the ER 3/10 w/ atyp CP & ruled out for MI- seen by Walker Kehr w/ neg DobutEcho 4/10 (no change in meds)...  CEREBROVASCULAR DISEASE (ICD-437.9) - on ASA 81mg /d... prev CDopplers 6/04 showed mild plaque, 0-39% bilat... ~  CDopplers 7/13 showed mild heterogenous plaque- not hemodynam signif- velocities wnl, 0-39% bilat ICA stenoses; plan repeat 22yrs... ~  1/14:  She denies cerebral ischemic symptoms & appears stable on ASA 81mg /d...  VENOUS INSUFFICIENCY (ICD-459.81) - on low sodium diet, +elevation, +support hose, & Dyazide...  HYPERCHOLESTEROLEMIA (ICD-272.0) - on SIMVASTATIN 40mg /d + diet efforts... ~  FLP 7/08 showed TChol 156, TG 118, HDL 55, LDL 78 ~  FLP 7/09 showed TChol 159, TG 142, HDL 48, LDL 83... rec- continue same. ~  FLP 7/10 showed TChol 151, TG 81, HDL 59, LDL 76... continue Simva40. ~  FLP 1/11 showed TChol 155, TG 148, HDL 48, LDL 77 ~  FLP 7/11 showed TChol 147, TG 103, HDL 56, LDL 71... continue Simva40. ~  FLP 1/12 on  Simva40 showed TChol 162, TG 110, HDL 60, LDL 80 ~  FLP 1/13 on Simva40 showed TChol 156,  TG 92, HDL 60, LDL 78 ~  FLP 1/14 on Simva40 showed TChol 168, TG 154, HDL 57, LDL 80  HYPOTHYROIDISM (ICD-244.9) - on LEVOXYL 54mcg/d> s/p R thyroid lobectomy 2002 by DrCrossley for cyst...  ~  labs 1/09 on Levoxyl50 showed TSH= 3.10 ~  labs 7/09 on Levoxyl50 showed TSH= 2.34 ~  labs 1/10 showed TSH= 3.78... continue LEVOXYL50 ~  labs 7/10 showed TSH= 2.54 ~  labs 1/11 showed TSH= 2.95 ~  labs 7/11 showed TSH= 2.64 ~  labs 1/12 on Levo50 showed TSH= 3.59 ~  Labs 1/13 on Levo50 showed TSH= 4.95 ~  Labs 10/13 showed TSH= 4.37 & she is clinically euthyroid...  OBESITY (ICD-278.00) -  she knows that she needs to diet, exercise and get her weight down! ~  weight 1/10 = 193#,  she is 4\' 10"  tall,  BMI= 40... ~  weight 7/10 = 194# ~  weight 1/11 = 188# ~  weight 7/11 = 191# ~  weight 1/12 = 186# ~  Weight 7/12 = 187# ~  Weight 1/13 = 186# ~  Weight 7/13 = 189# ~  Weight 10/13 = 179#... Good job w/ wt reduction! ~  Weight 1/14 = 193#... What happened? ~  Weight 7/14 = 188#... "I'm not dieting, I can't afford weight watchers"  GERD (ICD-530.81) - she uses OTC Prilosec 20mg  Prn... she saw DrGessner in Feb09- doing satis... ~  [facility] records indicate she had an ENT eval by DrCrossley 5/12 w/ Ba swallow but we don't have his notes or the test results...  IRRITABLE BOWEL SYNDROME (ICD-564.1) &  COLONIC POLYPS (ICD-211.3) - colonoscopy 3/06 by DrSamLeBauer showed extremely tortuous colon, divertics, sm polyp... f/u 87yrs... ~  colonoscopy 3/11 by DrGessner showed divertics, stenosis in sigmoid, hems, & 2 sm polyps= adenoma fragments, f/u 16yrs. ~  She saw TP 11/13 for a gastroenteritis that resolved on it's own...  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  ~  labs 1/09 showed BUN= 15, Creat= 1.0 ~  labs 1/10 showed BUN= 18, Creat= 1.0 ~  labs 1/11 showed BUN= 16, Creat= 1.0 ~  labs 1/12 showed BUN= 19, Creat= 0.9  ~  8/12: she had f/u DrWebb> his note is reviewed, stable, no changes made. ~  Labs 1/13 showed BUN= 17, Creat= 1.0 ~  8/13: she continues to follow up w/ Nephrology, DrWebb (once per yr); BP controlled on meds, renal function normal, no changes made. ~  Labs 10/13 showed BUN= 15, Creat= 1.1  BENIGN NEOPLASM OF KIDNEY (ICD-223.0) - followed by DrBorden for ANGIOMYOLIPOMA (7mm right renal lesion- AML confirmed by MRI & followed by ultrasound exams) & renal cysts... she is seen every 432mo for sonars due to her severe anxiety about these benign lesions... ~  3/11: f/u DrBorden for 432mo sonar to eval angiomyolipoma right kidney- no change, reassured...  ~  11/11: f/u DrBorden w/ another sonar= no change AML or cyst;  ~  5/12:  routine 432mo f/u DrBorden w/ sonar> no change in 7mm AML right kidney, bilat renal cysts, sm right kidney... ~  11/12: f/u DrBorden for atrophic left kidney & 0.7cm AML right kidney; Sonar unchanged & she persists in wanting scans every 432mo. ~  5/13:  F/u DrBorden w/ atrophic  left kidney & 0.7cm angiomyolipoma right kid; she requests sonar Q98m due to her extreme anxiety & need for constant reassurance- no ch on sonar. ~  11/13:  She was rechecked by DrBorden> sonar unchanged & she is reassured... ~  5/14: she continues to f/u DrBorden Q77mo due to her anxiety over her atrophic left kidney and her 7mm angiomyolipoma of her right kidney; urine was clear 7 sonar unchanged...  DEGENERATIVE JOINT DISEASE (ICD-715.90) - she sees DrHilts for Ortho... LOW BACK PAIN, CHRONIC (ICD-724.2) - she tells me she is seeing a chiropractor, DrGibson, for "spinal decompression" & improved ~  She had f/u BMD  3/12 at [facility] w/ TScore -0.7 in FemNeck> normal... ~  Lumbar spine MRI 11/13 by DrHilts> mild multifactorial spinal stenosis L2-3, mod multifac sp stenosis L3-4 w/ left foraminal stenosis, mod multifac sp stenosis L4-5 w/ right foraminal stenosis. ~  BMD done at [facility] 3/14 showed lowest measured TScore of -0.2 in right FemNeck... ~  7/14: she reports that she continues to see her Chiro- drGibson Q6wks "I'm on maintenance"  GOUT (ICD-274.9) - Uric Acid Jul09 was 8.1 and ALLOPURINOL 100mg /d started by DrWebb... ~  labs 7/10 showed Uric= 5.8 ~  labs 1/11 showed Uric= 5.7 ~  labs 1/12 showed Uric= 6.1  ANXIETY (ICD-300.00) - she has severe anxiety about her health status, doesn't want nerve pills. ~  10/13:  Episode of choking, couldn't get air "in", & running down the hall pounding her chest after a shower; Eval was neg- and rec to take [facility] 0.5mg  1/2-1 Bid. ~  She reports that the Klonopin caused "crazy dreams" so she stopped it 7 this went away...  Health Maintenance - her GYN is ?(NP Maurice March)... BMD at [facility] done 2/ 10 & improved, on calcium, vits, etc... ~  labs 7/09 showed Vit D level = 57 ~  labs 7/10 showed Vit D level = 46 ~  labs 7/11 showed Vit D level = 68 ~  Labs 1/13 showed Vit D level = 57   Past Surgical History  Procedure Laterality Date  . Hysterctomy and ooperectomy  1970's  . Thyroid lobectomy for a cyst  10/2000    Dr. Haroldine Laws  . Functional endoscopic sinus surgery  05/2003    Dr. Haroldine Laws    Outpatient Encounter Prescriptions as of 09/18/2012  Medication Sig Dispense Refill  . allopurinol (ZYLOPRIM) 100 MG tablet TAKE ONE (1) TABLET EACH DAY  30 tablet  11  . aspirin 81 MG tablet Take 81 mg by mouth daily.        Marland Kitchen atenolol (TENORMIN) 100 MG tablet TAKE 1 TABLET DAILY  90 tablet  0  . Calcium Carbonate-Vitamin D (CALTRATE 600+D) 600-400 MG-UNIT per tablet Take 1 tablet by mouth 2 (two) times daily.        . dorzolamide-timolol (COSOPT) 22.3-6.8 MG/ML ophthalmic  solution Place 1 drop into both eyes 2 (two) times daily.      . Guaifenesin (MUCINEX MAXIMUM STRENGTH) 1200 MG TB12 Take 1 tablet by mouth as needed.       . latanoprost (XALATAN) 0.005 % ophthalmic solution Place 1 drop into the right eye at bedtime.        Marland Kitchen levothyroxine (SYNTHROID, LEVOTHROID) 50 MCG tablet TAKE ONE (1) TABLET EACH DAY  30 tablet  6  . LOTREL 5-10 MG per capsule TAKE 1 CAPSULE DAILY  90 capsule  1  . Multiple Vitamins-Minerals (CENTRUM SILVER PO) Take 1 tablet by mouth daily.        Marland Kitchen  potassium chloride (MICRO-K) 10 MEQ CR capsule TAKE TWO CAPSULES EVERY DAY  60 capsule  11  . simvastatin (ZOCOR) 40 MG tablet TAKE 1 TABLET DAILY  90 tablet  0  . triamterene-hydrochlorothiazide (DYAZIDE) 37.5-25 MG per capsule Take 1 each (1 capsule total) by mouth daily.  90 capsule  1   No facility-administered encounter medications on file as of 09/18/2012.    Allergies  Allergen Reactions  . Amlodipine Besylate   . Clonidine     Sweating, nausea, felt faint  . Diltiazem Hcl   . Doxycycline Hyclate     REACTION:  tetracycline  . Iodine     IVP contrast  . Labetalol Hcl   . Olmesartan Medoxomil   . Penicillins     Pt not allergic--just causes yeast infections    Current Medications, Allergies, Past Medical History, Past Surgical History, Family History, and Social History were reviewed in [employer] record.   Review of Systems        See HPI - all other systems neg except as noted...  The patient complains of dyspnea on exertion.  The patient denies anorexia, fever, weight loss, weight gain, vision loss, decreased hearing, hoarseness, chest pain, syncope, peripheral edema, prolonged cough, headaches, hemoptysis, abdominal pain, melena, hematochezia, severe indigestion/heartburn, hematuria, incontinence, muscle weakness, suspicious skin lesions, transient blindness, difficulty walking, depression, unusual weight change, abnormal bleeding, enlarged lymph  nodes, and angioedema.     Objective:   Physical Exam     WD, Obese, 80 y/o BF in NAD... GENERAL:  Alert & oriented; pleasant & cooperative... HEENT:  [place]/AT, EOM-wnl, PERRLA, EACs-clear, TMs-wnl, NOSE-clear, THROAT-clear & wnl. NECK:  Supple w/ fairROM; no JVD; normal carotid impulses w/o bruits; Thyroid surg scar, no thyromegaly or nodules palpated; no lymphadenopathy. CHEST:  Clear to P & A; without wheezes/ rales/ or rhonchi heard... HEART:  Regular Rhythm; without murmurs/ rubs/ or gallops detected... ABDOMEN:  Obese soft & nontender; normal bowel sounds; no organomegaly or masses palpated... EXT: without deformities, mild arthritic changes; no varicose veins/ venous insuffic/ tr edema. NEURO:  CN's intact; no focal neuro deficits... DERM:  No lesions noted; no rash etc...  RADIOLOGY DATA:  Reviewed in the EPIC EMR & discussed w/ the patient...    >>CXR 1/13 showed borderline heart size, clear lungs, TSpine w/ mild spondylosis...  LABORATORY DATA:  Reviewed in the EPIC EMR & discussed w/ the patient...    >>LABS 1/13 revealed FLP-ok, Chems-ok, CBC-ok, TSH-ok, VitD-57...   Assessment & Plan:    ?Cricopharyngeus dysfunction, choking, dyspnea, anxiety>  Eval is neg as above & tried Klonopin but she stopped due to "crazy dreams"...  OSA>  Followed by DrClance yearly; not wearing CPAP compliantly, no mask issues except when "congested", no daytime hypersomnolence etc...  HBP>  Controlled on meds as listed, continue same...  Hx AtypicCP & mild cerebrovasc plaque>  On ASA w/o ischemic symptoms; CDopplers are neg...  CHOL>  Stable on diet + Simva40, needs to lose weight!  Hypothyroid>  On Levoxyl 9mcg/d, continue same...  GI>  GERD, IBS, Colon Polyps>  Followed by DrGessner, stable on OTC Prilosec but doesn't want antispasmotic etc...  GU>  Atrophic right kidney, sm renal cysts, left angiomyolipoma, normal renal function> Followed by DrBorden every 70mo & also sees DrWebb  periodically as well...  DJD/ GOUT>  Stable w/o acute gout attacks...  Anxiety>  She still declines anxiolytic Rx, states she was ultimately intol to the Klonopin as well...   Patient's Medications  New Prescriptions   No medications on file  Previous Medications   ALLOPURINOL (ZYLOPRIM) 100 MG TABLET    TAKE ONE (1) TABLET EACH DAY   ASPIRIN 81 MG TABLET    Take 81 mg by mouth daily.     ATENOLOL (TENORMIN) 100 MG TABLET    TAKE 1 TABLET DAILY   CALCIUM CARBONATE-VITAMIN D (CALTRATE 600+D) 600-400 MG-UNIT PER TABLET    Take 1 tablet by mouth 2 (two) times daily.     DORZOLAMIDE-TIMOLOL (COSOPT) 22.3-6.8 MG/ML OPHTHALMIC SOLUTION    Place 1 drop into both eyes 2 (two) times daily.   GUAIFENESIN (MUCINEX MAXIMUM STRENGTH) 1200 MG TB12    Take 1 tablet by mouth as needed.    LATANOPROST (XALATAN) 0.005 % OPHTHALMIC SOLUTION    Place 1 drop into the right eye at bedtime.     LEVOTHYROXINE (SYNTHROID, LEVOTHROID) 50 MCG TABLET    TAKE ONE (1) TABLET EACH DAY   LOTREL 5-10 MG PER CAPSULE    TAKE 1 CAPSULE DAILY   MULTIPLE VITAMINS-MINERALS (CENTRUM SILVER PO)    Take 1 tablet by mouth daily.     POTASSIUM CHLORIDE (MICRO-K) 10 MEQ CR CAPSULE    TAKE TWO CAPSULES EVERY DAY   SIMVASTATIN (ZOCOR) 40 MG TABLET    TAKE 1 TABLET DAILY   TRIAMTERENE-HYDROCHLOROTHIAZIDE (DYAZIDE) 37.5-25 MG PER CAPSULE    Take 1 each (1 capsule total) by mouth daily.  Modified Medications   No medications on file  Discontinued Medications   No medications on file

## 2012-11-06 ENCOUNTER — Ambulatory Visit (HOSPITAL_COMMUNITY)
Admission: RE | Admit: 2012-11-06 | Discharge: 2012-11-06 | Disposition: A | Discharge status: Home / Self Care | Payer: Medicare Other | Source: Ambulatory Visit | Attending: Nephrology | Admitting: Nephrology

## 2012-11-06 ENCOUNTER — Other Ambulatory Visit (HOSPITAL_COMMUNITY): Payer: Self-pay | Admitting: Nephrology

## 2012-11-06 DIAGNOSIS — M7989 Other specified soft tissue disorders: Secondary | ICD-10-CM | POA: Insufficient documentation

## 2012-11-11 ENCOUNTER — Other Ambulatory Visit: Payer: Self-pay | Admitting: Pulmonary Disease

## 2012-11-26 ENCOUNTER — Ambulatory Visit (INDEPENDENT_AMBULATORY_CARE_PROVIDER_SITE_OTHER): Payer: Medicare Other

## 2012-11-26 DIAGNOSIS — Z23 Encounter for immunization: Secondary | ICD-10-CM

## 2013-03-03 ENCOUNTER — Telehealth: Payer: Self-pay | Admitting: Pulmonary Disease

## 2013-03-03 MED ORDER — AZITHROMYCIN 250 MG PO TABS: ORAL_TABLET | ORAL | Status: DC

## 2013-03-03 NOTE — Telephone Encounter (Signed)
Per TP: okay for Zpak #1 take as directed, mucinex dm 600mg  1-2 twice daily as needed for cough/congestion, plenty of fluids and rest.  ER or ov if symptoms do not improve or they worsen.  Rx sent to pharmacy on file Please call patient, thanks.

## 2013-03-03 NOTE — Telephone Encounter (Signed)
Spoke with pt. She c/o right ear pain, nasal congestion, sore throat, prod cough-yellow phlem, PND. She reports she has to cough hard to get mucus up.  All going on x 1 week. No wheezing, no chest tx, no increase SOB.  Has not taking anything OTC d/t her medical problems. Please advise TP thanks  Allergies  Allergen Reactions  . Amlodipine Besylate     Causes swelling  . Clonidine     Sweating, nausea, felt faint  . Diltiazem Hcl     meds did not work for her----causes swelling  . Doxycycline Hyclate     REACTION:  tetracycline  . Iodine     IVP contrast  . Labetalol Hcl   . Olmesartan Medoxomil   . Penicillins     Pt not allergic--just causes yeast infections

## 2013-03-03 NOTE — Telephone Encounter (Signed)
Pt aware of recs.  Nothing further needed. 

## 2013-03-09 ENCOUNTER — Emergency Department (HOSPITAL_COMMUNITY)
Admission: EM | Admit: 2013-03-09 | Discharge: 2013-03-09 | Disposition: A | Discharge status: Hospice | Payer: Medicare Other | Source: Home / Self Care | Attending: Family Medicine | Admitting: Family Medicine

## 2013-03-09 ENCOUNTER — Emergency Department (HOSPITAL_COMMUNITY): Payer: Medicare Other

## 2013-03-09 ENCOUNTER — Encounter (HOSPITAL_COMMUNITY): Payer: Self-pay | Admitting: Emergency Medicine

## 2013-03-09 ENCOUNTER — Inpatient Hospital Stay (HOSPITAL_COMMUNITY)
Admission: EM | Admit: 2013-03-09 | Discharge: 2013-03-13 | DRG: 872 | Disposition: A | Discharge status: Home / Self Care | Payer: Medicare Other | Attending: Family Medicine | Admitting: Family Medicine

## 2013-03-09 DIAGNOSIS — L0291 Cutaneous abscess, unspecified: Secondary | ICD-10-CM

## 2013-03-09 DIAGNOSIS — H409 Unspecified glaucoma: Secondary | ICD-10-CM

## 2013-03-09 DIAGNOSIS — E7800 Pure hypercholesterolemia, unspecified: Secondary | ICD-10-CM | POA: Diagnosis present

## 2013-03-09 DIAGNOSIS — R0789 Other chest pain: Secondary | ICD-10-CM

## 2013-03-09 DIAGNOSIS — L0390 Cellulitis, unspecified: Secondary | ICD-10-CM

## 2013-03-09 DIAGNOSIS — I679 Cerebrovascular disease, unspecified: Secondary | ICD-10-CM

## 2013-03-09 DIAGNOSIS — M79604 Pain in right leg: Secondary | ICD-10-CM

## 2013-03-09 DIAGNOSIS — D3000 Benign neoplasm of unspecified kidney: Secondary | ICD-10-CM

## 2013-03-09 DIAGNOSIS — N259 Disorder resulting from impaired renal tubular function, unspecified: Secondary | ICD-10-CM

## 2013-03-09 DIAGNOSIS — G8929 Other chronic pain: Secondary | ICD-10-CM | POA: Diagnosis present

## 2013-03-09 DIAGNOSIS — K219 Gastro-esophageal reflux disease without esophagitis: Secondary | ICD-10-CM | POA: Diagnosis present

## 2013-03-09 DIAGNOSIS — M79609 Pain in unspecified limb: Secondary | ICD-10-CM

## 2013-03-09 DIAGNOSIS — M1990 Unspecified osteoarthritis, unspecified site: Secondary | ICD-10-CM

## 2013-03-09 DIAGNOSIS — M79605 Pain in left leg: Secondary | ICD-10-CM

## 2013-03-09 DIAGNOSIS — I10 Essential (primary) hypertension: Secondary | ICD-10-CM | POA: Diagnosis present

## 2013-03-09 DIAGNOSIS — M545 Low back pain, unspecified: Secondary | ICD-10-CM | POA: Diagnosis present

## 2013-03-09 DIAGNOSIS — A419 Sepsis, unspecified organism: Principal | ICD-10-CM | POA: Diagnosis present

## 2013-03-09 DIAGNOSIS — L02419 Cutaneous abscess of limb, unspecified: Secondary | ICD-10-CM | POA: Diagnosis present

## 2013-03-09 DIAGNOSIS — J309 Allergic rhinitis, unspecified: Secondary | ICD-10-CM

## 2013-03-09 DIAGNOSIS — L03119 Cellulitis of unspecified part of limb: Secondary | ICD-10-CM | POA: Diagnosis present

## 2013-03-09 DIAGNOSIS — E669 Obesity, unspecified: Secondary | ICD-10-CM | POA: Diagnosis present

## 2013-03-09 DIAGNOSIS — M7989 Other specified soft tissue disorders: Secondary | ICD-10-CM

## 2013-03-09 DIAGNOSIS — E039 Hypothyroidism, unspecified: Secondary | ICD-10-CM | POA: Diagnosis present

## 2013-03-09 DIAGNOSIS — F411 Generalized anxiety disorder: Secondary | ICD-10-CM

## 2013-03-09 DIAGNOSIS — I872 Venous insufficiency (chronic) (peripheral): Secondary | ICD-10-CM | POA: Diagnosis present

## 2013-03-09 DIAGNOSIS — Z79899 Other long term (current) drug therapy: Secondary | ICD-10-CM

## 2013-03-09 DIAGNOSIS — J329 Chronic sinusitis, unspecified: Secondary | ICD-10-CM | POA: Diagnosis present

## 2013-03-09 DIAGNOSIS — M109 Gout, unspecified: Secondary | ICD-10-CM

## 2013-03-09 DIAGNOSIS — G4733 Obstructive sleep apnea (adult) (pediatric): Secondary | ICD-10-CM | POA: Diagnosis present

## 2013-03-09 DIAGNOSIS — Z6837 Body mass index (BMI) 37.0-37.9, adult: Secondary | ICD-10-CM

## 2013-03-09 DIAGNOSIS — D126 Benign neoplasm of colon, unspecified: Secondary | ICD-10-CM

## 2013-03-09 DIAGNOSIS — K589 Irritable bowel syndrome without diarrhea: Secondary | ICD-10-CM | POA: Diagnosis present

## 2013-03-09 DIAGNOSIS — J069 Acute upper respiratory infection, unspecified: Secondary | ICD-10-CM | POA: Diagnosis present

## 2013-03-09 DIAGNOSIS — R6510 Systemic inflammatory response syndrome (SIRS) of non-infectious origin without acute organ dysfunction: Secondary | ICD-10-CM

## 2013-03-09 LAB — CBC WITH DIFFERENTIAL/PLATELET
BASOS ABS: 0 10*3/uL (ref 0.0–0.1)
Basophils Relative: 0 % (ref 0–1)
EOS PCT: 0 % (ref 0–5)
Eosinophils Absolute: 0.1 10*3/uL (ref 0.0–0.7)
HEMATOCRIT: 36.2 % (ref 36.0–46.0)
Hemoglobin: 12.3 g/dL (ref 12.0–15.0)
LYMPHS ABS: 3.5 10*3/uL (ref 0.7–4.0)
LYMPHS PCT: 17 % (ref 12–46)
MCH: 29.8 pg (ref 26.0–34.0)
MCHC: 34 g/dL (ref 30.0–36.0)
MCV: 87.7 fL (ref 78.0–100.0)
MONO ABS: 1.5 10*3/uL — AB (ref 0.1–1.0)
MONOS PCT: 7 % (ref 3–12)
Neutro Abs: 15.7 10*3/uL — ABNORMAL HIGH (ref 1.7–7.7)
Neutrophils Relative %: 75 % (ref 43–77)
Platelets: 368 10*3/uL (ref 150–400)
RBC: 4.13 MIL/uL (ref 3.87–5.11)
RDW: 13.9 % (ref 11.5–15.5)
WBC: 20.8 10*3/uL — AB (ref 4.0–10.5)

## 2013-03-09 LAB — URIC ACID: Uric Acid, Serum: 6.5 mg/dL (ref 2.4–7.0)

## 2013-03-09 LAB — POCT I-STAT, CHEM 8
BUN: 14 mg/dL (ref 6–23)
CHLORIDE: 96 meq/L (ref 96–112)
CREATININE: 1 mg/dL (ref 0.50–1.10)
Calcium, Ion: 1.21 mmol/L (ref 1.13–1.30)
GLUCOSE: 90 mg/dL (ref 70–99)
HCT: 40 % (ref 36.0–46.0)
Hemoglobin: 13.6 g/dL (ref 12.0–15.0)
POTASSIUM: 3.9 meq/L (ref 3.7–5.3)
Sodium: 136 mEq/L — ABNORMAL LOW (ref 137–147)
TCO2: 28 mmol/L (ref 0–100)

## 2013-03-09 LAB — PRO B NATRIURETIC PEPTIDE: Pro B Natriuretic peptide (BNP): 80.5 pg/mL (ref 0–450)

## 2013-03-09 LAB — D-DIMER, QUANTITATIVE: D-Dimer, Quant: 1.68 ug/mL-FEU — ABNORMAL HIGH (ref 0.00–0.48)

## 2013-03-09 MED ORDER — LATANOPROST 0.005 % OP SOLN
1.0000 [drp] | Freq: Every day | OPHTHALMIC | Status: DC
  Administered 2013-03-09 – 2013-03-12 (×4): 1 [drp] via OPHTHALMIC
  Filled 2013-03-09: qty 2.5

## 2013-03-09 MED ORDER — ACETAMINOPHEN 325 MG PO TABS: 650.0000 mg | ORAL_TABLET | Freq: Four times a day (QID) | ORAL | Status: DC | PRN

## 2013-03-09 MED ORDER — CALCIUM CARBONATE-VITAMIN D 600-400 MG-UNIT PO TABS: 1.0000 | ORAL_TABLET | Freq: Two times a day (BID) | ORAL | Status: DC

## 2013-03-09 MED ORDER — AMLODIPINE BESY-BENAZEPRIL HCL 5-10 MG PO CAPS: 1.0000 | ORAL_CAPSULE | Freq: Every day | ORAL | Status: DC

## 2013-03-09 MED ORDER — VANCOMYCIN HCL 10 G IV SOLR
1500.0000 mg | INTRAVENOUS | Status: DC
  Administered 2013-03-10 – 2013-03-12 (×3): 1500 mg via INTRAVENOUS
  Filled 2013-03-09 (×4): qty 1500

## 2013-03-09 MED ORDER — LEVOFLOXACIN IN D5W 750 MG/150ML IV SOLN
750.0000 mg | Freq: Once | INTRAVENOUS | Status: AC
  Administered 2013-03-09: 750 mg via INTRAVENOUS
  Filled 2013-03-09: qty 150

## 2013-03-09 MED ORDER — ASPIRIN 81 MG PO TABS: 81.0000 mg | ORAL_TABLET | Freq: Every day | ORAL | Status: DC

## 2013-03-09 MED ORDER — POTASSIUM CHLORIDE CRYS ER 10 MEQ PO TBCR
10.0000 meq | EXTENDED_RELEASE_TABLET | Freq: Two times a day (BID) | ORAL | Status: DC
  Administered 2013-03-10 – 2013-03-13 (×7): 10 meq via ORAL
  Filled 2013-03-09 (×10): qty 1

## 2013-03-09 MED ORDER — LEVOFLOXACIN IN D5W 750 MG/150ML IV SOLN
750.0000 mg | INTRAVENOUS | Status: DC
  Administered 2013-03-11: 750 mg via INTRAVENOUS
  Filled 2013-03-09 (×2): qty 150

## 2013-03-09 MED ORDER — LEVOTHYROXINE SODIUM 50 MCG PO TABS
50.0000 ug | ORAL_TABLET | Freq: Every day | ORAL | Status: DC
  Administered 2013-03-10 – 2013-03-13 (×4): 50 ug via ORAL
  Filled 2013-03-09 (×5): qty 1

## 2013-03-09 MED ORDER — BENAZEPRIL HCL 10 MG PO TABS
10.0000 mg | ORAL_TABLET | Freq: Every day | ORAL | Status: DC
  Administered 2013-03-10: 10 mg via ORAL
  Filled 2013-03-09 (×5): qty 1

## 2013-03-09 MED ORDER — TRIAMTERENE-HCTZ 37.5-25 MG PO CAPS
1.0000 | ORAL_CAPSULE | Freq: Every day | ORAL | Status: DC
  Administered 2013-03-10 – 2013-03-13 (×4): 1 via ORAL
  Filled 2013-03-09 (×4): qty 1

## 2013-03-09 MED ORDER — GUAIFENESIN-DM 100-10 MG/5ML PO SYRP
5.0000 mL | ORAL_SOLUTION | ORAL | Status: DC | PRN
  Filled 2013-03-09: qty 5

## 2013-03-09 MED ORDER — DORZOLAMIDE HCL-TIMOLOL MAL 2-0.5 % OP SOLN
1.0000 [drp] | Freq: Two times a day (BID) | OPHTHALMIC | Status: DC
  Administered 2013-03-09 – 2013-03-13 (×8): 1 [drp] via OPHTHALMIC
  Filled 2013-03-09: qty 10

## 2013-03-09 MED ORDER — ATENOLOL 100 MG PO TABS
100.0000 mg | ORAL_TABLET | Freq: Every day | ORAL | Status: DC
  Administered 2013-03-10 – 2013-03-13 (×4): 100 mg via ORAL
  Filled 2013-03-09 (×4): qty 1

## 2013-03-09 MED ORDER — ASPIRIN EC 81 MG PO TBEC
81.0000 mg | DELAYED_RELEASE_TABLET | Freq: Every day | ORAL | Status: DC
  Administered 2013-03-10 – 2013-03-13 (×4): 81 mg via ORAL
  Filled 2013-03-09 (×5): qty 1

## 2013-03-09 MED ORDER — AMLODIPINE BESYLATE 5 MG PO TABS
5.0000 mg | ORAL_TABLET | Freq: Every day | ORAL | Status: DC
  Administered 2013-03-10: 5 mg via ORAL
  Filled 2013-03-09 (×5): qty 1

## 2013-03-09 MED ORDER — ACETAMINOPHEN 650 MG RE SUPP: 650.0000 mg | Freq: Four times a day (QID) | RECTAL | Status: DC | PRN

## 2013-03-09 MED ORDER — VANCOMYCIN HCL 10 G IV SOLR
1500.0000 mg | Freq: Once | INTRAVENOUS | Status: AC
  Administered 2013-03-09: 1500 mg via INTRAVENOUS
  Filled 2013-03-09: qty 1500

## 2013-03-09 MED ORDER — CALCIUM CARBONATE-VITAMIN D 500-200 MG-UNIT PO TABS
1.0000 | ORAL_TABLET | Freq: Two times a day (BID) | ORAL | Status: DC
  Administered 2013-03-10 – 2013-03-13 (×7): 1 via ORAL
  Filled 2013-03-09 (×9): qty 1

## 2013-03-09 MED ORDER — SIMVASTATIN 40 MG PO TABS
40.0000 mg | ORAL_TABLET | Freq: Every evening | ORAL | Status: DC
  Administered 2013-03-10: 40 mg via ORAL
  Filled 2013-03-09 (×3): qty 1

## 2013-03-09 MED ORDER — ALLOPURINOL 100 MG PO TABS
100.0000 mg | ORAL_TABLET | Freq: Every day | ORAL | Status: DC
  Administered 2013-03-10 – 2013-03-13 (×4): 100 mg via ORAL
  Filled 2013-03-09 (×5): qty 1

## 2013-03-09 MED ORDER — ENOXAPARIN SODIUM 40 MG/0.4ML ~~LOC~~ SOLN
40.0000 mg | Freq: Every day | SUBCUTANEOUS | Status: DC
  Administered 2013-03-10 – 2013-03-13 (×4): 40 mg via SUBCUTANEOUS
  Filled 2013-03-09 (×4): qty 0.4

## 2013-03-09 MED ORDER — HYDROCODONE-ACETAMINOPHEN 5-325 MG PO TABS: 1.0000 | ORAL_TABLET | Freq: Four times a day (QID) | ORAL | Status: DC | PRN

## 2013-03-09 NOTE — ED Provider Notes (Signed)
CSN: [id number]     Arrival date & time 03/09/13  1545 History   First MD Initiated Contact with Patient 03/09/13 1651     Chief Complaint  Patient presents with  . Leg Swelling  . URI   (Consider location/radiation/quality/duration/timing/severity/associated sxs/prior Treatment) HPI Comments: Patient presents emergency department with chief complaints of bilateral lower extremity swelling. She states the symptoms started in the right ankle on Wednesday, and have progressively worsened. She states that she also started noticing swelling in the left ankle. She states that the right ankle is red and painful. She denies any fevers, chills, nausea, vomiting, diarrhea, or constipation. She states that she was recently treated with azithromycin for nasal congestion and sinus pressure and cough. She denies any chest pain or shortness of breath. She was seen in urgent care earlier today, and was referred to the emergency department because of an elevated white blood cell count, and a positive d-dimer. She has not tried anything to alleviate her symptoms. She denies any falls or known mechanism of injury. She has not diabetic.  The history is provided by the patient. No language interpreter was used.    Past Medical History  Diagnosis Date  . Glaucoma   . OSA (obstructive sleep apnea)   . Hypertension   . Atypical chest pain   . Cerebrovascular disease   . Venous insufficiency   . Hypercholesteremia   . Hypothyroid   . Obesity   . IBS (irritable bowel syndrome)   . GERD (gastroesophageal reflux disease)   . Hx of colonic polyps   . Renal insufficiency   . Benign neoplasm of kidney, except pelvis   . DJD (degenerative joint disease)   . Chronic low back pain   . Gout   . Anxiety    Past Surgical History  Procedure Laterality Date  . Hysterctomy and ooperectomy  1970's  . Thyroid lobectomy for a cyst  10/2000    Dr. Ernesto Rutherford  . Functional endoscopic sinus surgery  05/2003    Dr. Ernesto Rutherford     No family history on file. History  Substance Use Topics  . Smoking status: Never Smoker   . Smokeless tobacco: Not on file  . Alcohol Use: No   OB History   Grav Para Term Preterm Abortions TAB SAB Ect Mult Living                 Review of Systems  All other systems reviewed and are negative.    Allergies  Amlodipine besylate; Clonidine; Diltiazem hcl; Doxycycline hyclate; Iodine; Labetalol hcl; Olmesartan medoxomil; and Penicillins  Home Medications   Current Outpatient Rx  Name  Route  Sig  Dispense  Refill  . allopurinol (ZYLOPRIM) 100 MG tablet      TAKE ONE (1) TABLET EACH DAY   30 tablet   11     PLEASE RESPOND   . aspirin 81 MG tablet   Oral   Take 81 mg by mouth daily.           Marland Kitchen atenolol (TENORMIN) 100 MG tablet      TAKE 1 TABLET DAILY   90 tablet   0   . azithromycin (ZITHROMAX) 250 MG tablet      Take as directed   6 each   0   . Calcium Carbonate-Vitamin D (CALTRATE 600+D) 600-400 MG-UNIT per tablet   Oral   Take 1 tablet by mouth 2 (two) times daily.           Marland Kitchen  dorzolamide-timolol (COSOPT) 22.3-6.8 MG/ML ophthalmic solution   Both Eyes   Place 1 drop into both eyes 2 (two) times daily.         . Guaifenesin (MUCINEX MAXIMUM STRENGTH) 1200 MG TB12   Oral   Take 1 tablet by mouth as needed.          . latanoprost (XALATAN) 0.005 % ophthalmic solution   Right Eye   Place 1 drop into the right eye at bedtime.           Marland Kitchen levothyroxine (SYNTHROID, LEVOTHROID) 50 MCG tablet      TAKE ONE (1) TABLET EACH DAY   30 tablet   6   . LOTREL 5-10 MG per capsule      TAKE 1 CAPSULE DAILY   90 capsule   1     Dispense as written.   . Multiple Vitamins-Minerals (CENTRUM SILVER PO)   Oral   Take 1 tablet by mouth daily.           . potassium chloride (MICRO-K) 10 MEQ CR capsule      TAKE TWO CAPSULES EVERY DAY   60 capsule   11   . simvastatin (ZOCOR) 40 MG tablet      TAKE 1 TABLET DAILY   90 tablet   0   .  triamterene-hydrochlorothiazide (DYAZIDE) 37.5-25 MG per capsule   Oral   Take 1 each (1 capsule total) by mouth daily.   90 capsule   1    BP 128/65  Pulse 77  Temp(Src) 97.8 F (36.6 C) (Oral)  Resp 18  SpO2 100% Physical Exam  Nursing note and vitals reviewed. Constitutional: She is oriented to person, place, and time. She appears well-developed and well-nourished.  HENT:  Head: Normocephalic and atraumatic.  Eyes: Conjunctivae and EOM are normal. Pupils are equal, round, and reactive to light.  Neck: Normal range of motion. Neck supple.  Cardiovascular: Normal rate and regular rhythm.  Exam reveals no gallop and no friction rub.   No murmur heard. Pulmonary/Chest: Effort normal and breath sounds normal. No respiratory distress. She has no wheezes. She has no rales. She exhibits no tenderness.  Abdominal: Soft. Bowel sounds are normal. She exhibits no distension and no mass. There is no tenderness. There is no rebound and no guarding.  Musculoskeletal: Normal range of motion. She exhibits no edema and no tenderness.       Feet:  Range of motion and strength in bilateral ankles are 5/5, however there is some pain, doubt septic joint, no calf tenderness, or unilateral calf swelling, doubt DVT  Neurological: She is alert and oriented to person, place, and time.  Skin: Skin is warm and dry. There is erythema.  Erythema over the lateral aspect of the right ankle as diagrammed, no fluctuance or purulence, no evidence of abscess  Psychiatric: She has a normal mood and affect. Her behavior is normal. Judgment and thought content normal.    ED Course  Procedures (including critical care time) Results for orders placed during the hospital encounter of 03/09/13  CBC WITH DIFFERENTIAL      Result Value Range   WBC 20.8 (*) 4.0 - 10.5 K/uL   RBC 4.13  3.87 - 5.11 MIL/uL   Hemoglobin 12.3  12.0 - 15.0 g/dL   HCT 36.2  36.0 - 46.0 %   MCV 87.7  78.0 - 100.0 fL   MCH 29.8  26.0 - 34.0  pg   MCHC 34.0  30.0 -  36.0 g/dL   RDW 13.9  11.5 - 15.5 %   Platelets 368  150 - 400 K/uL   Neutrophils Relative % 75  43 - 77 %   Neutro Abs 15.7 (*) 1.7 - 7.7 K/uL   Lymphocytes Relative 17  12 - 46 %   Lymphs Abs 3.5  0.7 - 4.0 K/uL   Monocytes Relative 7  3 - 12 %   Monocytes Absolute 1.5 (*) 0.1 - 1.0 K/uL   Eosinophils Relative 0  0 - 5 %   Eosinophils Absolute 0.1  0.0 - 0.7 K/uL   Basophils Relative 0  0 - 1 %   Basophils Absolute 0.0  0.0 - 0.1 K/uL  PRO B NATRIURETIC PEPTIDE      Result Value Range   Pro B Natriuretic peptide (BNP) 80.5  0 - 450 pg/mL  D-DIMER, QUANTITATIVE      Result Value Range   D-Dimer, Quant 1.68 (*) 0.00 - 0.48 ug/mL-FEU  POCT I-STAT, CHEM 8      Result Value Range   Sodium 136 (*) 137 - 147 mEq/L   Potassium 3.9  3.7 - 5.3 mEq/L   Chloride 96  96 - 112 mEq/L   BUN 14  6 - 23 mg/dL   Creatinine, Ser 1.00  0.50 - 1.10 mg/dL   Glucose, Bld 90  70 - 99 mg/dL   Calcium, Ion 1.21  1.13 - 1.30 mmol/L   TCO2 28  0 - 100 mmol/L   Hemoglobin 13.6  12.0 - 15.0 g/dL   HCT 40.0  36.0 - 46.0 %   Dg Ankle Complete Left  03/09/2013   CLINICAL DATA:  Leg swelling without injury  EXAM: LEFT ANKLE COMPLETE - 3+ VIEW  COMPARISON:  None.  FINDINGS: Generalized soft tissue swelling is seen. No acute fracture or dislocation is noted.  IMPRESSION: Soft tissue swelling without acute bony abnormality.   Electronically Signed   By: Inez Catalina M.D.   On: 03/09/2013 19:45   Dg Ankle Complete Right  03/09/2013   CLINICAL DATA:  Leg swelling  EXAM: RIGHT ANKLE - COMPLETE 3+ VIEW  COMPARISON:  None.  FINDINGS: Generalized soft tissue swelling is noted. No acute fracture or dislocation is seen.  IMPRESSION: Soft tissue swelling without acute bony abnormality.   Electronically Signed   By: Inez Catalina M.D.   On: 03/09/2013 19:45      EKG Interpretation   None       MDM   1. Cellulitis    Patient with cellulitis right lower remedy. Will start patient on  vancomycin. She has a white count of 20. No signs of DVT, no risk factors for DVT.  5:45 PM Patient seen by and discussed with Dr. Zenia Resides, who recommends admission.   Montine Circle, PA-C 03/09/13 2003

## 2013-03-09 NOTE — ED Notes (Signed)
IV team paged.  

## 2013-03-09 NOTE — ED Notes (Signed)
Pt's daughter, Joseph Art:  [phone].

## 2013-03-09 NOTE — ED Provider Notes (Signed)
CSN: [id number]     Arrival date & time 03/09/13  1202 History   First MD Initiated Contact with Patient 03/09/13 1358     Chief Complaint  Patient presents with  . Leg Swelling   (Consider location/radiation/quality/duration/timing/severity/associated sxs/prior Treatment) HPI Comments: 80 year old female presents complaining of bilateral lower extremity swelling and pain, worse on the right lower extremity. He also has nasal congestion and some upper respiratory symptoms. This is been ongoing for about 2 weeks. It started with nasal congestion, ear pain, and sinus pressure with a slight cough. She called her primary care doctor who called in a prescription for azithromycin. A few days ago, she started to have the leg pain and swelling. The right is worse than left, but she has the pain and swelling in both legs. She has never had this before. She has no history of congestive heart failure. She only has one kidney but apart from that has no significant renal dysfunction. She has no history of DVT or PE. No fever, chills, NVD. The wounds of the feet. She diabetes.   Past Medical History  Diagnosis Date  . Glaucoma   . OSA (obstructive sleep apnea)   . Hypertension   . Atypical chest pain   . Cerebrovascular disease   . Venous insufficiency   . Hypercholesteremia   . Hypothyroid   . Obesity   . IBS (irritable bowel syndrome)   . GERD (gastroesophageal reflux disease)   . Hx of colonic polyps   . Renal insufficiency   . Benign neoplasm of kidney, except pelvis   . DJD (degenerative joint disease)   . Chronic low back pain   . Gout   . Anxiety    Past Surgical History  Procedure Laterality Date  . Hysterctomy and ooperectomy  1970's  . Thyroid lobectomy for a cyst  10/2000    Dr. Ernesto Rutherford  . Functional endoscopic sinus surgery  05/2003    Dr. Ernesto Rutherford   History reviewed. No pertinent family history. History  Substance Use Topics  . Smoking status: Never Smoker   . Smokeless  tobacco: Not on file  . Alcohol Use: No   OB History   Grav Para Term Preterm Abortions TAB SAB Ect Mult Living                 Review of Systems  Constitutional: Negative for fever and chills.  HENT: Positive for congestion, ear pain and sinus pressure.   Eyes: Negative for visual disturbance.  Respiratory: Positive for cough. Negative for shortness of breath.   Cardiovascular: Positive for leg swelling. Negative for chest pain and palpitations.  Gastrointestinal: Negative for nausea, vomiting and abdominal pain.  Endocrine: Negative for polydipsia and polyuria.  Genitourinary: Negative for dysuria, urgency and frequency.  Musculoskeletal:       Leg pain  Skin: Negative for rash.  Neurological: Negative for dizziness, weakness and light-headedness.    Allergies  Amlodipine besylate; Clonidine; Diltiazem hcl; Doxycycline hyclate; Iodine; Labetalol hcl; Olmesartan medoxomil; and Penicillins  Home Medications   Current Outpatient Rx  Name  Route  Sig  Dispense  Refill  . allopurinol (ZYLOPRIM) 100 MG tablet      TAKE ONE (1) TABLET EACH DAY   30 tablet   11     PLEASE RESPOND   . aspirin 81 MG tablet   Oral   Take 81 mg by mouth daily.           Marland Kitchen atenolol (TENORMIN) 100 MG tablet  TAKE 1 TABLET DAILY   90 tablet   0   . azithromycin (ZITHROMAX) 250 MG tablet      Take as directed   6 each   0   . Calcium Carbonate-Vitamin D (CALTRATE 600+D) 600-400 MG-UNIT per tablet   Oral   Take 1 tablet by mouth 2 (two) times daily.           . dorzolamide-timolol (COSOPT) 22.3-6.8 MG/ML ophthalmic solution   Both Eyes   Place 1 drop into both eyes 2 (two) times daily.         . Guaifenesin (MUCINEX MAXIMUM STRENGTH) 1200 MG TB12   Oral   Take 1 tablet by mouth as needed.          . latanoprost (XALATAN) 0.005 % ophthalmic solution   Right Eye   Place 1 drop into the right eye at bedtime.           Marland Kitchen levothyroxine (SYNTHROID, LEVOTHROID) 50 MCG  tablet      TAKE ONE (1) TABLET EACH DAY   30 tablet   6   . LOTREL 5-10 MG per capsule      TAKE 1 CAPSULE DAILY   90 capsule   1     Dispense as written.   . Multiple Vitamins-Minerals (CENTRUM SILVER PO)   Oral   Take 1 tablet by mouth daily.           . potassium chloride (MICRO-K) 10 MEQ CR capsule      TAKE TWO CAPSULES EVERY DAY   60 capsule   11   . simvastatin (ZOCOR) 40 MG tablet      TAKE 1 TABLET DAILY   90 tablet   0   . triamterene-hydrochlorothiazide (DYAZIDE) 37.5-25 MG per capsule   Oral   Take 1 each (1 capsule total) by mouth daily.   90 capsule   1    BP 141/76  Pulse 76  Temp(Src) 100.5 F (38.1 C) (Oral)  Resp 16  SpO2 100% Physical Exam  Nursing note and vitals reviewed. Constitutional: She is oriented to person, place, and time. Vital signs are normal. She appears well-developed and well-nourished. No distress.  HENT:  Head: Normocephalic and atraumatic.  Pulmonary/Chest: Effort normal. No respiratory distress.  Musculoskeletal:       Feet:  Neurological: She is alert and oriented to person, place, and time. She has normal strength. Coordination normal.  Skin: Skin is warm and dry. No rash noted. She is not diaphoretic.  Psychiatric: She has a normal mood and affect. Judgment normal.    ED Course  Procedures (including critical care time) Labs Review Labs Reviewed  CBC WITH DIFFERENTIAL - Abnormal; Notable for the following:    WBC 20.8 (*)    Neutro Abs 15.7 (*)    Monocytes Absolute 1.5 (*)    All other components within normal limits  D-DIMER, QUANTITATIVE - Abnormal; Notable for the following:    D-Dimer, Quant 1.68 (*)    All other components within normal limits  POCT I-STAT, CHEM 8 - Abnormal; Notable for the following:    Sodium 136 (*)    All other components within normal limits  PRO B NATRIURETIC PEPTIDE   Imaging Review No results found.    MDM   1. Leg swelling   2. Leg pain, bilateral   3.  Cellulitis    White blood cell count came back significantly elevated. D-dimer is positive as well, although this is probably an  incidental finding. Due to her age and comorbidities, she would probably benefit from IV antibiotics to start. Transferred to the emergency department via shuttle       Liam Graham, PA-C 03/09/13 1526

## 2013-03-09 NOTE — H&P (Signed)
[facility] History and Physical  CAMONI CILIBERTO JXB:[id number] DOB: [birth year] DOA: 03/09/2013  Referring physician: ED PCP: Michele Mcalpine, MD   Chief Complaint:  Bilateral lower leg swelling and pain since 3-4 days  HPI:  80 year old female with multiple medical history as outlined below presented to the ED with symptoms of pain in bilateral lower legs and swelling for past 3-4 days. Patient reports having pain and swelling over the right lower leg near the ankle with erythema about 4 days back. This was followed by pain and swelling over the left lower leg the next day. Patient has difficulty ambulating because of the pain. She denies any fever or chills. She denies any trauma or recent travel. She denies similar symptoms in the past. She also reports having sinus congestion and ear ache for possible days for which her PCP had feeling prescription for Z-Pak yesterday. She denies any chills, headache, blurred vision, dizziness, nausea, vomiting, chest pain, palpitations, shortness of breath, abdominal pain, bowel or urinary symptoms. Denies recent history of gouty arthritis.  Course in the ED Patient was found to be febrile to 100.5 degree Fahrenheit. Remaining vitals were stable. Blood work done sordid cytosis with WBC of 20.8 thousand, chemistry was normal. Bilateral x-ray of the ankle showed generalized soft tissue swelling without any fracture or dislocation. With clinical findings of cellulitis of the bilateral leg patient was started on IV vancomycin and [facility] consulted for admission to medical floor.  Review of Systems:  Constitutional: Denies fever, chills, diaphoresis, appetite change and fatigue.  HEENT: nasal congestion, sinus tenderness, earache, Denies photophobia, eye pain, redness, , sore throat, rhinorrhea, sneezing, mouth sores, trouble swallowing, neck pain, neck stiffness and tinnitus.   Respiratory: Denies SOB, DOE, cough, chest tightness,  and wheezing.    Cardiovascular: Denies chest pain, palpitations and leg swelling.  Gastrointestinal: Denies nausea, vomiting, abdominal pain, diarrhea, constipation, blood in stool and abdominal distention.  Genitourinary: Denies dysuria, urgency, frequency, hematuria, flank pain and difficulty urinating.  Endocrine: Denies: hot or cold intolerance, sweats, changes in hair or nails, polyuria, polydipsia. Musculoskeletal: b/l lower leg and ankle pain and swelling ,reports painful gait. Denies myalgias, back pain,  Skin: Denies pallor, rash and wound.  Neurological: Denies dizziness, seizures, syncope, weakness, light-headedness, numbness and headaches.  Hematological: Denies adenopathy. Psychiatric/Behavioral: Denies  confusion, nervousness, sleep disturbance and agitation   Past Medical History  Diagnosis Date  . Glaucoma   . OSA (obstructive sleep apnea)   . Hypertension   . Atypical chest pain   . Cerebrovascular disease   . Venous insufficiency   . Hypercholesteremia   . Hypothyroid   . Obesity   . IBS (irritable bowel syndrome)   . GERD (gastroesophageal reflux disease)   . Hx of colonic polyps   . Renal insufficiency   . Benign neoplasm of kidney, except pelvis   . DJD (degenerative joint disease)   . Chronic low back pain   . Gout   . Anxiety    Past Surgical History  Procedure Laterality Date  . Hysterctomy and ooperectomy  1970's  . Thyroid lobectomy for a cyst  10/2000    Dr. Haroldine Laws  . Functional endoscopic sinus surgery  05/2003    Dr. Haroldine Laws   Social History:  reports that she has never smoked. She does not have any smokeless tobacco history on file. She reports that she does not drink alcohol or use illicit drugs.  Allergies  Allergen Reactions  . Amlodipine Besylate  Causes swelling  . Clonidine     Sweating, nausea, felt faint  . Diltiazem Hcl     meds did not work for her----causes swelling  . Doxycycline Hyclate     REACTION:  tetracycline  . Iodine     IVP  contrast  . Labetalol Hcl   . Olmesartan Medoxomil   . Penicillins     Pt not allergic--just causes yeast infections    No family history on file.  Prior to Admission medications   Medication Sig Start Date End Date Taking? Authorizing Provider  allopurinol (ZYLOPRIM) 100 MG tablet Take 100 mg by mouth daily.   Yes Historical Provider, MD  amLODipine-benazepril (LOTREL) 5-10 MG per capsule Take 1 capsule by mouth daily.   Yes Historical Provider, MD  aspirin 81 MG tablet Take 81 mg by mouth daily.     Yes Historical Provider, MD  atenolol (TENORMIN) 100 MG tablet Take 100 mg by mouth daily.   Yes Historical Provider, MD  azithromycin (ZITHROMAX) 250 MG tablet Take as directed 03/03/13  Yes Tammy S Parrett, NP  azithromycin (ZITHROMAX) 250 MG tablet Take 250-500 mg by mouth See admin instructions. 2 tabs on day one and 1 tab daily for 4 days.   Yes Historical Provider, MD  Calcium Carbonate-Vitamin D (CALTRATE 600+D) 600-400 MG-UNIT per tablet Take 1 tablet by mouth 2 (two) times daily.     Yes Historical Provider, MD  dorzolamide-timolol (COSOPT) 22.3-6.8 MG/ML ophthalmic solution Place 1 drop into both eyes 2 (two) times daily.   Yes Historical Provider, MD  Guaifenesin (MUCINEX MAXIMUM STRENGTH) 1200 MG TB12 Take 1 tablet by mouth as needed.    Yes Historical Provider, MD  latanoprost (XALATAN) 0.005 % ophthalmic solution Place 1 drop into the right eye at bedtime.     Yes Historical Provider, MD  levothyroxine (SYNTHROID, LEVOTHROID) 50 MCG tablet Take 50 mcg by mouth daily before breakfast.   Yes Historical Provider, MD  Multiple Vitamins-Minerals (CENTRUM SILVER PO) Take 1 tablet by mouth daily.     Yes Historical Provider, MD  potassium chloride (MICRO-K) 10 MEQ CR capsule Take 10 mEq by mouth 2 (two) times daily.   Yes Historical Provider, MD  simvastatin (ZOCOR) 40 MG tablet Take 40 mg by mouth every evening.   Yes Historical Provider, MD  triamterene-hydrochlorothiazide (DYAZIDE)  37.5-25 MG per capsule Take 1 each (1 capsule total) by mouth daily. 02/07/12  Yes Michele Mcalpine, MD    Physical Exam:  Filed Vitals:   03/09/13 1549 03/09/13 1751 03/09/13 1800  BP: 128/65  158/73  Pulse: 77  73  Temp: 97.8 F (36.6 C)    TempSrc: Oral    Resp: 18    Height:  4' 11.45" (1.51 m)   Weight:  85.1 kg (187 lb 9.8 oz)   SpO2: 100%  97%    Constitutional: Vital signs reviewed.  Patient is a well-developed and well-nourished in no acute distress and cooperative with exam.  HEENT: No pallor, no icterus, moist oral mucosa, no sinus tenderness  Cardiovascular: RRR, S1 normal, S2 normal, no MRG Pulmonary/Chest: CTAB, no wheezes, rales, or rhonchi Abdominal: Soft. Non-tender, non-distended, bowel sounds are normal,  Extremities: Increased warmth and swelling over bilateral lower leg (right greater than left). To palpation over bilateral lower leg just above the ankle. No calf tenderness or swelling. Normal ROM of ankle joints. Distal pulses palpable bilaterally Neurological: A&O x3, nonfocal  Labs on Admission:  Basic Metabolic Panel:  Recent Labs Lab  03/09/13 1428  NA 136*  K 3.9  CL 96  GLUCOSE 90  BUN 14  CREATININE 1.00   Liver Function Tests: No results found for this basename: AST, ALT, ALKPHOS, BILITOT, PROT, ALBUMIN,  in the last 168 hours No results found for this basename: LIPASE, AMYLASE,  in the last 168 hours No results found for this basename: AMMONIA,  in the last 168 hours CBC:  Recent Labs Lab 03/09/13 1423 03/09/13 1428  WBC 20.8*  --   NEUTROABS 15.7*  --   HGB 12.3 13.6  HCT 36.2 40.0  MCV 87.7  --   PLT 368  --    Cardiac Enzymes: No results found for this basename: CKTOTAL, CKMB, CKMBINDEX, TROPONINI,  in the last 168 hours BNP: No components found with this basename: POCBNP,  CBG: No results found for this basename: GLUCAP,  in the last 168 hours  Radiological Exams on Admission: Dg Ankle Complete Left  03/09/2013   CLINICAL  DATA:  Leg swelling without injury  EXAM: LEFT ANKLE COMPLETE - 3+ VIEW  COMPARISON:  None.  FINDINGS: Generalized soft tissue swelling is seen. No acute fracture or dislocation is noted.  IMPRESSION: Soft tissue swelling without acute bony abnormality.   Electronically Signed   By: Alcide Clever M.D.   On: 03/09/2013 19:45   Dg Ankle Complete Right  03/09/2013   CLINICAL DATA:  Leg swelling  EXAM: RIGHT ANKLE - COMPLETE 3+ VIEW  COMPARISON:  None.  FINDINGS: Generalized soft tissue swelling is noted. No acute fracture or dislocation is seen.  IMPRESSION: Soft tissue swelling without acute bony abnormality.   Electronically Signed   By: Alcide Clever M.D.   On: 03/09/2013 19:45      Assessment/Plan   Principal Problem:   Cellulitis of leg Patient has cellulitis involving bilateral lower leg. Given fever of 100.15F and WBC greater than 20,000 she does meet criteria for SIRS. We will admit her to medical floor. She received a dose of IV vancomycin in the ED which I would continue. Will check Doppler of lower extremities to rule out DVT. Check uric acid level. Will get physical therapy  Active Problems: URI symptoms with sinusitis and ear ache Patient just started on Z-Pak as outpatient. I will place her on levaquin. Check flu PCR   Hypothyroidism continue Synthroid  Hypertension Resume home medications  History of gout Continue allopurinol. Check uric acid level  DVT prophylaxis Subcutaneous Lovenox   Diet: Low-sodium  Code Status: Full code Family Communication: None at Bedside Disposition Plan: Home once stable  Estes Lehner [facility] Pager [phone]  If 7PM-7AM, please contact night-coverage [email or web address] Password [facility] 03/09/2013, 8:33 PM   Total time spent: 70 minutes

## 2013-03-09 NOTE — ED Notes (Signed)
Pt is transfer from urgent care with uri symptoms cough, cold and reports LE swelling- pulses present.  Sent here because of elevated WBC count 20.8 and positive d-dimer 1.68

## 2013-03-09 NOTE — ED Provider Notes (Signed)
Medical screening examination/treatment/procedure(s) were conducted as a shared visit with non-physician practitioner(s) and myself.  I personally evaluated the patient during the encounter.  EKG Interpretation   None      Patient here with right lower extremity swelling and erythema consistent with cellulitis. She had a fever and elevated white blood cell count. Be started on vancomycin and hospitalist will be consulted  Leota Jacobsen, MD 03/09/13 1751

## 2013-03-09 NOTE — ED Notes (Signed)
C/o bilateral leg swelling which started on Wednesday Admits to taking fluid pills States she did take tylenol and was wondering if she was having a reaction

## 2013-03-09 NOTE — Progress Notes (Signed)
ANTIBIOTIC CONSULT NOTE - INITIAL  Pharmacy Consult for Vancomycin Indication: Ankle cellulitis   Allergies  Allergen Reactions  . Amlodipine Besylate     Causes swelling  . Clonidine     Sweating, nausea, felt faint  . Diltiazem Hcl     meds did not work for her----causes swelling  . Doxycycline Hyclate     REACTION:  tetracycline  . Iodine     IVP contrast  . Labetalol Hcl   . Olmesartan Medoxomil   . Penicillins     Pt not allergic--just causes yeast infections    Patient Measurements:   Adjusted Body Weight: n/a  Vital Signs: Temp: 97.8 F (36.6 C) (01/11 1549) Temp src: Oral (01/11 1549) BP: 128/65 mmHg (01/11 1549) Pulse Rate: 77 (01/11 1549) Intake/Output from previous day:   Intake/Output from this shift:    Labs:  Recent Labs  03/09/13 1423 03/09/13 1428  WBC 20.8*  --   HGB 12.3 13.6  PLT 368  --   CREATININE  --  1.00   The CrCl is unknown because both a height and weight (above a minimum accepted value) are required for this calculation. No results found for this basename: VANCOTROUGH, VANCOPEAK, VANCORANDOM, GENTTROUGH, GENTPEAK, GENTRANDOM, TOBRATROUGH, TOBRAPEAK, TOBRARND, AMIKACINPEAK, AMIKACINTROU, AMIKACIN,  in the last 72 hours   Microbiology: No results found for this or any previous visit (from the past 720 hour(s)).  Medical History: Past Medical History  Diagnosis Date  . Glaucoma   . OSA (obstructive sleep apnea)   . Hypertension   . Atypical chest pain   . Cerebrovascular disease   . Venous insufficiency   . Hypercholesteremia   . Hypothyroid   . Obesity   . IBS (irritable bowel syndrome)   . GERD (gastroesophageal reflux disease)   . Hx of colonic polyps   . Renal insufficiency   . Benign neoplasm of kidney, except pelvis   . DJD (degenerative joint disease)   . Chronic low back pain   . Gout   . Anxiety     Medications:   (Not in a hospital admission) Assessment: 75 YOF with right lower extremity swelling and  erythema consistent with cellulitis for which she is being started on IV vancomycin. WBC elevated at 20.8. Afebrile, CrCl ~ 43 mL/min. No cx drawn yet.   Goal of Therapy:  Vancomycin trough level 10-15 mcg/ml  Plan:  1) Start Vancomycin 1500 mg IV Q 24 hours 2) F/u CBC, renal fx, cultures and patient clinical status 3) Order vanc troughs as necessary   April Donaldson, PharmD.  Clinical Pharmacist Pager [phone]

## 2013-03-10 DIAGNOSIS — M109 Gout, unspecified: Secondary | ICD-10-CM

## 2013-03-10 DIAGNOSIS — M79609 Pain in unspecified limb: Secondary | ICD-10-CM

## 2013-03-10 DIAGNOSIS — M7989 Other specified soft tissue disorders: Secondary | ICD-10-CM

## 2013-03-10 DIAGNOSIS — E039 Hypothyroidism, unspecified: Secondary | ICD-10-CM

## 2013-03-10 DIAGNOSIS — L02419 Cutaneous abscess of limb, unspecified: Secondary | ICD-10-CM

## 2013-03-10 DIAGNOSIS — L03119 Cellulitis of unspecified part of limb: Secondary | ICD-10-CM

## 2013-03-10 LAB — CBC
HCT: 33.7 % — ABNORMAL LOW (ref 36.0–46.0)
Hemoglobin: 11.7 g/dL — ABNORMAL LOW (ref 12.0–15.0)
MCH: 29.8 pg (ref 26.0–34.0)
MCHC: 34.7 g/dL (ref 30.0–36.0)
MCV: 85.8 fL (ref 78.0–100.0)
Platelets: 382 10*3/uL (ref 150–400)
RBC: 3.93 MIL/uL (ref 3.87–5.11)
RDW: 14 % (ref 11.5–15.5)
WBC: 19.4 10*3/uL — ABNORMAL HIGH (ref 4.0–10.5)

## 2013-03-10 LAB — INFLUENZA PANEL BY PCR (TYPE A & B)
H1N1 flu by pcr: NOT DETECTED
INFLBPCR: NEGATIVE
Influenza A By PCR: NEGATIVE

## 2013-03-10 NOTE — ED Provider Notes (Signed)
Medical screening examination/treatment/procedure(s) were performed by resident physician or non-physician practitioner and as supervising physician I was immediately available for consultation/collaboration.   Uchenna Rappaport DOUGLAS MD.   Breccan Galant D Del Overfelt, MD 03/10/13 1759 

## 2013-03-10 NOTE — Progress Notes (Signed)
Utilization review completed.  

## 2013-03-10 NOTE — Progress Notes (Signed)
[facility] PROGRESS NOTE  April Donaldson KYH:[id number] DOB: [birth year] DOA: 07-Apr-2013 PCP: Noralee Space, MD  Assessment/Plan: Cellulitis of legs  Patient has cellulitis involving bilateral lower leg. Given fever of 100.44F and WBC greater than 20,000 she does meet criteria for SIRS. Admitted to medical floor. Continue vancomycin. Doppler of lower extremities negative for DVT. uric acid level 6.5. Will get physical therapy. WBC trending down.   URI symptoms with sinusitis and ear ache  Patient just started on Z-Pak as outpatient. Continue levaquin. Flu PCR negative.    Hypothyroidism  continue Synthroid   Hypertension  Resume home medications   History of gout  Continue allopurinol. Check uric acid level   DVT prophylaxis  Subcutaneous Lovenox   Diet: Low-sodium   Code Status: Full code  Family Communication: None at Bedside  Disposition Plan: Home once stable  HPI/Subjective: Pt reports that there is much less swelling in her legs today   Objective: Filed Vitals:   03/10/13 0622  BP: 110/49  Pulse: 66  Temp: 98.2 F (36.8 C)  Resp: 18    Intake/Output Summary (Last 24 hours) at 03/10/13 1046 Last data filed at 03/10/13 1517  Gross per 24 hour  Intake    240 ml  Output      0 ml  Net    240 ml   Filed Weights   Apr 07, 2013 1751 03/10/13 0622  Weight: 187 lb 9.8 oz (85.1 kg) 188 lb 7.9 oz (85.5 kg)    Exam: Constitutional: Vital signs reviewed. Patient is a well-developed and well-nourished in no acute distress and cooperative with exam.  HEENT: No pallor, no icterus, moist oral mucosa, no sinus tenderness  Cardiovascular: RRR, S1 normal, S2 normal, no MRG  Pulmonary/Chest: CTAB, no wheezes, rales, or rhonchi  Abdominal: Soft. Non-tender, non-distended, bowel sounds are normal,  Extremities: Increased warmth and swelling over bilateral lower leg. Small areas of redness noted in LLE near ankle and warm to palpation. No calf tenderness or swelling.  Normal ROM of ankle joints. Distal pulses palpable bilaterally Neurological: A&O x3, nonfocal  Data Reviewed: Basic Metabolic Panel:  Recent Labs Lab 04/07/2013 1428  NA 136*  K 3.9  CL 96  GLUCOSE 90  BUN 14  CREATININE 1.00   Liver Function Tests: No results found for this basename: AST, ALT, ALKPHOS, BILITOT, PROT, ALBUMIN,  in the last 168 hours No results found for this basename: LIPASE, AMYLASE,  in the last 168 hours No results found for this basename: AMMONIA,  in the last 168 hours CBC:  Recent Labs Lab 2013/04/07 1423 04-07-2013 1428 03/10/13 0424  WBC 20.8*  --  19.4*  NEUTROABS 15.7*  --   --   HGB 12.3 13.6 11.7*  HCT 36.2 40.0 33.7*  MCV 87.7  --  85.8  PLT 368  --  382   Cardiac Enzymes: No results found for this basename: CKTOTAL, CKMB, CKMBINDEX, TROPONINI,  in the last 168 hours BNP (last 3 results)  Recent Labs  2013/04/07 1423  PROBNP 80.5   CBG: No results found for this basename: GLUCAP,  in the last 168 hours  No results found for this or any previous visit (from the past 240 hour(s)).   Studies: Dg Ankle Complete Left  Apr 07, 2013   CLINICAL DATA:  Leg swelling without injury  EXAM: LEFT ANKLE COMPLETE - 3+ VIEW  COMPARISON:  None.  FINDINGS: Generalized soft tissue swelling is seen. No acute fracture or dislocation is noted.  IMPRESSION: Soft tissue swelling  without acute bony abnormality.   Electronically Signed   By: Inez Catalina M.D.   On: 03/09/2013 19:45   Dg Ankle Complete Right  03/09/2013   CLINICAL DATA:  Leg swelling  EXAM: RIGHT ANKLE - COMPLETE 3+ VIEW  COMPARISON:  None.  FINDINGS: Generalized soft tissue swelling is noted. No acute fracture or dislocation is seen.  IMPRESSION: Soft tissue swelling without acute bony abnormality.   Electronically Signed   By: Inez Catalina M.D.   On: 03/09/2013 19:45    Scheduled Meds: . allopurinol  100 mg Oral Daily  . amLODipine  5 mg Oral Daily   And  . benazepril  10 mg Oral Daily  .  aspirin EC  81 mg Oral Daily  . atenolol  100 mg Oral Daily  . calcium-vitamin D  1 tablet Oral BID  . dorzolamide-timolol  1 drop Both Eyes BID  . enoxaparin (LOVENOX) injection  40 mg Subcutaneous Daily  . latanoprost  1 drop Right Eye QHS  . [START ON 03/11/2013] levofloxacin (LEVAQUIN) IV  750 mg Intravenous Q48H  . levothyroxine  50 mcg Oral QAC breakfast  . potassium chloride  10 mEq Oral BID  . simvastatin  40 mg Oral QPM  . triamterene-hydrochlorothiazide  1 capsule Oral Daily  . vancomycin  1,500 mg Intravenous Q24H   Continuous Infusions:   Principal Problem:   Cellulitis Active Problems:   HYPOTHYROIDISM   OBESITY   HYPERTENSION   GERD   SIRS (systemic inflammatory response syndrome)   Upper respiratory infection   Cellulitis of leg  [facility]  [facility] Pager [phone]. If 7PM-7AM, please contact night-coverage at [email or web address], password [email or web address] 03/10/2013, 10:46 AM  LOS: 1 day

## 2013-03-10 NOTE — Progress Notes (Signed)
VASCULAR LAB PRELIMINARY  PRELIMINARY  PRELIMINARY  PRELIMINARY  Bilateral lower extremity venous Dopplers completed.    Preliminary report:  There is no DVT or SVT noted in the bilateral lower extremities.  Azari Hasler, RVT 03/10/2013, 8:30 AM

## 2013-03-10 NOTE — ED Notes (Signed)
Opened chart to review lab on admitted patient

## 2013-03-10 NOTE — Evaluation (Signed)
Physical Therapy Evaluation Patient Details Name: April Donaldson MRN: [id number] DOB: [birth year] Today's Date: 03/10/2013 Time: 9381-8299 PT Time Calculation (min): 13 min  PT Assessment / Plan / Recommendation History of Present Illness  Bil LE cellulitis  Clinical Impression  Patient evaluated by Physical Therapy with no further acute PT needs identified. All education has been completed and the patient has no further questions.  . PT is signing off. Thank you for this referral.     PT Assessment  Patent does not need any further PT services    Follow Up Recommendations  No PT follow up    Does the patient have the potential to tolerate intense rehabilitation      Barriers to Discharge        Equipment Recommendations  None recommended by PT    Recommendations for Other Services     Frequency      Precautions / Restrictions Precautions Precautions: None   Pertinent Vitals/Pain no apparent distress       Mobility  Transfers Overall transfer level: Modified independent Ambulation/Gait Ambulation/Gait assistance: Supervision;Modified independent (Device/Increase time) Ambulation Distance (Feet): 200 Feet Assistive device: None Gait Pattern/deviations: WFL(Within Functional Limits);Wide base of support;Decreased stride length General Gait Details: a bit slow, but no loss of balance; progresed from supervision to modified independent Stairs: Yes Stairs assistance: Modified independent (Device/Increase time) Stair Management: One rail Right;Forwards Number of Stairs: 3 General stair comments: No difficulty    Exercises     PT Diagnosis:    PT Problem List:   PT Treatment Interventions:       PT Goals(Current goals can be found in the care plan section) Acute Rehab PT Goals Patient Stated Goal: Home PT Goal Formulation: No goals set, d/c therapy  Visit Information  Last PT Received On: 03/10/13 Assistance Needed: +1 History of Present Illness: Bil LE  cellulitis       Prior Functioning  Home Living Family/patient expects to be discharged to:: Private residence Living Arrangements: Alone Available Help at Discharge: Family;Available PRN/intermittently Type of Home: House Home Access: Stairs to enter [employer] of Steps: 3 Entrance Stairs-Rails: Right Home Layout: One level Home Equipment: None Prior Function Level of Independence: Independent Communication Communication: No difficulties    Cognition  Cognition Arousal/Alertness: Awake/alert Behavior During Therapy: WFL for tasks assessed/performed Overall Cognitive Status: Within Functional Limits for tasks assessed    Extremity/Trunk Assessment Upper Extremity Assessment Upper Extremity Assessment: Overall WFL for tasks assessed Lower Extremity Assessment Lower Extremity Assessment: Overall WFL for tasks assessed (Some RLE pain with sciatica)   Balance Balance Overall balance assessment: Modified Independent  End of Session PT - End of Session Activity Tolerance: Patient tolerated treatment well Patient left: in chair;with call bell/phone within reach Nurse Communication: Mobility status  GP     Roney Marion [facility] [place], [place]  03/10/2013, 12:38 PM

## 2013-03-11 LAB — BASIC METABOLIC PANEL
BUN: 19 mg/dL (ref 6–23)
CALCIUM: 8.8 mg/dL (ref 8.4–10.5)
CO2: 26 mEq/L (ref 19–32)
CREATININE: 1.01 mg/dL (ref 0.50–1.10)
Chloride: 100 mEq/L (ref 96–112)
GFR calc Af Amer: 60 mL/min — ABNORMAL LOW (ref >90)
GFR, EST NON AFRICAN AMERICAN: 52 mL/min — AB (ref >90)
Glucose, Bld: 91 mg/dL (ref 70–99)
Potassium: 3.6 mEq/L — ABNORMAL LOW (ref 3.7–5.3)
SODIUM: 139 meq/L (ref 137–147)

## 2013-03-11 LAB — CBC WITH DIFFERENTIAL/PLATELET
BASOS ABS: 0 10*3/uL (ref 0.0–0.1)
BASOS PCT: 0 % (ref 0–1)
EOS ABS: 0.1 10*3/uL (ref 0.0–0.7)
EOS PCT: 1 % (ref 0–5)
HCT: 30.6 % — ABNORMAL LOW (ref 36.0–46.0)
Hemoglobin: 10.4 g/dL — ABNORMAL LOW (ref 12.0–15.0)
Lymphocytes Relative: 24 % (ref 12–46)
Lymphs Abs: 3.4 10*3/uL (ref 0.7–4.0)
MCH: 29.1 pg (ref 26.0–34.0)
MCHC: 34 g/dL (ref 30.0–36.0)
MCV: 85.7 fL (ref 78.0–100.0)
Monocytes Absolute: 1.1 10*3/uL — ABNORMAL HIGH (ref 0.1–1.0)
Monocytes Relative: 8 % (ref 3–12)
Neutro Abs: 9.8 10*3/uL — ABNORMAL HIGH (ref 1.7–7.7)
Neutrophils Relative %: 68 % (ref 43–77)
PLATELETS: 348 10*3/uL (ref 150–400)
RBC: 3.57 MIL/uL — ABNORMAL LOW (ref 3.87–5.11)
RDW: 14.2 % (ref 11.5–15.5)
WBC: 14.5 10*3/uL — AB (ref 4.0–10.5)

## 2013-03-11 MED ORDER — ATORVASTATIN CALCIUM 20 MG PO TABS
20.0000 mg | ORAL_TABLET | Freq: Every day | ORAL | Status: DC
  Filled 2013-03-11 (×3): qty 1

## 2013-03-11 NOTE — Progress Notes (Signed)
[facility] PROGRESS NOTE  April Donaldson NWG:[id number] DOB: 1931-08-31 DOA: 2013/03/11 PCP: Michele Mcalpine, MD  Assessment/Plan: Cellulitis of legs  Patient has cellulitis involving bilateral lower leg. Given fever of 100.7F and WBC greater than 20,000 she does meet criteria for SIRS. Admitted to medical floor. Continue vancomycin. Doppler of lower extremities negative for DVT. uric acid level 6.5. Will get physical therapy. WBC trending down.   URI symptoms with sinusitis and ear ache  Symptoms improving.  Continue levaquin. Flu PCR negative.   Hypothyroidism  continue Synthroid   Hypertension  Resume home medications   History of gout  Continue allopurinol. Uric Acid level OK.    DVT prophylaxis  Subcutaneous Lovenox   Diet: Low-sodium  Code Status: Full code  Family Communication: None at Bedside  Disposition Plan: likely home tomorrow  HPI/Subjective: Pt reports that pain in legs is getting better  Objective: Filed Vitals:   03/11/13 0543  BP: 138/67  Pulse: 62  Temp: 98.6 F (37 C)  Resp: 18    Intake/Output Summary (Last 24 hours) at 03/11/13 0815 Last data filed at 03/10/13 2200  Gross per 24 hour  Intake   1220 ml  Output      0 ml  Net   1220 ml   Filed Weights   11-Mar-2013 1751 03/10/13 0622  Weight: 187 lb 9.8 oz (85.1 kg) 188 lb 7.9 oz (85.5 kg)    Exam: Constitutional: Vital signs reviewed. Patient is a well-developed and well-nourished in no acute distress and cooperative with exam.  HEENT: No pallor, no icterus, moist oral mucosa, no sinus tenderness  Cardiovascular: RRR, S1 normal, S2 normal, no MRG  Pulmonary/Chest: CTAB, no wheezes, rales, or rhonchi  Abdominal: Soft. Non-tender, non-distended, bowel sounds are normal,  Extremities: improved swelling and redness from yesterday.  Small areas of redness improved in LLE near ankle and no excessive warmth to palpation. No calf tenderness or swelling. Normal ROM of ankle joints. Distal  pulses palpable bilaterally Neurological: A&O x3, nonfocal  Data Reviewed: Basic Metabolic Panel:  Recent Labs Lab Mar 11, 2013 1428 03/11/13 0430  NA 136* 139  K 3.9 3.6*  CL 96 100  CO2  --  26  GLUCOSE 90 91  BUN 14 19  CREATININE 1.00 1.01  CALCIUM  --  8.8   Liver Function Tests: No results found for this basename: AST, ALT, ALKPHOS, BILITOT, PROT, ALBUMIN,  in the last 168 hours No results found for this basename: LIPASE, AMYLASE,  in the last 168 hours No results found for this basename: AMMONIA,  in the last 168 hours CBC:  Recent Labs Lab 03-11-13 1423 11-Mar-2013 1428 03/10/13 0424 03/11/13 0430  WBC 20.8*  --  19.4* 14.5*  NEUTROABS 15.7*  --   --  9.8*  HGB 12.3 13.6 11.7* 10.4*  HCT 36.2 40.0 33.7* 30.6*  MCV 87.7  --  85.8 85.7  PLT 368  --  382 348   Cardiac Enzymes: No results found for this basename: CKTOTAL, CKMB, CKMBINDEX, TROPONINI,  in the last 168 hours BNP (last 3 results)  Recent Labs  03-11-2013 1423  PROBNP 80.5   CBG: No results found for this basename: GLUCAP,  in the last 168 hours  No results found for this or any previous visit (from the past 240 hour(s)).   Studies: Dg Ankle Complete Left  2013-03-11   CLINICAL DATA:  Leg swelling without injury  EXAM: LEFT ANKLE COMPLETE - 3+ VIEW  COMPARISON:  None.  FINDINGS:  Generalized soft tissue swelling is seen. No acute fracture or dislocation is noted.  IMPRESSION: Soft tissue swelling without acute bony abnormality.   Electronically Signed   By: Alcide Clever M.D.   On: 03/09/2013 19:45   Dg Ankle Complete Right  03/09/2013   CLINICAL DATA:  Leg swelling  EXAM: RIGHT ANKLE - COMPLETE 3+ VIEW  COMPARISON:  None.  FINDINGS: Generalized soft tissue swelling is noted. No acute fracture or dislocation is seen.  IMPRESSION: Soft tissue swelling without acute bony abnormality.   Electronically Signed   By: Alcide Clever M.D.   On: 03/09/2013 19:45    Scheduled Meds: . allopurinol  100 mg Oral  Daily  . amLODipine  5 mg Oral Daily   And  . benazepril  10 mg Oral Daily  . aspirin EC  81 mg Oral Daily  . atenolol  100 mg Oral Daily  . calcium-vitamin D  1 tablet Oral BID  . dorzolamide-timolol  1 drop Both Eyes BID  . enoxaparin (LOVENOX) injection  40 mg Subcutaneous Daily  . latanoprost  1 drop Right Eye QHS  . levofloxacin (LEVAQUIN) IV  750 mg Intravenous Q48H  . levothyroxine  50 mcg Oral QAC breakfast  . potassium chloride  10 mEq Oral BID  . simvastatin  40 mg Oral QPM  . triamterene-hydrochlorothiazide  1 capsule Oral Daily  . vancomycin  1,500 mg Intravenous Q24H   Continuous Infusions:   Principal Problem:   Cellulitis Active Problems:   HYPOTHYROIDISM   OBESITY   HYPERTENSION   GERD   SIRS (systemic inflammatory response syndrome)   Upper respiratory infection   Cellulitis of leg   Miquel Lamson [facility] Adv[facility]  [facility] Pager [phone]. If 7PM-7AM, please contact night-coverage at [email or web address], password [facility] 03/11/2013, 8:15 AM  LOS: 2 days

## 2013-03-12 LAB — CBC
HCT: 34.3 % — ABNORMAL LOW (ref 36.0–46.0)
Hemoglobin: 11.6 g/dL — ABNORMAL LOW (ref 12.0–15.0)
MCH: 29.7 pg (ref 26.0–34.0)
MCHC: 33.8 g/dL (ref 30.0–36.0)
MCV: 87.9 fL (ref 78.0–100.0)
Platelets: 416 10*3/uL — ABNORMAL HIGH (ref 150–400)
RBC: 3.9 MIL/uL (ref 3.87–5.11)
RDW: 14.3 % (ref 11.5–15.5)
WBC: 13.2 10*3/uL — AB (ref 4.0–10.5)

## 2013-03-12 LAB — BASIC METABOLIC PANEL
BUN: 19 mg/dL (ref 6–23)
CALCIUM: 9.3 mg/dL (ref 8.4–10.5)
CO2: 27 meq/L (ref 19–32)
Chloride: 100 mEq/L (ref 96–112)
Creatinine, Ser: 0.96 mg/dL (ref 0.50–1.10)
GFR calc non Af Amer: 55 mL/min — ABNORMAL LOW (ref >90)
GFR, EST AFRICAN AMERICAN: 64 mL/min — AB (ref >90)
Glucose, Bld: 91 mg/dL (ref 70–99)
Potassium: 3.9 mEq/L (ref 3.7–5.3)
SODIUM: 139 meq/L (ref 137–147)

## 2013-03-12 NOTE — Progress Notes (Signed)
[facility] PROGRESS NOTE  April Donaldson ZOX:[id number] DOB: Dec 22, 1931 DOA: 03/09/2013 PCP: Michele Mcalpine, MD  Assessment/Plan: Cellulitis of legs  Patient has cellulitis involving bilateral lower leg. Given fever of 100.83F and WBC greater than 20,000 she does meet criteria for SIRS. Admitted to medical floor. Continue vancomycin. Doppler of lower extremities negative for DVT. uric acid level 6.5. Will get physical therapy. WBC trending down.   URI symptoms with sinusitis and ear ache  Symptoms improving. Continue levaquin. Flu PCR negative.   Hypothyroidism  continue Synthroid   Hypertension  Resume home medications although patient has refused amlodipine because she takes lotrel at home and I tried to explain to her that the hospital substituted her lotrel for the 2 separate drug components but it is the same medication  History of gout  Continue allopurinol. Uric Acid level OK.   DVT prophylaxis  Subcutaneous Lovenox   Diet: Low-sodium  Code Status: Full code  Family Communication: None at Bedside  Disposition Plan: pt says she has no arrangements or assistance at home to go now but will have assistance and arrangements tomorrow for discharge home  HPI/Subjective: Pt reports that she still have some pain in right leg on occasion but overall improving, much less sinus drainage  Objective: Filed Vitals:   03/12/13 0420  BP: 148/72  Pulse: 70  Temp: 98.3 F (36.8 C)  Resp: 18    Intake/Output Summary (Last 24 hours) at 03/12/13 0848 Last data filed at 03/11/13 2316  Gross per 24 hour  Intake    890 ml  Output      0 ml  Net    890 ml   Filed Weights   03/09/13 1751 03/10/13 0622  Weight: 187 lb 9.8 oz (85.1 kg) 188 lb 7.9 oz (85.5 kg)    Exam: Constitutional: Vital signs reviewed. Patient is a well-developed and well-nourished in no acute distress and cooperative with exam.  HEENT: No pallor, no icterus, moist oral mucosa, no sinus tenderness   Cardiovascular: RRR, S1 normal, S2 normal, no MRG  Pulmonary/Chest: CTAB, no wheezes, rales, or rhonchi  Abdominal: Soft. Non-tender, non-distended, bowel sounds are normal,  Extremities: improved swelling and redness noted. Small areas of redness improved in LLE near ankle and no excessive warmth to palpation. No calf tenderness or swelling. Normal ROM of ankle joints. Distal pulses palpable bilaterally Neurological: A&O x3, nonfocal  Data Reviewed: Basic Metabolic Panel:  Recent Labs Lab 03/09/13 1428 03/11/13 0430 03/12/13 0544  NA 136* 139 139  K 3.9 3.6* 3.9  CL 96 100 100  CO2  --  26 27  GLUCOSE 90 91 91  BUN 14 19 19   CREATININE 1.00 1.01 0.96  CALCIUM  --  8.8 9.3   Liver Function Tests: No results found for this basename: AST, ALT, ALKPHOS, BILITOT, PROT, ALBUMIN,  in the last 168 hours No results found for this basename: LIPASE, AMYLASE,  in the last 168 hours No results found for this basename: AMMONIA,  in the last 168 hours CBC:  Recent Labs Lab 03/09/13 1423 03/09/13 1428 03/10/13 0424 03/11/13 0430 03/12/13 0544  WBC 20.8*  --  19.4* 14.5* 13.2*  NEUTROABS 15.7*  --   --  9.8*  --   HGB 12.3 13.6 11.7* 10.4* 11.6*  HCT 36.2 40.0 33.7* 30.6* 34.3*  MCV 87.7  --  85.8 85.7 87.9  PLT 368  --  382 348 416*   Cardiac Enzymes: No results found for this basename: CKTOTAL, CKMB,  CKMBINDEX, TROPONINI,  in the last 168 hours BNP (last 3 results)  Recent Labs  03/09/13 1423  PROBNP 80.5   CBG: No results found for this basename: GLUCAP,  in the last 168 hours  No results found for this or any previous visit (from the past 240 hour(s)).   Studies: No results found.  Scheduled Meds: . allopurinol  100 mg Oral Daily  . amLODipine  5 mg Oral Daily   And  . benazepril  10 mg Oral Daily  . aspirin EC  81 mg Oral Daily  . atenolol  100 mg Oral Daily  . atorvastatin  20 mg Oral QHS  . calcium-vitamin D  1 tablet Oral BID  . dorzolamide-timolol  1  drop Both Eyes BID  . enoxaparin (LOVENOX) injection  40 mg Subcutaneous Daily  . latanoprost  1 drop Right Eye QHS  . levofloxacin (LEVAQUIN) IV  750 mg Intravenous Q48H  . levothyroxine  50 mcg Oral QAC breakfast  . potassium chloride  10 mEq Oral BID  . triamterene-hydrochlorothiazide  1 capsule Oral Daily  . vancomycin  1,500 mg Intravenous Q24H   Continuous Infusions:   Principal Problem:   Cellulitis Active Problems:   HYPOTHYROIDISM   OBESITY   HYPERTENSION   GERD   SIRS (systemic inflammatory response syndrome)   Upper respiratory infection   Cellulitis of leg   April Donaldson [facility]  [facility] Pager [phone]. If 7PM-7AM, please contact night-coverage at [email or web address], password [facility] 03/12/2013, 8:48 AM  LOS: 3 days

## 2013-03-12 NOTE — ED Provider Notes (Signed)
Medical screening examination/treatment/procedure(s) were conducted as a shared visit with non-physician practitioner(s) and myself.  I personally evaluated the patient during the encounter.  EKG Interpretation    Date/Time:    Ventricular Rate:    PR Interval:    QRS Duration:   QT Interval:    QTC Calculation:   R Axis:     Text Interpretation:               Leota Jacobsen, MD 03/12/13 1935

## 2013-03-13 LAB — CBC
HCT: 32.5 % — ABNORMAL LOW (ref 36.0–46.0)
Hemoglobin: 10.9 g/dL — ABNORMAL LOW (ref 12.0–15.0)
MCH: 29.5 pg (ref 26.0–34.0)
MCHC: 33.5 g/dL (ref 30.0–36.0)
MCV: 88.1 fL (ref 78.0–100.0)
Platelets: 387 10*3/uL (ref 150–400)
RBC: 3.69 MIL/uL — AB (ref 3.87–5.11)
RDW: 14.2 % (ref 11.5–15.5)
WBC: 11 10*3/uL — AB (ref 4.0–10.5)

## 2013-03-13 MED ORDER — SIMVASTATIN 20 MG PO TABS: 20.0000 mg | ORAL_TABLET | Freq: Every evening | ORAL | Status: DC

## 2013-03-13 MED ORDER — LEVOFLOXACIN 750 MG PO TABS: 750.0000 mg | ORAL_TABLET | ORAL | Status: AC

## 2013-03-13 NOTE — Discharge Instructions (Signed)
Return if symptoms recur, worsen or new problems develop See your primary care provider in 1 week for recheck Have your labs checked when you follow up next week.  Your simvastatin dose was cut to 20 mg by pharmacy because of a potential interaction with amlodipine for safety reasons.   Cellulitis Cellulitis is an infection of the skin and the tissue under the skin. The infected area is usually red and tender. This happens most often in the arms and lower legs. HOME CARE   Take your antibiotic medicine as told. Finish the medicine even if you start to feel better.  Keep the infected arm or leg raised (elevated).  Put a warm cloth on the area up to 4 times per day.  Only take medicines as told by your doctor.  Keep all doctor visits as told. GET HELP RIGHT AWAY IF:   You have a fever.  You feel very sleepy.  You throw up (vomit) or have watery poop (diarrhea).  You feel sick and have muscle aches and pains.  You see red streaks on the skin coming from the infected area.  Your red area gets bigger or turns a dark color.  Your bone or joint under the infected area is painful after the skin heals.  Your infection comes back in the same area or different area.  You have a puffy (swollen) bump in the infected area.  You have new symptoms. MAKE SURE YOU:   Understand these instructions.  Will watch your condition.  Will get help right away if you are not doing well or get worse. Document Released: 08/02/2007 Document Revised: 08/15/2011 Document Reviewed: 05/01/2011 [facility] Patient Information 2014 [place], [place].  Sinusitis Sinusitis is redness, soreness, and puffiness (inflammation) of the air pockets in the bones of your face (sinuses). The redness, soreness, and puffiness can cause air and mucus to get trapped in your sinuses. This can allow germs to grow and cause an infection.  HOME CARE   Drink enough fluids to keep your pee (urine) clear or pale yellow.  Use  a humidifier in your home.  Run a hot shower to create steam in the bathroom. Sit in the bathroom with the door closed. Breathe in the steam 3 4 times a day.  Put a warm, moist washcloth on your face 3 4 times a day, or as told by your doctor.  Use salt water sprays (saline sprays) to wet the thick fluid in your nose. This can help the sinuses drain.  Only take medicine as told by your doctor. GET HELP RIGHT AWAY IF:   Your pain gets worse.  You have very bad headaches.  You are sick to your stomach (nauseous).  You throw up (vomit).  You are very sleepy (drowsy) all the time.  Your face is puffy (swollen).  Your vision changes.  You have a stiff neck.  You have trouble breathing. MAKE SURE YOU:   Understand these instructions.  Will watch your condition.  Will get help right away if you are not doing well or get worse. Document Released: 08/02/2007 Document Revised: 11/08/2011 Document Reviewed: 09/19/2011 [facility] Patient Information 2014 [place].  Upper Respiratory Infection, Adult An upper respiratory infection (URI) is also known as the common cold. It is often caused by a type of germ (virus). Colds are easily spread (contagious). You can pass it to others by kissing, coughing, sneezing, or drinking out of the same glass. Usually, you get better in 1 or 2 weeks.  HOME CARE   Only take medicine as told by your doctor.  Use a warm mist humidifier or breathe in steam from a hot shower.  Drink enough water and fluids to keep your pee (urine) clear or pale yellow.  Get plenty of rest.  Return to work when your temperature is back to normal or as told by your doctor. You may use a face mask and wash your hands to stop your cold from spreading. GET HELP RIGHT AWAY IF:   After the first few days, you feel you are getting worse.  You have questions about your medicine.  You have chills, shortness of breath, or brown or red spit (mucus).  You have yellow  or brown snot (nasal discharge) or pain in the face, especially when you bend forward.  You have a fever, puffy (swollen) neck, pain when you swallow, or white spots in the back of your throat.  You have a bad headache, ear pain, sinus pain, or chest pain.  You have a high-pitched whistling sound when you breathe in and out (wheezing).  You have a lasting cough or cough up blood.  You have sore muscles or a stiff neck. MAKE SURE YOU:   Understand these instructions.  Will watch your condition.  Will get help right away if you are not doing well or get worse. Document Released: 08/02/2007 Document Revised: 05/08/2011 Document Reviewed: 06/20/2010 [facility] Patient Information 2014 [place], [place].

## 2013-03-13 NOTE — Discharge Summary (Signed)
Physician Discharge Summary  April Donaldson [id number] DOB: Mar 23, 1931 DOA: 03/09/2013  PCP: Noralee Space, MD  Admit date: 03/09/2013 Discharge date: 03/13/2013  Recommendations for Outpatient Follow-up:  1. Please check BMP and liver enzymes on follow up  2. Pharmacy recommended cutting simvastatin dose to 20 mg for potential reaction with amlodipine 3. Please recheck CBC on followup   Discharge Diagnoses:  Principal Problem:   Cellulitis Active Problems:   HYPOTHYROIDISM   OBESITY   HYPERTENSION   GERD   SIRS (systemic inflammatory response syndrome)   Upper respiratory infection   Cellulitis of leg   Discharge Condition: stable  Diet recommendation: heart healthy  Filed Weights   03/09/13 1751 03/10/13 0622  Weight: 187 lb 9.8 oz (85.1 kg) 188 lb 7.9 oz (85.5 kg)    History of present illness:  80 year old female with multiple medical history as outlined below presented to the ED with symptoms of pain in bilateral lower legs and swelling for past 3-4 days. Patient reports having pain and swelling over the right lower leg near the ankle with erythema about 4 days back. This was followed by pain and swelling over the left lower leg the next day. Patient has difficulty ambulating because of the pain. She denies any fever or chills. She denies any trauma or recent travel. She denies similar symptoms in the past. She also reports having sinus congestion and ear ache for possible days for which her PCP had feeling prescription for Z-Pak yesterday. She denies any chills, headache, blurred vision, dizziness, nausea, vomiting, chest pain, palpitations, shortness of breath, abdominal pain, bowel or urinary symptoms. Denies recent history of gouty arthritis.  Course in the ED  Patient was found to be febrile to 100.5 degree Fahrenheit. Remaining vitals were stable. Blood work done sordid cytosis with WBC of 20.8 thousand, chemistry was normal. Bilateral x-ray of the ankle showed  generalized soft tissue swelling without any fracture or dislocation. With clinical findings of cellulitis of the bilateral leg patient was started on IV vancomycin and [facility] consulted for admission to medical floor.  Hospital Course:   Cellulitis of legs  Patient has cellulitis involving bilateral lower leg. Given fever of 100.23F and WBC greater than 20,000 she does meet criteria for SIRS. Admitted to medical floor. Continue vancomycin. Doppler of lower extremities negative for DVT. uric acid level 6.5. Got physical therapy. WBC trending down.   URI symptoms with sinusitis and ear ache  Symptoms improving. Continue levaquin. Flu PCR negative.   Hypothyroidism  continue Synthroid   Hypertension  Resume home medications although patient has refused amlodipine because she takes lotrel at home and I tried to explain to her that the hospital substituted her lotrel for the 2 separate drug components but it is the same medication   History of gout  Continue allopurinol. Uric Acid level OK.   DVT prophylaxis  Subcutaneous Lovenox   Diet: Low-sodium   Code Status: Full code  Family Communication: None at Bedside  Disposition Plan: discharge home   Procedures: Bilateral lower extremity venous Dopplers completed.  Preliminary report: There is no DVT or SVT noted in the bilateral lower extremities.  KANADY, CANDACE, RVT  03/10/2013, 8:30 AM  Discharge Exam:Pt reports that she is feeling well today.  She is ambulating well and pain in her legs and swelling is gone now.   Filed Vitals:   03/13/13 0427  BP: 133/63  Pulse: 63  Temp: 97.8 F (36.6 C)  Resp: 18   Constitutional: Vital  signs reviewed. Patient is a well-developed and well-nourished in no acute distress and cooperative with exam.  HEENT: No pallor, no icterus, moist oral mucosa, no sinus tenderness  Cardiovascular: RRR, S1 normal, S2 normal, no MRG  Pulmonary/Chest: CTAB, no wheezes, rales, or rhonchi   Abdominal: Soft. Non-tender, non-distended, bowel sounds are normal,  Extremities: resolved swelling and redness noted.  No calf tenderness or swelling. Normal ROM of ankle joints. Distal pulses palpable bilaterally Neurological: A&O x3, nonfocal  Discharge Instructions  Discharge Orders   Future Appointments Provider Department Dept Phone   03/24/2013 10:00 AM Noralee Space, MD [facility] [phone]   06/02/2013 12:00 PM Kathee Delton, MD [facility] [phone]   Future Orders Complete By Expires   Diet - low sodium heart healthy  As directed    Discharge instructions  As directed    Comments:     Return if symptoms recur, worsen or new problems develop See your primary care provider in 1 week for recheck Have your labs checked when you follow up next week.  Your simvastatin dose was cut to 20 mg by pharmacy because of a potential interaction with amlodipine.   Discontinue IV  As directed    Increase activity slowly  As directed        Medication List    STOP taking these medications       azithromycin 250 MG tablet  Commonly known as:  ZITHROMAX      TAKE these medications       allopurinol 100 MG tablet  Commonly known as:  ZYLOPRIM  Take 100 mg by mouth daily.     amLODipine-benazepril 5-10 MG per capsule  Commonly known as:  LOTREL  Take 1 capsule by mouth daily.     aspirin 81 MG tablet  Take 81 mg by mouth daily.     atenolol 100 MG tablet  Commonly known as:  TENORMIN  Take 100 mg by mouth daily.     CALTRATE 600+D 600-400 MG-UNIT per tablet  Generic drug:  Calcium Carbonate-Vitamin D  Take 1 tablet by mouth 2 (two) times daily.     CENTRUM SILVER PO  Take 1 tablet by mouth daily.     dorzolamide-timolol 22.3-6.8 MG/ML ophthalmic solution  Commonly known as:  COSOPT  Place 1 drop into both eyes 2 (two) times daily.     latanoprost 0.005 % ophthalmic solution  Commonly known as:  XALATAN  Place 1 drop into the right eye at  bedtime.     levofloxacin 750 MG tablet  Commonly known as:  LEVAQUIN  Take 1 tablet (750 mg total) by mouth every other day.     levothyroxine 50 MCG tablet  Commonly known as:  SYNTHROID, LEVOTHROID  Take 50 mcg by mouth daily before breakfast.     MUCINEX MAXIMUM STRENGTH 1200 MG Tb12  Generic drug:  Guaifenesin  Take 1 tablet by mouth as needed.     potassium chloride 10 MEQ CR capsule  Commonly known as:  MICRO-K  Take 10 mEq by mouth 2 (two) times daily.     simvastatin 20 MG tablet  Commonly known as:  ZOCOR  Take 1 tablet (20 mg total) by mouth every evening.     triamterene-hydrochlorothiazide 37.5-25 MG per capsule  Commonly known as:  DYAZIDE  Take 1 each (1 capsule total) by mouth daily.       Allergies  Allergen Reactions  . Amlodipine Besylate     Causes  swelling  . Clonidine     Sweating, nausea, felt faint  . Diltiazem Hcl     meds did not work for her----causes swelling  . Doxycycline Hyclate     REACTION:  tetracycline  . Iodine     IVP contrast  . Labetalol Hcl   . Olmesartan Medoxomil   . Penicillins     Pt not allergic--just causes yeast infections       Follow-up Information   Follow up with NADEL,SCOTT M, MD. Schedule an appointment as soon as possible for a visit in 1 week. [facility] Followup )    Specialty:  Pulmonary Disease   Contact information:   [place] [address] [phone]      The results of significant diagnostics from this hospitalization (including imaging, microbiology, ancillary and laboratory) are listed below for reference.    Significant Diagnostic Studies: Dg Ankle Complete Left  03-16-13   CLINICAL DATA:  Leg swelling without injury  EXAM: LEFT ANKLE COMPLETE - 3+ VIEW  COMPARISON:  None.  FINDINGS: Generalized soft tissue swelling is seen. No acute fracture or dislocation is noted.  IMPRESSION: Soft tissue swelling without acute bony abnormality.   Electronically Signed   By: Inez Catalina M.D.    On: 03/16/13 19:45   Dg Ankle Complete Right  03-16-13   CLINICAL DATA:  Leg swelling  EXAM: RIGHT ANKLE - COMPLETE 3+ VIEW  COMPARISON:  None.  FINDINGS: Generalized soft tissue swelling is noted. No acute fracture or dislocation is seen.  IMPRESSION: Soft tissue swelling without acute bony abnormality.   Electronically Signed   By: Inez Catalina M.D.   On: 2013/03/16 19:45    Microbiology: No results found for this or any previous visit (from the past 240 hour(s)).   Labs: Basic Metabolic Panel:  Recent Labs Lab 2013-03-16 1428 03/11/13 0430 03/12/13 0544  NA 136* 139 139  K 3.9 3.6* 3.9  CL 96 100 100  CO2  --  26 27  GLUCOSE 90 91 91  BUN 14 19 19   CREATININE 1.00 1.01 0.96  CALCIUM  --  8.8 9.3   Liver Function Tests: No results found for this basename: AST, ALT, ALKPHOS, BILITOT, PROT, ALBUMIN,  in the last 168 hours No results found for this basename: LIPASE, AMYLASE,  in the last 168 hours No results found for this basename: AMMONIA,  in the last 168 hours CBC:  Recent Labs Lab Mar 16, 2013 1423 03/16/13 1428 03/10/13 0424 03/11/13 0430 03/12/13 0544 03/13/13 0245  WBC 20.8*  --  19.4* 14.5* 13.2* 11.0*  NEUTROABS 15.7*  --   --  9.8*  --   --   HGB 12.3 13.6 11.7* 10.4* 11.6* 10.9*  HCT 36.2 40.0 33.7* 30.6* 34.3* 32.5*  MCV 87.7  --  85.8 85.7 87.9 88.1  PLT 368  --  382 348 416* 387   Cardiac Enzymes: No results found for this basename: CKTOTAL, CKMB, CKMBINDEX, TROPONINI,  in the last 168 hours BNP: BNP (last 3 results)  Recent Labs  2013/03/16 1423  PROBNP 80.5   CBG: No results found for this basename: GLUCAP,  in the last 168 hours  Signed:  Clanford Johnson  [facility] 03/13/2013, 8:56 AM

## 2013-03-17 ENCOUNTER — Telehealth: Payer: Self-pay | Admitting: Pulmonary Disease

## 2013-03-17 NOTE — Telephone Encounter (Signed)
Spoke with pt. She states that she will just keep her appointment with SN on 03/24/13 at 10am. Nothing further was needed.

## 2013-03-24 ENCOUNTER — Other Ambulatory Visit (INDEPENDENT_AMBULATORY_CARE_PROVIDER_SITE_OTHER): Payer: Medicare Other

## 2013-03-24 ENCOUNTER — Encounter: Payer: Self-pay | Admitting: Pulmonary Disease

## 2013-03-24 ENCOUNTER — Ambulatory Visit (INDEPENDENT_AMBULATORY_CARE_PROVIDER_SITE_OTHER): Payer: Medicare Other | Admitting: Pulmonary Disease

## 2013-03-24 VITALS — BP 124/68 | HR 60 | Temp 98.1°F | Ht 59.5 in | Wt 183.4 lb

## 2013-03-24 DIAGNOSIS — I10 Essential (primary) hypertension: Secondary | ICD-10-CM

## 2013-03-24 DIAGNOSIS — E669 Obesity, unspecified: Secondary | ICD-10-CM

## 2013-03-24 DIAGNOSIS — E7800 Pure hypercholesterolemia, unspecified: Secondary | ICD-10-CM

## 2013-03-24 DIAGNOSIS — M1990 Unspecified osteoarthritis, unspecified site: Secondary | ICD-10-CM

## 2013-03-24 DIAGNOSIS — L03119 Cellulitis of unspecified part of limb: Secondary | ICD-10-CM

## 2013-03-24 DIAGNOSIS — G4733 Obstructive sleep apnea (adult) (pediatric): Secondary | ICD-10-CM

## 2013-03-24 DIAGNOSIS — F411 Generalized anxiety disorder: Secondary | ICD-10-CM

## 2013-03-24 DIAGNOSIS — I872 Venous insufficiency (chronic) (peripheral): Secondary | ICD-10-CM

## 2013-03-24 DIAGNOSIS — M545 Low back pain, unspecified: Secondary | ICD-10-CM

## 2013-03-24 DIAGNOSIS — K589 Irritable bowel syndrome without diarrhea: Secondary | ICD-10-CM

## 2013-03-24 DIAGNOSIS — D126 Benign neoplasm of colon, unspecified: Secondary | ICD-10-CM

## 2013-03-24 DIAGNOSIS — L02419 Cutaneous abscess of limb, unspecified: Secondary | ICD-10-CM

## 2013-03-24 DIAGNOSIS — E039 Hypothyroidism, unspecified: Secondary | ICD-10-CM

## 2013-03-24 DIAGNOSIS — K219 Gastro-esophageal reflux disease without esophagitis: Secondary | ICD-10-CM

## 2013-03-24 DIAGNOSIS — D3000 Benign neoplasm of unspecified kidney: Secondary | ICD-10-CM

## 2013-03-24 DIAGNOSIS — I679 Cerebrovascular disease, unspecified: Secondary | ICD-10-CM

## 2013-03-24 LAB — CBC WITH DIFFERENTIAL/PLATELET
BASOS PCT: 0.4 % (ref 0.0–3.0)
Basophils Absolute: 0 10*3/uL (ref 0.0–0.1)
EOS ABS: 0.2 10*3/uL (ref 0.0–0.7)
Eosinophils Relative: 2.1 % (ref 0.0–5.0)
HCT: 37.1 % (ref 36.0–46.0)
Hemoglobin: 12.2 g/dL (ref 12.0–15.0)
LYMPHS PCT: 36.8 % (ref 12.0–46.0)
Lymphs Abs: 4 10*3/uL (ref 0.7–4.0)
MCHC: 33 g/dL (ref 30.0–36.0)
MCV: 88 fl (ref 78.0–100.0)
MONOS PCT: 8.8 % (ref 3.0–12.0)
Monocytes Absolute: 1 10*3/uL (ref 0.1–1.0)
NEUTROS PCT: 51.9 % (ref 43.0–77.0)
Neutro Abs: 5.7 10*3/uL (ref 1.4–7.7)
Platelets: 387 10*3/uL (ref 150.0–400.0)
RBC: 4.21 Mil/uL (ref 3.87–5.11)
RDW: 14.9 % — ABNORMAL HIGH (ref 11.5–14.6)
WBC: 11 10*3/uL — ABNORMAL HIGH (ref 4.5–10.5)

## 2013-03-24 LAB — TSH: TSH: 2.82 u[IU]/mL (ref 0.35–5.50)

## 2013-03-24 LAB — LIPID PANEL
CHOLESTEROL: 139 mg/dL (ref 0–200)
HDL: 49.6 mg/dL (ref >39.00)
LDL Cholesterol: 70 mg/dL (ref 0–99)
TRIGLYCERIDES: 96 mg/dL (ref 0.0–149.0)
Total CHOL/HDL Ratio: 3
VLDL: 19.2 mg/dL (ref 0.0–40.0)

## 2013-03-24 LAB — HEPATIC FUNCTION PANEL
ALBUMIN: 3.8 g/dL (ref 3.5–5.2)
ALT: 19 U/L (ref 0–35)
AST: 19 U/L (ref 0–37)
Alkaline Phosphatase: 86 U/L (ref 39–117)
Bilirubin, Direct: 0 mg/dL (ref 0.0–0.3)
TOTAL PROTEIN: 8 g/dL (ref 6.0–8.3)
Total Bilirubin: 0.5 mg/dL (ref 0.3–1.2)

## 2013-03-24 LAB — BASIC METABOLIC PANEL
BUN: 16 mg/dL (ref 6–23)
CO2: 28 meq/L (ref 19–32)
CREATININE: 1.1 mg/dL (ref 0.4–1.2)
Calcium: 10 mg/dL (ref 8.4–10.5)
Chloride: 104 mEq/L (ref 96–112)
GFR: 64.99 mL/min (ref >60.00)
GLUCOSE: 82 mg/dL (ref 70–99)
Potassium: 4 mEq/L (ref 3.5–5.1)
Sodium: 141 mEq/L (ref 135–145)

## 2013-03-24 MED ORDER — SIMVASTATIN 20 MG PO TABS: 20.0000 mg | ORAL_TABLET | Freq: Every evening | ORAL | Status: DC

## 2013-03-24 NOTE — Patient Instructions (Signed)
Today we updated your med list in our EPIC system...    Continue your current medications the same...  Today we rechecked your FASTING blood work...    We will contact you w/ the results when available...   Keep up the good work w/ diet & exercise...  Let me know if I can be of service in any way.Marland KitchenMarland Kitchen

## 2013-03-24 NOTE — Progress Notes (Signed)
Subjective:    Patient ID: April Donaldson, female    DOB: [birth year], 80 y.o.   MRN: [id number]  HPI 80 y/o BF here for a 6 month follow up visit... she has multiple medical problems as noted below... 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  ~  September 18, 2011:  6mo ROV & she reports "somewhat OK" w/ mult somatic complaints & questions today; she's been on Levoxyl & no longer avail but she's worried how the generic "will react w/ my body"- I reassured her;  "I'm getting speckled" c/o brown spots (nevi), red spots (cherry hemangiomas), & encouraged to get "body scan" w/ Derm;  She had [facility] screen- all neg/ normal x CDoppler w/ mod left carotid dis ident> we discussed the need for formal CDopplers, note- no bruits heard...  Note- she had last Tetanus 2002 & due for TDAP today...    We reviewed prob list, meds, xrays and labs> see below for updates>>  Carotid Dopplers ==> 7/13 showed mild heterogenous plaque- not hemodynam signif- velocities wnl, 0-39% bilat ICA stenoses; plan repeat 15yrs...  ~  December 08, 2011:  70mo ROV & add on at pt request for an episode of SOB> occurred in the shower 3d ago, ?felt choked, couldn't get a breath, got out of shower, beat herself on the chest & "my breathing started back up", felt like it was a plug, but she is adamant that she did NOT cough up any mucous or a plug of anything; she admits that she was very anxious about this, main sensation was not being able to get the air "in"; she does admit to occas prob getting strangled on saliva (no prob w/ solid foods, just w/ saliva) & she had prev eval by DrCrossley w/ MBS- we can't find these results in Epic...     We reviewed prev labs & XRay results in Epic;  We discussed how  cricopharyngeus muscle dysfunction vs VCD can cause these episodes & a lump-like feeling in throat;  She wants further eval & in this regard we discussed checking:    LABS showed:  Chems- wnl & similar to labs 8/13 from [facility];  CBC- ok x wbc=11.0;  TSH=4.37;  Sed=32  CXR showed heart at upper lim of normal, calcif in Ao arch, sl eventration of rt hemidiaph, mild basilar atx, NAD, no change from old films...  PFT> poor tracings, best of the 3 w/ normal airflow, no obstruction; FVC=1.86 (104%), FEV1=1.55 (115%), %1sec=83, mid-flows=154% pred...  CTChest did not show any findings to explain her symptoms; incidental findings included coronary calcif, clear lungs, ?cyst in liver & right kidney... Med Rx trial w/ Klonopin 0.5mg  Bid is recommended & lots of reassurance given... ADDENDUM >> pt reports that Klonopin seems to be working very well; no episodes since starting Rx; med seems a bit sedating for her & rec to decr to 1/2 tab bid...  ~  March 21, 2012:  42mo ROV & [name] notes that the Klonopin reacted on her- caused "crazy dreams" and even 1/2 tab was too strong so she stopped it; she states that she has not had any recurrence of the SOB, choking, etc & she feels better overall;  She had f/u DrCrossley who wondered about a food allergy & suggested that she come in  to see him immediately if this recurred; She is requesting a new Rx for her Dyazide as she has had a prob w/ [facility] & wants to fill this med locally- Rx written for #90 per request...  We reviewed the following medical problems during today's office visit >>     OSA> hx sleep study 2009 w/ RDI=20, desat to 66%, mild snoring; trial CPAP10 per DrClance & daytime alertness improved; still hasn't lost the weight!    HBP, HxAtypCP> on Aten100, Lotrel5-10, Dyazide, MicroK10-2/d;  BP=138/78 & she feels better- denies CP, palpit, SOB, edema; reminded to elim salt & get wt down.    Cerebrovasc dis> on ASA81; CDopplers 7/13 w/ mild  heterogeneous plaque, no signif ICA stenosis; she denies cerebral ischemic symptoms...    Ven Insuffic> she knows to elim salt/sodium, elev legs, wear support hose, & take the Dyazide...    Chol> on Simva40; FLP shows TChol 168, TG 154, HDL 57, LDL 80    Hypothy> on Synthroid50;  Labs 10/13 showed TSH=4.37 & she is clinically euthyroid...    Obesity> on diet, not able to exercise sufficiently; wt=193# is up 14# in 3 mo! & we reviewed diet/ exercise/ wt reduction strategies...    GI- GERD, IBS, Polyps> she uses OTC Prilosec20 as needed; last colon 3/11 by DrGessner showed tort colon divertics, stenosis in sigmoid, hems & 2 sm adenomas- f/u 41yrs.    Renal Infuffic, Angiomyolipoma> followed by DrBorden, AML confirmed by MRI, she is seen Q63mo due to her severe anxiety about the lesion- seen 11/13 & no change; Creat stable at ~1.0    DJD, Hx Gout, LBP> followed by her Chiropractor & Ortho- DrHilts; recently checked her for c/o hip pain; on Allopurinol100 & Uric= 6.6    Anxiety> she is off the [employer] Rx... We reviewed prob list, meds, xrays and labs> see below for updates >>   LABS 1/14:  FLP- at goals on Simva40;  Chems- LFTs wnl;  Uric- 6.6  ~  September 18, 2012:  62mo ROV & [name] indicates "I'm doing pretty good" just c/o some swelling in her legs L foot > right foot & we reviewed dietary sodium restriction, elevation, support hose (she doesn't want a stronger diuretic);      She saw DrClance 4/14 for OSA- using the CPAP only intermittently & he rec incr humidity & wt reduction...    BP is well controlled on her Aten, Lotrel, Dyazide; BP= 126/68 today & she denies CP, palpit SOB, etc...    She remains on Simva40 for her Lipids and last FLP 1/14 was essentially within parameters...     She is also taking Synthroid50 7 remains clinically euthyropid w/ TSH 10/13 = 4.37    She has lost a few lbs down 5# to 188# (which in her 61" frame is a BMI= 38-9; we reviewed diet, exercise, wt reduction  strategies... We reviewed prob list, meds, xrays and labs> see below for updates >>   ~  March 24, 2013:  48mo ROV post hosp check> she called w/ URI & given ZPak, then developed swelling in bilat legs & cellulitis and was ADM 1/11 - 03/13/13 by [facility] & treated w/ Vanco, ch to [id number] at [place]...     OSA> hx sleep study 2009 w/ RDI=20, desat to 66%, mild snoring; trial CPAP10 per DrClance & daytime alertness improved; still hasn't lost the weight!    HBP, HxAtypCP> on Aten100, Lotrel5-10, Dyazide, MicroK10-2/d;  BP=124/68 & she denies CP, palpit, SOB,  edema; reminded to elim salt & get wt down.    Cerebrovasc dis> on ASA81; CDopplers 7/13 w/ mild heterogeneous plaque, no signif ICA stenosis; she denies cerebral ischemic symptoms...    Ven Insuffic, hx cellulitis> Adm 1/15 w/ LE cellulitis & treated w/ Vanco=> Levaquin & resolved; she knows to elim salt/sodium, elev legs, wear support hose, & take the Dyazide...    Chol> on Simva20 now (cut from 40 by Pharm during 1/15 hosp); FLP 1/15 shows TChol 139, TG 96, HDL 50, LDL 70    Hypothy> on Synthroid50;  Labs 1/15 showed TSH=2.82 & she remains clinically euthyroid...    Obesity> on diet, not able to exercise sufficiently; wt=183# is improved & we reviewed diet/ exercise/ wt reduction strategies...    GI- GERD, IBS, Polyps> she uses OTC Prilosec20 as needed; last colon 3/11 by DrGessner showed tort colon divertics, stenosis in sigmoid, hems & 2 sm adenomas- f/u 106yrs.    Renal Infuffic, Angiomyolipoma> followed by DrBorden, AML confirmed by MRI, she is seen Q82mo due to her severe anxiety about the lesion- seen 5/14 & no change; Creat stable at ~1.0    DJD, Hx Gout, LBP> followed by her Chiropractor & Ortho- DrHilts; recently checked her for c/o hip pain; on Allopurinol100 & Uric= 6.6    Anxiety> she is off the [employer] Rx... We reviewed prob list, meds, xrays and labs> see below for updates >>   LABS 1/15:  FLP- at goals on Simva20;  Chems- wnl;   CBC- ok w/ Hg=12.2, WBC=11K;  TSH=2.82 on Synthroid50...  She wants to establish w/ DrLeschber for Primary Care...           Problem List:  GLAUCOMA (ICD-365.9) - followed by DrShapiro on gtts... 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  OBSTRUCTIVE SLEEP APNEA (ICD-780.57) - Hx borderline sleep study in past w/ RDI=12, desat to 87%... increased symptoms of daytime hypersomn resulted in repeat sleep study 8/09 w/ RDI= 20, mild snoring, & desat to 66%- so she was started on CPAP by DrClance, adjusted to 10cm pressure and she reports doing well after mask adjustments... ~  2/12:  yearly f/u DrClance- doing well, no changes made. ~  3/13:  She saw DrClance in follow up> using CPAP compliantly, no mask issues, daytime alertness is iproved, asked to work on wt reduction. ~  10/13: CXR showed heart at upper lim of normal, calcif in Ao arch, sl eventration of rt hemidiaph, mild basilar atx, NAD, no change from old films... ~  10/13: PFT> poor tracings, best of the 3 w/ normal airflow, no obstruction; FVC=1.86 (104%), FEV1=1.55 (115%), %1sec=83, mid-flows=154% pred... ~  10/13: CTChest did not show any findings to explain her symptoms (SOB- choking, difficulty getting the air in); incidental findings included coronary calcif, clear lungs, ?cyst in liver & right kidney... ~  4/14: she saw DrClance for Sleep f/u> rec to incr humidity & work on wt reduction... ~  7/14: she saw DrCrossley for ENT eval> hx thyroid surg 7 max sinus surg in past; mild congestion, right ear popping, hearing loss, exam neg, normal audiogram  HYPERTENSION (ICD-401.9) - controlled on ATENOLOL 100mg /d, LOTREL 5-10 daily, & DYAZIDE 1/d, & KCl 19mEq- 2daily... ~  2DEcho 4/08 was normal w/ EF=55-65%...  ~  NuclearStressTest 4/08 was negative- no ischemia & EF=79% (similiar to 2002)... ~  Dobutamine stress Echo 4/10 was neg as well... ~  CXR 1/11 showed stable cardiomeg, ectasia of thorAo, incr markings bases, NAD.Marland Kitchen. ~  CXR 1/13  showed borderline heart size, clear lungs, TSpine w/ mild spondylosis... ~  CXR 10/13 showed heart at upper lim of normal, calcif in Ao arch, sl eventration of rt hemidiaph, mild basilar atx, NAD, no change from old films... ~  [place] 10/13 showed normal heart size, no effusions, clear lungs, +coronary calcif, cysts on liver & right kidney... ~  1/14: on Aten100, Lotrel5-10, Dyazide, MicroK10-2/d;  BP=138/78 & she feels better- denies CP, palpit, SOB, edema; reminded to elim salt & get wt down. ~  1/15: on Aten100, Lotrel5-10, Dyazide, MicroK10-2/d;  BP=124/68 & she denies CP, palpit, SOB, edema; reminded to elim salt & get wt down..  CHEST PAIN, ATYPICAL (ICD-786.59) - she went to the ER 3/10 w/ atyp CP & ruled out for MI- seen by Cherly Hensen w/ neg DobutEcho 4/10 (no change in meds)...  CEREBROVASCULAR DISEASE (ICD-437.9) - on ASA 81mg /d... prev CDopplers 6/04 showed mild plaque, 0-39% bilat... ~  CDopplers 7/13 showed mild heterogenous plaque- not hemodynam signif- velocities wnl, 0-39% bilat ICA stenoses; plan repeat 100yrs... ~  1/14:  She denies cerebral ischemic symptoms & appears stable on ASA 81mg /d...  VENOUS INSUFFICIENCY (ICD-459.81) - on low sodium diet, +elevation, +support hose, & Dyazide... Hx Cellulitis >> Adm 1/15 by [facility] w/ bilat LE cellulitis; Ven Dopplers were neg for DVT; treated w/ Vanco IV & switched to Levaquin 750 at disch, resolved, no recurrence...  HYPERCHOLESTEROLEMIA (ICD-272.0) - on SIMVASTATIN 40mg /d + diet efforts... ~  [place] 7/08 showed TChol 156, TG 118, HDL 55, LDL 78 ~  FLP 7/09 showed TChol 159, TG 142, HDL 48, LDL 83... rec- continue same. ~  FLP 7/10 showed TChol 151, TG 81, HDL 59, LDL 76... continue Simva40. ~  [place] 1/11 showed TChol 155, TG 148, HDL 48, LDL 77 ~  FLP 7/11 showed TChol 147, TG 103, HDL 56, LDL 71... continue Simva40. ~  [place] 1/12 on Simva40 showed TChol 162, TG 110, HDL 60, LDL 80 ~  FLP 1/13 on Simva40 showed TChol 156, TG 92, HDL 60, LDL  78 ~  FLP 1/14 on Simva40 showed TChol 168, TG 154, HDL 57, LDL 80 ~  FLP 1/15 on Simva20 showed TChol 139, TG 96, HDL 50, LDL 70   HYPOTHYROIDISM (ICD-244.9) - on LEVOXYL 60mcg/d> s/p R thyroid lobectomy 2002 by DrCrossley for cyst...  ~  labs 1/09 on Levoxyl50 showed TSH= 3.10 ~  labs 7/09 on Levoxyl50 showed TSH= 2.34 ~  labs 1/10 showed TSH= 3.78... continue LEVOXYL50 ~  labs 7/10 showed TSH= 2.54 ~  labs 1/11 showed TSH= 2.95 ~  labs 7/11 showed TSH= 2.64 ~  labs 1/12 on Levo50 showed TSH= 3.59 ~  Labs 1/13 on Levo50 showed TSH= 4.95 ~  Labs 10/13 showed TSH= 4.37 & she is clinically euthyroid... ~  Labs 1/15 on Synthroid50 showed TSH= 2.82  OBESITY (ICD-278.00) -  she knows that she needs to diet, exercise and get her weight down! ~  weight 1/10 = 193#,  she is 4\' 10"  tall,  BMI= 40... ~  weight 7/10 = 194# ~  weight 1/11 = 188# ~  weight 7/11 = 191# ~  weight 1/12 = 186# ~  Weight 7/12 = 187# ~  Weight 1/13 = 186# ~  Weight 7/13 = 189# ~  Weight 10/13 = 179#... Good job w/ wt reduction! ~  Weight 1/14 = 193#... What happened? ~  Weight 7/14 = 188#... "I'm not dieting, I can't afford weight watchers" ~  Weight 1/15 = 183#  GERD (ICD-530.81) - she uses OTC Prilosec 20mg  Prn... she saw DrGessner in Feb09- doing satis... ~  [facility] records indicate she had an ENT eval by DrCrossley 5/12 w/ Ba swallow but we don't have his notes or the test results...  IRRITABLE BOWEL SYNDROME (ICD-564.1) &  COLONIC POLYPS (ICD-211.3) - colonoscopy 3/06 by DrSamLeBauer showed extremely tortuous colon, divertics, sm polyp... f/u 22yrs... ~  colonoscopy 3/11 by DrGessner showed divertics, stenosis in sigmoid, hems, & 2 sm polyps= adenoma fragments, f/u 2yrs. ~  She saw TP 11/13 for a gastroenteritis that resolved on it's own...  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 ~  labs 1/09 showed BUN= 15, Creat= 1.0 ~  labs 1/10 showed BUN= 18, Creat= 1.0 ~  labs 1/11 showed BUN= 16, Creat= 1.0 ~  labs 1/12 showed BUN= 19, Creat= 0.9  ~  8/12: she had f/u DrWebb> his note is reviewed, stable, no changes made. ~  Labs 1/13 showed BUN= 17, Creat= 1.0 ~  8/13: she continues to follow up w/ Nephrology, DrWebb (once per yr); BP controlled on meds, renal function normal, no changes made. ~  Labs 10/13 showed BUN= 15, Creat= 1.1 ~  5/14: she had Urology f/u DrBorden> sonar showed no change in the angiomyolipoma, he notes that pt continues to insist on 12mo f/u ultrasound exams... ~  9/14: she had Nephrology f/u DrWebb> good BP control & good renal function w/ one kidney...  ~  Labs 1/15 showed BUN= 16, Cr= 1.1  BENIGN NEOPLASM OF KIDNEY (ICD-223.0) - followed by DrBorden for ANGIOMYOLIPOMA (72mm right renal lesion- AML confirmed by MRI & followed by ultrasound exams) & renal cysts... she is seen every 27mo for sonars due to her severe anxiety about these benign lesions... ~  3/11: f/u DrBorden for 89mo sonar to eval angiomyolipoma right kidney- no change, reassured...  ~  11/11: f/u DrBorden w/ another sonar= no change AML or cyst;  ~  5/12:  routine 4mo f/u DrBorden w/ sonar> no change in 63mm AML right kidney, bilat renal cysts, sm right kidney... ~  11/12: f/u DrBorden for atrophic left kidney & 0.7cm AML right kidney; Sonar unchanged & she persists in wanting scans every 109mo. ~  5/13:  F/u DrBorden w/ atrophic left kidney & 0.7cm angiomyolipoma right kid; she requests sonar Q32m due to her extreme anxiety & need for constant reassurance- no ch on sonar. ~  11/13:  She was rechecked by DrBorden> sonar unchanged & she is reassured... ~  5/14: she continues to  f/u DrBorden Q1mo due to her anxiety over her atrophic left kidney and her 45mm angiomyolipoma of her right kidney; urine was clear 7 sonar unchanged...  DEGENERATIVE JOINT DISEASE (ICD-715.90) - she sees DrHilts for Ortho... LOW BACK PAIN, CHRONIC (ICD-724.2) - she tells me she is seeing a chiropractor, DrGibson, for "spinal decompression" & improved ~  She had f/u BMD 3/12 at [facility] w/ TScore -0.7 in FemNeck> normal... ~  Lumbar spine MRI 11/13 by DrHilts> mild multifactorial spinal stenosis L2-3, mod multifac sp stenosis L3-4 w/ left foraminal stenosis, mod multifac sp stenosis L4-5 w/ right foraminal stenosis. ~  BMD done at [facility] 3/14 showed lowest measured  TScore of -0.2 in right FemNeck... ~  7/14: she reports that she continues to see her Chiro- drGibson Q6wks "I'm on maintenance"  GOUT (ICD-274.9) - Uric Acid Jul09 was 8.1 and ALLOPURINOL 100mg /d started by DrWebb... ~  labs 7/10 showed Uric= 5.8 ~  labs 1/11 showed Uric= 5.7 ~  labs 1/12 showed Uric= 6.1  ANXIETY (ICD-300.00) - she has severe anxiety about her health status, doesn't want nerve pills. ~  10/13:  Episode of choking, couldn't get air "in", & running down the hall pounding her chest after a shower; Eval was neg- and rec to take KLONOPIN 0.5mg  1/2-1 Bid. ~  She reports that the Klonopin caused "crazy dreams" so she stopped it 7 this went away...  Health Maintenance - her GYN is ?(NP Orene Desanctis)... BMD at SER done 2/ 10 & improved, on calcium, vits, etc... ~  labs 7/09 showed Vit D level = 57 ~  labs 7/10 showed Vit D level = 46 ~  labs 7/11 showed Vit D level = 68 ~  Labs 1/13 showed Vit D level = 57   Past Surgical History  Procedure Laterality Date  . Hysterctomy and ooperectomy  1970's  . Thyroid lobectomy for a cyst  10/2000    Dr. Ernesto Rutherford  . Functional endoscopic sinus surgery  05/2003    Dr. Ernesto Rutherford    Outpatient Encounter Prescriptions as of 03/24/2013  Medication Sig  . allopurinol (ZYLOPRIM) 100 MG  tablet Take 100 mg by mouth daily.  Marland Kitchen amLODipine-benazepril (LOTREL) 5-10 MG per capsule Take 1 capsule by mouth daily.  Marland Kitchen aspirin 81 MG tablet Take 81 mg by mouth daily.    Marland Kitchen atenolol (TENORMIN) 100 MG tablet Take 100 mg by mouth daily.  . Calcium Carbonate-Vitamin D (CALTRATE 600+D) 600-400 MG-UNIT per tablet Take 1 tablet by mouth 2 (two) times daily.    . dorzolamide-timolol (COSOPT) 22.3-6.8 MG/ML ophthalmic solution Place 1 drop into both eyes 2 (two) times daily.  . Guaifenesin (MUCINEX MAXIMUM STRENGTH) 1200 MG TB12 Take 1 tablet by mouth as needed.   . latanoprost (XALATAN) 0.005 % ophthalmic solution Place 1 drop into the right eye at bedtime.    Marland Kitchen levothyroxine (SYNTHROID, LEVOTHROID) 50 MCG tablet Take 50 mcg by mouth daily before breakfast.  . Multiple Vitamins-Minerals (CENTRUM SILVER PO) Take 1 tablet by mouth daily.    . potassium chloride (MICRO-K) 10 MEQ CR capsule Take 10 mEq by mouth 2 (two) times daily.  . simvastatin (ZOCOR) 20 MG tablet Take 1 tablet (20 mg total) by mouth every evening.  . triamterene-hydrochlorothiazide (DYAZIDE) 37.5-25 MG per capsule Take 1 each (1 capsule total) by mouth daily.    Allergies  Allergen Reactions  . Amlodipine Besylate     Causes swelling  . Clonidine     Sweating, nausea, felt faint  . Diltiazem Hcl     meds did not work for her----causes swelling  . Doxycycline Hyclate     REACTION:  tetracycline  . Iodine     IVP contrast  . Labetalol Hcl   . Olmesartan Medoxomil   . Penicillins     Pt not allergic--just causes yeast infections    Current Medications, Allergies, Past Medical History, Past Surgical History, Family History, and Social History were reviewed in [employer] record.   Review of Systems        See HPI - all other systems neg except as noted...  The patient complains of dyspnea on exertion.  The patient  denies anorexia, fever, weight loss, weight gain, vision loss, decreased hearing,  hoarseness, chest pain, syncope, peripheral edema, prolonged cough, headaches, hemoptysis, abdominal pain, melena, hematochezia, severe indigestion/heartburn, hematuria, incontinence, muscle weakness, suspicious skin lesions, transient blindness, difficulty walking, depression, unusual weight change, abnormal bleeding, enlarged lymph nodes, and angioedema.     Objective:   Physical Exam     WD, Obese, 80 y/o BF in NAD... GENERAL:  Alert & oriented; pleasant & cooperative... HEENT:  [place]/AT, EOM-wnl, PERRLA, EACs-clear, TMs-wnl, NOSE-clear, THROAT-clear & wnl. NECK:  Supple w/ fairROM; no JVD; normal carotid impulses w/o bruits; Thyroid surg scar, no thyromegaly or nodules palpated; no lymphadenopathy. CHEST:  Clear to P & A; without wheezes/ rales/ or rhonchi heard... HEART:  Regular Rhythm; without murmurs/ rubs/ or gallops detected... ABDOMEN:  Obese soft & nontender; normal bowel sounds; no organomegaly or masses palpated... EXT: without deformities, mild arthritic changes; no varicose veins/ venous insuffic/ tr edema. NEURO:  CN's intact; no focal neuro deficits... DERM:  No lesions noted; no rash etc...  RADIOLOGY DATA:  Reviewed in the EPIC EMR & discussed w/ the patient...    >>CXR 1/13 showed borderline heart size, clear lungs, TSpine w/ mild spondylosis...  LABORATORY DATA:  Reviewed in the EPIC EMR & discussed w/ the patient...    >>LABS 1/13 revealed FLP-ok, Chems-ok, CBC-ok, TSH-ok, VitD-57...   Assessment & Plan:    ?Cricopharyngeus dysfunction, choking, dyspnea, anxiety>  Eval is neg as above & tried Klonopin but she stopped due to "crazy dreams"...  OSA>  Followed by DrClance yearly; not wearing CPAP compliantly, no mask issues except when "congested", no daytime hypersomnolence etc...  HBP>  Controlled on meds as listed, continue same...  Hx AtypicCP & mild cerebrovasc plaque>  On ASA w/o ischemic symptoms; CDopplers are neg...  CHOL>  Stable on diet + Simva40, needs  to lose weight!  Hypothyroid>  On Levoxyl 95mcg/d, continue same...  GI>  GERD, IBS, Colon Polyps>  Followed by DrGessner, stable on OTC Prilosec but doesn't want antispasmotic etc...  GU>  Atrophic right kidney, sm renal cysts, left angiomyolipoma, normal renal function> Followed by DrBorden every 14mo & also sees DrWebb periodically as well...  DJD/ GOUT>  Stable w/o acute gout attacks...  Anxiety>  She still declines anxiolytic Rx, states she was ultimately intol to the Klonopin as well.Marland KitchenMarland Kitchen

## 2013-04-04 ENCOUNTER — Ambulatory Visit (INDEPENDENT_AMBULATORY_CARE_PROVIDER_SITE_OTHER): Payer: Medicare Other | Admitting: Internal Medicine

## 2013-04-04 ENCOUNTER — Encounter: Payer: Self-pay | Admitting: Internal Medicine

## 2013-04-04 VITALS — BP 120/72 | HR 65 | Temp 97.4°F | Ht 59.5 in | Wt 182.4 lb

## 2013-04-04 DIAGNOSIS — L03119 Cellulitis of unspecified part of limb: Secondary | ICD-10-CM

## 2013-04-04 DIAGNOSIS — E669 Obesity, unspecified: Secondary | ICD-10-CM

## 2013-04-04 DIAGNOSIS — G4733 Obstructive sleep apnea (adult) (pediatric): Secondary | ICD-10-CM

## 2013-04-04 DIAGNOSIS — I10 Essential (primary) hypertension: Secondary | ICD-10-CM

## 2013-04-04 DIAGNOSIS — E7800 Pure hypercholesterolemia, unspecified: Secondary | ICD-10-CM

## 2013-04-04 DIAGNOSIS — E039 Hypothyroidism, unspecified: Secondary | ICD-10-CM

## 2013-04-04 DIAGNOSIS — L02419 Cutaneous abscess of limb, unspecified: Secondary | ICD-10-CM

## 2013-04-04 NOTE — Assessment & Plan Note (Signed)
Reviewed variable compliance with CPAP due to various issues (dry mouth, mask fit?) Follows with sleep specialist Dr. Gwenette Greet annually for same Reviewed relationship of obesity to same, encourage to work on weight reduction for management of this issue and followup with sleep as planned

## 2013-04-04 NOTE — Progress Notes (Signed)
Subjective:    Patient ID: April Donaldson, female    DOB: 02-02-1932, 80 y.o.   MRN: [id number]  HPI  Patient is new to me, transfer from [place] to establish with new PCP  Reviewed chronic medical issues and interval medical events:  OSA ([facility]) -CPAP prescribe since 2009, variable compliance with same -   HTN/ atyp CP (Nishan) - the patient reports compliance with medication(s) as prescribed. Denies adverse side effects.  Dyslipidemia - on statin, dose reduced 02/2013 due to coadmin with amlodipine -   Hypothyroid, post surgical since 2002 partial thyroidectomy for cyst. the patient reports compliance with medication(s) as prescribed. Denies adverse side effects.  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 Glaucoma   . OSA (obstructive sleep apnea)     NPSG 2009: AHI 20/h. CPAP - follows with [facility]  . Hypertension   . Cerebrovascular disease   . Venous insufficiency   . Hypercholesteremia   . Hypothyroid   . Obesity   . IBS (irritable bowel syndrome)   . GERD (gastroesophageal reflux disease)   . Hx of colonic polyps   . Renal insufficiency   . Benign neoplasm of kidney, except pelvis   . DJD (degenerative joint disease)   . Chronic low back pain   . Gout   . Anxiety   . Cellulitis of leg 03/09/2013 hosp    Family History  Problem Relation Age of Onset  . Heart disease Mother   . Asthma Brother   . Throat cancer Brother 43  . Asthma Sister   . Brain cancer Sister 42  . Breast cancer Sister 76   History  Substance Use  Topics  . Smoking status: Never Smoker   . Smokeless tobacco: Not on file  . Alcohol Use: No    Review of Systems  Constitutional: Negative for fatigue and unexpected weight change.  Respiratory: Negative for cough, shortness of breath and wheezing.   Cardiovascular: Negative for chest pain, palpitations and leg swelling.  Gastrointestinal: Negative for nausea, abdominal pain and diarrhea.  Neurological: Negative for dizziness, weakness, light-headedness and headaches.  Psychiatric/Behavioral: Negative for dysphoric mood. The patient is not nervous/anxious.   All other systems reviewed and are negative.       Objective:   Physical Exam  BP 120/72  Pulse 65  Temp(Src) 97.4 F (36.3 C) (Oral)  Ht 4' 11.5" (1.511 m)  Wt 182 lb 6.4 oz (82.736 kg)  BMI 36.24 kg/m2  SpO2 97% Wt Readings from Last 3 Encounters:  04/04/13 182 lb 6.4 oz (82.736 kg)  03/24/13 183 lb 6.4 oz (83.19 kg)  03/10/13 188 lb 7.9 oz (85.5 kg)    Constitutional: She is obese, but appears well-developed and well-nourished. No distress.  Eyes: wears tinted sunglasses throughout visit. Vision grossly intact HENT: NCAT, no sinus tenderness - OP clear without erythema or exudate Neck: Normal range of motion. Neck supple. No JVD present. Well healed thyroidectomy scar -no nodules or thyromegaly present.  Cardiovascular: Normal rate, regular rhythm and normal heart sounds.  No murmur heard. wearing compression  hose knee high BLE - trace ankle edema (chronic). Pulmonary/Chest: Effort normal and breath sounds normal. No respiratory distress. She has no wheezes.  Abdomen: SNTND, +BS MSkel: OA changes, no effusions Skin - no abnormal erythema or warmth, no lesions or rash Psychiatric: She has a normal mood and affect. Her behavior is normal. Judgment and thought content normal.   Lab Results  Component Value Date   WBC 11.0* 03/24/2013   HGB 12.2 03/24/2013   HCT 37.1 03/24/2013   PLT 387.0 03/24/2013   GLUCOSE 82  03/24/2013   CHOL 139 03/24/2013   TRIG 96.0 03/24/2013   HDL 49.60 03/24/2013   LDLCALC 70 03/24/2013   ALT 19 03/24/2013   AST 19 03/24/2013   NA 141 03/24/2013   K 4.0 03/24/2013   CL 104 03/24/2013   CREATININE 1.1 03/24/2013   BUN 16 03/24/2013   CO2 28 03/24/2013   TSH 2.82 03/24/2013    Dg Ankle Complete Left  03/09/2013   CLINICAL DATA:  Leg swelling without injury  EXAM: LEFT ANKLE COMPLETE - 3+ VIEW  COMPARISON:  None.  FINDINGS: Generalized soft tissue swelling is seen. No acute fracture or dislocation is noted.  IMPRESSION: Soft tissue swelling without acute bony abnormality.   Electronically Signed   By: Inez Catalina M.D.   On: 03/09/2013 19:45   Dg Ankle Complete Right  03/09/2013   CLINICAL DATA:  Leg swelling  EXAM: RIGHT ANKLE - COMPLETE 3+ VIEW  COMPARISON:  None.  FINDINGS: Generalized soft tissue swelling is noted. No acute fracture or dislocation is seen.  IMPRESSION: Soft tissue swelling without acute bony abnormality.   Electronically Signed   By: Inez Catalina M.D.   On: 03/09/2013 19:45       Assessment & Plan:   Medications and labs reviewed today.  Problem List Items Addressed This Visit   HYPERCHOLESTEROLEMIA     Simvastatin dose reduced 02/2013 from 40 mg daily to 20 mg daily because of co administration with amlodipine Last lipids at goal, follow semi-annually and titrate or change statin as needed The current medical regimen is effective;  continue present plan and medications.     HYPERTENSION      BP Readings from Last 3 Encounters:  04/04/13 120/72  03/24/13 124/68  03/13/13 162/78   The current medical regimen is effective;  continue present plan and medications.     HYPOTHYROIDISM     History of partial thyroidectomy 2002 for cysts reviewed On Synthroid replacement for same Last labs reviewed, check q. 6-12 months, titrate as needed The current medical regimen is effective;  continue present plan and medications.     OBESITY      Wt Readings  from Last 3 Encounters:  04/04/13 182 lb 6.4 oz (82.736 kg)  03/24/13 183 lb 6.4 oz (83.19 kg)  03/10/13 188 lb 7.9 oz (85.5 kg)   The patient is asked to make an attempt to improve diet and exercise patterns to aid in medical management of this problem.     OBSTRUCTIVE SLEEP APNEA     Reviewed variable compliance with CPAP due to various issues (dry mouth, mask fit?) Follows with sleep specialist Dr. Gwenette Greet annually for same Reviewed relationship of obesity to same, encourage to work on weight reduction for management of this issue and followup with sleep as planned     Other Visit Diagnoses   Cellulitis of leg    -  Primary      Time spent with pt  today 45 minutes, greater than 50% time spent counseling patient on recent hospitalization for cellulitis, obesity, thyroid disease, sleep apnea, lipid and blood pressure management and medication review. Also review of prior records

## 2013-04-04 NOTE — Assessment & Plan Note (Signed)
Wt Readings from Last 3 Encounters:  04/04/13 182 lb 6.4 oz (82.736 kg)  03/24/13 183 lb 6.4 oz (83.19 kg)  03/10/13 188 lb 7.9 oz (85.5 kg)   The patient is asked to make an attempt to improve diet and exercise patterns to aid in medical management of this problem.

## 2013-04-04 NOTE — Assessment & Plan Note (Signed)
BP Readings from Last 3 Encounters:  04/04/13 120/72  03/24/13 124/68  03/13/13 162/78   The current medical regimen is effective;  continue present plan and medications.

## 2013-04-04 NOTE — Assessment & Plan Note (Signed)
Simvastatin dose reduced 02/2013 from 40 mg daily to 20 mg daily because of co administration with amlodipine Last lipids at goal, follow semi-annually and titrate or change statin as needed The current medical regimen is effective;  continue present plan and medications.  

## 2013-04-04 NOTE — Patient Instructions (Addendum)
It was good to see you today.  We have reviewed your prior records including labs and tests today  Health Maintenance reviewed - consider Shingles vaccination as discussed -all other recommended immunizations and age-appropriate screenings are up-to-date.  Medications reviewed and updated, no changes recommended at this time.  Please schedule followup in 6 months for annual exam and labs, call sooner if problems.  Exercise to Lose Weight Exercise and a healthy diet may help you lose weight. Your doctor may suggest specific exercises. EXERCISE IDEAS AND TIPS  Choose low-cost things you enjoy doing, such as walking, bicycling, or exercising to workout videos.  Take stairs instead of the elevator.  Walk during your lunch break.  Park your car further away from work or school.  Go to a gym or an exercise class.  Start with 5 to 10 minutes of exercise each day. Build up to 30 minutes of exercise 4 to 6 days a week.  Wear shoes with good support and comfortable clothes.  Stretch before and after working out.  Work out until you breathe harder and your heart beats faster.  Drink extra water when you exercise.  Do not do so much that you hurt yourself, feel dizzy, or get very short of breath. Exercises that burn about 150 calories:  Running 1  miles in 15 minutes.  Playing volleyball for 45 to 60 minutes.  Washing and waxing a car for 45 to 60 minutes.  Playing touch football for 45 minutes.  Walking 1  miles in 35 minutes.  Pushing a stroller 1  miles in 30 minutes.  Playing basketball for 30 minutes.  Raking leaves for 30 minutes.  Bicycling 5 miles in 30 minutes.  Walking 2 miles in 30 minutes.  Dancing for 30 minutes.  Shoveling snow for 15 minutes.  Swimming laps for 20 minutes.  Walking up stairs for 15 minutes.  Bicycling 4 miles in 15 minutes.  Gardening for 30 to 45 minutes.  Jumping rope for 15 minutes.  Washing windows or floors for 45 to  60 minutes. Document Released: 03/18/2010 Document Revised: 05/08/2011 Document Reviewed: 03/18/2010 [facility] Patient Information 2014 [place], [place].

## 2013-04-04 NOTE — Assessment & Plan Note (Signed)
History of partial thyroidectomy 2002 for cysts reviewed On Synthroid replacement for same Last labs reviewed, check q. 6-12 months, titrate as needed The current medical regimen is effective;  continue present plan and medications. 

## 2013-04-04 NOTE — Assessment & Plan Note (Signed)
Hospitalization for same January 2015 reviewed, symptoms have since resolved Recheck labs show normalization of white count Chronic venous insufficiency also reviewed, patient encouraged to comply with daily compression hose for same

## 2013-04-04 NOTE — Progress Notes (Signed)
Pre-visit discussion using our clinic review tool. No additional management support is needed unless otherwise documented below in the visit note.  

## 2013-04-14 ENCOUNTER — Other Ambulatory Visit: Payer: Self-pay | Admitting: Pulmonary Disease

## 2013-04-16 ENCOUNTER — Other Ambulatory Visit: Payer: Self-pay | Admitting: Pulmonary Disease

## 2013-05-06 ENCOUNTER — Encounter: Payer: Self-pay | Admitting: Internal Medicine

## 2013-05-06 LAB — HM MAMMOGRAPHY

## 2013-06-02 ENCOUNTER — Ambulatory Visit (INDEPENDENT_AMBULATORY_CARE_PROVIDER_SITE_OTHER): Payer: Medicare Other | Admitting: Pulmonary Disease

## 2013-06-02 ENCOUNTER — Encounter: Payer: Self-pay | Admitting: Pulmonary Disease

## 2013-06-02 ENCOUNTER — Telehealth: Payer: Self-pay | Admitting: Internal Medicine

## 2013-06-02 VITALS — BP 118/70 | HR 53 | Temp 97.5°F | Ht 59.0 in | Wt 184.0 lb

## 2013-06-02 DIAGNOSIS — G4733 Obstructive sleep apnea (adult) (pediatric): Secondary | ICD-10-CM

## 2013-06-02 MED ORDER — TRIAMTERENE-HCTZ 37.5-25 MG PO CAPS: ORAL_CAPSULE | ORAL | Status: DC

## 2013-06-02 MED ORDER — POTASSIUM CHLORIDE ER 10 MEQ PO CPCR: 10.0000 meq | ORAL_CAPSULE | Freq: Two times a day (BID) | ORAL | Status: DC

## 2013-06-02 MED ORDER — LEVOTHYROXINE SODIUM 50 MCG PO TABS: 50.0000 ug | ORAL_TABLET | Freq: Every day | ORAL | Status: DC

## 2013-06-02 MED ORDER — SIMVASTATIN 20 MG PO TABS: 20.0000 mg | ORAL_TABLET | Freq: Every evening | ORAL | Status: DC

## 2013-06-02 MED ORDER — ALLOPURINOL 100 MG PO TABS: ORAL_TABLET | ORAL | Status: DC

## 2013-06-02 MED ORDER — ATENOLOL 100 MG PO TABS: 100.0000 mg | ORAL_TABLET | Freq: Every day | ORAL | Status: DC

## 2013-06-02 NOTE — Progress Notes (Signed)
   Subjective:    Patient ID: April Donaldson, female    DOB: [birth year], 80 y.o.   MRN: [id number]  HPI Patient comes in today for followup of her obstructive sleep apnea. She is wearing CPAP compliantly, and has no issues with the pressure. She continues to have issues with mouth opening despite using a chin strap, and is willing to look at some of the newer full face masks. She does feel the CPAP has helped her considerably, and is satisfied with her daytime alertness.   Review of Systems  Constitutional: Negative for fever and unexpected weight change.  HENT: Negative for congestion, dental problem, ear pain, nosebleeds, postnasal drip, rhinorrhea, sinus pressure, sneezing, sore throat and trouble swallowing.        Dry mouth  Eyes: Negative for redness and itching.  Respiratory: Negative for cough, chest tightness, shortness of breath and wheezing.   Cardiovascular: Negative for palpitations and leg swelling.  Gastrointestinal: Negative for nausea and vomiting.  Genitourinary: Negative for dysuria.  Musculoskeletal: Negative for joint swelling.  Skin: Negative for rash.  Neurological: Negative for headaches.  Hematological: Does not bruise/bleed easily.  Psychiatric/Behavioral: Negative for dysphoric mood. The patient is not nervous/anxious.        Objective:   Physical Exam Overweight female in no acute distress Nose without purulence or discharge noted No skin breakdown or pressure necrosis from the CPAP mask Neck without lymphadenopathy or thyromegaly Lower extremities with mild edema, no cyanosis Alert, does not appear to be sleepy, moves all 4 extremities.       Assessment & Plan:

## 2013-06-02 NOTE — Patient Instructions (Signed)
Will send an order to your homecare company to show you the deluxe chin strap (if you do not already have this).   Look at some of the newer full face masks when due for mask change. Keep working on weight loss followup with me again in one year, but call if having issues.

## 2013-06-02 NOTE — Telephone Encounter (Signed)
Patient would like refills on atenolol 100 mg and simvastatin 20 mg sent to [facility]. Patient would like refills on allopurinol 100 mg, levothyroxine 50 mcg, potassium chloride 10 meq cr and triameterene-hydrochlorothiazide 37.5-25mg  sent to [facility] on [address]

## 2013-06-02 NOTE — Assessment & Plan Note (Signed)
The patient overall is doing fairly well with CPAP, and feels that it continues to help her sleep and daytime alertness. She is having issues with mouth opening, and is not doing well with the chin strap. I will see if she has the Tylox chin strap, and I've also asked her to consider trying some of the newer full face masks.  I have also asked her to work aggressively on weight loss.

## 2013-06-02 NOTE — Telephone Encounter (Signed)
Inform pt all meds sent to pharmacy's.../lm,b

## 2013-06-09 ENCOUNTER — Other Ambulatory Visit: Payer: Self-pay | Admitting: Pulmonary Disease

## 2013-09-15 ENCOUNTER — Encounter (HOSPITAL_BASED_OUTPATIENT_CLINIC_OR_DEPARTMENT_OTHER)
Admission: RE | Admit: 2013-09-15 | Discharge: 2013-09-15 | Disposition: A | Discharge status: Home / Self Care | Payer: Medicare Other | Source: Ambulatory Visit | Attending: Orthopedic Surgery | Admitting: Orthopedic Surgery

## 2013-09-15 ENCOUNTER — Other Ambulatory Visit: Payer: Self-pay

## 2013-09-15 ENCOUNTER — Encounter (HOSPITAL_BASED_OUTPATIENT_CLINIC_OR_DEPARTMENT_OTHER): Payer: Self-pay | Admitting: *Deleted

## 2013-09-15 DIAGNOSIS — R229 Localized swelling, mass and lump, unspecified: Secondary | ICD-10-CM | POA: Diagnosis present

## 2013-09-15 DIAGNOSIS — I10 Essential (primary) hypertension: Secondary | ICD-10-CM | POA: Diagnosis not present

## 2013-09-15 DIAGNOSIS — F411 Generalized anxiety disorder: Secondary | ICD-10-CM | POA: Diagnosis not present

## 2013-09-15 DIAGNOSIS — K449 Diaphragmatic hernia without obstruction or gangrene: Secondary | ICD-10-CM | POA: Diagnosis not present

## 2013-09-15 DIAGNOSIS — Z01812 Encounter for preprocedural laboratory examination: Secondary | ICD-10-CM | POA: Diagnosis not present

## 2013-09-15 DIAGNOSIS — G4733 Obstructive sleep apnea (adult) (pediatric): Secondary | ICD-10-CM | POA: Diagnosis not present

## 2013-09-15 DIAGNOSIS — Z6837 Body mass index (BMI) 37.0-37.9, adult: Secondary | ICD-10-CM | POA: Diagnosis not present

## 2013-09-15 DIAGNOSIS — Z01810 Encounter for preprocedural cardiovascular examination: Secondary | ICD-10-CM | POA: Diagnosis not present

## 2013-09-15 DIAGNOSIS — N289 Disorder of kidney and ureter, unspecified: Secondary | ICD-10-CM | POA: Diagnosis not present

## 2013-09-15 DIAGNOSIS — Z7982 Long term (current) use of aspirin: Secondary | ICD-10-CM | POA: Diagnosis not present

## 2013-09-15 DIAGNOSIS — K219 Gastro-esophageal reflux disease without esophagitis: Secondary | ICD-10-CM | POA: Diagnosis not present

## 2013-09-15 DIAGNOSIS — K589 Irritable bowel syndrome without diarrhea: Secondary | ICD-10-CM | POA: Diagnosis not present

## 2013-09-15 DIAGNOSIS — D2110 Benign neoplasm of connective and other soft tissue of unspecified upper limb, including shoulder: Secondary | ICD-10-CM | POA: Diagnosis not present

## 2013-09-15 DIAGNOSIS — E039 Hypothyroidism, unspecified: Secondary | ICD-10-CM | POA: Diagnosis not present

## 2013-09-15 LAB — BASIC METABOLIC PANEL
Anion gap: 14 (ref 5–15)
BUN: 18 mg/dL (ref 6–23)
CHLORIDE: 102 meq/L (ref 96–112)
CO2: 25 mEq/L (ref 19–32)
Calcium: 10.3 mg/dL (ref 8.4–10.5)
Creatinine, Ser: 1.03 mg/dL (ref 0.50–1.10)
GFR calc Af Amer: 58 mL/min — ABNORMAL LOW (ref >90)
GFR calc non Af Amer: 50 mL/min — ABNORMAL LOW (ref >90)
GLUCOSE: 97 mg/dL (ref 70–99)
POTASSIUM: 3.9 meq/L (ref 3.7–5.3)
Sodium: 141 mEq/L (ref 137–147)

## 2013-09-15 NOTE — Progress Notes (Signed)
To come in for bmet-ekg-no cardiac-did have a stress echo 2010-normal Uses a cpap-will bring it

## 2013-09-16 ENCOUNTER — Encounter (HOSPITAL_BASED_OUTPATIENT_CLINIC_OR_DEPARTMENT_OTHER): Payer: Self-pay | Admitting: *Deleted

## 2013-09-17 ENCOUNTER — Encounter (HOSPITAL_BASED_OUTPATIENT_CLINIC_OR_DEPARTMENT_OTHER)
Admission: RE | Disposition: A | Discharge status: Home / Self Care | Payer: Self-pay | Source: Ambulatory Visit | Attending: Orthopedic Surgery

## 2013-09-17 ENCOUNTER — Encounter (HOSPITAL_BASED_OUTPATIENT_CLINIC_OR_DEPARTMENT_OTHER): Payer: Medicare Other | Admitting: Anesthesiology

## 2013-09-17 ENCOUNTER — Ambulatory Visit (HOSPITAL_BASED_OUTPATIENT_CLINIC_OR_DEPARTMENT_OTHER)
Admission: RE | Admit: 2013-09-17 | Discharge: 2013-09-17 | Disposition: A | Discharge status: Home / Self Care | Payer: Medicare Other | Source: Ambulatory Visit | Attending: Orthopedic Surgery | Admitting: Orthopedic Surgery

## 2013-09-17 ENCOUNTER — Ambulatory Visit (HOSPITAL_BASED_OUTPATIENT_CLINIC_OR_DEPARTMENT_OTHER): Payer: Medicare Other | Admitting: Anesthesiology

## 2013-09-17 ENCOUNTER — Other Ambulatory Visit: Payer: Self-pay | Admitting: Orthopedic Surgery

## 2013-09-17 ENCOUNTER — Encounter (HOSPITAL_BASED_OUTPATIENT_CLINIC_OR_DEPARTMENT_OTHER): Payer: Self-pay | Admitting: Anesthesiology

## 2013-09-17 DIAGNOSIS — Z6837 Body mass index (BMI) 37.0-37.9, adult: Secondary | ICD-10-CM | POA: Insufficient documentation

## 2013-09-17 DIAGNOSIS — K449 Diaphragmatic hernia without obstruction or gangrene: Secondary | ICD-10-CM | POA: Insufficient documentation

## 2013-09-17 DIAGNOSIS — N289 Disorder of kidney and ureter, unspecified: Secondary | ICD-10-CM | POA: Insufficient documentation

## 2013-09-17 DIAGNOSIS — Z01810 Encounter for preprocedural cardiovascular examination: Secondary | ICD-10-CM | POA: Insufficient documentation

## 2013-09-17 DIAGNOSIS — Z01812 Encounter for preprocedural laboratory examination: Secondary | ICD-10-CM | POA: Diagnosis not present

## 2013-09-17 DIAGNOSIS — D2110 Benign neoplasm of connective and other soft tissue of unspecified upper limb, including shoulder: Secondary | ICD-10-CM | POA: Insufficient documentation

## 2013-09-17 DIAGNOSIS — I10 Essential (primary) hypertension: Secondary | ICD-10-CM | POA: Insufficient documentation

## 2013-09-17 DIAGNOSIS — K589 Irritable bowel syndrome without diarrhea: Secondary | ICD-10-CM | POA: Insufficient documentation

## 2013-09-17 DIAGNOSIS — D4819 Other specified neoplasm of uncertain behavior of connective and other soft tissue: Secondary | ICD-10-CM

## 2013-09-17 DIAGNOSIS — Z7982 Long term (current) use of aspirin: Secondary | ICD-10-CM | POA: Insufficient documentation

## 2013-09-17 DIAGNOSIS — G4733 Obstructive sleep apnea (adult) (pediatric): Secondary | ICD-10-CM | POA: Insufficient documentation

## 2013-09-17 DIAGNOSIS — K219 Gastro-esophageal reflux disease without esophagitis: Secondary | ICD-10-CM | POA: Insufficient documentation

## 2013-09-17 DIAGNOSIS — D481 Neoplasm of uncertain behavior of connective and other soft tissue: Secondary | ICD-10-CM

## 2013-09-17 DIAGNOSIS — F411 Generalized anxiety disorder: Secondary | ICD-10-CM | POA: Insufficient documentation

## 2013-09-17 DIAGNOSIS — E039 Hypothyroidism, unspecified: Secondary | ICD-10-CM | POA: Insufficient documentation

## 2013-09-17 HISTORY — DX: Personal history of other diseases of the digestive system: Z87.19

## 2013-09-17 HISTORY — PX: MASS EXCISION: SHX2000

## 2013-09-17 LAB — POCT HEMOGLOBIN-HEMACUE: Hemoglobin: 11.9 g/dL — ABNORMAL LOW (ref 12.0–15.0)

## 2013-09-17 SURGERY — EXCISION MASS
Anesthesia: General | Site: Hand | Laterality: Left

## 2013-09-17 MED ORDER — BUPIVACAINE HCL (PF) 0.25 % IJ SOLN
INTRAMUSCULAR | Status: AC
  Filled 2013-09-17: qty 30

## 2013-09-17 MED ORDER — OXYCODONE HCL 5 MG PO TABS: 5.0000 mg | ORAL_TABLET | Freq: Once | ORAL | Status: DC | PRN

## 2013-09-17 MED ORDER — FENTANYL CITRATE 0.05 MG/ML IJ SOLN
INTRAMUSCULAR | Status: DC | PRN
  Administered 2013-09-17 (×2): 50 ug via INTRAVENOUS

## 2013-09-17 MED ORDER — MIDAZOLAM HCL 2 MG/2ML IJ SOLN: 1.0000 mg | INTRAMUSCULAR | Status: DC | PRN

## 2013-09-17 MED ORDER — VANCOMYCIN HCL IN DEXTROSE 1-5 GM/200ML-% IV SOLN
1000.0000 mg | INTRAVENOUS | Status: AC
  Administered 2013-09-17: 1000 mg via INTRAVENOUS

## 2013-09-17 MED ORDER — BUPIVACAINE HCL (PF) 0.25 % IJ SOLN
INTRAMUSCULAR | Status: DC | PRN
  Administered 2013-09-17: 4 mL

## 2013-09-17 MED ORDER — METOCLOPRAMIDE HCL 5 MG/ML IJ SOLN: 10.0000 mg | Freq: Once | INTRAMUSCULAR | Status: DC | PRN

## 2013-09-17 MED ORDER — LIDOCAINE HCL (CARDIAC) 20 MG/ML IV SOLN
INTRAVENOUS | Status: DC | PRN
  Administered 2013-09-17: 75 mg via INTRAVENOUS

## 2013-09-17 MED ORDER — HYDROMORPHONE HCL PF 1 MG/ML IJ SOLN: 0.2500 mg | INTRAMUSCULAR | Status: DC | PRN

## 2013-09-17 MED ORDER — CHLORHEXIDINE GLUCONATE 4 % EX LIQD: 60.0000 mL | Freq: Once | CUTANEOUS | Status: DC

## 2013-09-17 MED ORDER — VANCOMYCIN HCL IN DEXTROSE 1-5 GM/200ML-% IV SOLN
INTRAVENOUS | Status: AC
  Filled 2013-09-17: qty 200

## 2013-09-17 MED ORDER — FENTANYL CITRATE 0.05 MG/ML IJ SOLN: 50.0000 ug | INTRAMUSCULAR | Status: DC | PRN

## 2013-09-17 MED ORDER — SUCCINYLCHOLINE CHLORIDE 20 MG/ML IJ SOLN
INTRAMUSCULAR | Status: DC | PRN
  Administered 2013-09-17: 100 mg via INTRAVENOUS

## 2013-09-17 MED ORDER — LACTATED RINGERS IV SOLN
INTRAVENOUS | Status: DC
  Administered 2013-09-17: 12:00:00 via INTRAVENOUS

## 2013-09-17 MED ORDER — OXYCODONE HCL 5 MG/5ML PO SOLN: 5.0000 mg | Freq: Once | ORAL | Status: DC | PRN

## 2013-09-17 MED ORDER — PROPOFOL 10 MG/ML IV BOLUS
INTRAVENOUS | Status: DC | PRN
  Administered 2013-09-17: 170 mg via INTRAVENOUS

## 2013-09-17 MED ORDER — OXYCODONE-ACETAMINOPHEN 5-325 MG PO TABS: 1.0000 | ORAL_TABLET | ORAL | Status: DC | PRN

## 2013-09-17 MED ORDER — FENTANYL CITRATE 0.05 MG/ML IJ SOLN
INTRAMUSCULAR | Status: AC
  Filled 2013-09-17: qty 4

## 2013-09-17 SURGICAL SUPPLY — 48 items
APL SKNCLS STERI-STRIP NONHPOA (GAUZE/BANDAGES/DRESSINGS)
BAG DECANTER FOR FLEXI CONT (MISCELLANEOUS) IMPLANT
BANDAGE ELASTIC 3 VELCRO ST LF (GAUZE/BANDAGES/DRESSINGS) ×3 IMPLANT
BANDAGE ELASTIC 4 VELCRO ST LF (GAUZE/BANDAGES/DRESSINGS) ×3 IMPLANT
BENZOIN TINCTURE PRP APPL 2/3 (GAUZE/BANDAGES/DRESSINGS) IMPLANT
BLADE SURG 15 STRL LF DISP TIS (BLADE) ×1 IMPLANT
BLADE SURG 15 STRL SS (BLADE) ×3
BNDG CMPR 9X4 STRL LF SNTH (GAUZE/BANDAGES/DRESSINGS) ×1
BNDG ESMARK 4X9 LF (GAUZE/BANDAGES/DRESSINGS) ×3 IMPLANT
BNDG GAUZE ELAST 4 BULKY (GAUZE/BANDAGES/DRESSINGS) ×3 IMPLANT
CLOSURE WOUND 1/2 X4 (GAUZE/BANDAGES/DRESSINGS)
CORDS BIPOLAR (ELECTRODE) ×3 IMPLANT
COVER TABLE BACK 60X90 (DRAPES) ×3 IMPLANT
CUFF TOURNIQUET SINGLE 18IN (TOURNIQUET CUFF) ×3 IMPLANT
DECANTER SPIKE VIAL GLASS SM (MISCELLANEOUS) IMPLANT
DRAPE EXTREMITY T 121X128X90 (DRAPE) ×3 IMPLANT
DRAPE SURG 17X23 STRL (DRAPES) ×3 IMPLANT
DURAPREP 26ML APPLICATOR (WOUND CARE) ×3 IMPLANT
GAUZE SPONGE 4X4 12PLY STRL (GAUZE/BANDAGES/DRESSINGS) ×3 IMPLANT
GAUZE XEROFORM 1X8 LF (GAUZE/BANDAGES/DRESSINGS) ×3 IMPLANT
GLOVE BIOGEL PI IND STRL 7.0 (GLOVE) ×1 IMPLANT
GLOVE BIOGEL PI INDICATOR 7.0 (GLOVE) ×2
GLOVE ECLIPSE 6.5 STRL STRAW (GLOVE) ×3 IMPLANT
GLOVE SURG SYN 8.0 (GLOVE) ×6 IMPLANT
GOWN STRL REUS W/ TWL LRG LVL3 (GOWN DISPOSABLE) ×1 IMPLANT
GOWN STRL REUS W/TWL LRG LVL3 (GOWN DISPOSABLE) ×3
GOWN STRL REUS W/TWL XL LVL3 (GOWN DISPOSABLE) ×3 IMPLANT
NEEDLE HYPO 25X1 1.5 SAFETY (NEEDLE) ×3 IMPLANT
NS IRRIG 1000ML POUR BTL (IV SOLUTION) ×3 IMPLANT
PACK BASIN DAY SURGERY FS (CUSTOM PROCEDURE TRAY) ×3 IMPLANT
PAD CAST 3X4 CTTN HI CHSV (CAST SUPPLIES) ×1 IMPLANT
PADDING CAST COTTON 3X4 STRL (CAST SUPPLIES) ×3
SHEET MEDIUM DRAPE 40X70 STRL (DRAPES) ×3 IMPLANT
SPLINT PLASTER CAST XFAST 4X15 (CAST SUPPLIES) ×15 IMPLANT
SPLINT PLASTER XTRA FAST SET 4 (CAST SUPPLIES) ×30
STOCKINETTE 4X48 STRL (DRAPES) ×3 IMPLANT
STRIP CLOSURE SKIN 1/2X4 (GAUZE/BANDAGES/DRESSINGS) IMPLANT
SUT ETHILON 4 0 PS 2 18 (SUTURE) ×3 IMPLANT
SUT ETHILON 5 0 PS 2 18 (SUTURE) IMPLANT
SUT PROLENE 3 0 PS 2 (SUTURE) IMPLANT
SUT VIC AB 4-0 P-3 18XBRD (SUTURE) IMPLANT
SUT VIC AB 4-0 P3 18 (SUTURE)
SUT VICRYL RAPIDE 4-0 (SUTURE) IMPLANT
SUT VICRYL RAPIDE 4/0 PS 2 (SUTURE) IMPLANT
SYR BULB 3OZ (MISCELLANEOUS) ×3 IMPLANT
SYRINGE 12CC LL (MISCELLANEOUS) ×3 IMPLANT
TOWEL OR 17X24 6PK STRL BLUE (TOWEL DISPOSABLE) ×3 IMPLANT
UNDERPAD 30X30 INCONTINENT (UNDERPADS AND DIAPERS) ×3 IMPLANT

## 2013-09-17 NOTE — Anesthesia Preprocedure Evaluation (Signed)
Anesthesia Evaluation  Patient identified by MRN, date of birth, ID band Patient awake    Reviewed: Allergy & Precautions, H&P , NPO status , Patient's Chart, lab work & pertinent test results, reviewed documented beta blocker date and time   Airway Mallampati: II TM Distance: >3 FB Neck ROM: full    Dental   Pulmonary sleep apnea and Continuous Positive Airway Pressure Ventilation ,  breath sounds clear to auscultation        Cardiovascular hypertension, On Medications and On Home Beta Blockers + Peripheral Vascular Disease Rhythm:regular     Neuro/Psych negative neurological ROS  negative psych ROS   GI/Hepatic Neg liver ROS, hiatal hernia, GERD-  Medicated and Controlled,  Endo/Other  negative endocrine ROSHypothyroidism Morbid obesity  Renal/GU Renal disease  negative genitourinary   Musculoskeletal   Abdominal   Peds  Hematology negative hematology ROS (+)   Anesthesia Other Findings See surgeon's H&P   Reproductive/Obstetrics negative OB ROS                           Anesthesia Physical Anesthesia Plan  ASA: III  Anesthesia Plan: General   Post-op Pain Management:    Induction: Intravenous  Airway Management Planned: LMA and Oral ETT  Additional Equipment:   Intra-op Plan:   Post-operative Plan: Extubation in OR  Informed Consent: I have reviewed the patients History and Physical, chart, labs and discussed the procedure including the risks, benefits and alternatives for the proposed anesthesia with the patient or authorized representative who has indicated his/her understanding and acceptance.   Dental Advisory Given  Plan Discussed with: CRNA and Surgeon  Anesthesia Plan Comments:         Anesthesia Quick Evaluation

## 2013-09-17 NOTE — Discharge Instructions (Signed)

## 2013-09-17 NOTE — Anesthesia Procedure Notes (Signed)
Procedure Name: LMA Insertion and Intubation Date/Time: 09/17/2013 1:25 PM Performed by: Lyndee Leo Pre-anesthesia Checklist: Patient identified, Emergency Drugs available, Suction available and Patient being monitored Patient Re-evaluated:Patient Re-evaluated prior to inductionOxygen Delivery Method: Circle system utilized Preoxygenation: Pre-oxygenation with 100% oxygen Intubation Type: IV induction Ventilation: Mask ventilation without difficulty LMA: LMA inserted LMA Size: 4.0 Laryngoscope Size: Miller and 2 Grade View: Grade IV Tube type: Oral Tube size: 7.0 mm Number of attempts: 2 Airway Equipment and Method: Video-laryngoscopy Placement Confirmation: ETT inserted through vocal cords under direct vision,  positive ETCO2 and breath sounds checked- equal and bilateral Secured at: 21 cm Dental Injury: Teeth and Oropharynx as per pre-operative assessment  Difficulty Due To: Difficulty was unanticipated, Difficult Airway- due to anterior larynx and Difficult Airway- due to reduced neck mobility Future Recommendations: Recommend- induction with short-acting agent, and alternative techniques readily available Comments: LMA placed, unable do seat well. Reposition s success. Convert to OET. Unable to visualize vocal cords with Sabra Heck 2. Glidescope x 1.

## 2013-09-17 NOTE — Op Note (Signed)
See note [id number]

## 2013-09-17 NOTE — H&P (Signed)
April Donaldson is an 80 y.o. female.   Chief Complaint: enlarging painful mass left hand HPI: as above with h/o enlarging recurrent mass volar aspect of left index   Past Medical History  Diagnosis Date  . Glaucoma   . Hypertension   . Cerebrovascular disease   . Venous insufficiency   . Hypercholesteremia   . Hypothyroid   . Obesity   . IBS (irritable bowel syndrome)   . Hx of colonic polyps   . DJD (degenerative joint disease)   . Chronic low back pain   . Gout   . Anxiety   . Cellulitis of leg 03/09/2013 hosp   . H/O hiatal hernia   . GERD (gastroesophageal reflux disease)     no meds  . OSA (obstructive sleep apnea)     NPSG 2009: AHI 20/h. CPAP - follows with [facility]  . Renal insufficiency     one kidney small and non funtioning  . Benign neoplasm of kidney, except pelvis     Past Surgical History  Procedure Laterality Date  . Hysterctomy and ooperectomy  1970's  . Thyroid lobectomy for a cyst  10/2000    Dr. Crossley  . Functional endoscopic sinus surgery  05/2003    Dr. Crossley  . Colonoscopy    . Tonsillectomy    . Appendectomy    . Dilation and curettage of uterus    . Mass excision  2003    lt    Family History  Problem Relation Age of Onset  . Heart disease Mother   . Asthma Brother   . Throat cancer Brother 65  . Asthma Sister   . Brain cancer Sister 65  . Breast cancer Sister 55   Social History:  reports that she has never smoked. She does not have any smokeless tobacco history on file. She reports that she does not drink alcohol or use illicit drugs.  Allergies:  Allergies  Allergen Reactions  . Amlodipine Besylate     Causes swelling  . Clonidine     Sweating, nausea, felt faint  . Diltiazem Hcl     meds did not work for her----causes swelling  . Doxycycline Hyclate     REACTION:  tetracycline  . Iodine     IVP contrast  . Klonopin [Clonazepam]     Vivid dreams  . Labetalol Hcl   . Olmesartan Medoxomil   . Penicillins     Pt not  allergic--just causes yeast infections    Medications Prior to Admission  Medication Sig Dispense Refill  . allopurinol (ZYLOPRIM) 100 MG tablet TAKE ONE TABLET DAILY  90 tablet  3  . amLODipine-benazepril (LOTREL) 5-10 MG per capsule Take 1 capsule by mouth daily.      . aspirin 81 MG tablet Take 81 mg by mouth daily.        . atenolol (TENORMIN) 100 MG tablet Take 1 tablet (100 mg total) by mouth daily.  90 tablet  3  . Calcium Carbonate-Vitamin D (CALTRATE 600+D) 600-400 MG-UNIT per tablet Take 1 tablet by mouth 2 (two) times daily.        . dorzolamide-timolol (COSOPT) 22.3-6.8 MG/ML ophthalmic solution Place 1 drop into both eyes 2 (two) times daily.      . latanoprost (XALATAN) 0.005 % ophthalmic solution Place 1 drop into the right eye at bedtime.        . levothyroxine (SYNTHROID, LEVOTHROID) 50 MCG tablet TAKE 1 TABLET BY MOUTH EVERY DAY  30 tablet    3  . Multiple Vitamins-Minerals (CENTRUM SILVER PO) Take 1 tablet by mouth daily.        . potassium chloride (MICRO-K) 10 MEQ CR capsule Take 1 capsule (10 mEq total) by mouth 2 (two) times daily.  180 capsule  3  . simvastatin (ZOCOR) 20 MG tablet Take 1 tablet (20 mg total) by mouth every evening.  90 tablet  3  . triamterene-hydrochlorothiazide (DYAZIDE) 37.5-25 MG per capsule TAKE ONE CAPSULE BY MOUTH EVERY DAY  90 capsule  3  . Guaifenesin (MUCINEX MAXIMUM STRENGTH) 1200 MG TB12 Take 1 tablet by mouth as needed.         Results for orders placed during the hospital encounter of 09/17/13 (from the past 48 hour(s))  BASIC METABOLIC PANEL     Status: Abnormal   Collection Time    09/15/13  3:30 PM      Result Value Ref Range   Sodium 141  137 - 147 mEq/L   Potassium 3.9  3.7 - 5.3 mEq/L   Chloride 102  96 - 112 mEq/L   CO2 25  19 - 32 mEq/L   Glucose, Bld 97  70 - 99 mg/dL   BUN 18  6 - 23 mg/dL   Creatinine, Ser 1.03  0.50 - 1.10 mg/dL   Calcium 10.3  8.4 - 10.5 mg/dL   GFR calc non Af Amer 50 (*) >90 mL/min   GFR calc Af  Amer 58 (*) >90 mL/min   Comment: (NOTE)     The eGFR has been calculated using the CKD EPI equation.     This calculation has not been validated in all clinical situations.     eGFR's persistently <90 mL/min signify possible Chronic Kidney     Disease.   Anion gap 14  5 - 15  POCT HEMOGLOBIN-HEMACUE     Status: Abnormal   Collection Time    09/17/13 12:05 PM      Result Value Ref Range   Hemoglobin 11.9 (*) 12.0 - 15.0 g/dL   No results found.  Review of Systems  All other systems reviewed and are negative.   Blood pressure 175/90, pulse 56, temperature 98.2 F (36.8 C), temperature source Oral, resp. rate 16, height 4' 11" (1.499 m), weight 83.178 kg (183 lb 6 oz), SpO2 96.00%. Physical Exam  Constitutional: She is oriented to person, place, and time. She appears well-developed and well-nourished.  HENT:  Head: Normocephalic and atraumatic.  Cardiovascular: Normal rate.   Respiratory: Effort normal.  Musculoskeletal:       Left hand: She exhibits tenderness and deformity.  Left hand volar mass at index A-1 pulley area 2x3 cm  Neurological: She is alert and oriented to person, place, and time.  Skin: Skin is warm.  Psychiatric: She has a normal mood and affect. Her behavior is normal. Judgment and thought content normal.     Assessment/Plan As above   Plan excisional biopsy  Cortana Vanderford A 09/17/2013, 12:59 PM

## 2013-09-17 NOTE — Transfer of Care (Signed)
Immediate Anesthesia Transfer of Care Note  Patient: April Donaldson  Procedure(s) Performed: Procedure(s): EXCISION MASS LEFT HAND (Left)  Patient Location: PACU  Anesthesia Type:General  Level of Consciousness: awake, sedated and patient cooperative  Airway & Oxygen Therapy: Patient Spontanous Breathing and Patient connected to face mask oxygen  Post-op Assessment: Report given to PACU RN and Post -op Vital signs reviewed and stable  Post vital signs: Reviewed and stable  Complications: No apparent anesthesia complications

## 2013-09-17 NOTE — Anesthesia Postprocedure Evaluation (Signed)
Anesthesia Post Note  Patient: April Donaldson  Procedure(s) Performed: Procedure(s) (LRB): EXCISION MASS LEFT HAND (Left)  Anesthesia type: General  Patient location: PACU  Post pain: Pain level controlled  Post assessment: Patient's Cardiovascular Status Stable  Last Vitals:  Filed Vitals:   09/17/13 1430  BP: 113/62  Pulse: 54  Temp:   Resp: 16    Post vital signs: Reviewed and stable  Level of consciousness: alert  Complications: No apparent anesthesia complications

## 2013-09-18 ENCOUNTER — Encounter (HOSPITAL_BASED_OUTPATIENT_CLINIC_OR_DEPARTMENT_OTHER): Payer: Self-pay | Admitting: Orthopedic Surgery

## 2013-09-22 NOTE — Op Note (Signed)
NAME:  April Donaldson, April Donaldson NO.:  MEDICAL RECORD NO.:  [id number]  LOCATION:                                 FACILITY:  PHYSICIAN:  Sheral Apley. Burney Gauze, M.D.   DATE OF BIRTH:  DATE OF PROCEDURE:  09/17/2013 DATE OF DISCHARGE:                              OPERATIVE REPORT   PREOPERATIVE DIAGNOSIS:  Painful enlarging mass, palmar aspect, left hand, over index, A1 pulley area.  POSTOPERATIVE DIAGNOSIS:  Painful enlarging mass, palmar aspect, left hand, over index, A1 pulley area.  PROCEDURE:  Excisional biopsy, deep mass, left hand.  SURGEON:  Sheral Apley. Burney Gauze, M.D.  ASSISTANT:  None.  ANESTHESIA:  General.  COMPLICATION:  None.  DRAINS:  None.  SPECIMENS:  Bone specimen sent.  DESCRIPTION OF PROCEDURE:  The patient was taken to the operating suite. After induction of adequate general anesthesia, left upper extremity was prepped and draped in sterile fashion.  An Esmarch was used to exsanguinate the limb.  Tourniquet inflated to 275 mmHg.  At this point in time, an oblique incision was made from the ulnar side of the index finger, MP flexion crease, going through the mid palmar area, 3-4 cm in length.  Skin was incised.  Dissection was carried down to skin and subcutaneous tissues.  A large multilobulated lesion was seen coming off the flexor sheath of the index finger involving the radial neurovascular bundle to the radial side of the long finger and the ulnar side of the index finger.  Carefully identified both of these neurovascular structures and retract them, carefully dissected this mass off of the underlying neurovascular bundles and flexor sheath.  It was sent for pathologic confirmation.  Wound was then thoroughly irrigated. Hemostasis achieved with bipolar cautery, was loosely closed with 4-0 nylon x5 horizontal mattress sutures.  Xeroform, 4x4s, and compressive dressing was applied.  The patient tolerated the procedure well and went to  the recovery room in stable fashion.    Sheral Apley Burney Gauze, M.D.    MAW/MEDQ  D:  09/17/2013  T:  09/17/2013  Job:  [id number]

## 2013-10-02 ENCOUNTER — Encounter: Payer: Self-pay | Admitting: Internal Medicine

## 2013-10-02 ENCOUNTER — Ambulatory Visit (INDEPENDENT_AMBULATORY_CARE_PROVIDER_SITE_OTHER): Payer: Medicare Other | Admitting: Internal Medicine

## 2013-10-02 VITALS — BP 130/78 | HR 65 | Temp 98.0°F | Ht 59.0 in | Wt 182.5 lb

## 2013-10-02 DIAGNOSIS — E039 Hypothyroidism, unspecified: Secondary | ICD-10-CM

## 2013-10-02 DIAGNOSIS — I10 Essential (primary) hypertension: Secondary | ICD-10-CM

## 2013-10-02 DIAGNOSIS — E7800 Pure hypercholesterolemia, unspecified: Secondary | ICD-10-CM

## 2013-10-02 DIAGNOSIS — H409 Unspecified glaucoma: Secondary | ICD-10-CM

## 2013-10-02 DIAGNOSIS — B372 Candidiasis of skin and nail: Secondary | ICD-10-CM

## 2013-10-02 MED ORDER — NYSTATIN 100000 UNIT/GM EX POWD: 1.0000 g | Freq: Two times a day (BID) | CUTANEOUS | Status: DC

## 2013-10-02 MED ORDER — LEVOTHYROXINE SODIUM 50 MCG PO TABS: 50.0000 ug | ORAL_TABLET | Freq: Every day | ORAL | Status: DC

## 2013-10-02 NOTE — Assessment & Plan Note (Signed)
History of partial thyroidectomy 2002 for cysts reviewed On Synthroid replacement for same Last labs reviewed, check q. 6-12 months, titrate as needed The current medical regimen is effective;  continue present plan and medications.

## 2013-10-02 NOTE — Assessment & Plan Note (Signed)
Follows with optho ([facility]) q 3-67mo Reviewed drops - denies vision changes The current medical regimen is effective;  continue present plan and medications.

## 2013-10-02 NOTE — Patient Instructions (Signed)
It was good to see you today.  We have reviewed your prior records including labs and tests today  Medications reviewed and updated, no changes recommended at this time. Use yeast powder for your rash and call if other problems Refill on medication(s) as discussed today.  Continue working with your other specialists as reviewed  Please schedule followup in 4-6 months for annual wellness and labs (come in fasting), call sooner if problems.

## 2013-10-02 NOTE — Assessment & Plan Note (Signed)
Simvastatin dose reduced 02/2013 from 40 mg daily to 20 mg daily because of co administration with amlodipine Last lipids at goal, follow semi-annually and titrate or change statin as needed The current medical regimen is effective;  continue present plan and medications.

## 2013-10-02 NOTE — Assessment & Plan Note (Signed)
BP Readings from Last 3 Encounters:  10/02/13 130/78  09/17/13 122/63  09/17/13 122/63   The current medical regimen is effective;  continue present plan and medications.

## 2013-10-02 NOTE — Progress Notes (Signed)
Pre visit review using our clinic review tool, if applicable. No additional management support is needed unless otherwise documented below in the visit note. 

## 2013-10-02 NOTE — Progress Notes (Signed)
Subjective:    Patient ID: April Donaldson, female    DOB: 1931-12-26, 80 y.o.   MRN: [id number]  HPI  Patient is here for follow up  Reviewed chronic medical issues and interval medical events  Past Medical History  Diagnosis Date  . Glaucoma   . Hypertension   . Cerebrovascular disease   . Venous insufficiency   . Hypercholesteremia   . Hypothyroid   . Obesity   . IBS (irritable bowel syndrome)   . Hx of colonic polyps   . DJD (degenerative joint disease)   . Chronic low back pain   . Gout   . Anxiety   . Cellulitis of leg 03/09/2013 hosp   . H/O hiatal hernia   . GERD (gastroesophageal reflux disease)     no meds  . OSA (obstructive sleep apnea)     NPSG 2009: AHI 20/h. CPAP - follows with [facility]  . Renal insufficiency     one kidney small and non funtioning  . Benign neoplasm of kidney, except pelvis     Review of Systems  Respiratory: Negative for cough and shortness of breath.   Cardiovascular: Negative for chest pain and leg swelling.  Skin: Positive for rash (under pannus since and surgery 2 weeks ago). Negative for wound.       Objective:   Physical Exam  BP 130/78  Pulse 65  Temp(Src) 98 F (36.7 C) (Oral)  Ht 4\' 11"  (1.499 m)  Wt 182 lb 8 oz (82.781 kg)  BMI 36.84 kg/m2  SpO2 93% Wt Readings from Last 3 Encounters:  10/02/13 182 lb 8 oz (82.781 kg)  09/17/13 183 lb 6 oz (83.178 kg)  09/17/13 183 lb 6 oz (83.178 kg)   Constitutional: She is obese, and appears well-developed and well-nourished. No distress.  Neck: Normal range of motion. Neck supple. No JVD present. No thyromegaly present.  Cardiovascular: Normal rate, regular rhythm and normal heart sounds.  No murmur heard. No BLE edema. Pulmonary/Chest: Effort normal and breath sounds normal. No respiratory distress. She has no wheezes.  Skin - candidiasis changes under pannus fold of abdomen Psychiatric: She has a normal mood and affect. Her behavior is normal. Judgment and thought content  normal.   Lab Results  Component Value Date   WBC 11.0* 03/24/2013   HGB 11.9* 09/17/2013   HCT 37.1 03/24/2013   PLT 387.0 03/24/2013   GLUCOSE 97 09/15/2013   CHOL 139 03/24/2013   TRIG 96.0 03/24/2013   HDL 49.60 03/24/2013   LDLCALC 70 03/24/2013   ALT 19 03/24/2013   AST 19 03/24/2013   NA 141 09/15/2013   K 3.9 09/15/2013   CL 102 09/15/2013   CREATININE 1.03 09/15/2013   BUN 18 09/15/2013   CO2 25 09/15/2013   TSH 2.82 03/24/2013    No results found.     Assessment & Plan:   Candidiasis - yeast powder - erx done - eduction provided  Problem List Items Addressed This Visit   GLAUCOMA     Follows with optho (shapiro/[facility]) q 3-61mo Reviewed drops - denies vision changes The current medical regimen is effective;  continue present plan and medications.     HYPERCHOLESTEROLEMIA     Simvastatin dose reduced 02/2013 from 40 mg daily to 20 mg daily because of co administration with amlodipine Last lipids at goal, follow semi-annually and titrate or change statin as needed The current medical regimen is effective;  continue present plan and medications.  HYPERTENSION      BP Readings from Last 3 Encounters:  10/02/13 130/78  09/17/13 122/63  09/17/13 122/63   The current medical regimen is effective;  continue present plan and medications.    HYPOTHYROIDISM     History of partial thyroidectomy 2002 for cysts reviewed On Synthroid replacement for same Last labs reviewed, check q. 6-12 months, titrate as needed The current medical regimen is effective;  continue present plan and medications.    Relevant Medications      levothyroxine (SYNTHROID, LEVOTHROID) tablet    Other Visit Diagnoses   Candidal skin infection    -  Primary    Relevant Medications       Nystatin (MYCOSTATIN) 100,000 units/Gm top powder

## 2013-10-03 ENCOUNTER — Telehealth: Payer: Self-pay | Admitting: Internal Medicine

## 2013-10-03 NOTE — Telephone Encounter (Signed)
Relevant patient education mailed to patient.  

## 2013-10-14 ENCOUNTER — Encounter: Payer: Self-pay | Admitting: Internal Medicine

## 2013-10-14 ENCOUNTER — Ambulatory Visit (INDEPENDENT_AMBULATORY_CARE_PROVIDER_SITE_OTHER): Payer: Medicare Other | Admitting: Internal Medicine

## 2013-10-14 VITALS — BP 128/70 | HR 61 | Temp 98.1°F | Ht 59.0 in | Wt 183.0 lb

## 2013-10-14 DIAGNOSIS — B372 Candidiasis of skin and nail: Secondary | ICD-10-CM

## 2013-10-14 NOTE — Patient Instructions (Addendum)
It was good to see you today.  We have reviewed your prior records including labs and tests today  Medications reviewed and updated, no changes recommended at this time. Continue same yeast powder for your rash and call if other problems  Continue working with your other specialists as reviewed  Candida Infection A Candida infection (also called yeast, fungus, and Monilia infection) is an overgrowth of yeast that can occur anywhere on the body. A yeast infection commonly occurs in warm, moist body areas. Usually, the infection remains localized but can spread to become a systemic infection. A yeast infection may be a sign of a more severe disease such as diabetes, leukemia, or AIDS. A yeast infection can occur in both men and women. In women, Candida vaginitis is a vaginal infection. It is one of the most common causes of vaginitis. Men usually do not have symptoms or know they have an infection until other problems develop. Men may find out they have a yeast infection because their sex partner has a yeast infection. Uncircumcised men are more likely to get a yeast infection than circumcised men. This is because the uncircumcised glans is not exposed to air and does not remain as dry as that of a circumcised glans. Older adults may develop yeast infections around dentures. CAUSES  Women  Antibiotics.  Steroid medication taken for a long time.  Being overweight (obese).  Diabetes.  Poor immune condition.  Certain serious medical conditions.  Immune suppressive medications for organ transplant patients.  Chemotherapy.  Pregnancy.  Menstruation.  Stress and fatigue.  Intravenous drug use.  Oral contraceptives.  Wearing tight-fitting clothes in the crotch area.  Catching it from a sex partner who has a yeast infection.  Spermicide.  Intravenous, urinary, or other catheters. Men  Catching it from a sex partner who has a yeast infection.  Having oral or anal sex with a  person who has the infection.  Spermicide.  Diabetes.  Antibiotics.  Poor immune system.  Medications that suppress the immune system.  Intravenous drug use.  Intravenous, urinary, or other catheters. SYMPTOMS  Women  Thick, white vaginal discharge.  Vaginal itching.  Redness and swelling in and around the vagina.  Irritation of the lips of the vagina and perineum.  Blisters on the vaginal lips and perineum.  Painful sexual intercourse.  Low blood sugar (hypoglycemia).  Painful urination.  Bladder infections.  Intestinal problems such as constipation, indigestion, bad breath, bloating, increase in gas, diarrhea, or loose stools. Men  Men may develop intestinal problems such as constipation, indigestion, bad breath, bloating, increase in gas, diarrhea, or loose stools.  Dry, cracked skin on the penis with itching or discomfort.  Jock itch.  Dry, flaky skin.  Athlete's foot.  Hypoglycemia. DIAGNOSIS  Women  A history and an exam are performed.  The discharge may be examined under a microscope.  A culture may be taken of the discharge. Men  A history and an exam are performed.  Any discharge from the penis or areas of cracked skin will be looked at under the microscope and cultured.  Stool samples may be cultured. TREATMENT  Women  Vaginal antifungal suppositories and creams.  Medicated creams to decrease irritation and itching on the outside of the vagina.  Warm compresses to the perineal area to decrease swelling and discomfort.  Oral antifungal medications.  Medicated vaginal suppositories or cream for repeated or recurrent infections.  Wash and dry the irritation areas before applying the cream.  Eating yogurt with  Lactobacillus may help with prevention and treatment.  Sometimes painting the vagina with gentian violet solution may help if creams and suppositories do not work. Men  Antifungal creams and oral antifungal  medications.  Sometimes treatment must continue for 30 days after the symptoms go away to prevent recurrence. HOME CARE INSTRUCTIONS  Women  Use cotton underwear and avoid tight-fitting clothing.  Avoid colored, scented toilet paper and deodorant tampons or pads.  Do not douche.  Keep your diabetes under control.  Finish all the prescribed medications.  Keep your skin clean and dry.  Consume milk or yogurt with Lactobacillus-active culture regularly. If you get frequent yeast infections and think that is what the infection is, there are over-the-counter medications that you can get. If the infection does not show healing in 3 days, talk to your caregiver.  Tell your sex partner you have a yeast infection. Your partner may need treatment also, especially if your infection does not clear up or recurs. Men  Keep your skin clean and dry.  Keep your diabetes under control.  Finish all prescribed medications.  Tell your sex partner that you have a yeast infection so he or she can be treated if necessary. SEEK MEDICAL CARE IF:   Your symptoms do not clear up or worsen in one week after treatment.  You have an oral temperature above 102 F (38.9 C).  You have trouble swallowing or eating for a prolonged time.  You develop blisters on and around your vagina.  You develop vaginal bleeding and it is not your menstrual period.  You develop abdominal pain.  You develop intestinal problems as mentioned above.  You get weak or light-headed.  You have painful or increased urination.  You have pain during sexual intercourse. MAKE SURE YOU:   Understand these instructions.  Will watch your condition.  Will get help right away if you are not doing well or get worse. Document Released: 03/23/2004 Document Revised: 06/30/2013 Document Reviewed: 07/05/2009 [facility] Patient Information 2015 [place], [place]. This information is not intended to replace advice given to you by your  health care provider. Make sure you discuss any questions you have with your health care provider.

## 2013-10-14 NOTE — Progress Notes (Signed)
   Subjective:    Patient ID: April Donaldson, female    DOB: September 23, 1931, 80 y.o.   MRN: [id number]  Rash Pertinent negatives include no cough, fatigue, fever or shortness of breath.    Patient is here for follow up  Reviewed chronic medical issues and interval medical events  Past Medical History  Diagnosis Date  . Glaucoma   . Hypertension   . Cerebrovascular disease   . Venous insufficiency   . Hypercholesteremia   . Hypothyroid   . Obesity   . IBS (irritable bowel syndrome)   . Hx of colonic polyps   . DJD (degenerative joint disease)   . Chronic low back pain   . Gout   . Anxiety   . Cellulitis of leg 03/09/2013 hosp   . H/O hiatal hernia   . GERD (gastroesophageal reflux disease)     no meds  . OSA (obstructive sleep apnea)     NPSG 2009: AHI 20/h. CPAP - follows with [facility]  . Renal insufficiency     one kidney small and non funtioning  . Benign neoplasm of kidney, except pelvis     Review of Systems  Constitutional: Negative for fever and fatigue.  Respiratory: Negative for cough and shortness of breath.   Cardiovascular: Negative for chest pain and leg swelling.  Skin: Positive for rash.       Objective:   Physical Exam  BP 128/70  Pulse 61  Temp(Src) 98.1 F (36.7 C) (Oral)  Ht 4\' 11"  (1.499 m)  Wt 183 lb (83.008 kg)  BMI 36.94 kg/m2  SpO2 98% Wt Readings from Last 3 Encounters:  10/14/13 183 lb (83.008 kg)  10/02/13 182 lb 8 oz (82.781 kg)  09/17/13 183 lb 6 oz (83.178 kg)   Constitutional: She is obese, and appears well-developed and well-nourished. No distress.  Neck: Normal range of motion. Neck supple. No JVD present. No thyromegaly present.  Cardiovascular: Normal rate, regular rhythm and normal heart sounds.  No murmur heard. No BLE edema. Pulmonary/Chest: Effort normal and breath sounds normal. No respiratory distress. She has no wheezes.  Skin: improving candida rash B breasts, B groin and under pannus - no erythema, peeling  skin Psychiatric: She has a normal mood and affect. Her behavior is normal. Judgment and thought content normal.   Lab Results  Component Value Date   WBC 11.0* 03/24/2013   HGB 11.9* 09/17/2013   HCT 37.1 03/24/2013   PLT 387.0 03/24/2013   GLUCOSE 97 09/15/2013   CHOL 139 03/24/2013   TRIG 96.0 03/24/2013   HDL 49.60 03/24/2013   LDLCALC 70 03/24/2013   ALT 19 03/24/2013   AST 19 03/24/2013   NA 141 09/15/2013   K 3.9 09/15/2013   CL 102 09/15/2013   CREATININE 1.03 09/15/2013   BUN 18 09/15/2013   CO2 25 09/15/2013   TSH 2.82 03/24/2013    No results found.     Assessment & Plan:   Candidiasis, improving.  Additional education provided. Reassurance. Continue nystatin Allergy list updated at patient request to reflect hypersensitivity to lidocaine

## 2013-10-14 NOTE — Progress Notes (Signed)
Pre visit review using our clinic review tool, if applicable. No additional management support is needed unless otherwise documented below in the visit note. 

## 2013-10-24 ENCOUNTER — Encounter: Payer: Self-pay | Admitting: Internal Medicine

## 2013-11-06 ENCOUNTER — Ambulatory Visit (INDEPENDENT_AMBULATORY_CARE_PROVIDER_SITE_OTHER): Payer: Medicare Other

## 2013-11-06 ENCOUNTER — Ambulatory Visit: Payer: Medicare Other

## 2013-11-06 DIAGNOSIS — Z23 Encounter for immunization: Secondary | ICD-10-CM

## 2013-11-26 ENCOUNTER — Encounter: Payer: Self-pay | Admitting: Internal Medicine

## 2013-12-30 ENCOUNTER — Ambulatory Visit (INDEPENDENT_AMBULATORY_CARE_PROVIDER_SITE_OTHER): Payer: Medicare Other | Admitting: Internal Medicine

## 2013-12-30 ENCOUNTER — Encounter: Payer: Self-pay | Admitting: Internal Medicine

## 2013-12-30 VITALS — BP 130/70 | HR 60 | Ht 59.0 in | Wt 182.6 lb

## 2013-12-30 DIAGNOSIS — R1314 Dysphagia, pharyngoesophageal phase: Secondary | ICD-10-CM

## 2013-12-30 DIAGNOSIS — K219 Gastro-esophageal reflux disease without esophagitis: Secondary | ICD-10-CM

## 2013-12-30 MED ORDER — RANITIDINE HCL 150 MG PO CAPS: 150.0000 mg | ORAL_CAPSULE | Freq: Every day | ORAL | Status: DC

## 2013-12-30 NOTE — Progress Notes (Signed)
   Subjective:    Patient ID: April Donaldson, female    DOB: November 18, 1931, 80 y.o.   MRN: [id number]  HPI   April Donaldson is a nice lady known to me from prior colonoscopy. She used to see Dr. Velora Heckler. Had dysphagia problems earlier this year and was treated with antibiotics by Dr. Ernesto Rutherford . Was referred here at that time but after the antibiotics the dysphagia resolved. No unintentional weight loss. Does have a sour taste in her mouth in the AM. Seems to be worsening. No heartburn noted. Eats at 6 PM and goes to bed > 3 hrs after. Rare tea, only caffeine and no smoking. She raises her head in the bed some.  Medications, allergies, past medical history, past surgical history, family history and social history are reviewed and updated in the EMR.  Review of Systems As above    Objective:   Physical Exam General:  Obese well-developed, well-nourished and in no acute distress Eyes:  anicteric. ENT:   Mouth and posterior pharynx free of lesions.  Neck:   supple w/o thyromegaly or mass.  Lungs: Clear to auscultation bilaterally. Heart:  S1S2, no rubs, murmurs, gallops. Abdomen:  obese, soft, non-tender, and BS+.  Neuro:  A&O x 3.  Psych:  appropriate mood and  Affect.   Data Reviewed:  I requested Dr. Ernesto Rutherford  ENT Notes      Assessment & Plan:  Dysphagia, pharyngoesophageal phase  Gastroesophageal reflux disease, esophagitis presence not specified - Plan: ranitidine (ZANTAC) 150 MG capsule  1) Dysphagia resolved with Abx - will review records but no further evaluation given absence of sxs now 2) The AM sour taste in mouth may be related to reflux so will try ranitidine 150 mg qhs and elevate HOB 3) F/U prn return of dysphagia and if ranitidine does not help - could consider Zegerid at bedtime vs 300 mg ranitidine   I appreciate the opportunity to care for this patient. I will copy Dr. Minna Merritts

## 2013-12-30 NOTE — Patient Instructions (Signed)
We have sent the following medications to your pharmacy for you to pick up at your convenience: Generic Zantac  Elevate the head of your bed.  Call us back if the problems return.  We will obtain your Dr. Ernesto Rutherford records to review.   I appreciate the opportunity to care for you.

## 2014-02-03 ENCOUNTER — Other Ambulatory Visit: Payer: Self-pay | Admitting: Pulmonary Disease

## 2014-02-06 ENCOUNTER — Other Ambulatory Visit: Payer: Self-pay | Admitting: Pulmonary Disease

## 2014-02-09 ENCOUNTER — Telehealth: Payer: Self-pay | Admitting: Internal Medicine

## 2014-02-09 MED ORDER — TRIAMTERENE-HCTZ 37.5-25 MG PO CAPS: 1.0000 | ORAL_CAPSULE | Freq: Every day | ORAL | Status: DC

## 2014-02-09 NOTE — Telephone Encounter (Signed)
Yes - on med list - ok to refill - erx done

## 2014-02-09 NOTE — Telephone Encounter (Signed)
Pls advise:  Should pt be taking triamterene?

## 2014-02-09 NOTE — Telephone Encounter (Signed)
Pt called request refill for triamterene to be send to [facility]. Pt stated Dr. Lenna Gilford was taking of this med but now she transfer to [facility]. Please advise.

## 2014-04-07 ENCOUNTER — Encounter: Payer: Medicare Other | Admitting: Internal Medicine

## 2014-04-30 ENCOUNTER — Other Ambulatory Visit (INDEPENDENT_AMBULATORY_CARE_PROVIDER_SITE_OTHER): Payer: Medicare Other

## 2014-04-30 ENCOUNTER — Encounter: Payer: Self-pay | Admitting: Internal Medicine

## 2014-04-30 ENCOUNTER — Ambulatory Visit (INDEPENDENT_AMBULATORY_CARE_PROVIDER_SITE_OTHER): Payer: Medicare Other | Admitting: Internal Medicine

## 2014-04-30 VITALS — BP 128/76 | HR 58 | Temp 97.8°F | Ht 59.0 in | Wt 183.0 lb

## 2014-04-30 DIAGNOSIS — K121 Other forms of stomatitis: Secondary | ICD-10-CM

## 2014-04-30 DIAGNOSIS — Z79899 Other long term (current) drug therapy: Secondary | ICD-10-CM | POA: Diagnosis not present

## 2014-04-30 LAB — CBC WITH DIFFERENTIAL/PLATELET
BASOS PCT: 1 % (ref 0.0–3.0)
Basophils Absolute: 0.1 10*3/uL (ref 0.0–0.1)
Eosinophils Absolute: 0.3 10*3/uL (ref 0.0–0.7)
Eosinophils Relative: 3 % (ref 0.0–5.0)
HEMATOCRIT: 41.9 % (ref 36.0–46.0)
HEMOGLOBIN: 14.3 g/dL (ref 12.0–15.0)
Lymphocytes Relative: 46.9 % — ABNORMAL HIGH (ref 12.0–46.0)
Lymphs Abs: 4.9 10*3/uL — ABNORMAL HIGH (ref 0.7–4.0)
MCHC: 34 g/dL (ref 30.0–36.0)
MCV: 87 fl (ref 78.0–100.0)
Monocytes Absolute: 1.1 10*3/uL — ABNORMAL HIGH (ref 0.1–1.0)
Monocytes Relative: 10.1 % (ref 3.0–12.0)
NEUTROS PCT: 39 % — AB (ref 43.0–77.0)
Neutro Abs: 4.1 10*3/uL (ref 1.4–7.7)
Platelets: 275 10*3/uL (ref 150.0–400.0)
RBC: 4.82 Mil/uL (ref 3.87–5.11)
RDW: 14 % (ref 11.5–15.5)
WBC: 10.5 10*3/uL (ref 4.0–10.5)

## 2014-04-30 LAB — HEMOGLOBIN A1C: Hgb A1c MFr Bld: 5.5 % (ref 4.6–6.5)

## 2014-04-30 MED ORDER — CLOTRIMAZOLE 10 MG MT TROC: 10.0000 mg | Freq: Every day | OROMUCOSAL | Status: DC

## 2014-04-30 NOTE — Patient Instructions (Signed)
Dissolve the lozenges in the back of your mouth 5 times a day. Brush tongue with a toothbrush daily to remove the coating.

## 2014-04-30 NOTE — Progress Notes (Signed)
Pre visit review using our clinic review tool, if applicable. No additional management support is needed unless otherwise documented below in the visit note. 

## 2014-04-30 NOTE — Progress Notes (Signed)
   Subjective:    Patient ID: April Donaldson, female    DOB: 11/20/1931, 81 y.o.   MRN: [id number]  HPI Her symptoms began as coating of the tongue 1 week ago. She thought it might be related to the insurance company changing her Lotrel from the brand to the generic combination. Her blood pressure did go up on the generic formulation and the brand was restarted by her Nephrologist.  She is nondiabetic. The last glucose on record was less than 100. She's had no antibiotics in the last 30 days.  Review of Systems She denies fever, chills, sweats. She has no itching or swelling of the lips or tongue.  She has no shortness of breath or wheezing.     Objective:   Physical Exam Pertinent or positive findings include: Arcus senilis is present. There is slight coating over the dorsum of the tongue without frank candidiasis.  There is irregular faint soft palate erythema.  Grade 1 systolic murmurs present. The second heart sound is increased.  The pedal pulses are slightly decreased.  She is wearing support hose.  General appearance :adequately nourished; in no distress. Eyes: No conjunctival inflammation or scleral icterus is present. Oral exam:  Lips and gums are healthy appearing.There is no oropharyngeal erythema or exudate noted. Dental hygiene is good. Heart:  Normal rate and regular rhythm. S1  normal without gallop,  click, rub or other extra sounds   Lungs:Chest clear to auscultation; no wheezes, rhonchi,rales ,or rubs present.No increased work of breathing.  Abdomen: bowel sounds normal, soft and non-tender without masses, organomegaly or hernias noted.  No guarding or rebound.  Vascular : all pulses equal ; no bruits present. Skin:Warm & dry.  Intact without suspicious lesions or rashes ; no tenting Lymphatic: No lymphadenopathy is noted about the head, neck, axilla Neuro: Strength, tone  normal.      Assessment & Plan:  #1 erythema of the palate & slight tongue  coating without frank oral candidiasis Plan: See orders and after visit summary.

## 2014-05-21 ENCOUNTER — Other Ambulatory Visit: Payer: Self-pay | Admitting: Internal Medicine

## 2014-05-31 ENCOUNTER — Other Ambulatory Visit: Payer: Self-pay | Admitting: Internal Medicine

## 2014-06-03 ENCOUNTER — Ambulatory Visit (INDEPENDENT_AMBULATORY_CARE_PROVIDER_SITE_OTHER): Payer: Medicare Other | Admitting: Pulmonary Disease

## 2014-06-03 ENCOUNTER — Encounter: Payer: Self-pay | Admitting: Pulmonary Disease

## 2014-06-03 DIAGNOSIS — G4733 Obstructive sleep apnea (adult) (pediatric): Secondary | ICD-10-CM

## 2014-06-03 NOTE — Progress Notes (Signed)
   Subjective:    Patient ID: April Donaldson, female    DOB: 1931-06-28, 81 y.o.   MRN: [id number]  HPI The patient comes in today for follow-up of her obstructive sleep apnea. Unfortunately, she has been noncompliant with her device because of ongoing dental and other medical issues. She is also had significant mask fitting issues, and never found one that fits her properly. She is also concerned that her pressure may not be adequate at this time.   Review of Systems  Constitutional: Negative for fever and unexpected weight change.  HENT: Negative for congestion, dental problem, ear pain, nosebleeds, postnasal drip, rhinorrhea, sinus pressure, sneezing, sore throat and trouble swallowing.   Eyes: Negative for redness and itching.  Respiratory: Negative for cough, chest tightness, shortness of breath and wheezing.   Cardiovascular: Negative for palpitations and leg swelling.  Gastrointestinal: Negative for nausea and vomiting.  Genitourinary: Negative for dysuria.  Musculoskeletal: Negative for joint swelling.  Skin: Negative for rash.  Neurological: Negative for headaches.  Hematological: Does not bruise/bleed easily.  Psychiatric/Behavioral: Negative for dysphoric mood. The patient is not nervous/anxious.        Objective:   Physical Exam Overweight female in no acute distress Nose without purulence or discharge noted Neck without lymphadenopathy or thyromegaly No skin breakdown or pressure necrosis from the C Pap mask Lower extremities with edema noted, no cyanosis Alert and oriented, moves all 4 extremities.       Assessment & Plan:

## 2014-06-03 NOTE — Assessment & Plan Note (Signed)
The patient has not been wearing CPAP recently because of ongoing dental and other health issues.  She has also been having issues with her mask fit, and has not been able to make a chin strap work for her. She is also concerned that her pressure is not set adequately. At this point, we'll send her to the sleep Center for a formal fitting, and will also have her machine put on auto. I have asked her to call us if she continues to have issues with tolerance. Finally, I have encouraged her to work aggressively on weight reduction.

## 2014-06-03 NOTE — Patient Instructions (Signed)
Will arrange for mask fitting at the sleep center. Will have some adjustments made to the pressure Work on weight reduction followup with me again in one year if you are doing well, but call if you are still struggling with your cpap.

## 2014-06-04 ENCOUNTER — Telehealth: Payer: Self-pay | Admitting: Pulmonary Disease

## 2014-06-04 DIAGNOSIS — G4733 Obstructive sleep apnea (adult) (pediatric): Secondary | ICD-10-CM

## 2014-06-04 NOTE — Telephone Encounter (Signed)
Ok to send this.  Have them send the download to [place].

## 2014-06-04 NOTE — Telephone Encounter (Signed)
Order was placed for auto setting on pt's CPAP machine. Her machine is not capable of this. [facility] is requesting an order for 2 week auto titration with pressure setting.  [place] - please advise. Thanks.

## 2014-06-04 NOTE — Telephone Encounter (Signed)
Order placed.  Spoke with Melissa with [facility] and let her know.

## 2014-06-08 ENCOUNTER — Ambulatory Visit (HOSPITAL_BASED_OUTPATIENT_CLINIC_OR_DEPARTMENT_OTHER): Payer: Medicare Other | Attending: Pulmonary Disease | Admitting: Radiology

## 2014-06-08 DIAGNOSIS — Z9989 Dependence on other enabling machines and devices: Principal | ICD-10-CM

## 2014-06-08 DIAGNOSIS — G4733 Obstructive sleep apnea (adult) (pediatric): Secondary | ICD-10-CM

## 2014-06-15 ENCOUNTER — Ambulatory Visit (INDEPENDENT_AMBULATORY_CARE_PROVIDER_SITE_OTHER): Payer: Medicare Other | Admitting: Internal Medicine

## 2014-06-15 ENCOUNTER — Encounter: Payer: Self-pay | Admitting: Internal Medicine

## 2014-06-15 ENCOUNTER — Other Ambulatory Visit (INDEPENDENT_AMBULATORY_CARE_PROVIDER_SITE_OTHER): Payer: Medicare Other

## 2014-06-15 VITALS — BP 112/68 | HR 61 | Temp 98.1°F | Resp 18 | Ht 59.0 in | Wt 181.0 lb

## 2014-06-15 DIAGNOSIS — E7800 Pure hypercholesterolemia, unspecified: Secondary | ICD-10-CM

## 2014-06-15 DIAGNOSIS — I10 Essential (primary) hypertension: Secondary | ICD-10-CM

## 2014-06-15 DIAGNOSIS — Z0000 Encounter for general adult medical examination without abnormal findings: Secondary | ICD-10-CM | POA: Diagnosis not present

## 2014-06-15 DIAGNOSIS — N183 Chronic kidney disease, stage 3 unspecified: Secondary | ICD-10-CM

## 2014-06-15 DIAGNOSIS — E039 Hypothyroidism, unspecified: Secondary | ICD-10-CM | POA: Diagnosis not present

## 2014-06-15 LAB — COMPREHENSIVE METABOLIC PANEL
ALT: 24 U/L (ref 0–35)
AST: 34 U/L (ref 0–37)
Albumin: 4.3 g/dL (ref 3.5–5.2)
Alkaline Phosphatase: 53 U/L (ref 39–117)
BUN: 20 mg/dL (ref 6–23)
CHLORIDE: 104 meq/L (ref 96–112)
CO2: 28 meq/L (ref 19–32)
Calcium: 9.8 mg/dL (ref 8.4–10.5)
Creatinine, Ser: 1.24 mg/dL — ABNORMAL HIGH (ref 0.40–1.20)
GFR: 53.47 mL/min — AB (ref >60.00)
Glucose, Bld: 81 mg/dL (ref 70–99)
Potassium: 3.3 mEq/L — ABNORMAL LOW (ref 3.5–5.1)
Sodium: 140 mEq/L (ref 135–145)
TOTAL PROTEIN: 7.6 g/dL (ref 6.0–8.3)
Total Bilirubin: 0.6 mg/dL (ref 0.2–1.2)

## 2014-06-15 LAB — LIPID PANEL
CHOL/HDL RATIO: 3
Cholesterol: 168 mg/dL (ref 0–200)
HDL: 62 mg/dL (ref >39.00)
LDL Cholesterol: 87 mg/dL (ref 0–99)
NonHDL: 106
TRIGLYCERIDES: 97 mg/dL (ref 0.0–149.0)
VLDL: 19.4 mg/dL (ref 0.0–40.0)

## 2014-06-15 LAB — CBC
HEMATOCRIT: 38.8 % (ref 36.0–46.0)
Hemoglobin: 12.9 g/dL (ref 12.0–15.0)
MCHC: 33.2 g/dL (ref 30.0–36.0)
MCV: 87.6 fl (ref 78.0–100.0)
Platelets: 287 10*3/uL (ref 150.0–400.0)
RBC: 4.44 Mil/uL (ref 3.87–5.11)
RDW: 14.3 % (ref 11.5–15.5)
WBC: 8.4 10*3/uL (ref 4.0–10.5)

## 2014-06-15 LAB — TSH: TSH: 2.76 u[IU]/mL (ref 0.35–4.50)

## 2014-06-15 NOTE — Assessment & Plan Note (Signed)
She does follow with nephrology. BP at goal today. Medication reconciliation reveals that she will be stopping amlodipine/benazepril and starting lisinopril once it is gone (not taking both at once). She is also taking triamterene/hctz, atenolol and potassium. Reviewed of last 3 years of labs indicate no low potassium (except one mildly low value). Will stop potassium supplementation and check labs in 2-3 weeks. Will also check labs today for kidney function. Continue triamterene/hctz and lisinopril and atenolol. BP is on the low end of goal so has room to increase mildly and still be at goal.

## 2014-06-15 NOTE — Assessment & Plan Note (Signed)
Check lipid panel, continue simvastatin. No adverse events.

## 2014-06-15 NOTE — Progress Notes (Signed)
Pre visit review using our clinic review tool, if applicable. No additional management support is needed unless otherwise documented below in the visit note. 

## 2014-06-15 NOTE — Assessment & Plan Note (Signed)
Rechecking kidney function today, BP at goal and no diabetes.

## 2014-06-15 NOTE — Assessment & Plan Note (Signed)
Checking thyroid levels, continue synthroid 50 mcg daily for now.

## 2014-06-15 NOTE — Assessment & Plan Note (Addendum)
She has aged out of colon cancer screening and pap smear and mammogram. Tetanus shot due in 2024, dexa scan done. Does not think she has had shingles shot. Is due for pneumonia booster shot and will get it next time. Given list of screening recommendations. Talked to her about home safety and advanced directives.

## 2014-06-15 NOTE — Progress Notes (Signed)
   Subjective:    Patient ID: April Donaldson, female    DOB: 1931/04/07, 81 y.o.   MRN: [id number]  HPI Here for medicare wellness, no new complaints. Please see A/P for status and treatment of chronic medical problems. She was recently at her granchildren's 5th birthday party and she did a significant amount of work to help set that up.  Diet: heart healthy  Physical activity: sedentary Depression/mood screen: negative Hearing: intact to whispered voice Visual acuity: grossly normal with glasses, performs annual eye exam  ADLs: capable Fall risk: none Home safety: good Cognitive evaluation: intact to orientation, naming, recall and repetition EOL planning: adv directives discussed  I have personally reviewed and have noted 1. The patient's medical and social history - reviewed today no changes 2. Their use of alcohol, tobacco or illicit drugs 3. Their current medications and supplements 4. The patient's functional ability including ADL's, fall risks, home safety risks and hearing or visual impairment. 5. Diet and physical activities 6. Evidence for depression or mood disorders 7. Care team reviewed and updated (available in snapshot)  Review of Systems  Constitutional: Negative for fever, chills, activity change, appetite change and unexpected weight change.  HENT: Negative.   Eyes: Negative.   Respiratory: Negative for cough, chest tightness, shortness of breath and wheezing.   Cardiovascular: Negative for chest pain, palpitations and leg swelling.  Gastrointestinal: Negative for abdominal pain, diarrhea, constipation and abdominal distention.  Musculoskeletal: Negative.   Skin: Negative.   Neurological: Negative.   Psychiatric/Behavioral: Negative.       Objective:   Physical Exam  Constitutional: She is oriented to person, place, and time. She appears well-developed and well-nourished.  Appears younger than stated age  HENT:  Head: Normocephalic and atraumatic.    Eyes: EOM are normal.  Neck: Normal range of motion.  Cardiovascular: Normal rate and regular rhythm.   Pulmonary/Chest: Effort normal and breath sounds normal. No respiratory distress. She has no wheezes.  Abdominal: Soft. Bowel sounds are normal. She exhibits no distension. There is no tenderness. There is no rebound.  Musculoskeletal: She exhibits no edema.  Neurological: She is alert and oriented to person, place, and time. Coordination normal.  Skin: Skin is warm and dry.  Psychiatric: She has a normal mood and affect.   Filed Vitals:   06/15/14 1029  BP: 112/68  Pulse: 61  Temp: 98.1 F (36.7 C)  TempSrc: Oral  Resp: 18  Height: 4\' 11"  (1.499 m)  Weight: 181 lb (82.101 kg)  SpO2: 97%      Assessment & Plan:

## 2014-06-15 NOTE — Patient Instructions (Signed)
We will check on the blood work today. We will call you back with the results.   You can stop taking the potassium pills (if you want to finish what you have at home that's fine but you don't have to). We will have you come back 2-3 weeks after you stop for blood work to make sure it is not low.   Work on doing some light exercise (walking or yoga) 3 times per week to keep your heart and lungs healthy.   Keep up the good work with your health and we will see you back in about 6 months. If you have any problems or questions sooner please call the office.   Health Maintenance Adopting a healthy lifestyle and getting preventive care can go a long way to promote health and wellness. Talk with your health care provider about what schedule of regular examinations is right for you. This is a good chance for you to check in with your provider about disease prevention and staying healthy. In between checkups, there are plenty of things you can do on your own. Experts have done a lot of research about which lifestyle changes and preventive measures are most likely to keep you healthy. Ask your health care provider for more information. WEIGHT AND DIET  Eat a healthy diet  Be sure to include plenty of vegetables, fruits, low-fat dairy products, and lean protein.  Do not eat a lot of foods high in solid fats, added sugars, or salt.  Get regular exercise. This is one of the most important things you can do for your health.  Most adults should exercise for at least 150 minutes each week. The exercise should increase your heart rate and make you sweat (moderate-intensity exercise).  Most adults should also do strengthening exercises at least twice a week. This is in addition to the moderate-intensity exercise.  Maintain a healthy weight  Body mass index (BMI) is a measurement that can be used to identify possible weight problems. It estimates body fat based on height and weight. Your health care provider  can help determine your BMI and help you achieve or maintain a healthy weight.  For females 84 years of age and older:   A BMI below 18.5 is considered underweight.  A BMI of 18.5 to 24.9 is normal.  A BMI of 25 to 29.9 is considered overweight.  A BMI of 30 and above is considered obese.  Watch levels of cholesterol and blood lipids  You should start having your blood tested for lipids and cholesterol at 79 years of age, then have this test every 5 years.  You may need to have your cholesterol levels checked more often if:  Your lipid or cholesterol levels are high.  You are older than 79 years of age.  You are at high risk for heart disease.  CANCER SCREENING   Lung Cancer  Lung cancer screening is recommended for adults 69-58 years old who are at high risk for lung cancer because of a history of smoking.  A yearly low-dose CT scan of the lungs is recommended for people who:  Currently smoke.  Have quit within the past 15 years.  Have at least a 30-pack-year history of smoking. A pack year is smoking an average of one pack of cigarettes a day for 1 year.  Yearly screening should continue until it has been 15 years since you quit.  Yearly screening should stop if you develop a health problem that would prevent you  from having lung cancer treatment.  Breast Cancer  Practice breast self-awareness. This means understanding how your breasts normally appear and feel.  It also means doing regular breast self-exams. Let your health care provider know about any changes, no matter how small.  If you are in your 20s or 30s, you should have a clinical breast exam (CBE) by a health care provider every 1-3 years as part of a regular health exam.  If you are 26 or older, have a CBE every year. Also consider having a breast X-ray (mammogram) every year.  If you have a family history of breast cancer, talk to your health care provider about genetic screening.  If you are at  high risk for breast cancer, talk to your health care provider about having an MRI and a mammogram every year.  Breast cancer gene (BRCA) assessment is recommended for women who have family members with BRCA-related cancers. BRCA-related cancers include:  Breast.  Ovarian.  Tubal.  Peritoneal cancers.  Results of the assessment will determine the need for genetic counseling and BRCA1 and BRCA2 testing. Cervical Cancer Routine pelvic examinations to screen for cervical cancer are no longer recommended for nonpregnant women who are considered low risk for cancer of the pelvic organs (ovaries, uterus, and vagina) and who do not have symptoms. A pelvic examination may be necessary if you have symptoms including those associated with pelvic infections. Ask your health care provider if a screening pelvic exam is right for you.   The Pap test is the screening test for cervical cancer for women who are considered at risk.  If you had a hysterectomy for a problem that was not cancer or a condition that could lead to cancer, then you no longer need Pap tests.  If you are older than 65 years, and you have had normal Pap tests for the past 10 years, you no longer need to have Pap tests.  If you have had past treatment for cervical cancer or a condition that could lead to cancer, you need Pap tests and screening for cancer for at least 20 years after your treatment.  If you no longer get a Pap test, assess your risk factors if they change (such as having a new sexual partner). This can affect whether you should start being screened again.  Some women have medical problems that increase their chance of getting cervical cancer. If this is the case for you, your health care provider may recommend more frequent screening and Pap tests.  The human papillomavirus (HPV) test is another test that may be used for cervical cancer screening. The HPV test looks for the virus that can cause cell changes in the  cervix. The cells collected during the Pap test can be tested for HPV.  The HPV test can be used to screen women 55 years of age and older. Getting tested for HPV can extend the interval between normal Pap tests from three to five years.  An HPV test also should be used to screen women of any age who have unclear Pap test results.  After 79 years of age, women should have HPV testing as often as Pap tests.  Colorectal Cancer  This type of cancer can be detected and often prevented.  Routine colorectal cancer screening usually begins at 79 years of age and continues through 79 years of age.  Your health care provider may recommend screening at an earlier age if you have risk factors for colon cancer.  Your health  care provider may also recommend using home test kits to check for hidden blood in the stool.  A small camera at the end of a tube can be used to examine your colon directly (sigmoidoscopy or colonoscopy). This is done to check for the earliest forms of colorectal cancer.  Routine screening usually begins at age 23.  Direct examination of the colon should be repeated every 5-10 years through 79 years of age. However, you may need to be screened more often if early forms of precancerous polyps or small growths are found. Skin Cancer  Check your skin from head to toe regularly.  Tell your health care provider about any new moles or changes in moles, especially if there is a change in a mole's shape or color.  Also tell your health care provider if you have a mole that is larger than the size of a pencil eraser.  Always use sunscreen. Apply sunscreen liberally and repeatedly throughout the day.  Protect yourself by wearing long sleeves, pants, a wide-brimmed hat, and sunglasses whenever you are outside. HEART DISEASE, DIABETES, AND HIGH BLOOD PRESSURE   Have your blood pressure checked at least every 1-2 years. High blood pressure causes heart disease and increases the risk  of stroke.  If you are between 27 years and 78 years old, ask your health care provider if you should take aspirin to prevent strokes.  Have regular diabetes screenings. This involves taking a blood sample to check your fasting blood sugar level.  If you are at a normal weight and have a low risk for diabetes, have this test once every three years after 79 years of age.  If you are overweight and have a high risk for diabetes, consider being tested at a younger age or more often. PREVENTING INFECTION  Hepatitis B  If you have a higher risk for hepatitis B, you should be screened for this virus. You are considered at high risk for hepatitis B if:  You were born in a country where hepatitis B is common. Ask your health care provider which countries are considered high risk.  Your parents were born in a high-risk country, and you have not been immunized against hepatitis B (hepatitis B vaccine).  You have HIV or AIDS.  You use needles to inject street drugs.  You live with someone who has hepatitis B.  You have had sex with someone who has hepatitis B.  You get hemodialysis treatment.  You take certain medicines for conditions, including cancer, organ transplantation, and autoimmune conditions. Hepatitis C  Blood testing is recommended for:  Everyone born from [birth year] through 1963.  Anyone with known risk factors for hepatitis C. Sexually transmitted infections (STIs)  You should be screened for sexually transmitted infections (STIs) including gonorrhea and chlamydia if:  You are sexually active and are younger than 79 years of age.  You are older than 79 years of age and your health care provider tells you that you are at risk for this type of infection.  Your sexual activity has changed since you were last screened and you are at an increased risk for chlamydia or gonorrhea. Ask your health care provider if you are at risk.  If you do not have HIV, but are at risk, it may be  recommended that you take a prescription medicine daily to prevent HIV infection. This is called pre-exposure prophylaxis (PrEP). You are considered at risk if:  You are sexually active and do not regularly use condoms or  know the HIV status of your partner(s).  You take drugs by injection.  You are sexually active with a partner who has HIV. Talk with your health care provider about whether you are at high risk of being infected with HIV. If you choose to begin PrEP, you should first be tested for HIV. You should then be tested every 3 months for as long as you are taking PrEP.  PREGNANCY   If you are premenopausal and you may become pregnant, ask your health care provider about preconception counseling.  If you may become pregnant, take 400 to 800 micrograms (mcg) of folic acid every day.  If you want to prevent pregnancy, talk to your health care provider about birth control (contraception). OSTEOPOROSIS AND MENOPAUSE   Osteoporosis is a disease in which the bones lose minerals and strength with aging. This can result in serious bone fractures. Your risk for osteoporosis can be identified using a bone density scan.  If you are 56 years of age or older, or if you are at risk for osteoporosis and fractures, ask your health care provider if you should be screened.  Ask your health care provider whether you should take a calcium or vitamin D supplement to lower your risk for osteoporosis.  Menopause may have certain physical symptoms and risks.  Hormone replacement therapy may reduce some of these symptoms and risks. Talk to your health care provider about whether hormone replacement therapy is right for you.  HOME CARE INSTRUCTIONS   Schedule regular health, dental, and eye exams.  Stay current with your immunizations.   Do not use any tobacco products including cigarettes, chewing tobacco, or electronic cigarettes.  If you are pregnant, do not drink alcohol.  If you are  breastfeeding, limit how much and how often you drink alcohol.  Limit alcohol intake to no more than 1 drink per day for nonpregnant women. One drink equals 12 ounces of beer, 5 ounces of wine, or 1 ounces of hard liquor.  Do not use street drugs.  Do not share needles.  Ask your health care provider for help if you need support or information about quitting drugs.  Tell your health care provider if you often feel depressed.  Tell your health care provider if you have ever been abused or do not feel safe at home. Document Released: 08/29/2010 Document Revised: 06/30/2013 Document Reviewed: 01/15/2013 [facility] Patient Information 2015 [place], [place]. This information is not intended to replace advice given to you by your health care provider. Make sure you discuss any questions you have with your health care provider.

## 2014-07-01 ENCOUNTER — Ambulatory Visit (INDEPENDENT_AMBULATORY_CARE_PROVIDER_SITE_OTHER): Payer: Self-pay | Admitting: Internal Medicine

## 2014-07-01 ENCOUNTER — Encounter: Payer: Self-pay | Admitting: Internal Medicine

## 2014-07-01 VITALS — BP 120/64 | HR 60 | Ht 59.0 in | Wt 181.8 lb

## 2014-07-01 DIAGNOSIS — Z1211 Encounter for screening for malignant neoplasm of colon: Secondary | ICD-10-CM

## 2014-07-01 NOTE — Progress Notes (Signed)
   Subjective:    Patient ID: [name], female    DOB: 08/20/1931, 81 y.o.   MRN: [id number]  HPI The patient is a pleasant 81 year old woman with no GI complaints today except occasional extra bowel movements. She has a history of diminutive adenomatous colon polyps in the past.   Review of Systems Negative    Objective:   Physical Exam BP 120/64 mmHg  Pulse 60  Ht 4\' 11"  (1.499 m)  Wt 181 lb 12.8 oz (82.464 kg)  BMI 36.70 kg/m2     Assessment & Plan:   1. Colon cancer screening    She is concerned about the possibility of colon cancer so we have decided to perform a Cologuard test. I have recommended against colonoscopy but if Cologuard is positive would do so. We have reviewed the risks benefits and indications of colonoscopy. If Cologuard is negative no further testing going forward.

## 2014-07-01 NOTE — Patient Instructions (Addendum)
Today we have ordered a cologuard test on you which you will receive in the mail to complete.  We have given you a booklet with information about the test.   I appreciate the opportunity to care for you. Silvano Rusk, M.D., [facility]

## 2014-07-13 LAB — COLOGUARD: Cologuard: NEGATIVE

## 2014-07-23 ENCOUNTER — Encounter: Payer: Self-pay | Admitting: Internal Medicine

## 2014-07-24 ENCOUNTER — Other Ambulatory Visit: Payer: Self-pay | Admitting: Geriatric Medicine

## 2014-07-24 ENCOUNTER — Other Ambulatory Visit (INDEPENDENT_AMBULATORY_CARE_PROVIDER_SITE_OTHER): Payer: Medicare Other

## 2014-07-24 ENCOUNTER — Telehealth: Payer: Self-pay | Admitting: Pulmonary Disease

## 2014-07-24 DIAGNOSIS — E876 Hypokalemia: Secondary | ICD-10-CM

## 2014-07-24 DIAGNOSIS — G4733 Obstructive sleep apnea (adult) (pediatric): Secondary | ICD-10-CM

## 2014-07-24 LAB — BASIC METABOLIC PANEL
BUN: 20 mg/dL (ref 6–23)
CALCIUM: 10.1 mg/dL (ref 8.4–10.5)
CHLORIDE: 102 meq/L (ref 96–112)
CO2: 32 mEq/L (ref 19–32)
Creatinine, Ser: 1.24 mg/dL — ABNORMAL HIGH (ref 0.40–1.20)
GFR: 53.46 mL/min — AB (ref >60.00)
Glucose, Bld: 107 mg/dL — ABNORMAL HIGH (ref 70–99)
Potassium: 3.6 mEq/L (ref 3.5–5.1)
SODIUM: 140 meq/L (ref 135–145)

## 2014-07-24 NOTE — Telephone Encounter (Signed)
Patient requesting results from her ONO, I do not see it in patient's chart.    Franquez - have you received these results?  Please advise.

## 2014-07-24 NOTE — Telephone Encounter (Signed)
Sent message to [facility] inquiring about ONO.. I do not see order in chart for ONO, but patient says that she tested and is requesting results.   To Mindy for follow up

## 2014-07-24 NOTE — Telephone Encounter (Signed)
I have not seen it.

## 2014-07-28 NOTE — Telephone Encounter (Signed)
Spoke with Amy with Respiratory at [facility], states pt had autotitration but no ONO.  States that pt had a 14 day autotitration and this was sent to [place] on 07/07/14.  This is being re-faxed to front fax at Mindy's attn.   Forwarding to Mindy to look out for.

## 2014-07-29 NOTE — Telephone Encounter (Signed)
Download placed in [place] green folder. Please advise thanks

## 2014-07-29 NOTE — Telephone Encounter (Signed)
Let pt know that I have reviewed her download, and she needs a pressure of 13cm to control her sleep apnea.  Her current machine does not do auto, so send an order to her dme to put her pressure on 13.  She is to let us know if this is not comfortable for her.

## 2014-07-30 ENCOUNTER — Telehealth: Payer: Self-pay | Admitting: Pulmonary Disease

## 2014-07-30 NOTE — Telephone Encounter (Signed)
Increased apneas can occur with weight gain, medications that can effect upper airway muscle tone, increasing age, and some medical conditions.  She needs more pressure to adequately treat her increased apneas.

## 2014-07-30 NOTE — Telephone Encounter (Signed)
Copy of report mailed to patient home.

## 2014-07-30 NOTE — Telephone Encounter (Signed)
Patient would like copy of report and would like to know how many episodes she had of apneas.  Patient also wants to know what doctor will be handling her care after Dr. Gwenette Greet leaves?  Please advise.

## 2014-07-30 NOTE — Telephone Encounter (Signed)
Spoke with pt and informed of Dr Janifer Adie recommendations for pressure change.  Order placed.

## 2014-07-30 NOTE — Telephone Encounter (Signed)
Pt can see VS for her future appts.  Pt aware and nothing further needed

## 2014-07-30 NOTE — Telephone Encounter (Signed)
Called spoke with patient Pt voiced her understanding about her CPAP download results and the need to increase her pressure to 13cm.  However, pt is requesting is come additional info/clarification on what exactly happened to cause the need to increase the pressure setting?  ie did she have increased apneas?    Dr Gwenette Greet please advise, thanks.

## 2014-07-31 ENCOUNTER — Encounter: Payer: Self-pay | Admitting: Internal Medicine

## 2014-07-31 ENCOUNTER — Other Ambulatory Visit: Payer: Self-pay

## 2014-07-31 LAB — COLOGUARD

## 2014-07-31 NOTE — Progress Notes (Signed)
Patient ID: April Donaldson, female   DOB: 04-Jan-1932, 81 y.o.   MRN: [id number]  Patient informed of the Negative cologuard results.  Results sent to be scanned into epic.

## 2014-08-11 ENCOUNTER — Encounter: Payer: Self-pay | Admitting: Internal Medicine

## 2014-08-18 ENCOUNTER — Other Ambulatory Visit: Payer: Self-pay | Admitting: Internal Medicine

## 2014-08-25 ENCOUNTER — Telehealth: Payer: Self-pay | Admitting: Pulmonary Disease

## 2014-08-25 DIAGNOSIS — G4733 Obstructive sleep apnea (adult) (pediatric): Secondary | ICD-10-CM

## 2014-08-25 NOTE — Telephone Encounter (Signed)
Patient says that the CPAP machine is blowing out too much air.  She says it feels like the air is "rolling around in her mouth".  She said it makes her mouth extremely dry.  She has turned up the humidifier on it, but she still has trouble with dry mouth.  Her current pressure setting is at 13cm  VS - please advise.

## 2014-08-27 NOTE — Telephone Encounter (Signed)
Pt is aware of recs. Order placed. Staff message sent to [facility]. Nothing further needed

## 2014-08-27 NOTE — Telephone Encounter (Signed)
Patient called back and may be reached [phone].

## 2014-08-27 NOTE — Telephone Encounter (Signed)
lmomtcb x1 

## 2014-08-27 NOTE — Telephone Encounter (Signed)
Have her DME change her CPAP to 11 cm H2O.  Have download sent 2 weeks after this pressure change.

## 2014-09-29 ENCOUNTER — Other Ambulatory Visit: Payer: Self-pay | Admitting: Internal Medicine

## 2014-11-19 ENCOUNTER — Ambulatory Visit (INDEPENDENT_AMBULATORY_CARE_PROVIDER_SITE_OTHER): Payer: Medicare Other

## 2014-11-19 ENCOUNTER — Telehealth: Payer: Self-pay | Admitting: Internal Medicine

## 2014-11-19 ENCOUNTER — Other Ambulatory Visit: Payer: Self-pay | Admitting: Geriatric Medicine

## 2014-11-19 DIAGNOSIS — Z23 Encounter for immunization: Secondary | ICD-10-CM

## 2014-11-19 MED ORDER — ALLOPURINOL 100 MG PO TABS: 100.0000 mg | ORAL_TABLET | Freq: Every day | ORAL | Status: DC

## 2014-11-19 MED ORDER — ATENOLOL 100 MG PO TABS: 100.0000 mg | ORAL_TABLET | Freq: Every day | ORAL | Status: DC

## 2014-11-19 NOTE — Telephone Encounter (Signed)
Pt request to refill for allopurinol (ZYLOPRIM) 100 MG tablet to be send to [facility] and atenolol (TENORMIN) [facility]. Please help

## 2014-11-19 NOTE — Telephone Encounter (Signed)
Sent medications to specified pharmacies.

## 2014-12-15 ENCOUNTER — Ambulatory Visit (INDEPENDENT_AMBULATORY_CARE_PROVIDER_SITE_OTHER): Payer: Medicare Other | Admitting: Internal Medicine

## 2014-12-15 ENCOUNTER — Encounter: Payer: Self-pay | Admitting: Internal Medicine

## 2014-12-15 ENCOUNTER — Other Ambulatory Visit (INDEPENDENT_AMBULATORY_CARE_PROVIDER_SITE_OTHER): Payer: Medicare Other

## 2014-12-15 VITALS — BP 150/80 | HR 57 | Temp 97.9°F | Resp 12 | Ht 59.0 in | Wt 184.8 lb

## 2014-12-15 DIAGNOSIS — I10 Essential (primary) hypertension: Secondary | ICD-10-CM

## 2014-12-15 DIAGNOSIS — E039 Hypothyroidism, unspecified: Secondary | ICD-10-CM

## 2014-12-15 DIAGNOSIS — Z79899 Other long term (current) drug therapy: Secondary | ICD-10-CM | POA: Diagnosis not present

## 2014-12-15 DIAGNOSIS — Z23 Encounter for immunization: Secondary | ICD-10-CM

## 2014-12-15 DIAGNOSIS — L659 Nonscarring hair loss, unspecified: Secondary | ICD-10-CM

## 2014-12-15 LAB — BASIC METABOLIC PANEL
BUN: 18 mg/dL (ref 6–23)
CHLORIDE: 103 meq/L (ref 96–112)
CO2: 30 meq/L (ref 19–32)
Calcium: 10 mg/dL (ref 8.4–10.5)
Creatinine, Ser: 1.16 mg/dL (ref 0.40–1.20)
GFR: 57.68 mL/min — AB (ref >60.00)
GLUCOSE: 95 mg/dL (ref 70–99)
POTASSIUM: 4 meq/L (ref 3.5–5.1)
Sodium: 141 mEq/L (ref 135–145)

## 2014-12-15 LAB — FERRITIN: Ferritin: 34 ng/mL (ref 10.0–291.0)

## 2014-12-15 LAB — TSH: TSH: 2.8 u[IU]/mL (ref 0.35–4.50)

## 2014-12-15 LAB — FOLATE

## 2014-12-15 LAB — VITAMIN B12

## 2014-12-15 NOTE — Patient Instructions (Addendum)
We are checking the blood work today and will call you back with the results.   We are also looking for vitamin levels to see if that is affecting the hair. If we do not find anything you should try some biotin to help with hair and nail strength. You can find this at most drugstores and can ask the pharmacist if you have any problems.

## 2014-12-15 NOTE — Progress Notes (Signed)
Pre visit review using our clinic review tool, if applicable. No additional management support is needed unless otherwise documented below in the visit note. 

## 2014-12-18 DIAGNOSIS — L659 Nonscarring hair loss, unspecified: Secondary | ICD-10-CM | POA: Insufficient documentation

## 2014-12-18 NOTE — Progress Notes (Signed)
   Subjective:    Patient ID: April Donaldson, female    DOB: 1931/05/31, 81 y.o.   MRN: [id number]  HPI The patient is an 81 YO female coming in with concerns about her blood pressure. It has been running slightly higher at home recently. She has had blood pressure problems for many years and is concerned about it. She denies any headaches, chest pains, SOB, abdominal pain, nausea associated with the higher blood pressure. Sometimes it will be 150s/80s which is the highest. She does take her medications as prescribed and about the same time of day every day.  She also is having a new problem of feeling that her hair is thinning and her nails are not as strong. Probably for several months. Has not tried anything for it. No new changes. Does not get her nails done with fake nails. No changes to her medicines or doses.   Review of Systems  Constitutional: Negative for fever, chills, activity change, appetite change and unexpected weight change.  HENT:       Hair thinning  Respiratory: Negative for cough, chest tightness, shortness of breath and wheezing.   Cardiovascular: Negative for chest pain, palpitations and leg swelling.  Gastrointestinal: Negative for abdominal pain, diarrhea, constipation and abdominal distention.  Musculoskeletal: Negative.   Skin: Negative.   Neurological: Negative.   Psychiatric/Behavioral: Negative.       Objective:   Physical Exam  Constitutional: She is oriented to person, place, and time. She appears well-developed and well-nourished.  Appears younger than stated age  HENT:  Head: Normocephalic and atraumatic.  Hair looks about the same, possible thinning at the hairline. No nail breakdown or lines. Slight fragility of the nails.   Eyes: EOM are normal.  Neck: Normal range of motion.  Cardiovascular: Normal rate and regular rhythm.   Pulmonary/Chest: Effort normal and breath sounds normal. No respiratory distress. She has no wheezes.  Abdominal: Soft.  Bowel sounds are normal. She exhibits no distension. There is no tenderness. There is no rebound.  Musculoskeletal: She exhibits no edema.  Neurological: She is alert and oriented to person, place, and time. Coordination normal.  Skin: Skin is warm and dry.  Psychiatric: She has a normal mood and affect.   Filed Vitals:   12/15/14 1033  BP: 150/80  Pulse: 57  Temp: 97.9 F (36.6 C)  TempSrc: Oral  Resp: 12  Height: 4\' 11"  (1.499 m)  Weight: 184 lb 12.8 oz (83.825 kg)  SpO2: 96%      Assessment & Plan:  Prevnar 13 given at visit.

## 2014-12-18 NOTE — Assessment & Plan Note (Signed)
Checking thyroid, iron, folate, B12 levels today and supplement as needed.

## 2014-12-18 NOTE — Assessment & Plan Note (Signed)
Checking BP at home, not above goal today. Continue atenolol, lisinopril, hctz. If continues to be borderline in the future can consider increase of lisinopril dosage and combo with hctz (instead of the triamterene).

## 2015-01-01 ENCOUNTER — Telehealth: Payer: Self-pay | Admitting: Internal Medicine

## 2015-01-01 NOTE — Telephone Encounter (Signed)
Pt request to speak to the assistant concern about the test result, she has a question. Please give her a call back

## 2015-01-05 NOTE — Telephone Encounter (Signed)
Left message for patient to call back with questions.

## 2015-01-06 NOTE — Telephone Encounter (Signed)
Patient called and wanted to know if she should have had a lipid panel and  CBC done along with the labs she just had drawn because she said these tests are normally taken every 6 months. I told her I would get back with her and there were a lot of factors that determined when certain tests are drawn. Her last lipid was in April and it was normal.

## 2015-01-06 NOTE — Telephone Encounter (Signed)
Please call patient. She says she will be home for the rest of day to be able to take your call.

## 2015-01-07 NOTE — Telephone Encounter (Signed)
Generally the soonest between cholesterol tests is 1 year and can be as long as 3 years depending on if patients are on medicine or not. If we make medicine changes then sometimes we will check sooner than 1 year but there is no need for one in her. The CBC also does not need to be done every year and hers in April was normal without any signs of anemia (low blood counts). Unless she is having bleeding problems the CBC does not need to be done.

## 2015-01-07 NOTE — Telephone Encounter (Signed)
Patient aware and will discuss concerns during next visit.

## 2015-02-27 ENCOUNTER — Other Ambulatory Visit: Payer: Self-pay | Admitting: Internal Medicine

## 2015-02-28 HISTORY — PX: CATARACT EXTRACTION, BILATERAL: SHX1313

## 2015-03-19 ENCOUNTER — Ambulatory Visit (INDEPENDENT_AMBULATORY_CARE_PROVIDER_SITE_OTHER): Payer: Medicare Other | Admitting: Internal Medicine

## 2015-03-19 ENCOUNTER — Encounter: Payer: Self-pay | Admitting: Internal Medicine

## 2015-03-19 VITALS — BP 142/80 | HR 58 | Temp 97.8°F | Resp 16 | Ht 59.0 in | Wt 188.0 lb

## 2015-03-19 DIAGNOSIS — H8110 Benign paroxysmal vertigo, unspecified ear: Secondary | ICD-10-CM | POA: Insufficient documentation

## 2015-03-19 DIAGNOSIS — H8111 Benign paroxysmal vertigo, right ear: Secondary | ICD-10-CM

## 2015-03-19 NOTE — Progress Notes (Signed)
Pre visit review using our clinic review tool, if applicable. No additional management support is needed unless otherwise documented below in the visit note. 

## 2015-03-19 NOTE — Assessment & Plan Note (Signed)
Given exercises to help and etiology explained. She will also ask her chiropracter if he can help with this. I advised her to ask if he is trained in this as it is not the same as just adjusting the spine.

## 2015-03-19 NOTE — Progress Notes (Signed)
   Subjective:    Patient ID: April Donaldson, female    DOB: 04-17-1931, 82 y.o.   MRN: [id number]  HPI The patient is an 82 YO female coming in for episodes of dizziness. She had her first one when she was bent down for some time and then raised up. This felt like her eyes were spinning. This disappeared within 15 minutes. She has had several episodes since that time and is concerned. No ear pains, discharge, hearing change. No vision change and asked her eye doctor about it. She needs to get her cataracts fixed. She has tried avoiding the bending and has not had as many episodes lately.   Review of Systems  Constitutional: Negative for fever, chills, activity change, appetite change and unexpected weight change.  Respiratory: Negative for cough, chest tightness, shortness of breath and wheezing.   Cardiovascular: Negative for chest pain, palpitations and leg swelling.  Gastrointestinal: Negative for abdominal pain, diarrhea, constipation and abdominal distention.  Musculoskeletal: Negative.   Skin: Negative.   Neurological: Positive for dizziness. Negative for syncope, speech difficulty, weakness, light-headedness and headaches.  Psychiatric/Behavioral: Negative.       Objective:   Physical Exam  Constitutional: She is oriented to person, place, and time. She appears well-developed and well-nourished.  Appears younger than stated age  HENT:  Head: Normocephalic and atraumatic.  Bilateral TM normal and no wax obstructing the canal.   Eyes: EOM are normal.  Neck: Normal range of motion.  Cardiovascular: Normal rate and regular rhythm.   Pulmonary/Chest: Effort normal and breath sounds normal. No respiratory distress. She has no wheezes.  Abdominal: Soft. Bowel sounds are normal. She exhibits no distension. There is no tenderness. There is no rebound.  Musculoskeletal: She exhibits no edema.  Neurological: She is alert and oriented to person, place, and time. Coordination normal.    Skin: Skin is warm and dry.  Psychiatric: She has a normal mood and affect.   Filed Vitals:   03/19/15 1301 03/19/15 1327  BP: 170/100 142/80  Pulse: 58   Temp: 97.8 F (36.6 C)   TempSrc: Oral   Resp: 16   Height: 4\' 11"  (1.499 m)   Weight: 188 lb (85.276 kg)   SpO2: 97%       Assessment & Plan:

## 2015-03-19 NOTE — Patient Instructions (Signed)
We have given you the exercises to do to help from you getting the dizziness.   Benign Positional Vertigo Vertigo is the feeling that you or your surroundings are moving when they are not. Benign positional vertigo is the most common form of vertigo. The cause of this condition is not serious (is benign). This condition is triggered by certain movements and positions (is positional). This condition can be dangerous if it occurs while you are doing something that could endanger you or others, such as driving.  CAUSES In many cases, the cause of this condition is not known. It may be caused by a disturbance in an area of the inner ear that helps your brain to sense movement and balance. This disturbance can be caused by a viral infection (labyrinthitis), head injury, or repetitive motion. RISK FACTORS This condition is more likely to develop in:  Women.  People who are 62 years of age or older. SYMPTOMS Symptoms of this condition usually happen when you move your head or your eyes in different directions. Symptoms may start suddenly, and they usually last for less than a minute. Symptoms may include:  Loss of balance and falling.  Feeling like you are spinning or moving.  Feeling like your surroundings are spinning or moving.  Nausea and vomiting.  Blurred vision.  Dizziness.  Involuntary eye movement (nystagmus). Symptoms can be mild and cause only slight annoyance, or they can be severe and interfere with daily life. Episodes of benign positional vertigo may return (recur) over time, and they may be triggered by certain movements. Symptoms may improve over time. DIAGNOSIS This condition is usually diagnosed by medical history and a physical exam of the head, neck, and ears. You may be referred to a health care provider who specializes in ear, nose, and throat (ENT) problems (otolaryngologist) or a provider who specializes in disorders of the nervous system (neurologist). You may have  additional testing, including:  MRI.  A CT scan.  Eye movement tests. Your health care provider may ask you to change positions quickly while he or she watches you for symptoms of benign positional vertigo, such as nystagmus. Eye movement may be tested with an electronystagmogram (ENG), caloric stimulation, the Dix-Hallpike test, or the roll test.  An electroencephalogram (EEG). This records electrical activity in your brain.  Hearing tests. TREATMENT Usually, your health care provider will treat this by moving your head in specific positions to adjust your inner ear back to normal. Surgery may be needed in severe cases, but this is rare. In some cases, benign positional vertigo may resolve on its own in 2-4 weeks. HOME CARE INSTRUCTIONS Safety  Move slowly.Avoid sudden body or head movements.  Avoid driving.  Avoid operating heavy machinery.  Avoid doing any tasks that would be dangerous to you or others if a vertigo episode would occur.  If you have trouble walking or keeping your balance, try using a cane for stability. If you feel dizzy or unstable, sit down right away.  Return to your normal activities as told by your health care provider. Ask your health care provider what activities are safe for you. General Instructions  Take over-the-counter and prescription medicines only as told by your health care provider.  Avoid certain positions or movements as told by your health care provider.  Drink enough fluid to keep your urine clear or pale yellow.  Keep all follow-up visits as told by your health care provider. This is important. SEEK MEDICAL CARE IF:  You have  a fever.  Your condition gets worse or you develop new symptoms.  Your family or friends notice any behavioral changes.  Your nausea or vomiting gets worse.  You have numbness or a "pins and needles" sensation. SEEK IMMEDIATE MEDICAL CARE IF:  You have difficulty speaking or moving.  You are always  dizzy.  You faint.  You develop severe headaches.  You have weakness in your legs or arms.  You have changes in your hearing or vision.  You develop a stiff neck.  You develop sensitivity to light.   This information is not intended to replace advice given to you by your health care provider. Make sure you discuss any questions you have with your health care provider.   Document Released: 11/21/2005 Document Revised: 11/04/2014 Document Reviewed: 06/08/2014 Elsevier Interactive Patient Education [employer].

## 2015-04-27 ENCOUNTER — Encounter: Payer: Self-pay | Admitting: Pulmonary Disease

## 2015-04-27 ENCOUNTER — Ambulatory Visit (INDEPENDENT_AMBULATORY_CARE_PROVIDER_SITE_OTHER): Payer: Medicare Other | Admitting: Pulmonary Disease

## 2015-04-27 VITALS — BP 132/76 | HR 59 | Ht 59.0 in | Wt 188.4 lb

## 2015-04-27 DIAGNOSIS — G4733 Obstructive sleep apnea (adult) (pediatric): Secondary | ICD-10-CM | POA: Diagnosis not present

## 2015-04-27 DIAGNOSIS — Z9989 Dependence on other enabling machines and devices: Principal | ICD-10-CM

## 2015-04-27 NOTE — Progress Notes (Signed)
Current Outpatient Prescriptions on File Prior to Visit  Medication Sig  . allopurinol (ZYLOPRIM) 100 MG tablet Take 1 tablet (100 mg total) by mouth daily.  Marland Kitchen aspirin 81 MG tablet Take 81 mg by mouth daily.    Marland Kitchen atenolol (TENORMIN) 100 MG tablet Take 1 tablet (100 mg total) by mouth daily.  . Calcium Carbonate-Vitamin D (CALTRATE 600+D) 600-400 MG-UNIT per tablet Take 1 tablet by mouth 2 (two) times daily.    . dorzolamide-timolol (COSOPT) 22.3-6.8 MG/ML ophthalmic solution Place 1 drop into both eyes 2 (two) times daily.  Marland Kitchen latanoprost (XALATAN) 0.005 % ophthalmic solution Place 1 drop into both eyes at bedtime.   Marland Kitchen levothyroxine (SYNTHROID, LEVOTHROID) 50 MCG tablet TAKE 1 TABLET (50 MCG TOTAL) BY MOUTH DAILY BEFORE BREAKFAST.  Marland Kitchen lisinopril (PRINIVIL,ZESTRIL) 5 MG tablet Take 5 mg by mouth daily. Will start this next month after stopping Lotrel.  . Multiple Vitamins-Minerals (CENTRUM SILVER PO) Take 1 tablet by mouth daily.    . simvastatin (ZOCOR) 20 MG tablet Take 1 tablet (20 mg total) by mouth daily at 6 PM.  . triamterene-hydrochlorothiazide (DYAZIDE) 37.5-25 MG capsule TAKE ONE CAPSULE BY MOUTH ONCE DAILY   No current facility-administered medications on file prior to visit.     Chief Complaint  Patient presents with  . Follow-up    former [facility] pt. seen for OSA. wears CPAP 4hr. everynight. feels pressure is good. no supplies needed. DME:[facility]     Tests PSG 10/14/07 >> [facility]  Past medical hx Glaucoma, HTN, CVA, HLD, Hypothyroidism, IBS, HH, GERD, Back pain, Gout, Anxiety, CKD  Past surgical hx, Allergies, Family hx, Social hx all reviewed.  Vital Signs BP 132/76 mmHg  Pulse 59  Ht 4\' 11"  (1.499 m)  Wt 188 lb 6.4 oz (85.458 kg)  BMI 38.03 kg/m2  SpO2 95%  History of Present Illness TAWYNA BETTERS is a 82 y.o. female with OSA.  She is not sure her machine is working.  She is getting very dry and doesn't feel like the pressure is consistent.  Her CPAP is about 86 or 80  years old.  Physical Exam  General - No distress ENT - No sinus tenderness, no oral exudate, no LAN, MP 4, scalloped tongue Cardiac - s1s2 regular, no murmur Chest - No wheeze/rales/dullness Back - No focal tenderness Abd - Soft, non-tender Ext - No edema Neuro - Normal strength Skin - No rashes Psych - normal mood, and behavior   Assessment/Plan  Obstructive sleep apnea. Plan: - will arrange for new auto CPAP machine - will get download after she has used new machine  Obesity. Plan: - discussed importance of weight loss   Patient Instructions  Will arrange for new CPAP machine  Follow up in 1 year     Chesley Mires, MD [facility] Pager:  [phone]

## 2015-04-27 NOTE — Patient Instructions (Signed)
Will arrange for new CPAP machine Follow up in 1 year 

## 2015-05-07 ENCOUNTER — Ambulatory Visit (INDEPENDENT_AMBULATORY_CARE_PROVIDER_SITE_OTHER): Payer: Medicare Other

## 2015-05-07 VITALS — BP 118/78 | Ht 59.0 in | Wt 187.2 lb

## 2015-05-07 DIAGNOSIS — Z0000 Encounter for general adult medical examination without abnormal findings: Secondary | ICD-10-CM

## 2015-05-07 NOTE — Progress Notes (Signed)
Medical screening examination/treatment/procedure(s) were performed by non-physician practitioner and as supervising physician I was immediately available for consultation/collaboration. I agree with above. Elizabeth A Crawford, MD 

## 2015-05-07 NOTE — Patient Instructions (Addendum)
April Donaldson , Thank you for taking time to come for your Medicare Wellness Visit. I appreciate your ongoing commitment to your health goals. Please review the following plan we discussed and let me know if I can assist you in the future.  Will consider shingles vaccine under Part D Educated to check with insurance regarding coverage of Shingles vaccination on Part D or Part B and may have lower co-pay if provided on the Part D side  Will update advanced directive  May check out the [facility] for exercise   These are the goals we discussed: Goals    . patient     To keep working on staying on healthy; by learning about good food choices and exercise that works for you.       This is a list of the screening recommended for you and due dates:  Health Maintenance  Topic Date Due  . Shingles Vaccine  01/16/1994  . Flu Shot  09/28/2015  . Tetanus Vaccine  03/21/2022  . DEXA scan (bone density measurement)  Completed  . Pneumonia vaccines  Completed     Fall Prevention in the Home  Falls can cause injuries. They can happen to people of all ages. There are many things you can do to make your home safe and to help prevent falls.  WHAT CAN I DO ON THE OUTSIDE OF MY HOME?  Regularly fix the edges of walkways and driveways and fix any cracks.  Remove anything that might make you trip as you walk through a door, such as a raised step or threshold.  Trim any bushes or trees on the path to your home.  Use bright outdoor lighting.  Clear any walking paths of anything that might make someone trip, such as rocks or tools.  Regularly check to see if handrails are loose or broken. Make sure that both sides of any steps have handrails.  Any raised decks and porches should have guardrails on the edges.  Have any leaves, snow, or ice cleared regularly.  Use sand or salt on walking paths during winter.  Clean up any spills in your garage right away. This includes oil or grease  spills. WHAT CAN I DO IN THE BATHROOM?   Use night lights.  Install grab bars by the toilet and in the tub and shower. Do not use towel bars as grab bars.  Use non-skid mats or decals in the tub or shower.  If you need to sit down in the shower, use a plastic, non-slip stool.  Keep the floor dry. Clean up any water that spills on the floor as soon as it happens.  Remove soap buildup in the tub or shower regularly.  Attach bath mats securely with double-sided non-slip rug tape.  Do not have throw rugs and other things on the floor that can make you trip. WHAT CAN I DO IN THE BEDROOM?  Use night lights.  Make sure that you have a light by your bed that is easy to reach.  Do not use any sheets or blankets that are too big for your bed. They should not hang down onto the floor.  Have a firm chair that has side arms. You can use this for support while you get dressed.  Do not have throw rugs and other things on the floor that can make you trip. WHAT CAN I DO IN THE KITCHEN?  Clean up any spills right away.  Avoid walking on wet floors.  Keep items  that you use a lot in easy-to-reach places.  If you need to reach something above you, use a strong step stool that has a grab bar.  Keep electrical cords out of the way.  Do not use floor polish or wax that makes floors slippery. If you must use wax, use non-skid floor wax.  Do not have throw rugs and other things on the floor that can make you trip. WHAT CAN I DO WITH MY STAIRS?  Do not leave any items on the stairs.  Make sure that there are handrails on both sides of the stairs and use them. Fix handrails that are broken or loose. Make sure that handrails are as long as the stairways.  Check any carpeting to make sure that it is firmly attached to the stairs. Fix any carpet that is loose or worn.  Avoid having throw rugs at the top or bottom of the stairs. If you do have throw rugs, attach them to the floor with carpet  tape.  Make sure that you have a light switch at the top of the stairs and the bottom of the stairs. If you do not have them, ask someone to add them for you. WHAT ELSE CAN I DO TO HELP PREVENT FALLS?  Wear shoes that:  Do not have high heels.  Have rubber bottoms.  Are comfortable and fit you well.  Are closed at the toe. Do not wear sandals.  If you use a stepladder:  Make sure that it is fully opened. Do not climb a closed stepladder.  Make sure that both sides of the stepladder are locked into place.  Ask someone to hold it for you, if possible.  Clearly mark and make sure that you can see:  Any grab bars or handrails.  First and last steps.  Where the edge of each step is.  Use tools that help you move around (mobility aids) if they are needed. These include:  Canes.  Walkers.  Scooters.  Crutches.  Turn on the lights when you go into a dark area. Replace any light bulbs as soon as they burn out.  Set up your furniture so you have a clear path. Avoid moving your furniture around.  If any of your floors are uneven, fix them.  If there are any pets around you, be aware of where they are.  Review your medicines with your doctor. Some medicines can make you feel dizzy. This can increase your chance of falling. Ask your doctor what other things that you can do to help prevent falls.   This information is not intended to replace advice given to you by your health care provider. Make sure you discuss any questions you have with your health care provider.   Document Released: 12/10/2008 Document Revised: 06/30/2014 Document Reviewed: 03/20/2014 Elsevier Interactive Patient Education 2016 [place] Maintenance, Female Adopting a healthy lifestyle and getting preventive care can go a long way to promote health and wellness. Talk with your health care provider about what schedule of regular examinations is right for you. This is a good chance for you  to check in with your provider about disease prevention and staying healthy. In between checkups, there are plenty of things you can do on your own. Experts have done a lot of research about which lifestyle changes and preventive measures are most likely to keep you healthy. Ask your health care provider for more information. WEIGHT AND DIET  Eat a healthy diet  Be  sure to include plenty of vegetables, fruits, low-fat dairy products, and lean protein.  Do not eat a lot of foods high in solid fats, added sugars, or salt.  Get regular exercise. This is one of the most important things you can do for your health.  Most adults should exercise for at least 150 minutes each week. The exercise should increase your heart rate and make you sweat (moderate-intensity exercise).  Most adults should also do strengthening exercises at least twice a week. This is in addition to the moderate-intensity exercise.  Maintain a healthy weight  Body mass index (BMI) is a measurement that can be used to identify possible weight problems. It estimates body fat based on height and weight. Your health care provider can help determine your BMI and help you achieve or maintain a healthy weight.  For females 43 years of age and older:   A BMI below 18.5 is considered underweight.  A BMI of 18.5 to 24.9 is normal.  A BMI of 25 to 29.9 is considered overweight.  A BMI of 30 and above is considered obese.  Watch levels of cholesterol and blood lipids  You should start having your blood tested for lipids and cholesterol at 80 years of age, then have this test every 5 years.  You may need to have your cholesterol levels checked more often if:  Your lipid or cholesterol levels are high.  You are older than 80 years of age.  You are at high risk for heart disease.  CANCER SCREENING   Lung Cancer  Lung cancer screening is recommended for adults 34-34 years old who are at high risk for lung cancer because  of a history of smoking.  A yearly low-dose CT scan of the lungs is recommended for people who:  Currently smoke.  Have quit within the past 15 years.  Have at least a 30-pack-year history of smoking. A pack year is smoking an average of one pack of cigarettes a day for 1 year.  Yearly screening should continue until it has been 15 years since you quit.  Yearly screening should stop if you develop a health problem that would prevent you from having lung cancer treatment.  Breast Cancer  Practice breast self-awareness. This means understanding how your breasts normally appear and feel.  It also means doing regular breast self-exams. Let your health care provider know about any changes, no matter how small.  If you are in your 20s or 30s, you should have a clinical breast exam (CBE) by a health care provider every 1-3 years as part of a regular health exam.  If you are 93 or older, have a CBE every year. Also consider having a breast X-ray (mammogram) every year.  If you have a family history of breast cancer, talk to your health care provider about genetic screening.  If you are at high risk for breast cancer, talk to your health care provider about having an MRI and a mammogram every year.  Breast cancer gene (BRCA) assessment is recommended for women who have family members with BRCA-related cancers. BRCA-related cancers include:  Breast.  Ovarian.  Tubal.  Peritoneal cancers.  Results of the assessment will determine the need for genetic counseling and BRCA1 and BRCA2 testing. Cervical Cancer Your health care provider may recommend that you be screened regularly for cancer of the pelvic organs (ovaries, uterus, and vagina). This screening involves a pelvic examination, including checking for microscopic changes to the surface of your cervix (Pap  test). You may be encouraged to have this screening done every 3 years, beginning at age 84.  For women ages 59-65, health care  providers may recommend pelvic exams and Pap testing every 3 years, or they may recommend the Pap and pelvic exam, combined with testing for human papilloma virus (HPV), every 5 years. Some types of HPV increase your risk of cervical cancer. Testing for HPV may also be done on women of any age with unclear Pap test results.  Other health care providers may not recommend any screening for nonpregnant women who are considered low risk for pelvic cancer and who do not have symptoms. Ask your health care provider if a screening pelvic exam is right for you.  If you have had past treatment for cervical cancer or a condition that could lead to cancer, you need Pap tests and screening for cancer for at least 20 years after your treatment. If Pap tests have been discontinued, your risk factors (such as having a new sexual partner) need to be reassessed to determine if screening should resume. Some women have medical problems that increase the chance of getting cervical cancer. In these cases, your health care provider may recommend more frequent screening and Pap tests. Colorectal Cancer  This type of cancer can be detected and often prevented.  Routine colorectal cancer screening usually begins at 80 years of age and continues through 80 years of age.  Your health care provider may recommend screening at an earlier age if you have risk factors for colon cancer.  Your health care provider may also recommend using home test kits to check for hidden blood in the stool.  A small camera at the end of a tube can be used to examine your colon directly (sigmoidoscopy or colonoscopy). This is done to check for the earliest forms of colorectal cancer.  Routine screening usually begins at age 78.  Direct examination of the colon should be repeated every 5-10 years through 80 years of age. However, you may need to be screened more often if early forms of precancerous polyps or small growths are found. Skin  Cancer  Check your skin from head to toe regularly.  Tell your health care provider about any new moles or changes in moles, especially if there is a change in a mole's shape or color.  Also tell your health care provider if you have a mole that is larger than the size of a pencil eraser.  Always use sunscreen. Apply sunscreen liberally and repeatedly throughout the day.  Protect yourself by wearing long sleeves, pants, a wide-brimmed hat, and sunglasses whenever you are outside. HEART DISEASE, DIABETES, AND HIGH BLOOD PRESSURE   High blood pressure causes heart disease and increases the risk of stroke. High blood pressure is more likely to develop in:  People who have blood pressure in the high end of the normal range (130-139/85-89 mm Hg).  People who are overweight or obese.  People who are African American.  If you are 29-18 years of age, have your blood pressure checked every 3-5 years. If you are 51 years of age or older, have your blood pressure checked every year. You should have your blood pressure measured twice--once when you are at a hospital or clinic, and once when you are not at a hospital or clinic. Record the average of the two measurements. To check your blood pressure when you are not at a hospital or clinic, you can use:  An automated blood pressure machine  at a pharmacy.  A home blood pressure monitor.  If you are between 38 years and 109 years old, ask your health care provider if you should take aspirin to prevent strokes.  Have regular diabetes screenings. This involves taking a blood sample to check your fasting blood sugar level.  If you are at a normal weight and have a low risk for diabetes, have this test once every three years after 80 years of age.  If you are overweight and have a high risk for diabetes, consider being tested at a younger age or more often. PREVENTING INFECTION  Hepatitis B  If you have a higher risk for hepatitis B, you should be  screened for this virus. You are considered at high risk for hepatitis B if:  You were born in a country where hepatitis B is common. Ask your health care provider which countries are considered high risk.  Your parents were born in a high-risk country, and you have not been immunized against hepatitis B (hepatitis B vaccine).  You have HIV or AIDS.  You use needles to inject street drugs.  You live with someone who has hepatitis B.  You have had sex with someone who has hepatitis B.  You get hemodialysis treatment.  You take certain medicines for conditions, including cancer, organ transplantation, and autoimmune conditions. Hepatitis C  Blood testing is recommended for:  Everyone born from [birth year] through 1963.  Anyone with known risk factors for hepatitis C. Sexually transmitted infections (STIs)  You should be screened for sexually transmitted infections (STIs) including gonorrhea and chlamydia if:  You are sexually active and are younger than 80 years of age.  You are older than 80 years of age and your health care provider tells you that you are at risk for this type of infection.  Your sexual activity has changed since you were last screened and you are at an increased risk for chlamydia or gonorrhea. Ask your health care provider if you are at risk.  If you do not have HIV, but are at risk, it may be recommended that you take a prescription medicine daily to prevent HIV infection. This is called pre-exposure prophylaxis (PrEP). You are considered at risk if:  You are sexually active and do not regularly use condoms or know the HIV status of your partner(s).  You take drugs by injection.  You are sexually active with a partner who has HIV. Talk with your health care provider about whether you are at high risk of being infected with HIV. If you choose to begin PrEP, you should first be tested for HIV. You should then be tested every 3 months for as long as you are taking  PrEP.  PREGNANCY   If you are premenopausal and you may become pregnant, ask your health care provider about preconception counseling.  If you may become pregnant, take 400 to 800 micrograms (mcg) of folic acid every day.  If you want to prevent pregnancy, talk to your health care provider about birth control (contraception). OSTEOPOROSIS AND MENOPAUSE   Osteoporosis is a disease in which the bones lose minerals and strength with aging. This can result in serious bone fractures. Your risk for osteoporosis can be identified using a bone density scan.  If you are 31 years of age or older, or if you are at risk for osteoporosis and fractures, ask your health care provider if you should be screened.  Ask your health care provider whether you should take a  calcium or vitamin D supplement to lower your risk for osteoporosis.  Menopause may have certain physical symptoms and risks.  Hormone replacement therapy may reduce some of these symptoms and risks. Talk to your health care provider about whether hormone replacement therapy is right for you.  HOME CARE INSTRUCTIONS   Schedule regular health, dental, and eye exams.  Stay current with your immunizations.   Do not use any tobacco products including cigarettes, chewing tobacco, or electronic cigarettes.  If you are pregnant, do not drink alcohol.  If you are breastfeeding, limit how much and how often you drink alcohol.  Limit alcohol intake to no more than 1 drink per day for nonpregnant women. One drink equals 12 ounces of beer, 5 ounces of wine, or 1 ounces of hard liquor.  Do not use street drugs.  Do not share needles.  Ask your health care provider for help if you need support or information about quitting drugs.  Tell your health care provider if you often feel depressed.  Tell your health care provider if you have ever been abused or do not feel safe at home.   This information is not intended to replace advice given  to you by your health care provider. Make sure you discuss any questions you have with your health care provider.   Document Released: 08/29/2010 Document Revised: 03/06/2014 Document Reviewed: 01/15/2013 Elsevier Interactive Patient Education [employer].

## 2015-05-07 NOTE — Progress Notes (Signed)
Subjective:   April Donaldson is a 82 y.o. female who presents for Medicare Annual (Subsequent) preventive examination.  Review of Systems:  HRA assessment completed during visit; April Donaldson The Patient was informed that this wellness visit is to identify risk and educate on how to reduce risk for increase disease through lifestyle changes.   ROS deferred to next [name] and family hx Mother had HD Brother Asthma; Throat cancer/ deceased  3 sisters; one with asthma; one with brain cancer and one with breast cancer  Describes health "under control"  She always keeps her  apts; and tries very hard to take care of herself   Medical issues  Glaucoma under treatment; getting better  HTN (lipids 4/16 cho 168; trig 97; HDL 62; LDL 87 ratio 3) (A1c 5.5)  CVD/ no identified issues  Obesity; Weight has been stable for awhile;    BMI: 37; lost weight x 1 many years ago Is watching what she eats; Eats regular meals  Diet; sometimes eats breakfast at 11 or 12; depends your day; Drinks lemon water and puts honey in it  Sometimes has fruit; walnuts; for late lunch or snacks  Supper; cooks because she knows what she puts in food Likes a lot of vegetables; doesn't fry meats; boils or bakes Ice cream at hs Likes to bake but stopped until  Christmas; now trying to eat less Eats tart cherries to decrease gout; which is controlled on medication as well   Exercise;  Has sciatica ongoing; has chiropractic review and assist approx. 1 time per month In the right hip He has given her exercises; (hx of hurting back lifting a flower pot many years ago)  Musician; has stopped going ; somewhat advanced Has pilates ball and does exercises for back on this per chiropractor (sciatica)  Integrate weight and stretches during the day  Can get on the floor to do some exercises  Treadmill x 5 to 10 minutes at home on some days  SAFETY; live alone one story home  [employer] dtr doesn't live here;  limited family available 4 great grands which she enjoys  Safety reviewed for the home;  Removal of clutter clearing paths through the home,  Railing as needed;  Bathroom safety; bathtub and shower combined /has grab bars  [employer]; good  Smoke detectors yes Firearms safety / keep in a safe place if they exist Driving accidents and seatbelt/ no  Sun protection/ yes;  Stressors / try not to be stressed / distance from stress  Medication review/ states she takes her allopurinol qod; as she eats dark Cherry's. States she did tell her doctor. No episodes of recent gout.   Fall assessment /no  Gait assessment/ good   Mobilization and Functional losses in the last year/can still get on the floor and get up with the help of a chair  Sleep patterns/uses CPAP; feels rested when she awakens   Urinary or fecal incontinence reviewed/ none Minor urgency but not an issue  Counseling: Colonoscopy; 05/20/2009 will not recall (cologuard was neg 06/2014  EKG: 09/2014 Mammogram 04/2013 [facility] no evidence of malignancy / [facility] notifies for repeat Dexa 04/2012 / normal repeat in 5 years   Hearing: 4000hz  in both ears   Ophthalmology exam; just went; April Donaldson; follows glaucoma under treatment; Doing well; eye pressure is down   Immunizations; Zostavax /went to dermatologist once after she had taken steroids; breaking under left breast was dx as shingles and was put on  medication. Was not sure if to take now; she can discuss with April Donaldson;  Educated given on shingles regarding 1:3 have shingles and if patients have had a case, they are likely to have another per shingles rep.   Health advice or referrals  Advanced Directive; may need to update and will take a copy of advanced directive  Sister has issues and may can't do this anymore.  Given a copy of [facility] advanced directive  Current Care Team reviewed and updated  Cardiac Risk Factors include: advanced age (>53men, >50  women);dyslipidemia;family history of premature cardiovascular disease;hypertension;obesity (BMI >30kg/m2)    Objective:     Vitals: BP 118/78 mmHg  Ht 4\' 11"  (1.499 m)  Wt 187 lb 4 oz (84.936 kg)  BMI 37.80 kg/m2  Tobacco History  Smoking status  . Never Smoker   Smokeless tobacco  . Never Used     Counseling given: Yes   Past Medical History  Diagnosis Date  . Glaucoma   . Hypertension   . Cerebrovascular disease   . Venous insufficiency   . Hypercholesteremia   . Hypothyroid   . Obesity   . IBS (irritable bowel syndrome)   . Hx of colonic polyps   . DJD (degenerative joint disease)   . Chronic low back pain   . Gout   . Anxiety   . Cellulitis of leg 03/09/2013 hosp   . H/O hiatal hernia   . GERD (gastroesophageal reflux disease)     no meds  . OSA (obstructive sleep apnea)     NPSG 2009: AHI 20/h. CPAP - follows with [facility]  . Renal insufficiency     one kidney small and non funtioning  . Benign neoplasm of kidney, except pelvis   . Tubular adenoma 2000   Past Surgical History  Procedure Laterality Date  . Hysterctomy and ooperectomy  1970's  . Thyroid lobectomy for a cyst  10/2000    April Donaldson  . Functional endoscopic sinus surgery  05/2003    April Donaldson  . Colonoscopy    . Tonsillectomy    . Appendectomy    . Dilation and curettage of uterus    . Mass excision  2003    lt  . Mass excision Left 09/17/2013    Procedure: EXCISION MASS LEFT HAND;  Surgeon: April Amor, MD;  Location: [place];  Service: Orthopedics;  Laterality: Left;   Family History  Problem Relation Age of Onset  . Heart disease Mother   . Asthma Brother   . Throat cancer Brother 44  . Asthma Sister   . Brain cancer Sister 64  . Breast cancer Sister 37   History  Sexual Activity  . Sexual Activity: Not on file    Outpatient Encounter Prescriptions as of 05/07/2015  Medication Sig  . allopurinol (ZYLOPRIM) 100 MG tablet Take 1 tablet (100 mg  total) by mouth daily.  Marland Kitchen aspirin 81 MG tablet Take 81 mg by mouth daily.    Marland Kitchen atenolol (TENORMIN) 100 MG tablet Take 1 tablet (100 mg total) by mouth daily.  . Calcium Carbonate-Vitamin D (CALTRATE 600+D) 600-400 MG-UNIT per tablet Take 1 tablet by mouth 2 (two) times daily.    . dorzolamide-timolol (COSOPT) 22.3-6.8 MG/ML ophthalmic solution Place 1 drop into both eyes 2 (two) times daily.  Marland Kitchen latanoprost (XALATAN) 0.005 % ophthalmic solution Place 1 drop into both eyes at bedtime.   Marland Kitchen levothyroxine (SYNTHROID, LEVOTHROID) 50 MCG tablet TAKE 1 TABLET (50 MCG  TOTAL) BY MOUTH DAILY BEFORE BREAKFAST.  Marland Kitchen lisinopril (PRINIVIL,ZESTRIL) 5 MG tablet Take 5 mg by mouth daily. Will start this next month after stopping Lotrel.  . Multiple Vitamins-Minerals (CENTRUM SILVER PO) Take 1 tablet by mouth daily.    . simvastatin (ZOCOR) 20 MG tablet Take 1 tablet (20 mg total) by mouth daily at 6 PM.  . triamterene-hydrochlorothiazide (DYAZIDE) 37.5-25 MG capsule TAKE ONE CAPSULE BY MOUTH ONCE DAILY   No facility-administered encounter medications on file as of 05/07/2015.    Activities of Daily Living In your present state of health, do you have any difficulty performing the following activities: 05/07/2015  Hearing? N  Vision? N  Difficulty concentrating or making decisions? N  Walking or climbing stairs? N  Dressing or bathing? N  Doing errands, shopping? N  Preparing Food and eating ? N  Using the Toilet? N  In the past six months, have you accidently leaked urine? N  Do you have problems with loss of bowel control? N  Managing your Medications? N  Managing your Finances? N  Housekeeping or managing your Housekeeping? N    Patient Care Team: Hoyt Koch, MD as PCP - General (Internal Medicine) Kathee Delton, MD (Sleep Medicine) Josue Hector, MD (Cardiology) Gatha Mayer, MD (Gastroenterology) Edrick Oh, MD (Nephrology) Raynelle Bring, MD (Urology) Thornell Sartorius, MD  (Otolaryngology) Donaldson Guys, MD (Ophthalmology) Avon Gully, NP (Obstetrics and Gynecology) Rondel Oh, MD (Ophthalmology) Charlotte Crumb, MD (Orthopedic Surgery)    Assessment:     Exercise Activities and Dietary recommendations Current Exercise Habits: Home exercise routine, Type of exercise: stretching;strength training/weights, Time (Minutes): 35, Frequency (Times/Week): 3, Weekly Exercise (Minutes/Week): 105, Intensity: Moderate  Goals    . patient     To keep working on staying on healthy; by learning about good food choices and exercise that works for you.      Fall Risk Fall Risk  05/07/2015 04/30/2014 09/18/2012  Falls in the past year? No No No   Depression Screen PHQ 2/9 Scores 05/07/2015 04/30/2014 09/18/2012  PHQ - 2 Score 0 0 0     Cognitive Testing MMSE - Mini Mental State Exam 05/07/2015  Not completed: (No Data)   Ad8 score 0; Alert and oriented; engaged in this assessment; Denies any issues at present.   Immunization History  Administered Date(s) Administered  . Influenza Split 11/30/2010, 11/20/2011  . Influenza Whole 12/12/2006, 11/27/2007, 11/25/2008, 11/26/2009  . Influenza,inj,Quad PF,36+ Mos 11/26/2012, 11/06/2013, 11/19/2014  . Pneumococcal Conjugate-13 12/15/2014  . Pneumococcal Polysaccharide-23 03/16/2009  . Td 02/27/2001  . Tdap 03/21/2012   Screening Tests Health Maintenance  Topic Date Due  . ZOSTAVAX  01/16/1994  . INFLUENZA VACCINE  09/28/2015  . TETANUS/TDAP  03/21/2022  . DEXA SCAN  Completed  . PNA vac Low Risk Adult  Completed      Plan:  Will consider shingles vaccine under Part D Educated to check with insurance regarding coverage of Shingles vaccination on Part D or Part B and may have lower co-pay if provided on the Part D side  Will update advanced directive  May check out the [facility] for exercise    During the course of the visit the patient was educated and counseled about the following appropriate  screening and preventive services:   Vaccines to include Pneumoccal, Influenza, Hepatitis B, Td, Zostavax, HCV  Discussed shingles; Educated Part D but will check with April Donaldson prior to receipt  Electrocardiogram 09/2014  Cardiovascular Disease/ obesity;  encouraging increase in exercise as tol  Colorectal cancer screening- cologuard last year in May was neg  Bone density screening 04/2012 repeat in 5 years; 04/2017  Diabetes screening/ neg  Glaucoma screening/ under treatment and doing well   Mammography/[facility] to fup   Nutrition counseling / Discussed; Would like to maintain health; does exercises per chiropractic; discussed finding other exercise she may enjoy; printed activities at the [facility]  Patient Instructions (the written plan) was given to the patient.   Wynetta Fines, RN  05/07/2015

## 2015-06-09 ENCOUNTER — Telehealth: Payer: Self-pay | Admitting: Pulmonary Disease

## 2015-06-09 NOTE — Telephone Encounter (Signed)
Auto CPAP 05/02/15 to 05/31/15 >> used on 30 of 30 nights with average 7 hrs and 9 min.  Average AHI is 0.3 with median CPAP 8 cm H2O and 95 th percentile CPAP 11 cm H20.   Will have my nurse inform pt that CPAP report shows great control of sleep apnea with excellent compliance.

## 2015-06-10 ENCOUNTER — Telehealth: Payer: Self-pay | Admitting: Pulmonary Disease

## 2015-06-10 NOTE — Telephone Encounter (Signed)
Pt is scheduled for her follow up with [facility] 07/05/15 at 3pm Results have been explained to patient, pt expressed understanding. Nothing further needed.

## 2015-06-10 NOTE — Telephone Encounter (Signed)
Spoke with pt and appt made for 07/05/15 @ 3pm. [place] per [employer]. Nothing further needed.

## 2015-06-15 ENCOUNTER — Encounter: Payer: Self-pay | Admitting: Internal Medicine

## 2015-06-15 ENCOUNTER — Other Ambulatory Visit (INDEPENDENT_AMBULATORY_CARE_PROVIDER_SITE_OTHER): Payer: Medicare Other

## 2015-06-15 ENCOUNTER — Ambulatory Visit (INDEPENDENT_AMBULATORY_CARE_PROVIDER_SITE_OTHER): Payer: Medicare Other | Admitting: Internal Medicine

## 2015-06-15 VITALS — BP 122/88 | HR 57 | Temp 98.2°F | Resp 12 | Ht 59.0 in | Wt 189.1 lb

## 2015-06-15 DIAGNOSIS — E785 Hyperlipidemia, unspecified: Secondary | ICD-10-CM

## 2015-06-15 DIAGNOSIS — E039 Hypothyroidism, unspecified: Secondary | ICD-10-CM

## 2015-06-15 DIAGNOSIS — I10 Essential (primary) hypertension: Secondary | ICD-10-CM

## 2015-06-15 DIAGNOSIS — M1A9XX0 Chronic gout, unspecified, without tophus (tophi): Secondary | ICD-10-CM | POA: Diagnosis not present

## 2015-06-15 DIAGNOSIS — M109 Gout, unspecified: Secondary | ICD-10-CM

## 2015-06-15 LAB — COMPREHENSIVE METABOLIC PANEL
ALK PHOS: 50 U/L (ref 39–117)
ALT: 18 U/L (ref 0–35)
AST: 19 U/L (ref 0–37)
Albumin: 4.3 g/dL (ref 3.5–5.2)
BILIRUBIN TOTAL: 0.5 mg/dL (ref 0.2–1.2)
BUN: 17 mg/dL (ref 6–23)
CALCIUM: 10.1 mg/dL (ref 8.4–10.5)
CHLORIDE: 104 meq/L (ref 96–112)
CO2: 29 meq/L (ref 19–32)
CREATININE: 1.08 mg/dL (ref 0.40–1.20)
GFR: 62.56 mL/min (ref >60.00)
GLUCOSE: 94 mg/dL (ref 70–99)
Potassium: 4.2 mEq/L (ref 3.5–5.1)
Sodium: 141 mEq/L (ref 135–145)
Total Protein: 7.9 g/dL (ref 6.0–8.3)

## 2015-06-15 LAB — CBC
HCT: 40 % (ref 36.0–46.0)
Hemoglobin: 13.6 g/dL (ref 12.0–15.0)
MCHC: 33.9 g/dL (ref 30.0–36.0)
MCV: 88.1 fl (ref 78.0–100.0)
Platelets: 316 10*3/uL (ref 150.0–400.0)
RBC: 4.54 Mil/uL (ref 3.87–5.11)
RDW: 14 % (ref 11.5–15.5)
WBC: 9.4 10*3/uL (ref 4.0–10.5)

## 2015-06-15 LAB — LIPID PANEL
CHOLESTEROL: 187 mg/dL (ref 0–200)
HDL: 56.4 mg/dL (ref >39.00)
LDL Cholesterol: 92 mg/dL (ref 0–99)
NonHDL: 130.88
TRIGLYCERIDES: 192 mg/dL — AB (ref 0.0–149.0)
Total CHOL/HDL Ratio: 3
VLDL: 38.4 mg/dL (ref 0.0–40.0)

## 2015-06-15 LAB — TSH: TSH: 2.97 u[IU]/mL (ref 0.35–4.50)

## 2015-06-15 LAB — URIC ACID: Uric Acid, Serum: 7.4 mg/dL — ABNORMAL HIGH (ref 2.4–7.0)

## 2015-06-15 NOTE — Assessment & Plan Note (Signed)
Taking lisinopril and atenolol and BP at goal. Complicated by CKD stage 3 and checking CMP. Adjust as needed.

## 2015-06-15 NOTE — Progress Notes (Signed)
   Subjective:    Patient ID: April Donaldson, female    DOB: 1932/01/20, 82 y.o.   MRN: [id number]  HPI The patient is an 82 YO female coming in for follow up of her lipids (taking simvastatin, no side effects, not complicated), her blood pressure (at goal on atenolol, lisinopril, hctz, complicated by ckd stage 3), and her gout  (no flares recently, taking allopurinol, tries to work on her diet). No new complaints but some mild right arm pain after neck pain (slept on it wrong). Getting slightly better, 1 week ago. Has tried tylenol which is effective.   Review of Systems  Constitutional: Negative for fever, chills, activity change, appetite change and unexpected weight change.  HENT: Negative.   Eyes: Negative.   Respiratory: Negative for cough, chest tightness, shortness of breath and wheezing.   Cardiovascular: Negative for chest pain, palpitations and leg swelling.  Gastrointestinal: Negative for abdominal pain, diarrhea, constipation and abdominal distention.  Musculoskeletal: Positive for myalgias and arthralgias.  Skin: Negative.   Neurological: Negative.   Psychiatric/Behavioral: Negative.       Objective:   Physical Exam  Constitutional: She is oriented to person, place, and time. She appears well-developed and well-nourished.  Appears younger than stated age  HENT:  Head: Normocephalic and atraumatic.  Eyes: EOM are normal.  Neck: Normal range of motion.  Cardiovascular: Normal rate and regular rhythm.   Pulmonary/Chest: Effort normal and breath sounds normal. No respiratory distress. She has no wheezes.  Abdominal: Soft. Bowel sounds are normal. She exhibits no distension. There is no tenderness. There is no rebound.  Musculoskeletal: She exhibits tenderness.  Mild tenderness in the triceps right, no tear apparent.   Neurological: She is alert and oriented to person, place, and time. Coordination normal.  Skin: Skin is warm and dry.  Psychiatric: She has a normal mood  and affect.   Filed Vitals:   06/15/15 1016  BP: 122/88  Pulse: 57  Temp: 98.2 F (36.8 C)  TempSrc: Oral  Resp: 12  Height: 4\' 11"  (1.499 m)  Weight: 189 lb 1.9 oz (85.784 kg)  SpO2: 95%      Assessment & Plan:

## 2015-06-15 NOTE — Assessment & Plan Note (Signed)
Checking lipid panel and adjust as needed. Taking simvastatin 20 mg daily without side effects.

## 2015-06-15 NOTE — Assessment & Plan Note (Signed)
Taking allopurinol daily, checking uric acid level to adjust to goal.

## 2015-06-15 NOTE — Patient Instructions (Signed)
Shoulder Range of Motion Exercises Shoulder range of motion (ROM) exercises are designed to keep the shoulder moving freely. They are often recommended for people who have shoulder pain. MOVEMENT EXERCISE When you are able, do this exercise 5-6 days per week, or as told by your health care provider. Work toward doing 2 sets of 10 swings. Pendulum Exercise How To Do This Exercise Lying Down 1. Lie face-down on a bed with your abdomen close to the side of the bed. 2. Let your arm hang over the side of the bed. 3. Relax your shoulder, arm, and hand. 4. Slowly and gently swing your arm forward and back. Do not use your neck muscles to swing your arm. They should be relaxed. If you are struggling to swing your arm, have someone gently swing it for you. When you do this exercise for the first time, swing your arm at a 15 degree angle for 15 seconds, or swing your arm 10 times. As pain lessens over time, increase the angle of the swing to 30-45 degrees. 5. Repeat steps 1-4 with the other arm. How To Do This Exercise While Standing 1. Stand next to a sturdy chair or table and hold on to it with your hand.  Bend forward at the waist.  Bend your knees slightly.  Relax your other arm and let it hang limp.  Relax the shoulder blade of the arm that is hanging and let it drop.  While keeping your shoulder relaxed, use body motion to swing your arm in small circles. The first time you do this exercise, swing your arm for about 30 seconds or 10 times. When you do it next time, swing your arm for a little longer.  Stand up tall and relax.  Repeat steps 1-7, this time changing the direction of the circles. 2. Repeat steps 1-8 with the other arm. STRETCHING EXERCISES Do these exercises 3-4 times per day on 5-6 days per week or as told by your health care provider. Work toward holding the stretch for 20 seconds. Stretching Exercise 1 1. Lift your arm straight out in front of you. 2. Bend your arm 90  degrees at the elbow (right angle) so your forearm goes across your body and looks like the letter "L." 3. Use your other arm to gently pull the elbow forward and across your body. 4. Repeat steps 1-3 with the other arm. Stretching Exercise 2 You will need a towel or rope for this exercise. 1. Bend one arm behind your back with the palm facing outward. 2. Hold a towel with your other hand. 3. Reach the arm that holds the towel above your head, and bend that arm at the elbow. Your wrist should be behind your neck. 4. Use your free hand to grab the free end of the towel. 5. With the higher hand, gently pull the towel up behind you. 6. With the lower hand, pull the towel down behind you. 7. Repeat steps 1-6 with the other arm. STRENGTHENING EXERCISES Do each of these exercises at four different times of day (sessions) every day or as told by your health care provider. To begin with, repeat each exercise 5 times (repetitions). Work toward doing 3 sets of 12 repetitions or as told by your health care provider. Strengthening Exercise 1 You will need a light weight for this activity. As you grow stronger, you may use a heavier weight. 1. Standing with a weight in your hand, lift your arm straight out to the side   until it is at the same height as your shoulder. 2. Bend your arm at 90 degrees so that your fingers are pointing to the ceiling. 3. Slowly raise your hand until your arm is straight up in the air. 4. Repeat steps 1-3 with the other arm. Strengthening Exercise 2 You will need a light weight for this activity. As you grow stronger, you may use a heavier weight. 1. Standing with a weight in your hand, gradually move your straight arm in an arc, starting at your side, then out in front of you, then straight up over your head. 2. Gradually move your other arm in an arc, starting at your side, then out in front of you, then straight up over your head. 3. Repeat steps 1-2 with the other  arm. Strengthening Exercise 3 You will need an elastic band for this activity. As you grow stronger, gradually increase the size of the bands or increase the number of bands that you use at one time. 1. While standing, hold an elastic band in one hand and raise that arm up in the air. 2. With your other hand, pull down the band until that hand is by your side. 3. Repeat steps 1-2 with the other arm.   This information is not intended to replace advice given to you by your health care provider. Make sure you discuss any questions you have with your health care provider.   Document Released: 11/12/2002 Document Revised: 06/30/2014 Document Reviewed: 02/09/2014 Elsevier Interactive Patient Education 2016 Elsevier Inc.  

## 2015-06-15 NOTE — Progress Notes (Signed)
Pre visit review using our clinic review tool, if applicable. No additional management support is needed unless otherwise documented below in the visit note. 

## 2015-06-15 NOTE — Assessment & Plan Note (Signed)
Checking TSH and adjust as needed. Taking synthroid 50 mcg daily.  

## 2015-07-05 ENCOUNTER — Ambulatory Visit (INDEPENDENT_AMBULATORY_CARE_PROVIDER_SITE_OTHER): Payer: Medicare Other | Admitting: Pulmonary Disease

## 2015-07-05 ENCOUNTER — Encounter: Payer: Self-pay | Admitting: Pulmonary Disease

## 2015-07-05 VITALS — BP 144/92 | HR 57 | Ht 59.0 in | Wt 190.2 lb

## 2015-07-05 DIAGNOSIS — E669 Obesity, unspecified: Secondary | ICD-10-CM | POA: Diagnosis not present

## 2015-07-05 DIAGNOSIS — G4733 Obstructive sleep apnea (adult) (pediatric): Secondary | ICD-10-CM

## 2015-07-05 DIAGNOSIS — Z9989 Dependence on other enabling machines and devices: Principal | ICD-10-CM

## 2015-07-05 NOTE — Patient Instructions (Signed)
Follow up in 1 year.

## 2015-07-05 NOTE — Progress Notes (Signed)
Current Outpatient Prescriptions on File Prior to Visit  Medication Sig  . allopurinol (ZYLOPRIM) 100 MG tablet Take 1 tablet (100 mg total) by mouth daily.  Marland Kitchen aspirin 81 MG tablet Take 81 mg by mouth daily.    Marland Kitchen atenolol (TENORMIN) 100 MG tablet Take 1 tablet (100 mg total) by mouth daily.  . Calcium Carbonate-Vitamin D (CALTRATE 600+D) 600-400 MG-UNIT per tablet Take 1 tablet by mouth 2 (two) times daily.    . dorzolamide-timolol (COSOPT) 22.3-6.8 MG/ML ophthalmic solution Place 1 drop into both eyes 2 (two) times daily.  Marland Kitchen latanoprost (XALATAN) 0.005 % ophthalmic solution Place 1 drop into both eyes at bedtime.   Marland Kitchen levothyroxine (SYNTHROID, LEVOTHROID) 50 MCG tablet TAKE 1 TABLET (50 MCG TOTAL) BY MOUTH DAILY BEFORE BREAKFAST.  Marland Kitchen lisinopril (PRINIVIL,ZESTRIL) 5 MG tablet Take 5 mg by mouth daily. Will start this next month after stopping Lotrel.  . Multiple Vitamins-Minerals (CENTRUM SILVER PO) Take 1 tablet by mouth daily.    . simvastatin (ZOCOR) 20 MG tablet Take 1 tablet (20 mg total) by mouth daily at 6 PM.  . triamterene-hydrochlorothiazide (DYAZIDE) 37.5-25 MG capsule TAKE ONE CAPSULE BY MOUTH ONCE DAILY   No current facility-administered medications on file prior to visit.     Chief Complaint  Patient presents with  . Follow-up    CPAP compliance - Pt received new machine Feb 2017- Wears CPAP nightly. Denies problems with mask/pressure. DME: [facility]     Tests PSG 10/14/07 >> AHI 20 Auto CPAP 06/02/15 to 07/01/15 >> used on 30 of 30 nights with average 7 hrs 3 min.  Average AHI 0.4 with median CPAP 9 and 95 th percentile CPAP 12 cm H2O  Past medical hx Glaucoma, HTN, CVA, HLD, Hypothyroidism, IBS, HH, GERD, Back pain, Gout, Anxiety, CKD  Past surgical hx, Allergies, Family hx, Social hx all reviewed.  Vital Signs BP 144/92 mmHg  Pulse 57  Ht 4\' 11"  (1.499 m)  Wt 190 lb 3.2 oz (86.274 kg)  BMI 38.40 kg/m2  SpO2 98%  History of Present Illness April Donaldson is a 82 y.o.  female with OSA.  She is sleeping better since getting new machine.  She is more rested, and has more energy during the day.  No issues with mask fit.  Physical Exam  General - No distress ENT - No sinus tenderness, no oral exudate, no LAN, MP 4, scalloped tongue Cardiac - s1s2 regular, no murmur Chest - No wheeze/rales/dullness Back - No focal tenderness Abd - Soft, non-tender Ext - No edema Neuro - Normal strength Skin - No rashes Psych - normal mood, and behavior   Assessment/Plan  Obstructive sleep apnea. - continue auto CPAP  Obesity. - discussed importance of weight loss   Patient Instructions  Follow up in 1 year     Chesley Mires, MD [facility] Pager:  [phone] 07/05/2015, 3:30 PM

## 2015-08-05 ENCOUNTER — Telehealth: Payer: Self-pay | Admitting: *Deleted

## 2015-08-05 MED ORDER — ATENOLOL 50 MG PO TABS: 100.0000 mg | ORAL_TABLET | Freq: Every day | ORAL | Status: DC

## 2015-08-05 NOTE — Telephone Encounter (Signed)
Thanks, sent in atenolol 50 mg pills to see if they are available. She will take 2 pills daily.

## 2015-08-05 NOTE — Telephone Encounter (Signed)
Rec'd call stating pt currently have rx for Atenolol 100 mg, but med is on back order with no release date. Requesting alternative...April Donaldson

## 2015-08-09 ENCOUNTER — Other Ambulatory Visit: Payer: Self-pay | Admitting: Geriatric Medicine

## 2015-08-09 MED ORDER — ATENOLOL 50 MG PO TABS: 100.0000 mg | ORAL_TABLET | Freq: Every day | ORAL | Status: DC

## 2015-08-09 NOTE — Telephone Encounter (Signed)
Patient walked in to advise that the [facility]. Please retract the RX and [facility].

## 2015-08-09 NOTE — Telephone Encounter (Signed)
Sent to [facility].

## 2015-08-16 ENCOUNTER — Other Ambulatory Visit: Payer: Self-pay | Admitting: Internal Medicine

## 2015-09-15 ENCOUNTER — Telehealth: Payer: Self-pay | Admitting: Pulmonary Disease

## 2015-09-15 NOTE — Telephone Encounter (Signed)
Received message from Vivian at [facility], he said:   Jamie Kato, CMA            I have sent the ov notes in that should clear this up. I have asked that I be made aware if anything else is needed. I will let you know if we need anything else.. Thanks!          Patient is aware.

## 2015-09-15 NOTE — Telephone Encounter (Signed)
Patient received call from [facility] today, had questions about last OV with Dr. Halford Chessman. Wants our office to fax copy of last OV to [facility] so medicare will pay.  Fax to: [phone]; called Melissa to find out what is needed.  Patient states that it is time for [facility] to take out their autodraft payment for this patient's account and she is afraid they will take out the full cost of her CPAP machine unless this is taken care of.   Awaiting call back from [facility].  Sent STAT staff message to [facility] as well since the draft is tomorrow.

## 2015-10-21 ENCOUNTER — Other Ambulatory Visit: Payer: Self-pay | Admitting: Internal Medicine

## 2015-11-22 ENCOUNTER — Telehealth: Payer: Self-pay | Admitting: Pulmonary Disease

## 2015-11-22 DIAGNOSIS — G4733 Obstructive sleep apnea (adult) (pediatric): Secondary | ICD-10-CM

## 2015-11-22 NOTE — Telephone Encounter (Signed)
lmtcb x1 for pt. 

## 2015-11-23 ENCOUNTER — Ambulatory Visit (INDEPENDENT_AMBULATORY_CARE_PROVIDER_SITE_OTHER): Payer: Medicare Other

## 2015-11-23 DIAGNOSIS — Z23 Encounter for immunization: Secondary | ICD-10-CM

## 2015-11-23 NOTE — Telephone Encounter (Signed)
Called spoke with pt. She states that she needs a new chin strap for her mask. I explained to her that I would send order to [facility] for the chin strap deluxe respironics. She voiced understanding and had no further questions.  Order has been placed. Nothing further needed.

## 2015-11-23 NOTE — Telephone Encounter (Signed)
LMTCB x2  

## 2015-11-23 NOTE — Telephone Encounter (Signed)
Pt returned call - [phone].  She will be there until 2:30 & then she is going to pcp for flu shot & then she will be back home again.

## 2015-12-15 ENCOUNTER — Other Ambulatory Visit (INDEPENDENT_AMBULATORY_CARE_PROVIDER_SITE_OTHER): Payer: Medicare Other

## 2015-12-15 ENCOUNTER — Encounter: Payer: Self-pay | Admitting: Internal Medicine

## 2015-12-15 ENCOUNTER — Ambulatory Visit (INDEPENDENT_AMBULATORY_CARE_PROVIDER_SITE_OTHER): Payer: Medicare Other | Admitting: Internal Medicine

## 2015-12-15 VITALS — BP 118/64 | HR 72 | Temp 98.4°F | Resp 16 | Ht 59.0 in | Wt 186.0 lb

## 2015-12-15 DIAGNOSIS — E785 Hyperlipidemia, unspecified: Secondary | ICD-10-CM

## 2015-12-15 DIAGNOSIS — E039 Hypothyroidism, unspecified: Secondary | ICD-10-CM

## 2015-12-15 DIAGNOSIS — I10 Essential (primary) hypertension: Secondary | ICD-10-CM

## 2015-12-15 DIAGNOSIS — G4733 Obstructive sleep apnea (adult) (pediatric): Secondary | ICD-10-CM

## 2015-12-15 DIAGNOSIS — N183 Chronic kidney disease, stage 3 unspecified: Secondary | ICD-10-CM

## 2015-12-15 DIAGNOSIS — Z0000 Encounter for general adult medical examination without abnormal findings: Secondary | ICD-10-CM

## 2015-12-15 LAB — COMPREHENSIVE METABOLIC PANEL
ALK PHOS: 53 U/L (ref 39–117)
ALT: 18 U/L (ref 0–35)
AST: 20 U/L (ref 0–37)
Albumin: 4.4 g/dL (ref 3.5–5.2)
BUN: 24 mg/dL — AB (ref 6–23)
CO2: 29 meq/L (ref 19–32)
Calcium: 10 mg/dL (ref 8.4–10.5)
Chloride: 103 mEq/L (ref 96–112)
Creatinine, Ser: 1.2 mg/dL (ref 0.40–1.20)
GFR: 55.32 mL/min — AB (ref >60.00)
Glucose, Bld: 93 mg/dL (ref 70–99)
Potassium: 3.7 mEq/L (ref 3.5–5.1)
Sodium: 140 mEq/L (ref 135–145)
Total Bilirubin: 0.5 mg/dL (ref 0.2–1.2)
Total Protein: 8 g/dL (ref 6.0–8.3)

## 2015-12-15 LAB — TSH: TSH: 3.37 u[IU]/mL (ref 0.35–4.50)

## 2015-12-15 LAB — HEMOGLOBIN A1C: HEMOGLOBIN A1C: 5.5 % (ref 4.6–6.5)

## 2015-12-15 NOTE — Progress Notes (Signed)
Pre visit review using our clinic review tool, if applicable. No additional management support is needed unless otherwise documented below in the visit note. 

## 2015-12-15 NOTE — Progress Notes (Signed)
   Subjective:    Patient ID: April Donaldson, female    DOB: 04/10/1931, 82 y.o.   MRN: [id number]  HPI Here for medicare wellness and CPE, no new complaints. Please see A/P for status and treatment of chronic medical problems.   Diet: average Physical activity: sedentary Depression/mood screen: negative Hearing: intact to whispered voice, gets checked every 1-2 years Visual acuity: grossly normal with lens, recent cataract June 2017, performs annual eye exam  ADLs: capable Fall risk: none Home safety: good Cognitive evaluation: intact to orientation, naming, recall and repetition EOL planning: adv directives discussed, not in place  I have personally reviewed and have noted 1. The patient's medical and social history - reviewed today no changes 2. Their use of alcohol, tobacco or illicit drugs 3. Their current medications and supplements 4. The patient's functional ability including ADL's, fall risks, home safety risks and hearing or visual impairment. 5. Diet and physical activities 6. Evidence for depression or mood disorders 7. Care team reviewed and updated (available in snapshot)  Review of Systems  Constitutional: Positive for activity change. Negative for appetite change, fatigue, fever and unexpected weight change.       Not exercising last 2 months  HENT: Negative.   Eyes: Negative.   Respiratory: Negative for cough, chest tightness and shortness of breath.   Cardiovascular: Negative.   Gastrointestinal: Negative.   Musculoskeletal: Negative.   Skin: Negative.   Neurological: Negative.   Psychiatric/Behavioral: Negative.       Objective:   Physical Exam  Constitutional: She is oriented to person, place, and time. She appears well-developed and well-nourished.  HENT:  Head: Normocephalic and atraumatic.  Eyes: EOM are normal.  Neck: Normal range of motion.  Cardiovascular: Normal rate and regular rhythm.   Pulmonary/Chest: Effort normal. No respiratory  distress. She has no wheezes. She has no rales.  Abdominal: Soft. She exhibits no distension. There is no tenderness. There is no rebound.  Musculoskeletal: She exhibits no edema.  Neurological: She is alert and oriented to person, place, and time. Coordination normal.  Skin: Skin is warm and dry.  Psychiatric: She has a normal mood and affect.   Vitals:   12/15/15 0934  BP: 118/64  Pulse: 72  Resp: 16  Temp: 98.4 F (36.9 C)  TempSrc: Oral  SpO2: 96%  Weight: 186 lb (84.4 kg)  Height: 4\' 11"  (1.499 m)      Assessment & Plan:

## 2015-12-15 NOTE — Assessment & Plan Note (Signed)
Checking CMP for stability. BP at goal and screening for diabetes. Taking ACE-I and seeing nephrology.

## 2015-12-15 NOTE — Patient Instructions (Signed)
We are checking the labs today and will call you back with the results.   Work on doing some exercise several times per week.   Health Maintenance, Female Adopting a healthy lifestyle and getting preventive care can go a long way to promote health and wellness. Talk with your health care provider about what schedule of regular examinations is right for you. This is a good chance for you to check in with your provider about disease prevention and staying healthy. In between checkups, there are plenty of things you can do on your own. Experts have done a lot of research about which lifestyle changes and preventive measures are most likely to keep you healthy. Ask your health care provider for more information. WEIGHT AND DIET  Eat a healthy diet  Be sure to include plenty of vegetables, fruits, low-fat dairy products, and lean protein.  Do not eat a lot of foods high in solid fats, added sugars, or salt.  Get regular exercise. This is one of the most important things you can do for your health.  Most adults should exercise for at least 150 minutes each week. The exercise should increase your heart rate and make you sweat (moderate-intensity exercise).  Most adults should also do strengthening exercises at least twice a week. This is in addition to the moderate-intensity exercise.  Maintain a healthy weight  Body mass index (BMI) is a measurement that can be used to identify possible weight problems. It estimates body fat based on height and weight. Your health care provider can help determine your BMI and help you achieve or maintain a healthy weight.  For females 38 years of age and older:   A BMI below 18.5 is considered underweight.  A BMI of 18.5 to 24.9 is normal.  A BMI of 25 to 29.9 is considered overweight.  A BMI of 30 and above is considered obese.  Watch levels of cholesterol and blood lipids  You should start having your blood tested for lipids and cholesterol at 80  years of age, then have this test every 5 years.  You may need to have your cholesterol levels checked more often if:  Your lipid or cholesterol levels are high.  You are older than 80 years of age.  You are at high risk for heart disease.  CANCER SCREENING   Lung Cancer  Lung cancer screening is recommended for adults 60-57 years old who are at high risk for lung cancer because of a history of smoking.  A yearly low-dose CT scan of the lungs is recommended for people who:  Currently smoke.  Have quit within the past 15 years.  Have at least a 30-pack-year history of smoking. A pack year is smoking an average of one pack of cigarettes a day for 1 year.  Yearly screening should continue until it has been 15 years since you quit.  Yearly screening should stop if you develop a health problem that would prevent you from having lung cancer treatment.  Breast Cancer  Practice breast self-awareness. This means understanding how your breasts normally appear and feel.  It also means doing regular breast self-exams. Let your health care provider know about any changes, no matter how small.  If you are in your 20s or 30s, you should have a clinical breast exam (CBE) by a health care provider every 1-3 years as part of a regular health exam.  If you are 3 or older, have a CBE every year. Also consider having a  breast X-ray (mammogram) every year.  If you have a family history of breast cancer, talk to your health care provider about genetic screening.  If you are at high risk for breast cancer, talk to your health care provider about having an MRI and a mammogram every year.  Breast cancer gene (BRCA) assessment is recommended for women who have family members with BRCA-related cancers. BRCA-related cancers include:  Breast.  Ovarian.  Tubal.  Peritoneal cancers.  Results of the assessment will determine the need for genetic counseling and BRCA1 and BRCA2 testing. Cervical  Cancer Your health care provider may recommend that you be screened regularly for cancer of the pelvic organs (ovaries, uterus, and vagina). This screening involves a pelvic examination, including checking for microscopic changes to the surface of your cervix (Pap test). You may be encouraged to have this screening done every 3 years, beginning at age 63.  For women ages 28-65, health care providers may recommend pelvic exams and Pap testing every 3 years, or they may recommend the Pap and pelvic exam, combined with testing for human papilloma virus (HPV), every 5 years. Some types of HPV increase your risk of cervical cancer. Testing for HPV may also be done on women of any age with unclear Pap test results.  Other health care providers may not recommend any screening for nonpregnant women who are considered low risk for pelvic cancer and who do not have symptoms. Ask your health care provider if a screening pelvic exam is right for you.  If you have had past treatment for cervical cancer or a condition that could lead to cancer, you need Pap tests and screening for cancer for at least 20 years after your treatment. If Pap tests have been discontinued, your risk factors (such as having a new sexual partner) need to be reassessed to determine if screening should resume. Some women have medical problems that increase the chance of getting cervical cancer. In these cases, your health care provider may recommend more frequent screening and Pap tests. Colorectal Cancer  This type of cancer can be detected and often prevented.  Routine colorectal cancer screening usually begins at 80 years of age and continues through 80 years of age.  Your health care provider may recommend screening at an earlier age if you have risk factors for colon cancer.  Your health care provider may also recommend using home test kits to check for hidden blood in the stool.  A small camera at the end of a tube can be used to  examine your colon directly (sigmoidoscopy or colonoscopy). This is done to check for the earliest forms of colorectal cancer.  Routine screening usually begins at age 36.  Direct examination of the colon should be repeated every 5-10 years through 80 years of age. However, you may need to be screened more often if early forms of precancerous polyps or small growths are found. Skin Cancer  Check your skin from head to toe regularly.  Tell your health care provider about any new moles or changes in moles, especially if there is a change in a mole's shape or color.  Also tell your health care provider if you have a mole that is larger than the size of a pencil eraser.  Always use sunscreen. Apply sunscreen liberally and repeatedly throughout the day.  Protect yourself by wearing long sleeves, pants, a wide-brimmed hat, and sunglasses whenever you are outside. HEART DISEASE, DIABETES, AND HIGH BLOOD PRESSURE   High blood pressure causes  heart disease and increases the risk of stroke. High blood pressure is more likely to develop in:  People who have blood pressure in the high end of the normal range (130-139/85-89 mm Hg).  People who are overweight or obese.  People who are African American.  If you are 38-75 years of age, have your blood pressure checked every 3-5 years. If you are 30 years of age or older, have your blood pressure checked every year. You should have your blood pressure measured twice--once when you are at a hospital or clinic, and once when you are not at a hospital or clinic. Record the average of the two measurements. To check your blood pressure when you are not at a hospital or clinic, you can use:  An automated blood pressure machine at a pharmacy.  A home blood pressure monitor.  If you are between 39 years and 69 years old, ask your health care provider if you should take aspirin to prevent strokes.  Have regular diabetes screenings. This involves taking a  blood sample to check your fasting blood sugar level.  If you are at a normal weight and have a low risk for diabetes, have this test once every three years after 80 years of age.  If you are overweight and have a high risk for diabetes, consider being tested at a younger age or more often. PREVENTING INFECTION  Hepatitis B  If you have a higher risk for hepatitis B, you should be screened for this virus. You are considered at high risk for hepatitis B if:  You were born in a country where hepatitis B is common. Ask your health care provider which countries are considered high risk.  Your parents were born in a high-risk country, and you have not been immunized against hepatitis B (hepatitis B vaccine).  You have HIV or AIDS.  You use needles to inject street drugs.  You live with someone who has hepatitis B.  You have had sex with someone who has hepatitis B.  You get hemodialysis treatment.  You take certain medicines for conditions, including cancer, organ transplantation, and autoimmune conditions. Hepatitis C  Blood testing is recommended for:  Everyone born from [birth year] through 1963.  Anyone with known risk factors for hepatitis C. Sexually transmitted infections (STIs)  You should be screened for sexually transmitted infections (STIs) including gonorrhea and chlamydia if:  You are sexually active and are younger than 80 years of age.  You are older than 80 years of age and your health care provider tells you that you are at risk for this type of infection.  Your sexual activity has changed since you were last screened and you are at an increased risk for chlamydia or gonorrhea. Ask your health care provider if you are at risk.  If you do not have HIV, but are at risk, it may be recommended that you take a prescription medicine daily to prevent HIV infection. This is called pre-exposure prophylaxis (PrEP). You are considered at risk if:  You are sexually active and do  not regularly use condoms or know the HIV status of your partner(s).  You take drugs by injection.  You are sexually active with a partner who has HIV. Talk with your health care provider about whether you are at high risk of being infected with HIV. If you choose to begin PrEP, you should first be tested for HIV. You should then be tested every 3 months for as long as you are  taking PrEP.  PREGNANCY   If you are premenopausal and you may become pregnant, ask your health care provider about preconception counseling.  If you may become pregnant, take 400 to 800 micrograms (mcg) of folic acid every day.  If you want to prevent pregnancy, talk to your health care provider about birth control (contraception). OSTEOPOROSIS AND MENOPAUSE   Osteoporosis is a disease in which the bones lose minerals and strength with aging. This can result in serious bone fractures. Your risk for osteoporosis can be identified using a bone density scan.  If you are 4 years of age or older, or if you are at risk for osteoporosis and fractures, ask your health care provider if you should be screened.  Ask your health care provider whether you should take a calcium or vitamin D supplement to lower your risk for osteoporosis.  Menopause may have certain physical symptoms and risks.  Hormone replacement therapy may reduce some of these symptoms and risks. Talk to your health care provider about whether hormone replacement therapy is right for you.  HOME CARE INSTRUCTIONS   Schedule regular health, dental, and eye exams.  Stay current with your immunizations.   Do not use any tobacco products including cigarettes, chewing tobacco, or electronic cigarettes.  If you are pregnant, do not drink alcohol.  If you are breastfeeding, limit how much and how often you drink alcohol.  Limit alcohol intake to no more than 1 drink per day for nonpregnant women. One drink equals 12 ounces of beer, 5 ounces of wine, or 1  ounces of hard liquor.  Do not use street drugs.  Do not share needles.  Ask your health care provider for help if you need support or information about quitting drugs.  Tell your health care provider if you often feel depressed.  Tell your health care provider if you have ever been abused or do not feel safe at home.   This information is not intended to replace advice given to you by your health care provider. Make sure you discuss any questions you have with your health care provider.   Document Released: 08/29/2010 Document Revised: 03/06/2014 Document Reviewed: 01/15/2013 Elsevier Interactive Patient Education [employer].

## 2015-12-15 NOTE — Assessment & Plan Note (Signed)
No fatigue or symptoms of over or under replacement. Checking TSH and adjust synthroid 50 mcg daily.

## 2015-12-15 NOTE — Assessment & Plan Note (Signed)
Recent lipid panel at goal on her simvastatin.

## 2015-12-15 NOTE — Assessment & Plan Note (Signed)
BP at goal on atenolol and lisinopril and hctz. Checking CMP and adjust as needed. No side effects.

## 2015-12-15 NOTE — Assessment & Plan Note (Signed)
On CPAP, no problems.

## 2015-12-22 ENCOUNTER — Ambulatory Visit (INDEPENDENT_AMBULATORY_CARE_PROVIDER_SITE_OTHER): Payer: Self-pay | Admitting: Orthopedic Surgery

## 2015-12-25 ENCOUNTER — Other Ambulatory Visit: Payer: Self-pay | Admitting: Internal Medicine

## 2015-12-29 ENCOUNTER — Ambulatory Visit (INDEPENDENT_AMBULATORY_CARE_PROVIDER_SITE_OTHER): Payer: Medicare Other | Admitting: Orthopedic Surgery

## 2015-12-29 ENCOUNTER — Ambulatory Visit (INDEPENDENT_AMBULATORY_CARE_PROVIDER_SITE_OTHER): Payer: Medicare Other

## 2015-12-29 ENCOUNTER — Encounter (INDEPENDENT_AMBULATORY_CARE_PROVIDER_SITE_OTHER): Payer: Self-pay | Admitting: Orthopedic Surgery

## 2015-12-29 DIAGNOSIS — M25561 Pain in right knee: Secondary | ICD-10-CM

## 2015-12-29 DIAGNOSIS — G8929 Other chronic pain: Secondary | ICD-10-CM

## 2015-12-29 NOTE — Progress Notes (Signed)
Office Visit Note   Patient: April Donaldson           Date of Birth: [birth year]           MRN: [id number] Visit Date: 12/29/2015 Requested by: April Broker, MD [id number] [place], [place] [address] PCP: April Broker, MD  Subjective: Chief Complaint  Patient presents with  . Right Knee - Pain  Cleans an ambulatory 82 year old female with right knee pain.  Uses no assistive devices.  States that she felt some medial knee pain several weeks ago but has had no episodes of instability or swelling since that time she is also concerned about a small cyst on the anterior aspect of the knee which is been there for years.  It is not painful and is not changed.  Patient comes in today complaining of right knee pain.  A few weeks ago she was getting up and felt "something move" in the medial knee.  She feels it down deep.  She has a knot on the anterior knee.  It has been there for a good while. No giving way.  She recently saw a chiropractor, had an ultrasound done.  He questions possible tendon issue medially.  Has used ice, has not taken any meds for this. She wants [place] to x ray the knee.                 Review of Systems all systems reviewed negative as they relate to the chief complaint.  No fevers or chills   Assessment & Plan: Visit Diagnoses: No diagnosis found.  Plan: Impression is right knee pain normal radiographs no effusion in the knee.  Patient also has a small cystic like structure measuring 1 x 1 cm freely mobile along the anterior aspect of the tibial tubercle region.  This is something that can be watched.  I think in terms of her knee she April Donaldson have early degenerative meniscal tear but no real mechanical symptoms or effusion with no significant symptoms.  This is something we can watch and consider injection if symptoms recur follow-up with me as needed  Follow-Up Instructions: No Follow-up on file.   Orders:  No orders of the defined types were placed in  this encounter.  No orders of the defined types were placed in this encounter.     Procedures: No procedures performed   Clinical Data: No additional findings.  Objective: Vital Signs: There were no vitals taken for this visit.  Physical Exam  Constitutional: She appears well-developed.  HENT:  Head: Normocephalic.  Eyes: EOM are normal.  Neck: Normal range of motion.  Cardiovascular: Normal rate.   Pulmonary/Chest: Effort normal.  Neurological: She is alert.  Skin: Skin is warm.  Psychiatric: She has a normal mood and affect.    Ortho Exam examination the right knee demonstrates full active and passive range of motion of the knee intact extensor mechanism stable collateral cruciate ligaments palpable pedal pulses small 1 x 1 cm mass freely mobile in the subcutaneous used tissues just anterior to the tibial tubercle.  No proximal lymphadenopathy is present slight tenderness along the medial calf posteriorly but no masses palpable in this area there is no effusion in the knee  Specialty Comments:  No specialty comments available.  Imaging: No results found.   PMFS History: Patient Active Problem List   Diagnosis Date Noted  . Hair thinning 12/18/2014  . Routine general medical examination at a health care facility 06/15/2014  .  Obstructive sleep apnea 11/11/2007  . GLAUCOMA 09/22/2007  . CKD (chronic kidney disease) stage 3, GFR 30-59 ml/min 03/21/2007  . Hypothyroidism 01/04/2007  . Hyperlipidemia 01/04/2007  . GOUT 01/04/2007  . OBESITY 01/04/2007  . Essential hypertension 01/04/2007   Past Medical History:  Diagnosis Date  . Anxiety   . Benign neoplasm of kidney, except pelvis   . Cellulitis of leg 03/09/2013 hosp   . Cerebrovascular disease   . Chronic low back pain   . DJD (degenerative joint disease)   . GERD (gastroesophageal reflux disease)    no meds  . Glaucoma   . Gout   . H/O hiatal hernia   . Hx of colonic polyps   . Hypercholesteremia   .  Hypertension   . Hypothyroid   . IBS (irritable bowel syndrome)   . Obesity   . OSA (obstructive sleep apnea)    NPSG 2009: AHI 20/h. CPAP - follows with [facility]  . Renal insufficiency    one kidney small and non funtioning  . Tubular adenoma 2000  . Venous insufficiency     Family History  Problem Relation Age of Onset  . Heart disease Mother   . Asthma Brother   . Throat cancer Brother 23  . Asthma Sister   . Brain cancer Sister 72  . Breast cancer Sister 62    Past Surgical History:  Procedure Laterality Date  . APPENDECTOMY    . COLONOSCOPY    . DILATION AND CURETTAGE OF UTERUS    . FUNCTIONAL ENDOSCOPIC SINUS SURGERY  05/2003   Dr. Haroldine Donaldson  . hysterctomy and ooperectomy  1970's  . MASS EXCISION  2003   lt  . MASS EXCISION Left 09/17/2013   Procedure: EXCISION MASS LEFT HAND;  Surgeon: April Shores, MD;  Location: [facility];  Service: Orthopedics;  Laterality: Left;  . thyroid lobectomy for a cyst  10/2000   Dr. Haroldine Donaldson  . TONSILLECTOMY     Social History   Occupational History  . retired from worked in [employer] Retired   Social History Main Topics  . Smoking status: Never Smoker  . Smokeless tobacco: Never Used  . Alcohol use No  . Drug use: No  . Sexual activity: Not on file

## 2016-01-17 ENCOUNTER — Telehealth: Payer: Self-pay | Admitting: Internal Medicine

## 2016-01-17 NOTE — Telephone Encounter (Signed)
[employer] called and patient was billed for two physical. If you would please follow up on this, Thank you.

## 2016-02-25 ENCOUNTER — Other Ambulatory Visit: Payer: Self-pay | Admitting: Internal Medicine

## 2016-04-04 ENCOUNTER — Telehealth: Payer: Self-pay | Admitting: Internal Medicine

## 2016-04-04 ENCOUNTER — Encounter (HOSPITAL_COMMUNITY): Payer: Self-pay | Admitting: Emergency Medicine

## 2016-04-04 ENCOUNTER — Emergency Department (HOSPITAL_COMMUNITY)
Admission: EM | Admit: 2016-04-04 | Discharge: 2016-04-05 | Disposition: A | Discharge status: Home / Self Care | Payer: Medicare Other | Attending: Emergency Medicine | Admitting: Emergency Medicine

## 2016-04-04 DIAGNOSIS — I10 Essential (primary) hypertension: Secondary | ICD-10-CM

## 2016-04-04 DIAGNOSIS — I129 Hypertensive chronic kidney disease with stage 1 through stage 4 chronic kidney disease, or unspecified chronic kidney disease: Secondary | ICD-10-CM | POA: Insufficient documentation

## 2016-04-04 DIAGNOSIS — R42 Dizziness and giddiness: Secondary | ICD-10-CM | POA: Diagnosis not present

## 2016-04-04 DIAGNOSIS — Z7982 Long term (current) use of aspirin: Secondary | ICD-10-CM | POA: Insufficient documentation

## 2016-04-04 DIAGNOSIS — E039 Hypothyroidism, unspecified: Secondary | ICD-10-CM | POA: Insufficient documentation

## 2016-04-04 DIAGNOSIS — N183 Chronic kidney disease, stage 3 (moderate): Secondary | ICD-10-CM | POA: Insufficient documentation

## 2016-04-04 DIAGNOSIS — Z79899 Other long term (current) drug therapy: Secondary | ICD-10-CM | POA: Insufficient documentation

## 2016-04-04 DIAGNOSIS — Z8673 Personal history of transient ischemic attack (TIA), and cerebral infarction without residual deficits: Secondary | ICD-10-CM | POA: Diagnosis not present

## 2016-04-04 LAB — URINALYSIS, ROUTINE W REFLEX MICROSCOPIC
Bacteria, UA: NONE SEEN
Bilirubin Urine: NEGATIVE
GLUCOSE, UA: NEGATIVE mg/dL
HGB URINE DIPSTICK: NEGATIVE
Ketones, ur: NEGATIVE mg/dL
Leukocytes, UA: NEGATIVE
Nitrite: NEGATIVE
PH: 6 (ref 5.0–8.0)
Protein, ur: 30 mg/dL — AB
SPECIFIC GRAVITY, URINE: 1.005 (ref 1.005–1.030)

## 2016-04-04 LAB — CBC
HCT: 40.4 % (ref 36.0–46.0)
HEMOGLOBIN: 13.3 g/dL (ref 12.0–15.0)
MCH: 29.4 pg (ref 26.0–34.0)
MCHC: 32.9 g/dL (ref 30.0–36.0)
MCV: 89.2 fL (ref 78.0–100.0)
Platelets: 283 10*3/uL (ref 150–400)
RBC: 4.53 MIL/uL (ref 3.87–5.11)
RDW: 13.4 % (ref 11.5–15.5)
WBC: 9.5 10*3/uL (ref 4.0–10.5)

## 2016-04-04 LAB — COMPREHENSIVE METABOLIC PANEL
ALT: 23 U/L (ref 14–54)
ANION GAP: 10 (ref 5–15)
AST: 28 U/L (ref 15–41)
Albumin: 3.9 g/dL (ref 3.5–5.0)
Alkaline Phosphatase: 56 U/L (ref 38–126)
BUN: 16 mg/dL (ref 6–20)
CALCIUM: 9.9 mg/dL (ref 8.9–10.3)
CHLORIDE: 105 mmol/L (ref 101–111)
CO2: 26 mmol/L (ref 22–32)
CREATININE: 1.19 mg/dL — AB (ref 0.44–1.00)
GFR, EST AFRICAN AMERICAN: 48 mL/min — AB (ref >60)
GFR, EST NON AFRICAN AMERICAN: 41 mL/min — AB (ref >60)
Glucose, Bld: 109 mg/dL — ABNORMAL HIGH (ref 65–99)
Potassium: 4 mmol/L (ref 3.5–5.1)
Sodium: 141 mmol/L (ref 135–145)
Total Bilirubin: 0.6 mg/dL (ref 0.3–1.2)
Total Protein: 7.5 g/dL (ref 6.5–8.1)

## 2016-04-04 MED ORDER — HYDROCHLOROTHIAZIDE 25 MG PO TABS
25.0000 mg | ORAL_TABLET | Freq: Every day | ORAL | Status: DC
  Administered 2016-04-04: 25 mg via ORAL
  Filled 2016-04-04: qty 1

## 2016-04-04 MED ORDER — LISINOPRIL 10 MG PO TABS
5.0000 mg | ORAL_TABLET | Freq: Once | ORAL | Status: AC
  Administered 2016-04-04: 5 mg via ORAL
  Filled 2016-04-04: qty 1

## 2016-04-04 NOTE — ED Triage Notes (Signed)
Pt here for hypertension; pt sts noted SBP over 200 this am; pt sts felt a little dizzy earlier today; pt sts taking her htn meds

## 2016-04-04 NOTE — Telephone Encounter (Signed)
Patient Name: April Donaldson  DOB: 08/07/1931    Initial Comment Caller states her BP is 205/113.   Nurse Assessment  Nurse: Leilani Merl, RN, Heather Date/Time (Eastern Time): 04/04/2016 2:25:33 PM  Confirm and document reason for call. If symptomatic, describe symptoms. ---Caller states her BP is 205/113. she thinks that she feels ok.  Does the patient have any new or worsening symptoms? ---Yes  Will a triage be completed? ---Yes  Related visit to physician within the last 2 weeks? ---No  Does the PT have any chronic conditions? (i.e. diabetes, asthma, etc.) ---Yes  List chronic conditions. ---See MR  Is this a behavioral health or substance abuse call? ---No     Guidelines    Guideline Title Affirmed Question Affirmed Notes  High Blood Pressure [1] BP ? 160 / 100 AND [2] cardiac or neurologic symptoms (e.g., chest pain, difficulty breathing, unsteady gait, blurred vision)    Final Disposition User   Go to ED Now Standifer, RN, Heather    Referrals  GO TO FACILITY UNDECIDED   Disagree/Comply: Comply

## 2016-04-04 NOTE — ED Notes (Signed)
ED Provider at bedside. 

## 2016-04-04 NOTE — ED Provider Notes (Signed)
[facility] DEPT Provider Note   CSN: [id number] Arrival date & time: 04/04/16  1559     History   Chief Complaint Chief Complaint  Patient presents with  . Hypertension    HPI April Donaldson is a 83 y.o. female.  HPI   Woke up this AM, felt like she had difficulty focusing/lightheadedness. No gait instability or vertigo.  Ate, read the paper, checked BP midday and it was 205/113, checked it yesterday 146/88.  Checks it a few times a week, systolics average Q000111Q, 0000000.  Felt like symptoms were worse when ambulating, felt fatigue.  No chest pain, no visual changes, no shortness of breath, no palpitations, no numbness/weakness, no difficulty speaking, no gait instability, no trouble coordinating. Has had no recent changes in medications.  Dr.[facility] developed current antihypertensive regimen as she did not tolerate many other antihypertensives.   Past Medical History:  Diagnosis Date  . Anxiety   . Benign neoplasm of kidney, except pelvis   . Cellulitis of leg 03/09/2013 hosp   . Cerebrovascular disease   . Chronic low back pain   . DJD (degenerative joint disease)   . GERD (gastroesophageal reflux disease)    no meds  . Glaucoma   . Gout   . H/O hiatal hernia   . Hx of colonic polyps   . Hypercholesteremia   . Hypertension   . Hypothyroid   . IBS (irritable bowel syndrome)   . Obesity   . OSA (obstructive sleep apnea)    NPSG 2009: AHI 20/h. CPAP - follows with [facility]  . Renal insufficiency    one kidney small and non funtioning  . Tubular adenoma 2000  . Venous insufficiency     Patient Active Problem List   Diagnosis Date Noted  . Hair thinning 12/18/2014  . Routine general medical examination at a health care facility 06/15/2014  . Obstructive sleep apnea 11/11/2007  . GLAUCOMA 09/22/2007  . CKD (chronic kidney disease) stage 3, GFR 30-59 ml/min 03/21/2007  . Hypothyroidism 01/04/2007  . Hyperlipidemia 01/04/2007  . GOUT 01/04/2007  . OBESITY  01/04/2007  . Essential hypertension 01/04/2007    Past Surgical History:  Procedure Laterality Date  . APPENDECTOMY    . COLONOSCOPY    . DILATION AND CURETTAGE OF UTERUS    . FUNCTIONAL ENDOSCOPIC SINUS SURGERY  05/2003   Dr. Ernesto Rutherford  . hysterctomy and ooperectomy  1970's  . MASS EXCISION  2003   lt  . MASS EXCISION Left 09/17/2013   Procedure: EXCISION MASS LEFT HAND;  Surgeon: Schuyler Amor, MD;  Location: [facility];  Service: Orthopedics;  Laterality: Left;  . thyroid lobectomy for a cyst  10/2000   Dr. Ernesto Rutherford  . TONSILLECTOMY      OB History    No data available       Home Medications    Prior to Admission medications   Medication Sig Start Date End Date Taking? Authorizing Provider  allopurinol (ZYLOPRIM) 100 MG tablet TAKE 1 TABLET (100 MG TOTAL) BY MOUTH DAILY. Patient taking differently: Take 100 mg by mouth daily as needed (for gout flareup).  12/27/15  Yes Hoyt Koch, MD  aspirin 81 MG tablet Take 81 mg by mouth daily.     Yes Historical Provider, MD  atenolol (TENORMIN) 50 MG tablet Take 2 tablets (100 mg total) by mouth daily. Patient taking differently: Take 50 mg by mouth daily.  08/09/15  Yes Hoyt Koch, MD  Calcium Carbonate-Vitamin D (  CALTRATE 600+D) 600-400 MG-UNIT per tablet Take 1 tablet by mouth 2 (two) times daily.     Yes Historical Provider, MD  dorzolamide-timolol (COSOPT) 22.3-6.8 MG/ML ophthalmic solution Place 1 drop into both eyes 2 (two) times daily.   Yes Historical Provider, MD  guaiFENesin (MUCINEX) 600 MG 12 hr tablet Take 600 mg by mouth 2 (two) times daily.   Yes Historical Provider, MD  latanoprost (XALATAN) 0.005 % ophthalmic solution Place 1 drop into both eyes at bedtime.    Yes Historical Provider, MD  levothyroxine (SYNTHROID, LEVOTHROID) 50 MCG tablet TAKE 1 TABLET (50 MCG TOTAL) BY MOUTH DAILY BEFORE BREAKFAST. 10/22/15  Yes Hoyt Koch, MD  lisinopril (PRINIVIL,ZESTRIL) 5 MG tablet  Take 5 mg by mouth daily. Will start this next month after stopping Lotrel. 06/08/14  Yes Historical Provider, MD  Multiple Vitamins-Minerals (CENTRUM SILVER PO) Take 1 tablet by mouth every other day.    Yes Historical Provider, MD  simvastatin (ZOCOR) 20 MG tablet TAKE 1 TABLET DAILY AT 6 P.M. 08/16/15  Yes Hoyt Koch, MD  triamterene-hydrochlorothiazide (DYAZIDE) 37.5-25 MG capsule TAKE ONE CAPSULE BY MOUTH ONCE DAILY 02/25/16  Yes Hoyt Koch, MD    Family History Family History  Problem Relation Age of Onset  . Heart disease Mother   . Asthma Brother   . Throat cancer Brother 72  . Asthma Sister   . Brain cancer Sister 60  . Breast cancer Sister 58    Social History Social History  Substance Use Topics  . Smoking status: Never Smoker  . Smokeless tobacco: Never Used  . Alcohol use No     Allergies   Amlodipine besylate; Clonidine; Diltiazem hcl; Klonopin [clonazepam]; Tetracycline; Doxycycline hyclate; Hydralazine; Iodine; Labetalol hcl; Lidocaine; Olmesartan medoxomil; and Penicillins   Review of Systems Review of Systems  Constitutional: Negative for fever.  HENT: Negative for congestion and sore throat.   Eyes: Negative for visual disturbance.  Respiratory: Negative for cough and shortness of breath.   Cardiovascular: Negative for chest pain.  Gastrointestinal: Negative for abdominal pain, nausea and vomiting.  Genitourinary: Negative for difficulty urinating.  Musculoskeletal: Negative for back pain and neck pain.  Skin: Negative for rash.  Neurological: Positive for dizziness. Negative for syncope, speech difficulty, weakness, numbness and headaches.     Physical Exam Updated Vital Signs BP 150/82   Pulse (!) 57   Temp 98.9 F (37.2 C) (Oral)   Resp 14   SpO2 98%   Physical Exam  Constitutional: She is oriented to person, place, and time. She appears well-developed and well-nourished. No distress.  HENT:  Head: Normocephalic and  atraumatic.  Eyes: Conjunctivae and EOM are normal. Right eye exhibits normal extraocular motion. Left eye exhibits normal extraocular motion. Right pupil is reactive. Left pupil is not round (baseline pupillary irregularity (prior cataract surgery)). Left pupil is reactive.  Hx cataract surgery  Neck: Normal range of motion.  Cardiovascular: Normal rate, regular rhythm, normal heart sounds and intact distal pulses.  Exam reveals no gallop and no friction rub.   No murmur heard. Pulmonary/Chest: Effort normal and breath sounds normal. No respiratory distress. She has no wheezes. She has no rales.  Abdominal: Soft. She exhibits no distension. There is no tenderness. There is no guarding.  Musculoskeletal: She exhibits no edema or tenderness.  Neurological: She is alert and oriented to person, place, and time. She has normal strength. She displays no tremor. No cranial nerve deficit or sensory deficit. Coordination (heel to shin,  finger to nose, rapid movements) and gait normal. GCS eye subscore is 4. GCS verbal subscore is 5. GCS motor subscore is 6.  Normal visual fields  Skin: Skin is warm and dry. No rash noted. She is not diaphoretic. No erythema.  Nursing note and vitals reviewed.    ED Treatments / Results  Labs (all labs ordered are listed, but only abnormal results are displayed) Labs Reviewed  COMPREHENSIVE METABOLIC PANEL - Abnormal; Notable for the following:       Result Value   Glucose, Bld 109 (*)    Creatinine, Ser 1.19 (*)    GFR calc non Af Amer 41 (*)    GFR calc Af Amer 48 (*)    All other components within normal limits  URINALYSIS, ROUTINE W REFLEX MICROSCOPIC - Abnormal; Notable for the following:    Color, Urine STRAW (*)    Protein, ur 30 (*)    Squamous Epithelial / LPF 0-5 (*)    All other components within normal limits  CBC    EKG  EKG Interpretation None       Radiology No results found.  Procedures Procedures (including critical care  time)  Medications Ordered in ED Medications  lisinopril (PRINIVIL,ZESTRIL) tablet 5 mg (5 mg Oral Given 04/04/16 2137)     Initial Impression / Assessment and Plan / ED Course  I have reviewed the triage vital signs and the nursing notes.  Pertinent labs & imaging results that were available during my care of the patient were reviewed by me and considered in my medical decision making (see chart for details).    83yo female with history of htn, hlpd, CKD, hypothyroidism, presents with concern for noting slight dizziness this morning and discovering blood pressures were 123456 systolic. On arrival to the ED, BP was 204/86.  Patient without headache, with normal neurologic exam including normal coordination, gait, visual fields and EOM, no chest pain, no shortness of breath and have low suspicion for hypertensive emergencies including low suspicion for [facility], hypertensive encephalopathy, stroke, MI, aortic dissection, pulmonary edema.  BMP was checked showing unchanged renal function. Provided patient with dose of home medication lisinopril and hctz.  Will increase lisinopril from 5mg  to 10mg  daily.  BP in ED improved to Q000111Q systolic. Pt asymptomatic. Discussed importance of close primary care follow up and reasons to return to the ED in detail. Patient discharged in stable condition with understanding of reasons to return.    Final Clinical Impressions(s) / ED Diagnoses   Final diagnoses:  Essential hypertension  Dizziness    New Prescriptions Discharge Medication List as of 04/05/2016 12:00 AM       Gareth Morgan, MD 04/05/16 1329

## 2016-04-05 NOTE — ED Notes (Signed)
Pt stable and will get cab ride home

## 2016-04-05 NOTE — Discharge Instructions (Signed)
Increase your lisinopril to 10mg  by mouth daily (2 tablets) from 5mg  by mouth daily (1 tablet) and follow up with your primary care physician within one week.

## 2016-04-10 DIAGNOSIS — Z961 Presence of intraocular lens: Secondary | ICD-10-CM | POA: Insufficient documentation

## 2016-04-11 ENCOUNTER — Ambulatory Visit (INDEPENDENT_AMBULATORY_CARE_PROVIDER_SITE_OTHER): Payer: Medicare Other | Admitting: Internal Medicine

## 2016-04-11 ENCOUNTER — Encounter: Payer: Self-pay | Admitting: Internal Medicine

## 2016-04-11 DIAGNOSIS — N183 Chronic kidney disease, stage 3 unspecified: Secondary | ICD-10-CM

## 2016-04-11 DIAGNOSIS — I10 Essential (primary) hypertension: Secondary | ICD-10-CM | POA: Diagnosis not present

## 2016-04-11 NOTE — Patient Instructions (Addendum)
We will have you go back to the 5 mg of lisinopril and keep the rest of the medicines the same.   Watch the blood pressure and if it is staying good keep taking the 5 mg.

## 2016-04-11 NOTE — Progress Notes (Signed)
Pre visit review using our clinic review tool, if applicable. No additional management support is needed unless otherwise documented below in the visit note. 

## 2016-04-11 NOTE — Progress Notes (Signed)
   Subjective:    Patient ID: April Donaldson, female    DOB: [birth year], 83 y.o.   MRN: [id number]  HPI The patient is an 83 YO female coming in for ER follow up (in for high blood pressure and given home meds with an increase in her lisinopril to 10 mg). She has been having slight lightheadedness since the increase. She has her BP monitor with her and 1 reading to 119/70 since the change. This morning was 123/80. She denies headaches, chest pains, SOB. She denies feeling as bad as she did in the ER. She has had multiple adverse effects from blood pressure medications. She did see her nephrologist last week but did not tell them about the lightheadedness. They advised her to stay with 10 mg lisinopril daily. She denies change in diet or exercise to prompt the high reading. She also was not taking any medications over the counter or supplements. She knows only to take tylenol for joint pains if needed.   PMH, [facility], social history reviewed and updated.   Review of Systems  Constitutional: Positive for activity change. Negative for appetite change, chills, fatigue, fever and unexpected weight change.  HENT: Negative.   Eyes: Negative.   Respiratory: Negative.   Cardiovascular: Negative.   Gastrointestinal: Negative.   Musculoskeletal: Negative.   Skin: Negative.   Neurological: Positive for light-headedness. Negative for dizziness, tremors, syncope, facial asymmetry and weakness.  Psychiatric/Behavioral: Negative.       Objective:   Physical Exam  Constitutional: She is oriented to person, place, and time. She appears well-developed and well-nourished.  HENT:  Head: Normocephalic and atraumatic.  Right Ear: External ear normal.  Left Ear: External ear normal.  Eyes: EOM are normal.  Neck: Normal range of motion. No JVD present.  Cardiovascular: Normal rate and regular rhythm.   Pulmonary/Chest: Effort normal and breath sounds normal. No respiratory distress. She has no wheezes. She has no  rales.  Abdominal: Soft. Bowel sounds are normal. She exhibits no distension. There is no tenderness. There is no rebound.  Musculoskeletal: She exhibits no edema.  Lymphadenopathy:    She has no cervical adenopathy.  Neurological: She is alert and oriented to person, place, and time.  Skin: Skin is warm and dry.   Vitals:   04/11/16 1328  BP: 132/80  Pulse: (!) 59  Temp: 98 F (36.7 C)  TempSrc: Oral  SpO2: 96%  Weight: 187 lb (84.8 kg)  Height: 4\' 11"  (1.499 m)      Assessment & Plan:

## 2016-04-12 ENCOUNTER — Encounter: Payer: Self-pay | Admitting: Internal Medicine

## 2016-04-12 NOTE — Assessment & Plan Note (Signed)
BP at goal today on her lisinopril 10 mg and atenolol and hctz but she is having some symptoms which were not present with lisinopril 5 mg. She will switch back to 5 mg lisinopril daily and monitor BP closely and if increasing go back to 10 mg daily.

## 2016-04-12 NOTE — Assessment & Plan Note (Signed)
She understands how important good blood pressure control is to helping her kidney function stay good.

## 2016-04-19 ENCOUNTER — Ambulatory Visit (INDEPENDENT_AMBULATORY_CARE_PROVIDER_SITE_OTHER): Payer: Medicare Other | Admitting: Internal Medicine

## 2016-04-19 ENCOUNTER — Encounter: Payer: Self-pay | Admitting: Internal Medicine

## 2016-04-19 DIAGNOSIS — R42 Dizziness and giddiness: Secondary | ICD-10-CM

## 2016-04-19 NOTE — Patient Instructions (Signed)
We have given you the exercises to help with the dizziness.

## 2016-04-19 NOTE — Progress Notes (Signed)
   Subjective:    Patient ID: April Donaldson, female    DOB: [birth year], 83 y.o.   MRN: [id number]  HPI The patient is an 83 YO female coming in for dizziness episode with getting out of bed in the morning. She turned over and started feeling spinny. She fell back onto the bed and lay there for about 10 minutes and the symptoms faded. She has not had another episode since then. She has made the change to the lisinopril 5 mg daily and no more lightheadedness episodes (she was having these after change to lisinopril 10 mg). She denies fevers or chills.   Review of Systems  Constitutional: Negative for activity change, appetite change, fatigue, fever and unexpected weight change.  Respiratory: Negative.   Cardiovascular: Negative.   Gastrointestinal: Negative.   Musculoskeletal: Negative.   Neurological: Positive for dizziness. Negative for tremors, syncope, weakness, light-headedness, numbness and headaches.  Psychiatric/Behavioral: Negative.       Objective:   Physical Exam  Constitutional: She is oriented to person, place, and time. She appears well-developed and well-nourished.  HENT:  Head: Normocephalic and atraumatic.  Right Ear: External ear normal.  Left Ear: External ear normal.  Mouth/Throat: Oropharynx is clear and moist.  Eyes: EOM are normal.  Neck: Normal range of motion.  Cardiovascular: Normal rate and regular rhythm.   Pulmonary/Chest: Effort normal and breath sounds normal.  Abdominal: Soft.  Musculoskeletal: She exhibits no edema.  Neurological: She is alert and oriented to person, place, and time. Coordination normal.  Skin: Skin is warm and dry.   Vitals:   04/19/16 1046  BP: (!) 142/90  Pulse: (!) 58  Temp: 97.8 F (36.6 C)  TempSrc: Oral  SpO2: 99%  Weight: 188 lb (85.3 kg)  Height: 4\' 11"  (1.499 m)      Assessment & Plan:

## 2016-04-19 NOTE — Progress Notes (Signed)
Pre visit review using our clinic review tool, if applicable. No additional management support is needed unless otherwise documented below in the visit note. 

## 2016-04-19 NOTE — Assessment & Plan Note (Addendum)
Given epley maneuver to use to help with likely BPPV. Does not need meclizine as no more episodes. Already sees ENT and will call for appointment if recurrent episode. Ears are clear on exam ruling out ear infection.

## 2016-05-11 LAB — HM MAMMOGRAPHY

## 2016-05-13 ENCOUNTER — Other Ambulatory Visit: Payer: Self-pay | Admitting: Internal Medicine

## 2016-05-17 ENCOUNTER — Encounter: Payer: Self-pay | Admitting: Internal Medicine

## 2016-05-17 ENCOUNTER — Ambulatory Visit: Payer: Medicare Other | Admitting: Rehabilitative and Restorative Service Providers"

## 2016-05-17 NOTE — Progress Notes (Unsigned)
Results entered and sent to scan  

## 2016-05-19 ENCOUNTER — Ambulatory Visit: Payer: Medicare Other | Attending: Otolaryngology | Admitting: Physical Therapy

## 2016-05-19 ENCOUNTER — Encounter: Payer: Self-pay | Admitting: Physical Therapy

## 2016-05-19 DIAGNOSIS — R42 Dizziness and giddiness: Secondary | ICD-10-CM | POA: Diagnosis present

## 2016-05-19 DIAGNOSIS — H8111 Benign paroxysmal vertigo, right ear: Secondary | ICD-10-CM | POA: Diagnosis present

## 2016-05-19 DIAGNOSIS — R2681 Unsteadiness on feet: Secondary | ICD-10-CM | POA: Diagnosis present

## 2016-05-19 NOTE — Therapy (Signed)
[facility] [address] [place], [place], [address] Phone: [phone]   Fax:  [phone]  Physical Therapy Evaluation  Patient Details  Name: April Donaldson MRN: [id number] Date of Birth: 02-20-1932 Referring Provider: Hoyt Koch, MD (PCP); Thornell Sartorius, MD  Encounter Date: 05/19/2016      PT End of Session - 05/19/16 1512    Visit Number 1   Number of Visits 4   Date for PT Re-Evaluation 06/18/16   Authorization Type [employer] MCR-G code and progress note every 10th visit   PT Start Time 0936   PT Stop Time 1017   PT Time Calculation (min) 41 min   Activity Tolerance Patient tolerated treatment well   Behavior During Therapy [facility] for tasks assessed/performed      Past Medical History:  Diagnosis Date  . Anxiety   . Benign neoplasm of kidney, except pelvis   . Cellulitis of leg 03/09/2013 hosp   . Cerebrovascular disease   . Chronic low back pain   . DJD (degenerative joint disease)   . GERD (gastroesophageal reflux disease)    no meds  . Glaucoma   . Gout   . H/O hiatal hernia   . Hx of colonic polyps   . Hypercholesteremia   . Hypertension   . Hypothyroid   . IBS (irritable bowel syndrome)   . Obesity   . OSA (obstructive sleep apnea)    NPSG 2009: AHI 20/h. CPAP - follows with [facility]  . Renal insufficiency    one kidney small and non funtioning  . Tubular adenoma 2000  . Venous insufficiency     Past Surgical History:  Procedure Laterality Date  . APPENDECTOMY    . COLONOSCOPY    . DILATION AND CURETTAGE OF UTERUS    . FUNCTIONAL ENDOSCOPIC SINUS SURGERY  05/2003   Dr. Ernesto Rutherford  . hysterctomy and ooperectomy  1970's  . MASS EXCISION  2003   lt  . MASS EXCISION Left 09/17/2013   Procedure: EXCISION MASS LEFT HAND;  Surgeon: Schuyler Amor, MD;  Location: [facility];  Service: Orthopedics;  Laterality: Left;  . thyroid lobectomy for a cyst  10/2000   Dr. Ernesto Rutherford   . TONSILLECTOMY      There were no vitals filed for this visit.       Subjective Assessment - 05/19/16 0943    Subjective Pt presents to OPPT for vertigo.  Pt reports one episode of dizziness and eyes spinning when getting OOB, returned to bed for a while and then was able to transition out of bed.  Now, If pt looks up for too long she continues to feel initial symptoms of spinning (putting drops in eyes, putting items on tall shelf).     Pertinent History HTN, LBP, renal insufficiency, venous insufficiency   Patient Stated Goals To find out what is causing the dizziness and how to control it   Currently in Pain? No/denies            [facility] PT Assessment - 05/19/16 0948      Assessment   Medical Diagnosis Vertigo   Referring Provider Hoyt Koch, MD (PCP); Thornell Sartorius, MD   Onset Date/Surgical Date 04/19/16   Next MD Visit Monday   Prior Therapy For shoulder and low back/sciatica     Balance Screen   Has the patient fallen in the past 6 months No   Has the patient had a decrease in  activity level because of a fear of falling?  No   Is the patient reluctant to leave their home because of a fear of falling?  No     Prior Function   Level of Independence Independent            Vestibular Assessment - 05/19/16 0950      Vestibular Assessment   General Observation Denies nausea and vomiting, denies changes in vision or hearing.     Symptom Behavior   Type of Dizziness Spinning   Frequency of Dizziness intermittent   Duration of Dizziness few seconds   Aggravating Factors Looking up to the ceiling   Relieving Factors Comments  return head to neutral     Occulomotor Exam   Occulomotor Alignment Normal   Spontaneous Absent   Gaze-induced Absent   Smooth Pursuits Intact   Saccades Intact   Comment convergence WFL     Vestibulo-Occular Reflex   VOR 1 Head Only (x 1 viewing) Intact   VOR to Slow Head Movement Normal   VOR Cancellation Normal    Comment HIT: + to L     Positional Testing   Dix-Hallpike Dix-Hallpike Right;Dix-Hallpike Left   Horizontal Canal Testing Horizontal Canal Right;Horizontal Canal Left     Dix-Hallpike Right   Dix-Hallpike Right Duration 5 seconds   Dix-Hallpike Right Symptoms Upbeat, right rotatory nystagmus     Dix-Hallpike Left   Dix-Hallpike Left Duration 0   Dix-Hallpike Left Symptoms No nystagmus     Horizontal Canal Right   Horizontal Canal Right Duration 0   Horizontal Canal Right Symptoms Normal     Horizontal Canal Left   Horizontal Canal Left Duration 0   Horizontal Canal Left Symptoms Normal                Vestibular Treatment/Exercise - 05/19/16 1006      Vestibular Treatment/Exercise   Vestibular Treatment Provided Canalith Repositioning   Canalith Repositioning Epley Manuever Right      EPLEY MANUEVER RIGHT   Number of Reps  1   Overall Response Improved Symptoms               PT Education - 05/19/16 1512    Education provided Yes   Education Details Clinical findings, PT POC and goals   Person(s) Educated Patient   Methods Explanation   Comprehension Verbalized understanding             PT Long Term Goals - 05/19/16 1520      PT LONG TERM GOAL #1   Title (TARGET DATE FOR ALL LTG 06/18/16) Pt will demonstrate independence with self-treatment and vestibular HEP   Time 4   Period Weeks   Status New     PT LONG TERM GOAL #2   Title Pt will report 0/5 dizziness with looking up to put drops in eyes or reach for items on tall shelving   Time 4   Period Weeks   Status New     PT LONG TERM GOAL #3   Title Pt will demonstrate negative positional testing for all canals   Time 4   Period Weeks   Status New               Plan - 05/19/16 1513    Clinical Impression Statement Pt is a 83 year old female presenting to OPPT neuro for low complexity PT evaluation for vertigo. Pt's PMH significant for the following: LBP, venous insufficiency,  hypothyroidism, gout, glaucoma, essential HTN,  kidney disease. The following deficits were noted during pt's exam: R posterior canal canalithiasis and imbalance.  Pt would benefit from skilled PT to address these impairments and functional limitations to maximize functional mobility independence and reduce falls risk.   Rehab Potential Excellent   Clinical Impairments Affecting Rehab Potential low back pain limits testing and treatment positions   PT Frequency 1x / week   PT Duration 4 weeks   PT Treatment/Interventions ADLs/Self Care Home Management;Canalith Repostioning;Functional mobility training;Therapeutic activities;Balance training;Neuromuscular re-education;Patient/family education;Vestibular   PT Next Visit Plan Re-assess all canals and treat as indicated.  Further assess balance and gaze stability and provide exercises as indicated   Consulted and Agree with Plan of Care Patient      Patient will benefit from skilled therapeutic intervention in order to improve the following deficits and impairments:  Decreased balance, Dizziness  Visit Diagnosis: BPPV (benign paroxysmal positional vertigo), right  Dizziness and giddiness  Unsteadiness on feet      G-Codes - 20-May-2016 1529    Functional Assessment Tool Used (Outpatient Only) clinical judgment, positional testing   Functional Limitation Changing and maintaining body position   Changing and Maintaining Body Position Current Status (I5027) At least 1 percent but less than 20 percent impaired, limited or restricted   Changing and Maintaining Body Position Goal Status (X4128) 0 percent impaired, limited or restricted       Problem List Patient Active Problem List   Diagnosis Date Noted  . Vertigo 04/19/2016  . Hair thinning 12/18/2014  . Routine general medical examination at a health care facility 06/15/2014  . Obstructive sleep apnea 11/11/2007  . GLAUCOMA 09/22/2007  . CKD (chronic kidney disease) stage 3, GFR 30-59  ml/min 03/21/2007  . Hypothyroidism 01/04/2007  . Hyperlipidemia 01/04/2007  . GOUT 01/04/2007  . OBESITY 01/04/2007  . Essential hypertension 01/04/2007   Raylene Everts, PT, DPT May 20, 2016    3:30 PM    [facility] [address] [place], [place], [address] Phone: [phone]   Fax:  [phone]  Name: April Donaldson MRN: [id number] Date of Birth: 09/01/1931

## 2016-06-02 ENCOUNTER — Ambulatory Visit: Payer: Medicare Other | Attending: Otolaryngology | Admitting: Rehabilitative and Restorative Service Providers"

## 2016-06-02 DIAGNOSIS — H8111 Benign paroxysmal vertigo, right ear: Secondary | ICD-10-CM | POA: Insufficient documentation

## 2016-06-02 DIAGNOSIS — R2681 Unsteadiness on feet: Secondary | ICD-10-CM | POA: Diagnosis present

## 2016-06-02 DIAGNOSIS — R42 Dizziness and giddiness: Secondary | ICD-10-CM | POA: Diagnosis present

## 2016-06-02 NOTE — Patient Instructions (Signed)
Gaze Stabilization: Sitting    Keeping eyes on target on wall 3 feet away, and move head side to side for _30-60___ seconds. Do __2-3__ sessions per day.  Copyright  VHI. All rights reserved.   Gaze Stabilization: Tip Card  1.Target must remain in focus, not blurry, and appear stationary while head is in motion. 2.Perform exercises with small head movements (45 to either side of midline). 3.Increase speed of head motion so long as target is in focus. 4.If you wear eyeglasses, be sure you can see target through lens (therapist will give specific instructions for bifocal / progressive lenses). 5.These exercises may provoke dizziness or nausea. Work through these symptoms. If too dizzy, slow head movement slightly. Rest between each exercise. 6.Exercises demand concentration; avoid distractions.  Copyright  VHI. All rights reserved.

## 2016-06-02 NOTE — Therapy (Signed)
[facility] [address] [place], [place], [address] Phone: [phone]   Fax:  [phone]  Physical Therapy Treatment  Patient Details  Name: April Donaldson MRN: [id number] Date of Birth: [birth year] Referring Provider: Hoyt Koch, MD (PCP); Thornell Sartorius, MD  Encounter Date: 06/02/2016      PT End of Session - 06/02/16 1733    Visit Number 2   Number of Visits 4   Date for PT Re-Evaluation 06/18/16   Authorization Type [employer] MCR-G code and progress note every 10th visit   PT Start Time 1540   PT Stop Time 1610   PT Time Calculation (min) 30 min   Activity Tolerance Patient tolerated treatment well   Behavior During Therapy [facility] for tasks assessed/performed      Past Medical History:  Diagnosis Date  . Anxiety   . Benign neoplasm of kidney, except pelvis   . Cellulitis of leg 03/09/2013 hosp   . Cerebrovascular disease   . Chronic low back pain   . DJD (degenerative joint disease)   . GERD (gastroesophageal reflux disease)    no meds  . Glaucoma   . Gout   . H/O hiatal hernia   . Hx of colonic polyps   . Hypercholesteremia   . Hypertension   . Hypothyroid   . IBS (irritable bowel syndrome)   . Obesity   . OSA (obstructive sleep apnea)    NPSG 2009: AHI 20/h. CPAP - follows with [facility]  . Renal insufficiency    one kidney small and non funtioning  . Tubular adenoma 2000  . Venous insufficiency     Past Surgical History:  Procedure Laterality Date  . APPENDECTOMY    . COLONOSCOPY    . DILATION AND CURETTAGE OF UTERUS    . FUNCTIONAL ENDOSCOPIC SINUS SURGERY  05/2003   Dr. Ernesto Rutherford  . hysterctomy and ooperectomy  1970's  . MASS EXCISION  2003   lt  . MASS EXCISION Left 09/17/2013   Procedure: EXCISION MASS LEFT HAND;  Surgeon: Schuyler Amor, MD;  Location: [facility];  Service: Orthopedics;  Laterality: Left;  . thyroid lobectomy for a cyst  10/2000   Dr. Ernesto Rutherford   . TONSILLECTOMY      There were no vitals filed for this visit.      Subjective Assessment - 06/02/16 1547    Subjective "I haven't had any more of the spinning episodes."  She notes no problems with balance.     Pertinent History HTN, LBP, renal insufficiency, venous insufficiency   Patient Stated Goals To find out what is causing the dizziness and how to control it   Currently in Pain? Yes  has appt with MD to see for SI joint pain noting "it comes and goes".  PT to monitor, but will not address due to nature of goals/referral.                Vestibular Assessment - 06/02/16 1549      Vestibular Assessment   General Observation Patient sees Dr. Noberto Retort for low back/SI joint pain.      Positional Testing   Dix-Hallpike Dix-Hallpike Right;Dix-Hallpike Left   Horizontal Canal Testing Horizontal Canal Right;Horizontal Canal Left     Dix-Hallpike Right   Dix-Hallpike Right Duration none   Dix-Hallpike Right Symptoms No nystagmus     Dix-Hallpike Left   Dix-Hallpike Left Duration none   Dix-Hallpike Left Symptoms No nystagmus  Horizontal Canal Right   Horizontal Canal Right Duration none   Horizontal Canal Right Symptoms Normal     Horizontal Canal Left   Horizontal Canal Left Duration none   Horizontal Canal Left Symptoms Normal                  Vestibular Treatment/Exercise - 06/02/16 1735      Vestibular Treatment/Exercise   Vestibular Treatment Provided Gaze   Gaze Exercises X1 Viewing Horizontal     X1 Viewing Horizontal   Foot Position seated   Comments with cues for visual fixation and purpose of exercise provided.                PT Education - 06/02/16 1733    Education provided Yes   Education Details HEP: gaze x 1 for home   Person(s) Educated Patient   Methods Explanation;Demonstration;Handout   Comprehension Verbalized understanding;Returned demonstration             PT Long Term Goals - 06/02/16 1556      PT  LONG TERM GOAL #1   Title (TARGET DATE FOR ALL LTG 06/18/16) Pt will demonstrate independence with self-treatment and vestibular HEP   Baseline The patient was provided gaze x 1 viewing for home.  No further HEP indicated.   Time 4   Period Weeks   Status Achieved     PT LONG TERM GOAL #2   Title Pt will report 0/5 dizziness with looking up to put drops in eyes or reach for items on tall shelving   Baseline No dizziness.   Time 4   Period Weeks   Status Achieved     PT LONG TERM GOAL #3   Title Pt will demonstrate negative positional testing for all canals   Baseline No evidence of positional vertigo 06/02/16 per testing.   Time 4   Period Weeks   Status Achieved               Plan - 06/02/16 1734    Clinical Impression Statement The patient met 3/3 LTGs.  She notes no further episodes of BPPV and improved balance with resolution of vertigo.     Clinical Impairments Affecting Rehab Potential low back pain limits testing and treatment positions   PT Treatment/Interventions ADLs/Self Care Home Management;Canalith Repostioning;Functional mobility training;Therapeutic activities;Balance training;Neuromuscular re-education;Patient/family education;Vestibular   PT Next Visit Plan Discharge today   Consulted and Agree with Plan of Care Patient      Patient will benefit from skilled therapeutic intervention in order to improve the following deficits and impairments:  Decreased balance, Dizziness  Visit Diagnosis: BPPV (benign paroxysmal positional vertigo), right  Dizziness and giddiness  Unsteadiness on feet  PHYSICAL THERAPY DISCHARGE SUMMARY  Visits from Start of Care: 2  Current functional level related to goals / functional outcomes: See above   Remaining deficits: No remaining vertigo Continues with R SI pain being addressed by MD   Education / Equipment: HEP for gaze.  Plan: Patient agrees to discharge.  Patient goals were met. Patient is being discharged due  to meeting the stated rehab goals.  ?????        Thank you for the referral of this patient. Rudell Cobb, MPT      [place], PT 06/02/2016, 5:35 PM  [facility] [address] [place], [place], [address] Phone: [phone]   Fax:  [phone]  Name: April Donaldson MRN: [id number] Date of Birth: 12-31-1931

## 2016-06-06 ENCOUNTER — Encounter: Payer: Medicare Other | Admitting: Physical Therapy

## 2016-06-14 ENCOUNTER — Ambulatory Visit: Payer: Medicare Other | Admitting: Internal Medicine

## 2016-06-16 ENCOUNTER — Encounter: Payer: Medicare Other | Admitting: Physical Therapy

## 2016-06-19 ENCOUNTER — Ambulatory Visit (INDEPENDENT_AMBULATORY_CARE_PROVIDER_SITE_OTHER): Payer: Medicare Other | Admitting: Internal Medicine

## 2016-06-19 ENCOUNTER — Other Ambulatory Visit (INDEPENDENT_AMBULATORY_CARE_PROVIDER_SITE_OTHER): Payer: Medicare Other

## 2016-06-19 ENCOUNTER — Encounter: Payer: Self-pay | Admitting: Internal Medicine

## 2016-06-19 VITALS — BP 152/96 | HR 58 | Temp 97.6°F | Resp 12 | Ht 59.0 in | Wt 188.0 lb

## 2016-06-19 DIAGNOSIS — E785 Hyperlipidemia, unspecified: Secondary | ICD-10-CM

## 2016-06-19 DIAGNOSIS — M1A00X0 Idiopathic chronic gout, unspecified site, without tophus (tophi): Secondary | ICD-10-CM

## 2016-06-19 DIAGNOSIS — I10 Essential (primary) hypertension: Secondary | ICD-10-CM | POA: Diagnosis not present

## 2016-06-19 LAB — LIPID PANEL
CHOLESTEROL: 189 mg/dL (ref 0–200)
HDL: 55.9 mg/dL (ref >39.00)
LDL CALC: 100 mg/dL — AB (ref 0–99)
NonHDL: 132.63
TRIGLYCERIDES: 163 mg/dL — AB (ref 0.0–149.0)
Total CHOL/HDL Ratio: 3
VLDL: 32.6 mg/dL (ref 0.0–40.0)

## 2016-06-19 LAB — URIC ACID: URIC ACID, SERUM: 7.1 mg/dL — AB (ref 2.4–7.0)

## 2016-06-19 NOTE — Assessment & Plan Note (Signed)
Checking uric acid and adjust allopurinol 100 mg daily as needed. She is not sure she is taking this all the time.

## 2016-06-19 NOTE — Progress Notes (Signed)
Pre visit review using our clinic review tool, if applicable. No additional management support is needed unless otherwise documented below in the visit note. 

## 2016-06-19 NOTE — Assessment & Plan Note (Signed)
BP slightly above goal today but home readings are normal. She will continue to monitor. She is no longer having lightheadedness. Will continue atenolol 50 mg, hctz 25 mg daily, lisinopril 5 mg daily.

## 2016-06-19 NOTE — Assessment & Plan Note (Signed)
Checking lipid panel and adjust her simvastatin 20 mg daily as needed.

## 2016-06-19 NOTE — Progress Notes (Signed)
   Subjective:    Patient ID: April Donaldson, female    DOB: 01-Sep-1931, 83 y.o.   MRN: [id number]  HPI The patient is an 83 YO female coming in for follow up of her blood pressure (taking lisinopril 5 mg, hctz 25 mg, atenolol 50 mg daily, BP running well at home, no headaches, chest pains). Last visit she was having some dizziness with lisinopril 10 mg and we decreased back to 5 mg daily. Since that time her BP have been at goal at home and no more lightheadedness episodes. She is not exercising right now due to some vertigo about 1 month ago but would like to try resuming if she is able.   Review of Systems  Constitutional: Positive for activity change. Negative for appetite change, fatigue, fever and unexpected weight change.       Not exercising  HENT: Negative.   Respiratory: Negative for cough, chest tightness, shortness of breath and wheezing.   Cardiovascular: Negative.   Gastrointestinal: Negative.   Musculoskeletal: Negative.   Neurological: Negative.       Objective:   Physical Exam  Constitutional: She is oriented to person, place, and time. She appears well-developed and well-nourished.  HENT:  Head: Normocephalic and atraumatic.  Right Ear: External ear normal.  Eyes: EOM are normal.  Neck: Normal range of motion.  Cardiovascular: Normal rate and regular rhythm.   Pulmonary/Chest: Effort normal and breath sounds normal. No respiratory distress. She has no wheezes. She has no rales. She exhibits no tenderness.  Abdominal: Soft.  Neurological: She is alert and oriented to person, place, and time.  Skin: Skin is warm and dry.   Vitals:   06/19/16 1025 06/19/16 1041  BP: (!) 154/90 (!) 152/96  Pulse: (!) 58   Resp: 12   Temp: 97.6 F (36.4 C)   TempSrc: Oral   SpO2: 98%   Weight: 188 lb (85.3 kg)   Height: 4\' 11"  (1.499 m)       Assessment & Plan:

## 2016-06-19 NOTE — Patient Instructions (Signed)
We are checking the labs today and will call you back with the results.   We are not changing the medicines today.   Get back into the exercise as you are able.

## 2016-06-21 ENCOUNTER — Encounter: Payer: Medicare Other | Admitting: Physical Therapy

## 2016-06-22 ENCOUNTER — Emergency Department (HOSPITAL_COMMUNITY)
Admission: EM | Admit: 2016-06-22 | Discharge: 2016-06-22 | Disposition: A | Discharge status: Home / Self Care | Payer: Medicare Other | Attending: Emergency Medicine | Admitting: Emergency Medicine

## 2016-06-22 ENCOUNTER — Encounter (HOSPITAL_COMMUNITY): Payer: Self-pay

## 2016-06-22 DIAGNOSIS — Z7982 Long term (current) use of aspirin: Secondary | ICD-10-CM | POA: Diagnosis not present

## 2016-06-22 DIAGNOSIS — E039 Hypothyroidism, unspecified: Secondary | ICD-10-CM | POA: Insufficient documentation

## 2016-06-22 DIAGNOSIS — N183 Chronic kidney disease, stage 3 (moderate): Secondary | ICD-10-CM | POA: Diagnosis not present

## 2016-06-22 DIAGNOSIS — I10 Essential (primary) hypertension: Secondary | ICD-10-CM

## 2016-06-22 DIAGNOSIS — I129 Hypertensive chronic kidney disease with stage 1 through stage 4 chronic kidney disease, or unspecified chronic kidney disease: Secondary | ICD-10-CM | POA: Insufficient documentation

## 2016-06-22 DIAGNOSIS — Z79899 Other long term (current) drug therapy: Secondary | ICD-10-CM | POA: Insufficient documentation

## 2016-06-22 LAB — URINALYSIS, ROUTINE W REFLEX MICROSCOPIC
Bilirubin Urine: NEGATIVE
GLUCOSE, UA: NEGATIVE mg/dL
Hgb urine dipstick: NEGATIVE
KETONES UR: NEGATIVE mg/dL
LEUKOCYTES UA: NEGATIVE
NITRITE: NEGATIVE
Protein, ur: NEGATIVE mg/dL
Specific Gravity, Urine: 1.003 — ABNORMAL LOW (ref 1.005–1.030)
pH: 7 (ref 5.0–8.0)

## 2016-06-22 LAB — BASIC METABOLIC PANEL
ANION GAP: 9 (ref 5–15)
BUN: 18 mg/dL (ref 6–20)
CALCIUM: 9.6 mg/dL (ref 8.9–10.3)
CO2: 29 mmol/L (ref 22–32)
Chloride: 104 mmol/L (ref 101–111)
Creatinine, Ser: 1.12 mg/dL — ABNORMAL HIGH (ref 0.44–1.00)
GFR, EST AFRICAN AMERICAN: 52 mL/min — AB (ref >60)
GFR, EST NON AFRICAN AMERICAN: 44 mL/min — AB (ref >60)
Glucose, Bld: 95 mg/dL (ref 65–99)
Potassium: 4.2 mmol/L (ref 3.5–5.1)
SODIUM: 142 mmol/L (ref 135–145)

## 2016-06-22 LAB — CBC WITH DIFFERENTIAL/PLATELET
BASOS ABS: 0.1 10*3/uL (ref 0.0–0.1)
BASOS PCT: 1 %
Eosinophils Absolute: 0.1 10*3/uL (ref 0.0–0.7)
Eosinophils Relative: 1 %
HEMATOCRIT: 41.1 % (ref 36.0–46.0)
Hemoglobin: 13.8 g/dL (ref 12.0–15.0)
Lymphocytes Relative: 45 %
Lymphs Abs: 4.5 10*3/uL — ABNORMAL HIGH (ref 0.7–4.0)
MCH: 29.6 pg (ref 26.0–34.0)
MCHC: 33.6 g/dL (ref 30.0–36.0)
MCV: 88 fL (ref 78.0–100.0)
MONO ABS: 0.7 10*3/uL (ref 0.1–1.0)
Monocytes Relative: 7 %
NEUTROS ABS: 4.7 10*3/uL (ref 1.7–7.7)
NEUTROS PCT: 46 %
Platelets: 299 10*3/uL (ref 150–400)
RBC: 4.67 MIL/uL (ref 3.87–5.11)
RDW: 13.6 % (ref 11.5–15.5)
WBC: 10.1 10*3/uL (ref 4.0–10.5)

## 2016-06-22 NOTE — ED Notes (Signed)
Pt verbalized understanding discharge instructions and denies any further needs or questions at this time. VS stable, ambulatory and steady gait.   

## 2016-06-22 NOTE — ED Notes (Addendum)
Signature pad in room broken and not working at this time. Will try to find another.

## 2016-06-22 NOTE — Discharge Instructions (Signed)
Please read instructions below. Increase your daily dose of lisinopril to 10 mg per day and schedule an appointment as soon as possible with your primary care provider for follow-up on your blood pressure and medication change. Return to the ER for headache, vision changes, weakness, new or worsening symptoms.

## 2016-06-22 NOTE — ED Triage Notes (Signed)
Pt presents to the ed for having recent problems with her blood pressure, she is on medication but recently she cannot keep it under control.  States today her blood pressure has been high at home, reports a pressure headache this morning that has since subsided.  The patient denies any other symptoms.

## 2016-06-22 NOTE — ED Provider Notes (Signed)
[facility] DEPT Provider Note   CSN: [id number] Arrival date & time: 06/22/16  1428     History   Chief Complaint Chief Complaint  Patient presents with  . Hypertension    HPI April Donaldson is a 83 y.o. female.  Patient with past medical history of hypertension, CK D stage III, short of sleep apnea, gout presents with hypertension. Patient reports ECP visit on Monday with slightly elevated BP. Since then she has been checking her blood pressure every day and today noticed her blood pressure was elevated at 210/117. States she took her daily hypertension medications including Dyazide, atenolol, lisinopril, without blood pressure. States this morning she had "woozy" feeling in her head without vision changes. States those symptoms resolved prior to ED arrival. Denies chest pain, shortness of breath, abdominal pain, nausea, increased lower extremity edema, urinary symptoms, weakness. Reports she lives at home alone.      Past Medical History:  Diagnosis Date  . Anxiety   . Benign neoplasm of kidney, except pelvis   . Cellulitis of leg 03/09/2013 hosp   . Cerebrovascular disease   . Chronic low back pain   . DJD (degenerative joint disease)   . GERD (gastroesophageal reflux disease)    no meds  . Glaucoma   . Gout   . H/O hiatal hernia   . Hx of colonic polyps   . Hypercholesteremia   . Hypertension   . Hypothyroid   . IBS (irritable bowel syndrome)   . Obesity   . OSA (obstructive sleep apnea)    NPSG 2009: AHI 20/h. CPAP - follows with [facility]  . Renal insufficiency    one kidney small and non funtioning  . Tubular adenoma 2000  . Venous insufficiency     Patient Active Problem List   Diagnosis Date Noted  . Vertigo 04/19/2016  . Hair thinning 12/18/2014  . Routine general medical examination at a health care facility 06/15/2014  . Obstructive sleep apnea 11/11/2007  . GLAUCOMA 09/22/2007  . CKD (chronic kidney disease) stage 3, GFR 30-59 ml/min 03/21/2007    . Hypothyroidism 01/04/2007  . Hyperlipidemia 01/04/2007  . GOUT 01/04/2007  . OBESITY 01/04/2007  . Essential hypertension 01/04/2007    Past Surgical History:  Procedure Laterality Date  . APPENDECTOMY    . COLONOSCOPY    . DILATION AND CURETTAGE OF UTERUS    . FUNCTIONAL ENDOSCOPIC SINUS SURGERY  05/2003   Dr. Ernesto Rutherford  . hysterctomy and ooperectomy  1970's  . MASS EXCISION  2003   lt  . MASS EXCISION Left 09/17/2013   Procedure: EXCISION MASS LEFT HAND;  Surgeon: Schuyler Amor, MD;  Location: [facility];  Service: Orthopedics;  Laterality: Left;  . thyroid lobectomy for a cyst  10/2000   Dr. Ernesto Rutherford  . TONSILLECTOMY      OB History    No data available       Home Medications    Prior to Admission medications   Medication Sig Start Date End Date Taking? Authorizing Provider  allopurinol (ZYLOPRIM) 100 MG tablet TAKE 1 TABLET (100 MG TOTAL) BY MOUTH DAILY. Patient taking differently: Take 100 mg by mouth daily as needed (for gout flareup).  12/27/15   Hoyt Koch, MD  aspirin 81 MG tablet Take 81 mg by mouth daily.      Historical Provider, MD  atenolol (TENORMIN) 50 MG tablet Take 2 tablets (100 mg total) by mouth daily. Patient taking differently: Take 50 mg by mouth  daily.  08/09/15   Hoyt Koch, MD  Calcium Carbonate-Vitamin D (CALTRATE 600+D) 600-400 MG-UNIT per tablet Take 1 tablet by mouth 2 (two) times daily.      Historical Provider, MD  dorzolamide-timolol (COSOPT) 22.3-6.8 MG/ML ophthalmic solution Place 1 drop into both eyes 2 (two) times daily.    Historical Provider, MD  guaiFENesin (MUCINEX) 600 MG 12 hr tablet Take 600 mg by mouth 2 (two) times daily.    Historical Provider, MD  latanoprost (XALATAN) 0.005 % ophthalmic solution Place 1 drop into both eyes at bedtime.     Historical Provider, MD  levothyroxine (SYNTHROID, LEVOTHROID) 50 MCG tablet TAKE 1 TABLET (50 MCG TOTAL) BY MOUTH DAILY BEFORE BREAKFAST. 10/22/15    Hoyt Koch, MD  lisinopril (PRINIVIL,ZESTRIL) 5 MG tablet Take 5 mg by mouth daily. Will start this next month after stopping Lotrel. 06/08/14   Historical Provider, MD  Multiple Vitamins-Minerals (CENTRUM SILVER PO) Take 1 tablet by mouth every other day.     Historical Provider, MD  simvastatin (ZOCOR) 20 MG tablet TAKE 1 TABLET DAILY AT 6 P.M. 05/15/16   Hoyt Koch, MD  triamterene-hydrochlorothiazide (DYAZIDE) 37.5-25 MG capsule TAKE ONE CAPSULE BY MOUTH ONCE DAILY 02/25/16   Hoyt Koch, MD    Family History Family History  Problem Relation Age of Onset  . Heart disease Mother   . Asthma Brother   . Throat cancer Brother 41  . Asthma Sister   . Brain cancer Sister 89  . Breast cancer Sister 18    Social History Social History  Substance Use Topics  . Smoking status: Never Smoker  . Smokeless tobacco: Never Used  . Alcohol use No     Allergies   Amlodipine besylate; Clonidine; Diltiazem hcl; Klonopin [clonazepam]; Tetracycline; Doxycycline hyclate; Hydralazine; Iodine; Labetalol hcl; Lidocaine; Olmesartan medoxomil; and Penicillins   Review of Systems Review of Systems  Eyes: Negative for visual disturbance.  Respiratory: Negative for shortness of breath.   Cardiovascular: Negative for chest pain and leg swelling.  Gastrointestinal: Negative for abdominal pain and nausea.  Genitourinary: Negative for dysuria and frequency.  Neurological: Positive for light-headedness. Negative for weakness.     Physical Exam Updated Vital Signs BP (!) 177/86   Pulse (!) 55   Temp 97.9 F (36.6 C)   Resp 19   Ht 4\' 11"  (1.499 m)   Wt 85.3 kg   SpO2 98%   BMI 37.97 kg/m   Physical Exam  Constitutional: She is oriented to person, place, and time. She appears well-developed and well-nourished.  HENT:  Head: Normocephalic and atraumatic.  Eyes: Conjunctivae and EOM are normal.  Left pupil abnormal in shape; patient reports this is chronic x1 year  s/p cataract surgery. Pupils reactive to light  Neck: Normal range of motion.  Cardiovascular: Regular rhythm, normal heart sounds and intact distal pulses.  Exam reveals no gallop and no friction rub.   No murmur heard. Slightly bradycardic  Pulmonary/Chest: Effort normal. No respiratory distress. She has no wheezes. She has no rales.  Mildly decreased breath sounds bilaterally  Abdominal: Soft. Bowel sounds are normal. She exhibits no distension. There is no tenderness. There is no rebound.  Musculoskeletal: Normal range of motion.  Neurological: She is alert and oriented to person, place, and time. A cranial nerve deficit is present. No sensory deficit. She exhibits normal muscle tone.  Psychiatric: She has a normal mood and affect. Her behavior is normal.  Nursing note and vitals reviewed.  ED Treatments / Results  Labs (all labs ordered are listed, but only abnormal results are displayed) Labs Reviewed  URINALYSIS, ROUTINE W REFLEX MICROSCOPIC - Abnormal; Notable for the following:       Result Value   Color, Urine COLORLESS (*)    Specific Gravity, Urine 1.003 (*)    All other components within normal limits  CBC WITH DIFFERENTIAL/PLATELET - Abnormal; Notable for the following:    Lymphs Abs 4.5 (*)    All other components within normal limits  BASIC METABOLIC PANEL - Abnormal; Notable for the following:    Creatinine, Ser 1.12 (*)    GFR calc non Af Amer 44 (*)    GFR calc Af Amer 52 (*)    All other components within normal limits    EKG  EKG Interpretation  Date/Time:  Thursday June 22 2016 16:19:00 EDT Ventricular Rate:  55 PR Interval:    QRS Duration: 88 QT Interval:  437 QTC Calculation: 418 R Axis:   -24 Text Interpretation:  Sinus rhythm Borderline left axis deviation Low voltage, precordial leads Borderline T wave abnormalities No significant change since last tracing abn Confirmed by Carmin Muskrat  MD [phone]) on 06/22/2016 4:50:30 PM        Radiology No results found.  Procedures Procedures (including critical care time)  Medications Ordered in ED Medications - No data to display   Initial Impression / Assessment and Plan / ED Course  I have reviewed the triage vital signs and the nursing notes.  Pertinent labs & imaging results that were available during my care of the patient were reviewed by me and considered in my medical decision making (see chart for details).     Patient noted to be hypertensive in the emergency department. Patient well-appearing. No signs of hypertensive urgency.  CBC, CMP, UA, EKG reassuring and without acute changes. Patient's blood pressure decreased without intervention. No evidence of end organ damage. Will increase her lisinopril dose to 10 mg per day with the need for close follow-up and management by her primary care physician. Encouraged him to continue to check her blood pressure daily. Describes symptoms of low blood pressure and what to look out for. Patient verbalized understanding and agreed with plan.  Patient discussed with and seen by Dr. Vanita Panda.  Discussed results, findings, treatment and follow up. Patient advised of return precautions.    Final Clinical Impressions(s) / ED Diagnoses   Final diagnoses:  Essential hypertension    New Prescriptions New Prescriptions   No medications on file     Martinique N Russo, PA-C 06/22/16 [place], MD 06/29/16 [phone]

## 2016-06-22 NOTE — ED Notes (Signed)
Pt is A&Ox4 but denies, HA, N/V, dizziness, or blurry vision.  Pt reports having a fullness on her left parietal lobe/ left eye this morning when her her stated pressure 210/117.

## 2016-06-23 ENCOUNTER — Encounter: Payer: Self-pay | Admitting: Internal Medicine

## 2016-06-23 ENCOUNTER — Ambulatory Visit (INDEPENDENT_AMBULATORY_CARE_PROVIDER_SITE_OTHER): Payer: Medicare Other | Admitting: Internal Medicine

## 2016-06-23 DIAGNOSIS — N183 Chronic kidney disease, stage 3 unspecified: Secondary | ICD-10-CM

## 2016-06-23 DIAGNOSIS — I10 Essential (primary) hypertension: Secondary | ICD-10-CM | POA: Diagnosis not present

## 2016-06-23 NOTE — Progress Notes (Signed)
Pre visit review using our clinic review tool, if applicable. No additional management support is needed unless otherwise documented below in the visit note. 

## 2016-06-23 NOTE — Patient Instructions (Signed)
Keep taking the 1/2 lisinopril at night time for the next 2-3 nights to help get the pressure under control then try going back to just 1 pill daily.

## 2016-06-23 NOTE — Progress Notes (Signed)
   Subjective:    Patient ID: April Donaldson, female    DOB: 1931/03/21, 83 y.o.   MRN: [id number]  HPI The patient is an 83 YO female coming in for ER visit for high BP. She was evaluated with EKG and labs and no signs of hypertensive urgency/emergency. She does have longstanding hypertension which has been difficult to control due to multiple intolerances to medications. She has been fairly stable on current regimen which included lisinopril 5 mg, atenolol 50 mg, hctz 25 mg daily. She recently had been increased to 10 mg daily lisinopril and developed lightheadedness and other symptoms but was able to decrease to 5 mg daily again. The ER advised her to increase to 10 mg daily but she has not done that. She has taken an extra 2.5 mg lisinopril in the evening. She has been able to tolerate this for several days. BP at home 130s-150s/80s at home. No symptoms. She had BP 200/110 in the ER which decreased with observation. She was not feeling well at the time of the high BP and she has not felt that way since that time. Denies chest pains, SOB, abdominal pain, numbness, weakness, neurological changes. She is also following with nephrology due to her multiple intolerances.   PMH, [facility], social history reviewed and updated.   Review of Systems  Constitutional: Negative.   HENT: Negative.   Eyes: Negative.   Respiratory: Negative for cough, chest tightness and shortness of breath.   Cardiovascular: Negative for chest pain, palpitations and leg swelling.  Gastrointestinal: Negative for abdominal distention, abdominal pain, constipation, diarrhea, nausea and vomiting.  Musculoskeletal: Negative.   Skin: Negative.   Neurological: Negative.   Psychiatric/Behavioral: Negative.       Objective:   Physical Exam  Constitutional: She is oriented to person, place, and time. She appears well-developed and well-nourished.  HENT:  Head: Normocephalic and atraumatic.  Eyes: EOM are normal.  Neck: Normal range  of motion.  Cardiovascular: Normal rate and regular rhythm.   Pulmonary/Chest: Effort normal and breath sounds normal. No respiratory distress. She has no wheezes. She has no rales.  Abdominal: Soft. Bowel sounds are normal. She exhibits no distension. There is no tenderness. There is no rebound.  Musculoskeletal: She exhibits no edema.  Neurological: She is alert and oriented to person, place, and time. Coordination normal.  Skin: Skin is warm and dry.  Psychiatric: She has a normal mood and affect.   Vitals:   06/23/16 1359  BP: 130/80  Pulse: 63  Temp: 97.9 F (36.6 C)  TempSrc: Oral  SpO2: 98%  Weight: 187 lb 8 oz (85 kg)  Height: 4\' 11"  (1.499 m)      Assessment & Plan:

## 2016-06-25 ENCOUNTER — Encounter: Payer: Self-pay | Admitting: Internal Medicine

## 2016-06-25 NOTE — Assessment & Plan Note (Signed)
Has been difficult to control over the years with her multiple intolerances. She is encouraged to continue lisinopril 5 mg q am and 2.5 mg q pm as well as her atenolol and hctz. Last CMP without indication for change and too early to recheck today. BP is at goal and she is not having side effects.This is complicated by CKD stage 3 which is stable. She will continue to see nephrology regularly and she may need continued monitoring and adjustment if she does develop side effects with change in dosage.

## 2016-06-25 NOTE — Assessment & Plan Note (Signed)
Stable on recent labs from the ER and will not repeat today. Seeing nephrology in the next month and they typically monitor CMP as well.

## 2016-07-04 ENCOUNTER — Encounter: Payer: Self-pay | Admitting: Pulmonary Disease

## 2016-07-04 ENCOUNTER — Ambulatory Visit (INDEPENDENT_AMBULATORY_CARE_PROVIDER_SITE_OTHER): Payer: Medicare Other | Admitting: Pulmonary Disease

## 2016-07-04 VITALS — BP 124/88 | HR 51 | Ht 59.0 in | Wt 189.2 lb

## 2016-07-04 DIAGNOSIS — G4733 Obstructive sleep apnea (adult) (pediatric): Secondary | ICD-10-CM

## 2016-07-04 DIAGNOSIS — Z9989 Dependence on other enabling machines and devices: Secondary | ICD-10-CM

## 2016-07-04 NOTE — Patient Instructions (Signed)
Follow up in 1 year.
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
## 2016-07-16 ENCOUNTER — Other Ambulatory Visit: Payer: Self-pay | Admitting: Internal Medicine

## 2016-07-21 ENCOUNTER — Encounter: Payer: Self-pay | Admitting: Internal Medicine

## 2016-07-21 ENCOUNTER — Ambulatory Visit (INDEPENDENT_AMBULATORY_CARE_PROVIDER_SITE_OTHER): Payer: Medicare Other | Admitting: Internal Medicine

## 2016-07-21 DIAGNOSIS — B372 Candidiasis of skin and nail: Secondary | ICD-10-CM | POA: Insufficient documentation

## 2016-07-21 MED ORDER — NYSTATIN-TRIAMCINOLONE 100000-0.1 UNIT/GM-% EX OINT: 1.0000 "application " | TOPICAL_OINTMENT | Freq: Two times a day (BID) | CUTANEOUS | 6 refills | Status: DC

## 2016-07-21 NOTE — Progress Notes (Signed)
   Subjective:    Patient ID: April Donaldson, female    DOB: 03/01/1931, 83 y.o.   MRN: [id number]  HPI The patient is an 83 YO female coming in for rash under her breasts. Started about 1 week ago and overall is spreading and worsening. She is having itching in the area and denies pain except after scratching. She has not tried anything for it as she is not sure what to try. Has had this before but a long time ago and needed cream to solve it.   Review of Systems  Constitutional: Negative.   Respiratory: Negative.   Cardiovascular: Negative.   Gastrointestinal: Negative.   Musculoskeletal: Negative.   Skin: Positive for color change and rash. Negative for pallor and wound.  Neurological: Negative.       Objective:   Physical Exam  Constitutional: She is oriented to person, place, and time. She appears well-developed and well-nourished.  HENT:  Head: Normocephalic and atraumatic.  Eyes: EOM are normal.  Neck: Normal range of motion.  Cardiovascular: Normal rate and regular rhythm.   Pulmonary/Chest: Effort normal. No respiratory distress. She has no wheezes. She has no rales.  Abdominal: Soft.  Neurological: She is alert and oriented to person, place, and time.  Skin: Skin is warm and dry.  Red rash with satellite lesions more prominent under the left breast but also under the right breast.    Vitals:   07/21/16 1058  BP: (!) 144/82  Pulse: 92  Resp: 14  Temp: 97.8 F (36.6 C)  TempSrc: Oral  SpO2: 97%  Weight: 191 lb (86.6 kg)  Height: 4\' 11"  (1.499 m)      Assessment & Plan:

## 2016-07-21 NOTE — Patient Instructions (Signed)
We have sent in the cream to use on the rash that you use twice a day.   This should clear the rash in 1 week or less.   You can use cornstarch or baby powder to keep the area dry once healed to prevent it coming back.

## 2016-07-21 NOTE — Assessment & Plan Note (Signed)
Rx for nystatin triamcinolone cream for her breasts. Advised cornstarch to help with moisture accumulation after healing and avoid scratching.

## 2016-09-28 ENCOUNTER — Ambulatory Visit: Payer: Medicare Other | Admitting: Internal Medicine

## 2016-10-19 ENCOUNTER — Other Ambulatory Visit: Payer: Self-pay | Admitting: Internal Medicine

## 2016-11-07 ENCOUNTER — Ambulatory Visit: Payer: Medicare Other | Attending: Otolaryngology | Admitting: Physical Therapy

## 2016-11-07 DIAGNOSIS — H8111 Benign paroxysmal vertigo, right ear: Secondary | ICD-10-CM | POA: Diagnosis present

## 2016-11-07 NOTE — Patient Instructions (Addendum)
Sit to Side-Lying    Sit on edge of bed. 1. Turn head 45 to right. 2. Maintain head position and lie down slowly on left side. Hold until symptoms subside. 3. Sit up slowly. Hold until symptoms subside. 4. Turn head 45 to left. 5. Maintain head position and lie down slowly on right side. Hold until symptoms subside. 6. Sit up slowly. Repeat sequence __5__ times per session. Do _3-5___ sessions per day.  Copyright  VHI. All rights reserved.   Benign Positional Vertigo Vertigo is the feeling that you or your surroundings are moving when they are not. Benign positional vertigo is the most common form of vertigo. The cause of this condition is not serious (is benign). This condition is triggered by certain movements and positions (is positional). This condition can be dangerous if it occurs while you are doing something that could endanger you or others, such as driving. What are the causes? In many cases, the cause of this condition is not known. It may be caused by a disturbance in an area of the inner ear that helps your brain to sense movement and balance. This disturbance can be caused by a viral infection (labyrinthitis), head injury, or repetitive motion. What increases the risk? This condition is more likely to develop in:  Women.  People who are 18 years of age or older.  What are the signs or symptoms? Symptoms of this condition usually happen when you move your head or your eyes in different directions. Symptoms may start suddenly, and they usually last for less than a minute. Symptoms may include:  Loss of balance and falling.  Feeling like you are spinning or moving.  Feeling like your surroundings are spinning or moving.  Nausea and vomiting.  Blurred vision.  Dizziness.  Involuntary eye movement (nystagmus).  Symptoms can be mild and cause only slight annoyance, or they can be severe and interfere with daily life. Episodes of benign positional vertigo may return  (recur) over time, and they may be triggered by certain movements. Symptoms may improve over time. How is this diagnosed? This condition is usually diagnosed by medical history and a physical exam of the head, neck, and ears. You may be referred to a health care provider who specializes in ear, nose, and throat (ENT) problems (otolaryngologist) or a provider who specializes in disorders of the nervous system (neurologist). You may have additional testing, including:  MRI.  A CT scan.  Eye movement tests. Your health care provider may ask you to change positions quickly while he or she watches you for symptoms of benign positional vertigo, such as nystagmus. Eye movement may be tested with an electronystagmogram (ENG), caloric stimulation, the Dix-Hallpike test, or the roll test.  An electroencephalogram (EEG). This records electrical activity in your brain.  Hearing tests.  How is this treated? Usually, your health care provider will treat this by moving your head in specific positions to adjust your inner ear back to normal. Surgery may be needed in severe cases, but this is rare. In some cases, benign positional vertigo may resolve on its own in 2-4 weeks. Follow these instructions at home: Safety  Move slowly.Avoid sudden body or head movements.  Avoid driving.  Avoid operating heavy machinery.  Avoid doing any tasks that would be dangerous to you or others if a vertigo episode would occur.  If you have trouble walking or keeping your balance, try using a cane for stability. If you feel dizzy or unstable, sit down right  away.  Return to your normal activities as told by your health care provider. Ask your health care provider what activities are safe for you. General instructions  Take over-the-counter and prescription medicines only as told by your health care provider.  Avoid certain positions or movements as told by your health care provider.  Drink enough fluid to keep your  urine clear or pale yellow.  Keep all follow-up visits as told by your health care provider. This is important. Contact a health care provider if:  You have a fever.  Your condition gets worse or you develop new symptoms.  Your family or friends notice any behavioral changes.  Your nausea or vomiting gets worse.  You have numbness or a "pins and needles" sensation. Get help right away if:  You have difficulty speaking or moving.  You are always dizzy.  You faint.  You develop severe headaches.  You have weakness in your legs or arms.  You have changes in your hearing or vision.  You develop a stiff neck.  You develop sensitivity to light. This information is not intended to replace advice given to you by your health care provider. Make sure you discuss any questions you have with your health care provider. Document Released: 11/21/2005 Document Revised: 07/22/2015 Document Reviewed: 06/08/2014 Elsevier Interactive Patient Education  [employer].

## 2016-11-08 NOTE — Therapy (Signed)
[facility] [address] [place], [place], [address] Phone: [phone]   Fax:  [phone]  Physical Therapy Evaluation  Patient Details  Name: April Donaldson MRN: [id number] Date of Birth: Mar 18, 1931 Referring Provider: Minna Merritts, MD  Encounter Date: 11/07/2016      PT End of Session - 11/08/16 1437    Visit Number 1   Number of Visits 2   Date for PT Re-Evaluation 12/07/16   Authorization Type [facility]   Authorization Time Period 11-07-16 - 01-06-17   PT Start Time 1316   PT Stop Time 1401   PT Time Calculation (min) 45 min      Past Medical History:  Diagnosis Date  . Anxiety   . Benign neoplasm of kidney, except pelvis   . Cellulitis of leg 03/09/2013 hosp   . Cerebrovascular disease   . Chronic low back pain   . DJD (degenerative joint disease)   . GERD (gastroesophageal reflux disease)    no meds  . Glaucoma   . Gout   . H/O hiatal hernia   . Hx of colonic polyps   . Hypercholesteremia   . Hypertension   . Hypothyroid   . IBS (irritable bowel syndrome)   . Obesity   . OSA (obstructive sleep apnea)    NPSG 2009: AHI 20/h. CPAP - follows with [facility]  . Renal insufficiency    one kidney small and non funtioning  . Tubular adenoma 2000  . Venous insufficiency     Past Surgical History:  Procedure Laterality Date  . APPENDECTOMY    . COLONOSCOPY    . DILATION AND CURETTAGE OF UTERUS    . FUNCTIONAL ENDOSCOPIC SINUS SURGERY  05/2003   Dr. Ernesto Rutherford  . hysterctomy and ooperectomy  1970's  . MASS EXCISION  2003   lt  . MASS EXCISION Left 09/17/2013   Procedure: EXCISION MASS LEFT HAND;  Surgeon: Schuyler Amor, MD;  Location: [facility];  Service: Orthopedics;  Laterality: Left;  . thyroid lobectomy for a cyst  10/2000   Dr. Ernesto Rutherford  . TONSILLECTOMY      There were no vitals filed for this visit.       Subjective Assessment - 11/08/16 1427    Subjective Pt  reports she was here in March for BPPV; states she has had a little vertigo - 2 days ago and wants to get treatment to "avoid a big episode" if that is possible   Pertinent History BPPV initial episode in March 2018:   Patient Stated Goals resolve the vertigo and learn exercises to do should it re-occur            [facility] PT Assessment - 11/08/16 0001      Assessment   Medical Diagnosis Vertigo   Referring Provider Minna Merritts, MD   Onset Date/Surgical Date --  July 2018   Prior Therapy March - April 2018 at this facility (2 visits only)     Balance Screen   Has the patient fallen in the past 6 months No   Has the patient had a decrease in activity level because of a fear of falling?  No   Is the patient reluctant to leave their home because of a fear of falling?  No     Prior Function   Level of Independence Independent     Ambulation/Gait   Ambulation/Gait Yes   Ambulation/Gait Assistance 7: Independent   Ambulation Distance (Feet) 100 Feet  Assistive device None   Gait Pattern Within Functional Limits            Vestibular Assessment - 11/08/16 0001      Vestibular Assessment   General Observation Pt is an 83 yr old lady with mild c/o vertigo at this time; states she has had 1 episode when she turned over in bed (approx. 4 weeks ago) and she states she buried her face in the pillow in atttempt to prevent vertigo from occurring.  Pt received treatment at this facility in March 2018 for Rt BPPV - she states she wants to learn exercises to do to prevent a major epsiode from occurring.       Symptom Behavior   Type of Dizziness Spinning   Frequency of Dizziness varies depending on movement   Duration of Dizziness seconds   Aggravating Factors Rolling to right   Relieving Factors Lying supine;Closing eyes;Rest     Occulomotor Exam   Occulomotor Alignment Normal   Spontaneous Absent   Smooth Pursuits Intact   Saccades Intact     Positional Testing    Dix-Hallpike Dix-Hallpike Right;Dix-Hallpike Left     Dix-Hallpike Right   Dix-Hallpike Right Duration approx. 4 secs   Dix-Hallpike Right Symptoms Upbeat, right rotatory nystagmus     Dix-Hallpike Left   Dix-Hallpike Left Duration none   Dix-Hallpike Left Symptoms No nystagmus        Objective measurements completed on examination: See above findings.      Epley maneuver performed 2 reps for Rt BPPV; no nystagmus and no c/o vertigo noted on 2nd rep - indicating resolution of Rt BPPV            PT Education - 11/08/16 1436    Education provided Yes   Education Details BPPV info on etiology; Brandt-Daroff exercises prn for self treatment   Person(s) Educated Patient   Methods Explanation;Demonstration;Handout   Comprehension Verbalized understanding;Returned demonstration             PT Long Term Goals - 11/08/16 1446      PT LONG TERM GOAL #1   Title Independent in [place] for self tx prn for BPPV.   Time 4   Period Weeks   Status New   Target Date 12/07/16     PT LONG TERM GOAL #2   Title Pt will report no vertigo with bed mobility or with looking up.   Time 4   Period Weeks   Status New   Target Date 12/07/16     PT LONG TERM GOAL #3   Title Pt will have a (-) Rt Dix-Hallpike test to indicate resolution of Rt BPPV   Time 4   Period Weeks   Status New   Target Date 12/07/16                Plan - 11/08/16 1438    Clinical Impression Statement Pt is an 83 yr old lady who presents with mild c/o vertigo and has rotary upbeating nystagmus in Rt Dix-Hallpike test position, indicative of Rt BPPV. No c/o vertigo and no nystagmus noted on 2nd rep of Epley maneuver, appearing to have resolved with treatment.  PMH includes OSA, HTN, gout and CKD.       History and Personal Factors relevant to plan of care: Rt BPPV   Clinical Presentation Stable   Clinical Decision Making Low   Rehab Potential Good   PT Frequency 1x / week   PT  Duration 2 weeks  PT Next Visit Plan recheck Rt BPPV   PT Home Exercise Plan Brandt-Daroff exs prn   Consulted and Agree with Plan of Care Patient      Patient will benefit from skilled therapeutic intervention in order to improve the following deficits and impairments:  Dizziness  Visit Diagnosis: BPPV (benign paroxysmal positional vertigo), right - Plan: PT plan of care cert/re-cert      G-Codes - 06/00/45 1450    Functional Assessment Tool Used (Outpatient Only) (+) Rt Dix-Hallpike test indicative of Rt BPPV   Functional Limitation Changing and maintaining body position   Changing and Maintaining Body Position Current Status (T9774) At least 40 percent but less than 60 percent impaired, limited or restricted   Changing and Maintaining Body Position Goal Status (F4239) 0 percent impaired, limited or restricted       Problem List Patient Active Problem List   Diagnosis Date Noted  . Candidal skin infection 07/21/2016  . Vertigo 04/19/2016  . Hair thinning 12/18/2014  . Routine general medical examination at a health care facility 06/15/2014  . Obstructive sleep apnea 11/11/2007  . GLAUCOMA 09/22/2007  . CKD (chronic kidney disease) stage 3, GFR 30-59 ml/min 03/21/2007  . Hypothyroidism 01/04/2007  . Hyperlipidemia 01/04/2007  . GOUT 01/04/2007  . OBESITY 01/04/2007  . Essential hypertension 01/04/2007    Alda Lea, PT 11/08/2016, 2:53 PM  [facility] [address] [place], [place], [address] Phone: [phone]   Fax:  [phone]  Name: April Donaldson MRN: [id number] Date of Birth: [birth year]

## 2016-11-21 ENCOUNTER — Other Ambulatory Visit (INDEPENDENT_AMBULATORY_CARE_PROVIDER_SITE_OTHER): Payer: Medicare Other

## 2016-11-21 ENCOUNTER — Ambulatory Visit: Payer: Medicare Other | Admitting: Physical Therapy

## 2016-11-21 ENCOUNTER — Encounter: Payer: Self-pay | Admitting: Internal Medicine

## 2016-11-21 ENCOUNTER — Ambulatory Visit (INDEPENDENT_AMBULATORY_CARE_PROVIDER_SITE_OTHER): Payer: Medicare Other | Admitting: Internal Medicine

## 2016-11-21 VITALS — BP 128/80 | HR 50 | Temp 97.8°F | Ht 59.0 in | Wt 186.0 lb

## 2016-11-21 DIAGNOSIS — E039 Hypothyroidism, unspecified: Secondary | ICD-10-CM

## 2016-11-21 DIAGNOSIS — N183 Chronic kidney disease, stage 3 unspecified: Secondary | ICD-10-CM

## 2016-11-21 DIAGNOSIS — I10 Essential (primary) hypertension: Secondary | ICD-10-CM

## 2016-11-21 DIAGNOSIS — H8111 Benign paroxysmal vertigo, right ear: Secondary | ICD-10-CM

## 2016-11-21 LAB — COMPREHENSIVE METABOLIC PANEL
ALK PHOS: 50 U/L (ref 39–117)
ALT: 18 U/L (ref 0–35)
AST: 20 U/L (ref 0–37)
Albumin: 4.2 g/dL (ref 3.5–5.2)
BUN: 18 mg/dL (ref 6–23)
CO2: 33 meq/L — AB (ref 19–32)
Calcium: 9.8 mg/dL (ref 8.4–10.5)
Chloride: 104 mEq/L (ref 96–112)
Creatinine, Ser: 1.19 mg/dL (ref 0.40–1.20)
GFR: 55.73 mL/min — ABNORMAL LOW (ref >60.00)
Glucose, Bld: 67 mg/dL — ABNORMAL LOW (ref 70–99)
POTASSIUM: 3.6 meq/L (ref 3.5–5.1)
SODIUM: 142 meq/L (ref 135–145)
TOTAL PROTEIN: 7.7 g/dL (ref 6.0–8.3)
Total Bilirubin: 0.5 mg/dL (ref 0.2–1.2)

## 2016-11-21 LAB — TSH: TSH: 3.64 u[IU]/mL (ref 0.35–4.50)

## 2016-11-21 NOTE — Patient Instructions (Addendum)
We will check the labs today and call you back about the results.    

## 2016-11-21 NOTE — Progress Notes (Signed)
   Subjective:    Patient ID: April Donaldson, female    DOB: 13-May-1931, 83 y.o.   MRN: [id number]  HPI The patient is an 83 YO female coming in for follow up of her blood pressure (controlled on her atenolol, lisinopril, hctz, no side effects, denies chest pains or headaches) and her thyroid (has felt more tired lately, some gain of weight, wonders if her thyroid levels are changed, taking her synthroid 50 mcg daily), and her weight (still working on exercise and diet but not doing well with exercise lately).   Review of Systems  Constitutional: Positive for fatigue and unexpected weight change. Negative for activity change, appetite change and chills.  HENT: Negative.   Eyes: Negative.   Respiratory: Negative for cough, chest tightness and shortness of breath.   Cardiovascular: Negative for chest pain, palpitations and leg swelling.  Gastrointestinal: Negative for abdominal distention, abdominal pain, constipation, diarrhea, nausea and vomiting.  Musculoskeletal: Negative.   Skin: Negative.   Neurological: Negative.   Psychiatric/Behavioral: Negative.       Objective:   Physical Exam  Constitutional: She is oriented to person, place, and time. She appears well-developed and well-nourished.  Overweight  HENT:  Head: Normocephalic and atraumatic.  Eyes: EOM are normal.  Neck: Normal range of motion.  Cardiovascular: Normal rate and regular rhythm.   Pulmonary/Chest: Effort normal and breath sounds normal. No respiratory distress. She has no wheezes. She has no rales.  Abdominal: Soft. Bowel sounds are normal. She exhibits no distension. There is no tenderness. There is no rebound.  Musculoskeletal: She exhibits no edema.  Neurological: She is alert and oriented to person, place, and time. Coordination normal.  Skin: Skin is warm and dry.  Psychiatric: She has a normal mood and affect.   Vitals:   11/21/16 1449  BP: 128/80  Pulse: (!) 50  Temp: 97.8 F (36.6 C)  TempSrc: Oral   SpO2: 97%  Weight: 186 lb (84.4 kg)  Height: 4\' 11"  (1.499 m)      Assessment & Plan:  Flu shot given at visit

## 2016-11-22 ENCOUNTER — Encounter: Payer: Self-pay | Admitting: Internal Medicine

## 2016-11-22 NOTE — Therapy (Signed)
[facility] [address] [place], [place], [address] Phone: [phone]   Fax:  [phone]  Physical Therapy Treatment  Patient Details  Name: April Donaldson MRN: [id number] Date of Birth: 1932/02/23 Referring Provider: Minna Merritts, MD  Encounter Date: 11/21/2016      PT End of Session - 11/22/16 1446    Visit Number 2   Date for PT Re-Evaluation 12/07/16   Authorization Type [facility] [facility]   Authorization Time Period 11-07-16 - 01-06-17   PT Start Time 0850   PT Stop Time 0917  session ended early due to no vertigo   PT Time Calculation (min) 27 min      Past Medical History:  Diagnosis Date  . Anxiety   . Benign neoplasm of kidney, except pelvis   . Cellulitis of leg 03/09/2013 hosp   . Cerebrovascular disease   . Chronic low back pain   . DJD (degenerative joint disease)   . GERD (gastroesophageal reflux disease)    no meds  . Glaucoma   . Gout   . H/O hiatal hernia   . Hx of colonic polyps   . Hypercholesteremia   . Hypertension   . Hypothyroid   . IBS (irritable bowel syndrome)   . Obesity   . OSA (obstructive sleep apnea)    NPSG 2009: AHI 20/h. CPAP - follows with [facility]  . Renal insufficiency    one kidney small and non funtioning  . Tubular adenoma 2000  . Venous insufficiency     Past Surgical History:  Procedure Laterality Date  . APPENDECTOMY    . COLONOSCOPY    . DILATION AND CURETTAGE OF UTERUS    . FUNCTIONAL ENDOSCOPIC SINUS SURGERY  05/2003   Dr. Ernesto Rutherford  . hysterctomy and ooperectomy  1970's  . MASS EXCISION  2003   lt  . MASS EXCISION Left 09/17/2013   Procedure: EXCISION MASS LEFT HAND;  Surgeon: Schuyler Amor, MD;  Location: [facility];  Service: Orthopedics;  Laterality: Left;  . thyroid lobectomy for a cyst  10/2000   Dr. Ernesto Rutherford  . TONSILLECTOMY      There were no vitals filed for this visit.      Subjective Assessment - 11/22/16 1445    Subjective Pt reports she has not had any vertigo since treatment in PT 2 weeks ago   Pertinent History BPPV initial episode in March 2018:   Patient Stated Goals resolve the vertigo and learn exercises to do should it re-occur   Currently in Pain? No/denies           (-) Rt Dix-Hallpike test with no nystagmus and no c/o vertigo Pt able to roll supine to Rt side and to Lt side with no c/o vertigo and no nystagmus  Reviewed LTG's and progress - pt has met all LTG's - ready for D/C                           PT Long Term Goals - 11/22/16 1447      PT LONG TERM GOAL #1   Title Independent in Brandt-Daroff exs for self tx prn for BPPV.   Baseline achieved 11-21-16   Status Achieved     PT LONG TERM GOAL #2   Title Pt will report no vertigo with bed mobility or with looking up.   Baseline No dizziness reported or provoked on 11-21-16   Status Achieved  PT LONG TERM GOAL #3   Title Pt will have a (-) Rt Dix-Hallpike test to indicate resolution of Rt BPPV   Baseline met 12-19-16   Status Achieved               Plan - 11/22/16 1448    Clinical Impression Statement Pt has met all LTG's - no vertigo reported and none provoked with any positional testing on 12/19/16; will D/C due to vertigo resolved   Rehab Potential Good   PT Frequency 1x / week   PT Duration 2 weeks   PT Next Visit Plan N/A - D/C   PT Home Exercise Plan Brandt-Daroff exs prn   Consulted and Agree with Plan of Care Patient      Patient will benefit from skilled therapeutic intervention in order to improve the following deficits and impairments:  Dizziness  Visit Diagnosis: BPPV (benign paroxysmal positional vertigo), right       G-Codes - 2016-12-19 1450    Functional Assessment Tool Used (Outpatient Only) no c/o vertigo at this time   Functional Limitation Changing and maintaining body position   Changing and Maintaining Body Position Goal Status (Z6109) 0 percent impaired,  limited or restricted   Changing and Maintaining Body Position Discharge Status (U0454) 0 percent impaired, limited or restricted      Problem List Patient Active Problem List   Diagnosis Date Noted  . Candidal skin infection 07/21/2016  . Vertigo 04/19/2016  . Hair thinning 12/18/2014  . Routine general medical examination at a health care facility 06/15/2014  . Obstructive sleep apnea 11/11/2007  . GLAUCOMA 09/22/2007  . CKD (chronic kidney disease) stage 3, GFR 30-59 ml/min 03/21/2007  . Hypothyroidism 01/04/2007  . Hyperlipidemia 01/04/2007  . GOUT 01/04/2007  . OBESITY 01/04/2007  . Essential hypertension 01/04/2007   PHYSICAL THERAPY DISCHARGE SUMMARY  Visits from Start of Care: 2  Current functional level related to goals / functional outcomes: Pt has met all LTG's - Rt BPPV has resolved as of this time   Remaining deficits: None regarding vertigo   Education / Equipment: Pt has been instructed in Brandt-Daroff exercises for self treatment prn Plan: Patient agrees to discharge.  Patient goals were met. Patient is being discharged due to meeting the stated rehab goals.  ?????       Alda Lea, PT 11/22/2016, 2:56 PM  [facility] [address] [place], [place], [address] Phone: [phone]   Fax:  [phone]  Name: April Donaldson MRN: [id number] Date of Birth: 11-Feb-1932

## 2016-11-24 NOTE — Assessment & Plan Note (Signed)
Checking TSH and adjust synthroid 50 mcg daily as needed. She is having some symptoms which could be related to change in thyroid levels.

## 2016-11-24 NOTE — Assessment & Plan Note (Signed)
Taking lisinopril/hctz, atenolol without side effects. Needs CMP and adjust as needed. Complicated by CKD stage 3.

## 2016-11-24 NOTE — Assessment & Plan Note (Signed)
BMI 37 but complicated by essential hypertension, OSA, hyperlipidemia. She is working on weight loss but is currently unsuccessful. She is not interested in pursuing more aggressive weight loss methods at this time.

## 2016-11-24 NOTE — Assessment & Plan Note (Signed)
Checking CMP for stability. BP at goal and no diabetes. Reminded her of the importance of maintaining her medications.

## 2017-01-13 ENCOUNTER — Other Ambulatory Visit: Payer: Self-pay | Admitting: Internal Medicine

## 2017-02-07 ENCOUNTER — Other Ambulatory Visit: Payer: Self-pay | Admitting: Internal Medicine

## 2017-02-14 ENCOUNTER — Other Ambulatory Visit: Payer: Self-pay | Admitting: Internal Medicine

## 2017-02-15 ENCOUNTER — Ambulatory Visit (INDEPENDENT_AMBULATORY_CARE_PROVIDER_SITE_OTHER): Payer: Medicare Other | Admitting: *Deleted

## 2017-02-15 VITALS — BP 142/88 | HR 77 | Resp 18 | Ht 59.0 in | Wt 188.0 lb

## 2017-02-15 DIAGNOSIS — Z0000 Encounter for general adult medical examination without abnormal findings: Secondary | ICD-10-CM

## 2017-02-15 NOTE — Progress Notes (Signed)
Subjective:   April Donaldson is a 83 y.o. female who presents for Medicare Annual (Subsequent) preventive examination.  Review of Systems:  No ROS.  Medicare Wellness Visit. Additional risk factors are reflected in the social history.  Cardiac Risk Factors include: advanced age (>66men, >32 women);hypertension Sleep patterns: feels rested on waking, gets up 1 times nightly to void and sleeps 7-8 hours nightly. Wears C-PAP Home Safety/Smoke Alarms: Feels safe in home. Smoke alarms in place.  Living environment; residence and Firearm Safety: 1-story house/ trailer, no firearms. Lives alone, no needs for DME, good support system Seat Belt Safety/Bike Helmet: Wears seat belt.     Objective:     Vitals: BP (!) 142/88   Pulse 77   Resp 18   Ht 4\' 11"  (1.499 m)   Wt 188 lb (85.3 kg)   SpO2 98%   BMI 37.97 kg/m   Body mass index is 37.97 kg/m.  Advanced Directives 02/15/2017 06/22/2016 05/19/2016 04/04/2016 05/07/2015 09/15/2013 03/10/2013  Does Patient Have a Medical Advance Directive? Yes No Yes No Yes Patient has advance directive, copy not in chart Patient has advance directive, copy not in chart  Type of Advance Directive [place];Living will - [place];Living will [place];Living will  Does patient want to make changes to medical advance directive? - - No - Patient declined - - No change requested No  Copy of Healthcare Power of Attorney in Chart? No - copy requested - - - Yes - Copy requested from other (Comment)  Pre-existing out of facility DNR order (yellow form or pink MOST form) - - - - - - No    Tobacco Social History   Tobacco Use  Smoking Status Never Smoker  Smokeless Tobacco Never Used     Counseling given: Not Answered  Past Medical History:  Diagnosis Date  . Anxiety   . Benign neoplasm of kidney, except pelvis   . Cellulitis of leg 03/09/2013 hosp   . Cerebrovascular  disease   . Chronic low back pain   . DJD (degenerative joint disease)   . GERD (gastroesophageal reflux disease)    no meds  . Glaucoma   . Gout   . H/O hiatal hernia   . Hx of colonic polyps   . Hypercholesteremia   . Hypertension   . Hypothyroid   . IBS (irritable bowel syndrome)   . Obesity   . OSA (obstructive sleep apnea)    NPSG 2009: AHI 20/h. CPAP - follows with [facility]  . Renal insufficiency    one kidney small and non funtioning  . Tubular adenoma 2000  . Venous insufficiency    Past Surgical History:  Procedure Laterality Date  . APPENDECTOMY    . cataracts Bilateral   . COLONOSCOPY    . DILATION AND CURETTAGE OF UTERUS    . FUNCTIONAL ENDOSCOPIC SINUS SURGERY  05/2003   Dr. Ernesto Rutherford  . hysterctomy and ooperectomy  1970's  . MASS EXCISION  2003   lt  . MASS EXCISION Left 09/17/2013   Procedure: EXCISION MASS LEFT HAND;  Surgeon: Schuyler Amor, MD;  Location: [facility];  Service: Orthopedics;  Laterality: Left;  . thyroid lobectomy for a cyst  10/2000   Dr. Ernesto Rutherford  . TONSILLECTOMY     Family History  Problem Relation Age of Onset  . Heart disease Mother   . Asthma Brother   . Throat cancer  Brother 57  . Asthma Sister   . Brain cancer Sister 71  . Breast cancer Sister 74   Social History   Socioeconomic History  . Marital status: Single    Spouse name: None  . Number of children: 1  . Years of education: None  . Highest education level: None  Social Needs  . Financial resource strain: Not very hard  . Food insecurity - worry: Never true  . Food insecurity - inability: Never true  . Transportation needs - medical: No  . Transportation needs - non-medical: No  Occupational History  . Occupation: retired from worked in Engineer, manufacturing systems: RETIRED  Tobacco Use  . Smoking status: Never Smoker  . Smokeless tobacco: Never Used  Substance and Sexual Activity  . Alcohol use: No    Alcohol/week: 0.0 oz  . Drug use: No  .  Sexual activity: No  Other Topics Concern  . None  Social History Narrative   Lives alone, indep - never married   g-dtr in [place], siblings in town    Outpatient Encounter Medications as of 02/15/2017  Medication Sig  . allopurinol (ZYLOPRIM) 100 MG tablet TAKE 1 TABLET (100 MG TOTAL) BY MOUTH DAILY. (Patient taking differently: Take 100 mg by mouth daily as needed (for gout flareup). )  . aspirin 81 MG tablet Take 81 mg by mouth daily.    Marland Kitchen atenolol (TENORMIN) 50 MG tablet TAKE 2 TABLETS DAILY  . Calcium Carbonate-Vitamin D (CALTRATE 600+D) 600-400 MG-UNIT per tablet Take 1 tablet by mouth 2 (two) times daily.    . dorzolamide-timolol (COSOPT) 22.3-6.8 MG/ML ophthalmic solution Place 1 drop into both eyes 2 (two) times daily.  Marland Kitchen latanoprost (XALATAN) 0.005 % ophthalmic solution Place 1 drop into both eyes at bedtime.   Marland Kitchen levothyroxine (SYNTHROID, LEVOTHROID) 50 MCG tablet TAKE 1 TABLET BY MOUTH EVERY DAY BEFORE BREAKFAST, NEEDS OFFICE VISIT FOR REFILLS  . lisinopril (PRINIVIL,ZESTRIL) 5 MG tablet Take 5 mg by mouth daily. Will start this next month after stopping Lotrel.  . Multiple Vitamins-Minerals (CENTRUM SILVER PO) Take 1 tablet by mouth every other day.   . nystatin-triamcinolone ointment (MYCOLOG) Apply 1 application topically 2 (two) times daily. (Patient taking differently: Apply 1 application topically as needed. )  . simvastatin (ZOCOR) 20 MG tablet TAKE 1 TABLET DAILY AT 6 P.M.  . triamterene-hydrochlorothiazide (DYAZIDE) 37.5-25 MG capsule TAKE ONE CAPSULE BY MOUTH ONCE DAILY   No facility-administered encounter medications on file as of 02/15/2017.     Activities of Daily Living In your present state of health, do you have any difficulty performing the following activities: 02/15/2017  Hearing? N  Vision? N  Difficulty concentrating or making decisions? N  Walking or climbing stairs? N  Dressing or bathing? N  Doing errands, shopping? N  Preparing Food and eating  ? N  Using the Toilet? N  In the past six months, have you accidently leaked urine? N  Do you have problems with loss of bowel control? N  Managing your Medications? N  Managing your Finances? N  Housekeeping or managing your Housekeeping? N  Some recent data might be hidden    Patient Care Team: Hoyt Koch, MD as PCP - General (Internal Medicine) [facility], Armando Reichert, MD (Sleep Medicine) Josue Hector, MD (Cardiology) Gatha Mayer, MD (Gastroenterology) Edrick Oh, MD (Nephrology) Raynelle Bring, MD (Urology) Thornell Sartorius, MD (Otolaryngology) Rutherford Guys, MD (Ophthalmology) Avon Gully, NP (Obstetrics and Gynecology) Edilia Bo Tracie Harrier,  MD (Ophthalmology) Charlotte Crumb, MD (Orthopedic Surgery)    Assessment:   This is a routine wellness examination for [facility]. Physical assessment deferred to PCP.  Exercise Activities and Dietary recommendations Current Exercise Habits: Home exercise routine, Type of exercise: stretching, Time (Minutes): 40, Intensity: Mild, Exercise limited by: orthopedic condition(s) Diet (meal preparation, eat out, water intake, caffeinated beverages, dairy products, fruits and vegetables): in general, a "healthy" diet  , well balanced  Reviewed heart healthy diet, discussed weight loss tips, Diet education was attached to patient's AVS and was provided via handout.  Goals      Patient Stated   . patient (pt-stated)     To keep working on staying on healthy; by learning about good food choices and exercise that works for you.      Other   . Patient Stated     Lose weight,  monitor diet closer and continue to exercise. Enjoy life and family.       Fall Risk Fall Risk  02/15/2017 11/21/2016 05/07/2015 04/30/2014 09/18/2012  Falls in the past year? No No No No No    Depression Screen PHQ 2/9 Scores 02/15/2017 11/21/2016 05/07/2015 04/30/2014  PHQ - 2 Score 0 0 0 0  PHQ- 9 Score 0 - - -     Cognitive Function MMSE - Mini  Mental State Exam 02/15/2017 05/07/2015  Not completed: - (No Data)  Orientation to time 5 -  Orientation to Place 5 -  Registration 3 -  Attention/ Calculation 5 -  Recall 2 -  Language- name 2 objects 2 -  Language- repeat 1 -  Language- follow 3 step command 3 -  Language- read & follow direction 1 -  Write a sentence 1 -  Copy design 1 -  Total score 29 -        Immunization History  Administered Date(s) Administered  . Influenza Split 11/30/2010, 11/20/2011  . Influenza Whole 12/12/2006, 11/27/2007, 11/25/2008, 11/26/2009  . Influenza, High Dose Seasonal PF 11/23/2015  . Influenza,inj,Quad PF,6+ Mos 11/26/2012, 11/06/2013, 11/19/2014  . Pneumococcal Conjugate-13 12/15/2014  . Pneumococcal Polysaccharide-23 03/16/2009  . Td 02/27/2001  . Tdap 03/21/2012   Screening Tests Health Maintenance  Topic Date Due  . INFLUENZA VACCINE  05/28/2017 (Originally 09/27/2016)  . TETANUS/TDAP  03/21/2022  . DEXA SCAN  Completed  . PNA vac Low Risk Adult  Completed      Plan:     Continue doing brain stimulating activities (puzzles, reading, adult coloring books, staying active) to keep memory sharp.   Continue to eat heart healthy diet (full of fruits, vegetables, whole grains, lean protein, water--limit salt, fat, and sugar intake) and increase physical activity as tolerated.  I have personally reviewed and noted the following in the patient's chart:   . Medical and social history . Use of alcohol, tobacco or illicit drugs  . Current medications and supplements . Functional ability and status . Nutritional status . Physical activity . Advanced directives . List of other physicians . Vitals . Screenings to include cognitive, depression, and falls . Referrals and appointments  In addition, I have reviewed and discussed with patient certain preventive protocols, quality metrics, and best practice recommendations. A written personalized care plan for preventive services as  well as general preventive health recommendations were provided to patient.     Michiel Cowboy, RN  02/15/2017

## 2017-02-15 NOTE — Patient Instructions (Signed)
Continue doing brain stimulating activities (puzzles, reading, adult coloring books, staying active) to keep memory sharp.   Continue to eat heart healthy diet (full of fruits, vegetables, whole grains, lean protein, water--limit salt, fat, and sugar intake) and increase physical activity as tolerated.   [name] , Thank you for taking time to come for your Medicare Wellness Visit. I appreciate your ongoing commitment to your health goals. Please review the following plan we discussed and let me know if I can assist you in the future.   These are the goals we discussed: Goals      Patient Stated   . patient (pt-stated)     To keep working on staying on healthy; by learning about good food choices and exercise that works for you.      Other   . Patient Stated     Lose weight, try monitor diet closer and continue to exercise. Enjoy life and family.       This is a list of the screening recommended for you and due dates:  Health Maintenance  Topic Date Due  . Flu Shot  05/28/2017*  . Tetanus Vaccine  03/21/2022  . DEXA scan (bone density measurement)  Completed  . Pneumonia vaccines  Completed  *Topic was postponed. The date shown is not the original due date.    Carbohydrate Counting for Diabetes Mellitus, Adult Carbohydrate counting is a method for keeping track of how many carbohydrates you eat. Eating carbohydrates naturally increases the amount of sugar (glucose) in the blood. Counting how many carbohydrates you eat helps keep your blood glucose within normal limits, which helps you manage your diabetes (diabetes mellitus). It is important to know how many carbohydrates you can safely have in each meal. This is different for every person. A diet and nutrition specialist (registered dietitian) can help you make a meal plan and calculate how many carbohydrates you should have at each meal and snack. Carbohydrates are found in the following foods:  Grains, such as breads and  cereals.  Dried beans and soy products.  Starchy vegetables, such as potatoes, peas, and corn.  Fruit and fruit juices.  Milk and yogurt.  Sweets and snack foods, such as cake, cookies, candy, chips, and soft drinks.  How do I count carbohydrates? There are two ways to count carbohydrates in food. You can use either of the methods or a combination of both. Reading "Nutrition Facts" on packaged food The "Nutrition Facts" list is included on the labels of almost all packaged foods and beverages in the [place] It includes:  The serving size.  Information about nutrients in each serving, including the grams (g) of carbohydrate per serving.  To use the "Nutrition Facts":  Decide how many servings you will have.  Multiply the number of servings by the number of carbohydrates per serving.  The resulting number is the total amount of carbohydrates that you will be having.  Learning standard serving sizes of other foods When you eat foods containing carbohydrates that are not packaged or do not include "Nutrition Facts" on the label, you need to measure the servings in order to count the amount of carbohydrates:  Measure the foods that you will eat with a food scale or measuring cup, if needed.  Decide how many standard-size servings you will eat.  Multiply the number of servings by 15. Most carbohydrate-rich foods have about 15 g of carbohydrates per serving. ? For example, if you eat 8 oz (170 g) of strawberries, you  will have eaten 2 servings and 30 g of carbohydrates (2 servings x 15 g = 30 g).  For foods that have more than one food mixed, such as soups and casseroles, you must count the carbohydrates in each food that is included.  The following list contains standard serving sizes of common carbohydrate-rich foods. Each of these servings has about 15 g of carbohydrates:   hamburger bun or  English muffin.   oz (15 mL) syrup.   oz (14 g) jelly.  1 slice of bread.  1  six-inch tortilla.  3 oz (85 g) cooked rice or pasta.  4 oz (113 g) cooked dried beans.  4 oz (113 g) starchy vegetable, such as peas, corn, or potatoes.  4 oz (113 g) hot cereal.  4 oz (113 g) mashed potatoes or  of a large baked potato.  4 oz (113 g) canned or frozen fruit.  4 oz (120 mL) fruit juice.  4-6 crackers.  6 chicken nuggets.  6 oz (170 g) unsweetened dry cereal.  6 oz (170 g) plain fat-free yogurt or yogurt sweetened with artificial sweeteners.  8 oz (240 mL) milk.  8 oz (170 g) fresh fruit or one small piece of fruit.  24 oz (680 g) popped popcorn.  Example of carbohydrate counting Sample meal  3 oz (85 g) chicken breast.  6 oz (170 g) brown rice.  4 oz (113 g) corn.  8 oz (240 mL) milk.  8 oz (170 g) strawberries with sugar-free whipped topping. Carbohydrate calculation 1. Identify the foods that contain carbohydrates: ? Rice. ? Corn. ? Milk. ? Strawberries. 2. Calculate how many servings you have of each food: ? 2 servings rice. ? 1 serving corn. ? 1 serving milk. ? 1 serving strawberries. 3. Multiply each number of servings by 15 g: ? 2 servings rice x 15 g = 30 g. ? 1 serving corn x 15 g = 15 g. ? 1 serving milk x 15 g = 15 g. ? 1 serving strawberries x 15 g = 15 g. 4. Add together all of the amounts to find the total grams of carbohydrates eaten: ? 30 g + 15 g + 15 g + 15 g = 75 g of carbohydrates total. This information is not intended to replace advice given to you by your health care provider. Make sure you discuss any questions you have with your health care provider. Document Released: 02/13/2005 Document Revised: 09/03/2015 Document Reviewed: 07/28/2015 Elsevier Interactive Patient Education  2018 [employer] Content in Foods Generally, most healthy people need around 50 grams of protein each day. Depending on your overall health, you may need more or less protein in your diet. Talk to your health care provider or  dietitian about how much protein you need. See the following list for the protein content of some common foods. High-protein foods High-protein foods contain 4 grams (4 g) or more of protein per serving. They include:  Beef, ground sirloin (cooked) - 3 oz have 24 g of protein.  Cheese (hard) - 1 oz has 7 g of protein.  Chicken breast, boneless and skinless (cooked) - 3 oz have 13.4 g of protein.  Cottage cheese - 1/2 cup has 13.4 g of protein.  Egg - 1 egg has 6 g of protein.  Fish, filet (cooked) - 1 oz has 6-7 g of protein.  Garbanzo beans (canned or cooked) - 1/2 cup has 6-7 g of protein.  Kidney beans (canned or cooked) -  1/2 cup has 6-7 g of protein.  Lamb (cooked) - 3 oz has 24 g of protein.  Milk - 1 cup (8 oz) has 8 g of protein.  Nuts (peanuts, pistachios, almonds) - 1 oz has 6 g of protein.  Peanut butter - 1 oz has 7-8 g of protein.  Pork tenderloin (cooked) - 3 oz has 18.4 g of protein.  Pumpkin seeds - 1 oz has 8.5 g of protein.  Soybeans (roasted) - 1 oz has 8 g of protein.  Soybeans (cooked) - 1/2 cup has 11 g of protein.  Soy milk - 1 cup (8 oz) has 5-10 g of protein.  Soy or vegetable patty - 1 patty has 11 g of protein.  Sunflower seeds - 1 oz has 5.5 g of protein.  Tofu (firm) - 1/2 cup has 20 g of protein.  Tuna (canned in water) - 3 oz has 20 g of protein.  Yogurt - 6 oz has 8 g of protein.  Low-protein foods Low-protein foods contain 3 grams (3 g) or less of protein per serving. They include:  Beets (raw or cooked) - 1/2 cup has 1.5 g of protein.  Bran cereal - 1/2 cup has 2-3 g of protein.  Bread - 1 slice has 2.5 g of protein.  Broccoli (raw or cooked) - 1/2 cup has 2 g of protein.  Collard greens (raw or cooked) - 1/2 cup has 2 g of protein.  Corn (fresh or cooked) - 1/2 cup has 2 g of protein.  Cream cheese - 1 oz has 2 g of protein.  Creamer (half-and-half) - 1 oz has 1 g of protein.  Flour tortilla - 1 tortilla has 2.5 g  of protein  Frozen yogurt - 1/2 cup has 3 g of protein.  Fruit or vegetable juice - 1/2 cup has 1 g of protein.  Green beans (raw or cooked) - 1/2 cup has 1 g of protein.  Green peas (canned) - 1/2 cup has 3.5 g of protein.  Muffins - 1 small muffin (2 oz) has 3 g of protein.  Oatmeal (cooked) - 1/2 cup has 3 g of protein.  Potato (baked with skin) - 1 medium potato has 3 g of protein.  Rice (cooked) - 1/2 cup has 2.5-3.5 g of protein.  Sour cream - 1/2 cup has 2.5 g of protein.  Spinach (cooked) - 1/2 cup has 3 g of protein.  Squash (cooked) - 1/2 cup has 1.5 g of protein.  Actual amounts of protein may be different depending on processing. Talk with your health care provider or dietitian about what foods are recommended for you. This information is not intended to replace advice given to you by your health care provider. Make sure you discuss any questions you have with your health care provider. Document Released: 05/15/2015 Document Revised: 10/25/2015 Document Reviewed: 10/25/2015 Elsevier Interactive Patient Education  [employer].

## 2017-03-01 NOTE — Progress Notes (Signed)
Patient ID: April Donaldson, female   DOB: [birth year], 84 y.o.   MRN: [id number] Medical screening examination/treatment/procedure(s) were performed by non-physician practitioner and as supervising physician I was immediately available for consultation/collaboration. I agree with above. Hoyt Koch, MD

## 2017-03-20 ENCOUNTER — Ambulatory Visit (INDEPENDENT_AMBULATORY_CARE_PROVIDER_SITE_OTHER): Payer: Medicare Other | Admitting: Internal Medicine

## 2017-03-20 ENCOUNTER — Encounter: Payer: Self-pay | Admitting: Internal Medicine

## 2017-03-20 DIAGNOSIS — M25511 Pain in right shoulder: Secondary | ICD-10-CM

## 2017-03-20 NOTE — Patient Instructions (Signed)
It is okay to use tylenol for the pain.  This is likely coming from a muscle.

## 2017-03-20 NOTE — Progress Notes (Signed)
   Subjective:    Patient ID: April Donaldson, female    DOB: 06/01/1931, 84 y.o.   MRN: [id number]  HPI The patient is an 84 YO female coming in for right shoulder and back pain. Started about 1 week ago. She had been doing some lifting but denies overuse or known injury. She did not take anything for it. Has been taking the garbage out lately but is not sure if that is related. Since this is her dominant hand she is slightly limited due to pain in her activities. She is able to do most all activities. Pain 4/10. She is not sure that she wants medication for it as she has many drug allergies and does not tolerate medications well. Denies chest pains or SOB. Worse with bending or twisting or lifting. Not worse with exertion. Denies cough or recent cold.   Review of Systems  Constitutional: Positive for activity change. Negative for appetite change, chills, fatigue, fever and unexpected weight change.  Respiratory: Negative.   Cardiovascular: Negative.   Gastrointestinal: Negative.   Musculoskeletal: Positive for arthralgias, back pain and myalgias. Negative for gait problem and joint swelling.  Skin: Negative.   Neurological: Negative.       Objective:   Physical Exam  Constitutional: She is oriented to person, place, and time. She appears well-developed and well-nourished.  HENT:  Head: Normocephalic and atraumatic.  Eyes: EOM are normal.  Neck: Normal range of motion.  Cardiovascular: Normal rate and regular rhythm.  Pulmonary/Chest: Effort normal and breath sounds normal. No respiratory distress. She has no wheezes. She has no rales.  Abdominal: Soft.  Musculoskeletal: She exhibits tenderness. She exhibits no edema.  Pain in the right shoulder and scapular region to touch mild and ROM passive intact.   Neurological: She is alert and oriented to person, place, and time. Coordination normal.  Skin: Skin is warm and dry.   Vitals:   03/20/17 1600  BP: (!) 142/86  Pulse: (!) 51    Temp: 97.8 F (36.6 C)  TempSrc: Oral  SpO2: 99%  Weight: 189 lb (85.7 kg)  Height: 4\' 11"  (1.499 m)      Assessment & Plan:

## 2017-03-21 DIAGNOSIS — M25511 Pain in right shoulder: Secondary | ICD-10-CM | POA: Insufficient documentation

## 2017-03-21 NOTE — Assessment & Plan Note (Addendum)
Rx for voltaren gel offered but she declines. Advised she can use tylenol. No symptoms to suggest need for imaging or labs today. If no improvement in 1 week she will need x-ray of the shoulder.

## 2017-03-29 LAB — BASIC METABOLIC PANEL
BUN: 19 (ref 4–21)
Creatinine: 1.1 (ref 0.5–1.1)
GLUCOSE: 85
POTASSIUM: 3.6 (ref 3.4–5.3)
SODIUM: 138 (ref 137–147)

## 2017-04-05 ENCOUNTER — Other Ambulatory Visit: Payer: Self-pay | Admitting: Urology

## 2017-04-05 ENCOUNTER — Encounter: Payer: Self-pay | Admitting: Internal Medicine

## 2017-04-06 ENCOUNTER — Encounter (HOSPITAL_COMMUNITY): Payer: Self-pay | Admitting: *Deleted

## 2017-04-06 ENCOUNTER — Other Ambulatory Visit: Payer: Self-pay

## 2017-04-06 NOTE — H&P (Signed)
Office Visit Report     03/22/2017   --------------------------------------------------------------------------------   April Donaldson  MRN: [id number]  PRIMARY CARE:  Gwendolyn Grant, MD  DOB: 1931-05-29, 84 year old Female  REFERRING:    SSN: [id number]  PROVIDER:  Raynelle Bring, M.D.    LOCATION:  [facility] [phone]   --------------------------------------------------------------------------------   CC/HPI: Hematuria   April Donaldson returns today after having developed the acute onset of gross hematuria on Saturday. Although she denied any other associated symptoms or pain, she states that she has actually noted some mild right-sided flank pain over the last week. She has denied any fevers. She has no history of urolithiasis. She was seen earlier this week by Azucena Fallen, NP and a renal ultrasound was performed. This demonstrated no change in her angiomyolipoma, no hydronephrosis, and no other findings that would explain her hematuria. She presents today for cystoscopy and further evaluation. She states she has had some ongoing hematuria although this is mostly resolved. She will still have some dark colored urine and will notice some blood on the toilet paper after urinating.     ALLERGIES: Apresoline TABS Benicar TABS Cardizem TABS CloNIDine HCl TABS Contrast Dye Iodine SOLN Labetalol HCl TABS Norvasc TABS Tetracyclines    MEDICATIONS: Allopurinol 100 mg tablet PRN  Aspirin Childrens 81 MG Oral Tablet Chewable Oral  Atenolol 100 mg tablet Oral  Caltrate-600 Plus 600 mg calcium-400 unit tablet Oral  Centrum Silver 400 mcg-250 mcg tablet, chewable Oral  CPAP  Dorzolamide-Timolol 22.3 mg-6.8 mg/ml drops Ophthalmic  Levoxyl 50 MCG Oral Tablet Oral  Lisinopril 5 mg tablet Oral  Simvastatin 20 MG Oral Tablet Oral  Triamterene-Hydrochlorothiazid 37.5 mg-25 mg capsule Oral  Xalatan 0.005 % Ophthalmic Solution Ophthalmic     GU PSH: Hysterectomy Unilat SO -  2008 Ovary Removal Partial or Total - 2008      PSH Notes: Hand Surgery, Thyroid Surgery, Hysterectomy, Sinus Surgery, Oophorectomy   NON-GU PSH: None   GU PMH: Gross hematuria - 03/19/2017      PMH Notes:   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 Her AML has been confirmed by MRI and has been followed with renal ultrasonography. At the patient's request, she receives ultrasounds every 6 months due to her extreme anxiety about her renal mass despite my reassurances.   2) Recurrent UTIS: She also has a history of recurrent UTIs.        NON-GU PMH: Benign lipomatous neoplasm of kidney, Angiomyolipoma of kidney - 2017 Personal history of other diseases of the digestive system, History of esophageal reflux - 2014 Personal history of other diseases of the nervous system and sense organs, History of glaucoma - 2014, History of sleep apnea, - 2014 Personal history of other infectious and parasitic diseases, History of hepatitis - 2014 Hypercholesterolemia Hypertension    FAMILY HISTORY: Death - Son renal failure - Father   SOCIAL HISTORY: Marital Status: Single Preferred Language: English; Ethnicity: Not Hispanic Or Latino; Race: Black or African American Current Smoking Status: Patient has never smoked.   Tobacco Use Assessment Completed: Used Tobacco in last 30 days? Has never drank.  Does not drink caffeine.     Notes: Never A Smoker, Tobacco Use, Alcohol Use, Marital History - Single, caffeine: rare    REVIEW OF SYSTEMS:    GU Review Female:   Patient denies frequent urination, hard to postpone urination, burning /pain with urination, get  up at night to urinate, leakage of urine, stream starts and stops, trouble starting your stream, have to strain to urinate, and currently pregnant.  Gastrointestinal (Upper):   Patient denies nausea and vomiting.  Gastrointestinal (Lower):   Patient denies diarrhea and  constipation.  Constitutional:   Patient denies fever, night sweats, weight loss, and fatigue.  Skin:   Patient denies skin rash/ lesion and itching.  Eyes:   Patient denies blurred vision and double vision.  Ears/ Nose/ Throat:   Patient denies sore throat and sinus problems.  Hematologic/Lymphatic:   Patient denies swollen glands and easy bruising.  Cardiovascular:   Patient denies chest pains and leg swelling.  Respiratory:   Patient denies cough and shortness of breath.  Endocrine:   Patient denies excessive thirst.  Musculoskeletal:   Patient denies back pain and joint pain.  Neurological:   Patient denies headaches and dizziness.  Psychologic:   Patient denies depression and anxiety.   VITAL SIGNS:      03/22/2017 12:55 PM  Weight 187 lb / 84.82 kg  Height 59 in / 149.86 cm  BP 169/90 mmHg  Pulse 48 /min  BMI 37.8 kg/m   GU PHYSICAL EXAMINATION:      Notes: Her external genitalia is normal. No masses within the vagina. Her urethral meatus is normal.   MULTI-SYSTEM PHYSICAL EXAMINATION:    Constitutional: Well-nourished. No physical deformities. Normally developed. Good grooming.  Respiratory: No labored breathing, no use of accessory muscles.   Cardiovascular: Normal temperature, normal extremity pulses, no swelling, no varicosities.  Gastrointestinal: Mild right CVAT. No abdominal masses or tenderness.     PAST DATA REVIEWED:  Source Of History:  Patient  Urine Test Review:   Urinalysis  X-Ray Review: Renal Ultrasound: Reviewed Films.  C.T. Abdomen/Pelvis: Reviewed Films.    Notes:                     I reviewed her CT scan without contrast. This was performed without contrast due to her IV contrast allergy. This did not demonstrate any evidence of urolithiasis, hydronephrosis, or other obvious causes for her hematuria.   PROCEDURES:         Flexible Cystoscopy - 52000  Risks, benefits, and some of the potential complications of the procedure were discussed at length  with the patient including infection, bleeding, voiding discomfort, urinary retention, fever, chills, sepsis, and others. All questions were answered. Informed consent was obtained. Antibiotic prophylaxis was given. Sterile technique and intraurethral analgesia were used.  Meatus:  Normal size. Normal location. Normal condition.  Urethra:  No hypermobility. No leakage.  Ureteral Orifices:  Normal location. Normal size. Normal shape. Effluxed clear urine.  Bladder:  No trabeculation. No tumors. Normal mucosa. No stones.      Chaperone: AJ The lower urinary tract was carefully examined. The procedure was well-tolerated and without complications. Antibiotic instructions were given. Instructions were given to call the office immediately for bloody urine, difficulty urinating, urinary retention, painful or frequent urination, fever, chills, nausea, vomiting or other illness. The patient stated that she understood these instructions and would comply with them.         C.T. Urogram - [id number]               Urinalysis w/Scope Dipstick Dipstick Cont'd Micro  Color: Yellow Bilirubin: Neg WBC/hpf: NS (Not Seen)  Appearance: Cloudy Ketones: Neg RBC/hpf: >60/hpf  Specific Gravity: 1.015 Blood: 3+ Bacteria: NS (Not Seen)  pH: 7.5 Protein: 1+ Cystals:  NS (Not Seen)  Glucose: Neg Urobilinogen: 0.2 Casts: NS (Not Seen)    Nitrites: Neg Trichomonas: Not Present    Leukocyte Esterase: Neg Mucous: Not Present      Epithelial Cells: 0 - 5/hpf      Yeast: NS (Not Seen)      Sperm: Not Present    ASSESSMENT:      ICD-10 Details  1 GU:   Gross hematuria - R31.0    PLAN:           Orders X-Rays: C.T. Stone Protocol Without Contrast  X-Ray Notes: . History:  Hematuria: Yes/No  Patient to see MD after exam: Yes/No  Previous exam: CT / IVP/ US/ KUB/ None  When:  Where:  Diabetic: Yes/ No  BUN/ Creatinine:  Date of last BUN Creatinine:  Weight in pounds:  Allergy- IV Contrast: Yes/  No  Conflicting diabetic meds: Yes/ No  Diabetic Meds:  Prior Authorization #: [id number]           Schedule Return Visit/Planned Activity: Other See Visit Notes             Note: Will call to schedule surgery.          Document Letter(s):  Created for Patient: Clinical Summary         Notes:   1. Gross hematuria: She has no findings on cystoscopy or her noncontrast CT or renal ultrasound that would demonstrate an obvious cause for her hematuria. Due to her IV contrast allergy, I have offered her the option of proceeding with a contrasted CT scan with steroid preparation versus cystoscopy with retrograde pyelography. She has elected the latter approach and this will be arranged. We did review this procedure in detail including the potential risks, complications, and expected recovery process. This will be scheduled for the near future.   2. Right renal angiomyolipoma: This has remained stable. She will currently keep her scheduled appointment for later this spring.   Cc: Dr. Gwendolyn Grant    * Signed by Raynelle Bring, M.D. on 03/22/17 at 10:12 PM (EST)*

## 2017-04-09 ENCOUNTER — Encounter (HOSPITAL_COMMUNITY)
Admission: RE | Disposition: A | Discharge status: Home / Self Care | Payer: Self-pay | Source: Ambulatory Visit | Attending: Urology

## 2017-04-09 ENCOUNTER — Encounter (HOSPITAL_COMMUNITY): Payer: Self-pay | Admitting: *Deleted

## 2017-04-09 ENCOUNTER — Other Ambulatory Visit: Payer: Self-pay

## 2017-04-09 ENCOUNTER — Ambulatory Visit (HOSPITAL_COMMUNITY): Payer: Medicare Other | Admitting: Anesthesiology

## 2017-04-09 ENCOUNTER — Ambulatory Visit (HOSPITAL_COMMUNITY): Payer: Medicare Other

## 2017-04-09 ENCOUNTER — Ambulatory Visit (HOSPITAL_COMMUNITY)
Admission: RE | Admit: 2017-04-09 | Discharge: 2017-04-09 | Disposition: A | Discharge status: Home / Self Care | Payer: Medicare Other | Source: Ambulatory Visit | Attending: Urology | Admitting: Urology

## 2017-04-09 DIAGNOSIS — Z79899 Other long term (current) drug therapy: Secondary | ICD-10-CM | POA: Diagnosis not present

## 2017-04-09 DIAGNOSIS — I10 Essential (primary) hypertension: Secondary | ICD-10-CM | POA: Diagnosis not present

## 2017-04-09 DIAGNOSIS — I739 Peripheral vascular disease, unspecified: Secondary | ICD-10-CM | POA: Diagnosis not present

## 2017-04-09 DIAGNOSIS — H409 Unspecified glaucoma: Secondary | ICD-10-CM | POA: Diagnosis not present

## 2017-04-09 DIAGNOSIS — E7800 Pure hypercholesterolemia, unspecified: Secondary | ICD-10-CM | POA: Diagnosis not present

## 2017-04-09 DIAGNOSIS — D1771 Benign lipomatous neoplasm of kidney: Secondary | ICD-10-CM | POA: Diagnosis not present

## 2017-04-09 DIAGNOSIS — R310 Gross hematuria: Secondary | ICD-10-CM | POA: Diagnosis present

## 2017-04-09 DIAGNOSIS — Z6837 Body mass index (BMI) 37.0-37.9, adult: Secondary | ICD-10-CM | POA: Insufficient documentation

## 2017-04-09 DIAGNOSIS — E669 Obesity, unspecified: Secondary | ICD-10-CM | POA: Diagnosis not present

## 2017-04-09 DIAGNOSIS — E039 Hypothyroidism, unspecified: Secondary | ICD-10-CM | POA: Insufficient documentation

## 2017-04-09 DIAGNOSIS — Z7982 Long term (current) use of aspirin: Secondary | ICD-10-CM | POA: Insufficient documentation

## 2017-04-09 DIAGNOSIS — G4730 Sleep apnea, unspecified: Secondary | ICD-10-CM | POA: Insufficient documentation

## 2017-04-09 DIAGNOSIS — Z841 Family history of disorders of kidney and ureter: Secondary | ICD-10-CM | POA: Insufficient documentation

## 2017-04-09 HISTORY — DX: Dyspnea, unspecified: R06.00

## 2017-04-09 HISTORY — PX: CYSTOSCOPY/RETROGRADE/URETEROSCOPY: SHX5316

## 2017-04-09 HISTORY — DX: Failed or difficult intubation, initial encounter: T88.4XXA

## 2017-04-09 HISTORY — DX: Inflammatory liver disease, unspecified: K75.9

## 2017-04-09 LAB — BASIC METABOLIC PANEL
ANION GAP: 11 (ref 5–15)
BUN: 24 mg/dL — ABNORMAL HIGH (ref 6–20)
CALCIUM: 8.7 mg/dL — AB (ref 8.9–10.3)
CO2: 24 mmol/L (ref 22–32)
Chloride: 107 mmol/L (ref 101–111)
Creatinine, Ser: 1.08 mg/dL — ABNORMAL HIGH (ref 0.44–1.00)
GFR calc non Af Amer: 46 mL/min — ABNORMAL LOW (ref >60)
GFR, EST AFRICAN AMERICAN: 53 mL/min — AB (ref >60)
GLUCOSE: 106 mg/dL — AB (ref 65–99)
POTASSIUM: 3.8 mmol/L (ref 3.5–5.1)
Sodium: 142 mmol/L (ref 135–145)

## 2017-04-09 LAB — CBC
HEMATOCRIT: 40 % (ref 36.0–46.0)
HEMOGLOBIN: 13.6 g/dL (ref 12.0–15.0)
MCH: 30.6 pg (ref 26.0–34.0)
MCHC: 34 g/dL (ref 30.0–36.0)
MCV: 89.9 fL (ref 78.0–100.0)
Platelets: 299 10*3/uL (ref 150–400)
RBC: 4.45 MIL/uL (ref 3.87–5.11)
RDW: 13.7 % (ref 11.5–15.5)
WBC: 8.6 10*3/uL (ref 4.0–10.5)

## 2017-04-09 SURGERY — CYSTOSCOPY/RETROGRADE/URETEROSCOPY
Anesthesia: General | Laterality: Bilateral

## 2017-04-09 MED ORDER — DIPHENHYDRAMINE HCL 50 MG/ML IJ SOLN
INTRAMUSCULAR | Status: AC
  Filled 2017-04-09: qty 1

## 2017-04-09 MED ORDER — LIDOCAINE 2% (20 MG/ML) 5 ML SYRINGE
INTRAMUSCULAR | Status: AC
  Filled 2017-04-09: qty 5

## 2017-04-09 MED ORDER — PHENYLEPHRINE 40 MCG/ML (10ML) SYRINGE FOR IV PUSH (FOR BLOOD PRESSURE SUPPORT)
PREFILLED_SYRINGE | INTRAVENOUS | Status: AC
  Filled 2017-04-09: qty 10

## 2017-04-09 MED ORDER — FENTANYL CITRATE (PF) 100 MCG/2ML IJ SOLN
INTRAMUSCULAR | Status: AC
  Filled 2017-04-09: qty 2

## 2017-04-09 MED ORDER — ROCURONIUM BROMIDE 10 MG/ML (PF) SYRINGE
PREFILLED_SYRINGE | INTRAVENOUS | Status: AC
  Filled 2017-04-09: qty 5

## 2017-04-09 MED ORDER — ONDANSETRON HCL 4 MG/2ML IJ SOLN
INTRAMUSCULAR | Status: DC | PRN
  Administered 2017-04-09: 4 mg via INTRAVENOUS

## 2017-04-09 MED ORDER — PHENAZOPYRIDINE HCL 100 MG PO TABS: 100.0000 mg | ORAL_TABLET | Freq: Three times a day (TID) | ORAL | 0 refills | Status: DC | PRN

## 2017-04-09 MED ORDER — FENTANYL CITRATE (PF) 100 MCG/2ML IJ SOLN
INTRAMUSCULAR | Status: DC | PRN
  Administered 2017-04-09: 50 ug via INTRAVENOUS

## 2017-04-09 MED ORDER — PROPOFOL 10 MG/ML IV BOLUS
INTRAVENOUS | Status: AC
  Filled 2017-04-09: qty 20

## 2017-04-09 MED ORDER — DIPHENHYDRAMINE HCL 50 MG/ML IJ SOLN
INTRAMUSCULAR | Status: DC | PRN
  Administered 2017-04-09: 25 mg via INTRAVENOUS

## 2017-04-09 MED ORDER — ONDANSETRON HCL 4 MG/2ML IJ SOLN
INTRAMUSCULAR | Status: AC
  Filled 2017-04-09: qty 2

## 2017-04-09 MED ORDER — CEFAZOLIN SODIUM-DEXTROSE 2-4 GM/100ML-% IV SOLN
2.0000 g | Freq: Once | INTRAVENOUS | Status: AC
  Administered 2017-04-09: 2 g via INTRAVENOUS

## 2017-04-09 MED ORDER — PROPOFOL 10 MG/ML IV BOLUS
INTRAVENOUS | Status: DC | PRN
  Administered 2017-04-09: 50 mg via INTRAVENOUS
  Administered 2017-04-09: 150 mg via INTRAVENOUS

## 2017-04-09 MED ORDER — SODIUM CHLORIDE 0.9 % IR SOLN
Status: DC | PRN
  Administered 2017-04-09: 3000 mL

## 2017-04-09 MED ORDER — DEXAMETHASONE SODIUM PHOSPHATE 10 MG/ML IJ SOLN
INTRAMUSCULAR | Status: AC
  Filled 2017-04-09: qty 1

## 2017-04-09 MED ORDER — PHENYLEPHRINE HCL 10 MG/ML IJ SOLN
INTRAMUSCULAR | Status: DC | PRN
  Administered 2017-04-09: 120 ug via INTRAVENOUS
  Administered 2017-04-09: 80 ug via INTRAVENOUS

## 2017-04-09 MED ORDER — LACTATED RINGERS IV SOLN
INTRAVENOUS | Status: DC
  Administered 2017-04-09 (×2): via INTRAVENOUS

## 2017-04-09 MED ORDER — DEXAMETHASONE SODIUM PHOSPHATE 10 MG/ML IJ SOLN
INTRAMUSCULAR | Status: DC | PRN
  Administered 2017-04-09: 10 mg via INTRAVENOUS

## 2017-04-09 MED ORDER — IOHEXOL 300 MG/ML  SOLN
INTRAMUSCULAR | Status: DC | PRN
  Administered 2017-04-09: 13 mL

## 2017-04-09 SURGICAL SUPPLY — 18 items
BAG URO CATCHER STRL LF (MISCELLANEOUS) ×3 IMPLANT
BASKET ZERO TIP NITINOL 2.4FR (BASKET) IMPLANT
CATH INTERMIT  6FR 70CM (CATHETERS) ×3 IMPLANT
CATH URET 5FR 28IN CONE TIP (BALLOONS) ×2
CATH URET 5FR 70CM CONE TIP (BALLOONS) ×1 IMPLANT
CLOTH BEACON ORANGE TIMEOUT ST (SAFETY) ×3 IMPLANT
COVER FOOTSWITCH UNIV (MISCELLANEOUS) IMPLANT
FIBER LASER FLEXIVA 365 (UROLOGICAL SUPPLIES) IMPLANT
FIBER LASER TRAC TIP (UROLOGICAL SUPPLIES) IMPLANT
GLOVE BIOGEL M STRL SZ7.5 (GLOVE) ×3 IMPLANT
GOWN STRL REUS W/TWL LRG LVL3 (GOWN DISPOSABLE) ×9 IMPLANT
GUIDEWIRE ANG ZIPWIRE 038X150 (WIRE) IMPLANT
GUIDEWIRE STR DUAL SENSOR (WIRE) ×3 IMPLANT
MANIFOLD NEPTUNE II (INSTRUMENTS) ×3 IMPLANT
PACK CYSTO (CUSTOM PROCEDURE TRAY) ×3 IMPLANT
SHEATH URETERAL 12FRX35CM (MISCELLANEOUS) IMPLANT
TUBING CONNECTING 10 (TUBING) ×2 IMPLANT
TUBING CONNECTING 10' (TUBING) ×1

## 2017-04-09 NOTE — Anesthesia Preprocedure Evaluation (Addendum)
Anesthesia Evaluation  Patient identified by MRN, date of birth, ID band Patient awake    Reviewed: Allergy & Precautions, H&P , NPO status , Patient's Chart, lab work & pertinent test results, reviewed documented beta blocker date and time   History of Anesthesia Complications (+) DIFFICULT AIRWAY  Airway Mallampati: III   Neck ROM: Full    Dental  (+) Dental Advisory Given   Pulmonary sleep apnea and Continuous Positive Airway Pressure Ventilation ,    breath sounds clear to auscultation       Cardiovascular hypertension, Pt. on medications and Pt. on home beta blockers + Peripheral Vascular Disease   Rhythm:Regular Rate:Bradycardia     Neuro/Psych Anxiety Bipolar Disorder negative neurological ROS     GI/Hepatic Neg liver ROS, hiatal hernia, GERD  Controlled,  Endo/Other  Hypothyroidism Obesity  Renal/GU Renal disease  negative genitourinary   Musculoskeletal  (+) Arthritis ,   Abdominal   Peds  Hematology negative hematology ROS (+)   Anesthesia Other Findings    Reproductive/Obstetrics negative OB ROS                            Anesthesia Physical  Anesthesia Plan  ASA: III  Anesthesia Plan: General   Post-op Pain Management:    Induction: Intravenous  PONV Risk Score and Plan:   Airway Management Planned: LMA  Additional Equipment:   Intra-op Plan:   Post-operative Plan: Extubation in OR  Informed Consent: I have reviewed the patients History and Physical, chart, labs and discussed the procedure including the risks, benefits and alternatives for the proposed anesthesia with the patient or authorized representative who has indicated his/her understanding and acceptance.   Dental advisory given  Plan Discussed with: CRNA  Anesthesia Plan Comments:        Anesthesia Quick Evaluation

## 2017-04-09 NOTE — Interval H&P Note (Signed)
History and Physical Interval Note:  04/09/2017 11:49 AM  [name]  has presented today for surgery, with the diagnosis of HEMATURIA  The various methods of treatment have been discussed with the patient and family. After consideration of risks, benefits and other options for treatment, the patient has consented to  Procedure(s) with comments: CYSTOSCOPY/RETROGRADE/URETEROSCOPY (Bilateral) - ONLY NEEDS 30 MIN FOR PROCEDURE as a surgical intervention .  The patient's history has been reviewed, patient examined, no change in status, stable for surgery.  I have reviewed the patient's chart and labs.  Questions were answered to the patient's satisfaction.     Rayquan Amrhein,LES

## 2017-04-09 NOTE — Progress Notes (Signed)
Pt makes all medical decisions, pt has POWER of ATTORNEY but not LEGAL GUARDIAN.

## 2017-04-09 NOTE — Interval H&P Note (Signed)
History and Physical Interval Note:  04/09/2017 11:37 AM  [name]  has presented today for surgery, with the diagnosis of HEMATURIA  The various methods of treatment have been discussed with the patient and family. After consideration of risks, benefits and other options for treatment, the patient has consented to  Procedure(s) with comments: CYSTOSCOPY/RETROGRADE/URETEROSCOPY (Bilateral) - ONLY NEEDS 30 MIN FOR PROCEDURE as a surgical intervention .  The patient's history has been reviewed, patient examined, no change in status, stable for surgery.  I have reviewed the patient's chart and labs.  Questions were answered to the patient's satisfaction.     April Donaldson,LES

## 2017-04-09 NOTE — Transfer of Care (Signed)
Immediate Anesthesia Transfer of Care Note  Patient: April Donaldson  Procedure(s) Performed: CYSTOSCOPY/RETROGRADE (Bilateral )  Patient Location: PACU  Anesthesia Type:General  Level of Consciousness: awake, alert , oriented and patient cooperative  Airway & Oxygen Therapy: Patient Spontanous Breathing and Patient connected to face mask oxygen  Post-op Assessment: Report given to RN, Post -op Vital signs reviewed and stable and Patient moving all extremities X 4  Post vital signs: stable  Last Vitals:  Vitals:   04/09/17 1028 04/09/17 1300  BP: (!) 182/63 (!) 164/73  Pulse:  60  Resp:  14  Temp:  36.7 C  SpO2:  100%    Last Pain:  Vitals:   04/09/17 1004  TempSrc: Oral      Patients Stated Pain Goal: 4 (41/93/79 0240)  Complications: No apparent anesthesia complications

## 2017-04-09 NOTE — Anesthesia Postprocedure Evaluation (Signed)
Anesthesia Post Note  Patient: April Donaldson  Procedure(s) Performed: CYSTOSCOPY/RETROGRADE (Bilateral )     Patient location during evaluation: PACU Anesthesia Type: General Level of consciousness: awake and alert Pain management: pain level controlled Vital Signs Assessment: post-procedure vital signs reviewed and stable Respiratory status: spontaneous breathing, nonlabored ventilation and respiratory function stable Cardiovascular status: blood pressure returned to baseline and stable Postop Assessment: no apparent nausea or vomiting Anesthetic complications: no    Last Vitals:  Vitals:   04/09/17 1356 04/09/17 1406  BP: 135/69 (!) 124/109  Pulse: (!) 46 (!) 115  Resp: (!) 25   Temp:  36.4 C  SpO2: 94% 98%    Last Pain:  Vitals:   04/09/17 1406  TempSrc: Oral  PainSc:                  Audry Pili

## 2017-04-09 NOTE — Anesthesia Procedure Notes (Signed)
Procedure Name: LMA Insertion Date/Time: 04/09/2017 12:19 PM Performed by: Lissa Morales, CRNA Pre-anesthesia Checklist: Patient identified, Emergency Drugs available, Suction available and Patient being monitored Patient Re-evaluated:Patient Re-evaluated prior to induction Oxygen Delivery Method: Circle system utilized Preoxygenation: Pre-oxygenation with 100% oxygen Induction Type: IV induction Ventilation: Mask ventilation without difficulty LMA: LMA with gastric port inserted LMA Size: 4.0 Tube type: Oral (gastric sump down  port of LMA) Number of attempts: 1 Airway Equipment and Method: Oral airway Placement Confirmation: positive ETCO2 Tube secured with: Tape Dental Injury: Teeth and Oropharynx as per pre-operative assessment

## 2017-04-09 NOTE — Discharge Instructions (Signed)
1. You may see some blood in the urine and may have some burning with urination for 48-72 hours. You also may notice that you have to urinate more frequently or urgently after your procedure which is normal.  °2. You should call should you develop an inability urinate, fever > 101, persistent nausea and vomiting that prevents you from eating or drinking to stay hydrated.  °

## 2017-04-09 NOTE — Op Note (Signed)
Preoperative diagnosis: Gross hematuria  Postoperative diagnosis: Gross hematuria  Procedures: Cystoscopy with bilateral retrograde pyelography  Surgeon: Pryor Curia MD  Intraoperative findings: Retrograde pyelography was performed with 6 French ureteral catheter and Omnipaque contrast.  IV Benadryl was administered due to her history of a contrast allergy.  She was found to have normal bilateral retrograde pyelograms without evidence of filling defects or other abnormalities in the renal collecting systems or ureters bilaterally.  Anesthesia: General  EBL: Minimal  Indications: Ms. Burnett is an 84 year old female who recently had painless gross hematuria.  Renal ultrasound indicated no concerning findings to suggest an obvious cause for her hematuria.  She does have an IV contrast allergy and she therefore presents today for cystoscopy and retrograde pyelography to complete her hematuria evaluation.  We discussed the potential risks, complications, and expected recovery process and she gave informed consent to proceed.  Description of procedure: The patient was taken to the operating room and a general anesthetic was administered.  She was given preoperative antibiotics, placed in the dorsal lithotomy position, and prepped and draped in the usual sterile fashion.  Next, a preoperative timeout was performed.  Cystourethroscopy was performed with both a 30 degree and 70 degree lens.  This revealed no bladder tumors, stones, or other mucosal pathology.  The right ureteral orifice was in its expected anatomic location and effluxing clear urine.  There was noted to be an ectopic, probably reimplanted, left ureteral orifice that was effluxing clear urine located over the left lateral, posterior bladder wall.  Omnipaque contrast was then injected via a 6 [place] ureteral catheter into the right ureteral orifice.  Findings are as dictated above.  No abnormalities were noted.  I attempted to cannulate  what I thought might be the native left ureteral orifice although this appeared to be completely obliterated.  There certainly was no bloody reflux or other abnormalities to suggest a mass.  Attention then turned to the reimplanted left ureteral orifice.  Omnipaque contrast was injected via a cone-tip catheter.  This revealed no abnormalities as noted above.  The patient's bladder was then emptied.  She was able to be awakened and transferred to recovery unit in satisfactory condition.

## 2017-04-17 ENCOUNTER — Other Ambulatory Visit: Payer: Self-pay | Admitting: Internal Medicine

## 2017-05-16 ENCOUNTER — Emergency Department (HOSPITAL_COMMUNITY)
Admission: EM | Admit: 2017-05-16 | Discharge: 2017-05-16 | Disposition: A | Discharge status: Home / Self Care | Payer: Medicare Other | Attending: Emergency Medicine | Admitting: Emergency Medicine

## 2017-05-16 ENCOUNTER — Other Ambulatory Visit: Payer: Self-pay

## 2017-05-16 ENCOUNTER — Encounter (HOSPITAL_COMMUNITY): Payer: Self-pay | Admitting: Emergency Medicine

## 2017-05-16 DIAGNOSIS — I129 Hypertensive chronic kidney disease with stage 1 through stage 4 chronic kidney disease, or unspecified chronic kidney disease: Secondary | ICD-10-CM | POA: Insufficient documentation

## 2017-05-16 DIAGNOSIS — Z79899 Other long term (current) drug therapy: Secondary | ICD-10-CM | POA: Diagnosis not present

## 2017-05-16 DIAGNOSIS — N183 Chronic kidney disease, stage 3 (moderate): Secondary | ICD-10-CM | POA: Insufficient documentation

## 2017-05-16 DIAGNOSIS — E039 Hypothyroidism, unspecified: Secondary | ICD-10-CM | POA: Diagnosis not present

## 2017-05-16 DIAGNOSIS — I10 Essential (primary) hypertension: Secondary | ICD-10-CM

## 2017-05-16 DIAGNOSIS — Z7982 Long term (current) use of aspirin: Secondary | ICD-10-CM | POA: Insufficient documentation

## 2017-05-16 LAB — BASIC METABOLIC PANEL
Anion gap: 11 (ref 5–15)
BUN: 14 mg/dL (ref 6–20)
CALCIUM: 9.7 mg/dL (ref 8.9–10.3)
CO2: 25 mmol/L (ref 22–32)
Chloride: 101 mmol/L (ref 101–111)
Creatinine, Ser: 1.14 mg/dL — ABNORMAL HIGH (ref 0.44–1.00)
GFR, EST AFRICAN AMERICAN: 50 mL/min — AB (ref >60)
GFR, EST NON AFRICAN AMERICAN: 43 mL/min — AB (ref >60)
Glucose, Bld: 107 mg/dL — ABNORMAL HIGH (ref 65–99)
POTASSIUM: 3.2 mmol/L — AB (ref 3.5–5.1)
Sodium: 137 mmol/L (ref 135–145)

## 2017-05-16 LAB — CBC
HEMATOCRIT: 38.7 % (ref 36.0–46.0)
Hemoglobin: 13 g/dL (ref 12.0–15.0)
MCH: 30.1 pg (ref 26.0–34.0)
MCHC: 33.6 g/dL (ref 30.0–36.0)
MCV: 89.6 fL (ref 78.0–100.0)
PLATELETS: 262 10*3/uL (ref 150–400)
RBC: 4.32 MIL/uL (ref 3.87–5.11)
RDW: 13.3 % (ref 11.5–15.5)
WBC: 8.9 10*3/uL (ref 4.0–10.5)

## 2017-05-16 MED ORDER — TRIAMTERENE-HCTZ 37.5-25 MG PO TABS: 1.0000 | ORAL_TABLET | Freq: Once | ORAL | Status: DC

## 2017-05-16 MED ORDER — ATENOLOL 50 MG PO TABS
100.0000 mg | ORAL_TABLET | Freq: Once | ORAL | Status: AC
Start: 2017-05-16 — End: 2017-05-16
  Administered 2017-05-16: 100 mg via ORAL
  Filled 2017-05-16: qty 2

## 2017-05-16 MED ORDER — LISINOPRIL 10 MG PO TABS
5.0000 mg | ORAL_TABLET | Freq: Once | ORAL | Status: AC
  Administered 2017-05-16: 5 mg via ORAL
  Filled 2017-05-16: qty 1

## 2017-05-16 MED ORDER — ATENOLOL 50 MG PO TABS: 100.0000 mg | ORAL_TABLET | Freq: Every day | ORAL | Status: DC

## 2017-05-16 MED ORDER — TRIAMTERENE-HCTZ 37.5-25 MG PO CAPS: 1.0000 | ORAL_CAPSULE | Freq: Every day | ORAL | Status: DC

## 2017-05-16 MED ORDER — LISINOPRIL 10 MG PO TABS: 5.0000 mg | ORAL_TABLET | Freq: Every day | ORAL | Status: DC

## 2017-05-16 NOTE — ED Triage Notes (Addendum)
Pt reports hypertension today, hx of same. Reports as the day went on the pressure continued to rise. States diastolic as high at 750 today. Reports a fight with her opthalmologist today.

## 2017-05-16 NOTE — ED Provider Notes (Signed)
[facility] EMERGENCY DEPARTMENT Provider Note   CSN: [id number] Arrival date & time: 05/16/17  0051     History   Chief Complaint Chief Complaint  Patient presents with  . Hypertension    HPI April Donaldson is a 84 y.o. female.  Pt presents to the ED today with high blood pressure.  The pt took her bp yesterday and it was elevating and she was concerned when systolic got above 381 and diastolic got above 829.  She has had a stressful week and thinks it was from that, but wanted to be sure she was ok.  She denies cp, sob, h/a.      Past Medical History:  Diagnosis Date  . Anxiety   . Benign neoplasm of kidney, except pelvis   . Cellulitis of leg 03/09/2013 hosp   . Cerebrovascular disease   . Chronic low back pain   . Difficult intubation    per patient with hand surgery in 2016 at surgical center- lip was pinched and swollen after surgery- patient states dr Burney Gauze never stated a problem with intubation   . DJD (degenerative joint disease)   . Dyspnea    with exertion   . GERD (gastroesophageal reflux disease)    no meds  . Glaucoma   . Gout   . H/O hiatal hernia   . Hepatitis    hx of years ago   . Hx of colonic polyps   . Hypercholesteremia   . Hypertension   . Hypothyroid   . IBS (irritable bowel syndrome)   . Obesity   . OSA (obstructive sleep apnea)    NPSG 2009: AHI 20/h. CPAP - follows with [facility]  . Renal insufficiency    one kidney small and non funtioning  . Tubular adenoma 2000  . Venous insufficiency     Patient Active Problem List   Diagnosis Date Noted  . Right shoulder pain 03/21/2017  . Candidal skin infection 07/21/2016  . Vertigo 04/19/2016  . Hair thinning 12/18/2014  . Routine general medical examination at a health care facility 06/15/2014  . Obstructive sleep apnea 11/11/2007  . GLAUCOMA 09/22/2007  . CKD (chronic kidney disease) stage 3, GFR 30-59 ml/min (HCC) 03/21/2007  . Hypothyroidism 01/04/2007  .  Hyperlipidemia 01/04/2007  . GOUT 01/04/2007  . Morbid obesity ([place]) 01/04/2007  . Essential hypertension 01/04/2007    Past Surgical History:  Procedure Laterality Date  . ABDOMINAL HYSTERECTOMY    . APPENDECTOMY    . cataracts Bilateral   . COLONOSCOPY    . CYSTOSCOPY/RETROGRADE/URETEROSCOPY Bilateral 04/09/2017   Procedure: CYSTOSCOPY/RETROGRADE;  Surgeon: Raynelle Bring, MD;  Location: [facility];  Service: Urology;  Laterality: Bilateral;  ONLY NEEDS 30 MIN FOR PROCEDURE  . DILATION AND CURETTAGE OF UTERUS    . FUNCTIONAL ENDOSCOPIC SINUS SURGERY  05/2003   Dr. Ernesto Rutherford  . hysterctomy and ooperectomy  1970's  . MASS EXCISION  2003   lt  . MASS EXCISION Left 09/17/2013   Procedure: EXCISION MASS LEFT HAND;  Surgeon: Schuyler Amor, MD;  Location: [facility];  Service: Orthopedics;  Laterality: Left;  . thyroid lobectomy for a cyst  10/2000   Dr. Ernesto Rutherford  . TONSILLECTOMY      OB History    No data available       Home Medications    Prior to Admission medications   Medication Sig Start Date End Date Taking? Authorizing Provider  allopurinol (ZYLOPRIM) 100 MG tablet TAKE 1 TABLET (  100 MG TOTAL) BY MOUTH DAILY. 04/17/17   Hoyt Koch, MD  aspirin 81 MG tablet Take 81 mg by mouth daily.      [provider]  atenolol (TENORMIN) 50 MG tablet TAKE 2 TABLETS DAILY Patient taking differently: Take 100 mg by mouth daily 07/17/16   Hoyt Koch, MD  Calcium Carbonate-Vitamin D (CALTRATE 600+D) 600-400 MG-UNIT per tablet Take 1 tablet by mouth 2 (two) times daily.      [provider]  dorzolamide-timolol (COSOPT) 22.3-6.8 MG/ML ophthalmic solution Place 1 drop into both eyes 2 (two) times daily.    [provider]  latanoprost (XALATAN) 0.005 % ophthalmic solution Place 1 drop into both eyes at bedtime.     [provider]  levothyroxine (SYNTHROID, LEVOTHROID) 50 MCG tablet TAKE 1 TABLET BY MOUTH EVERY DAY BEFORE  BREAKFAST, NEEDS OFFICE VISIT FOR REFILLS Patient taking differently: TAKE 50 MCG BY MOUTH EVERY DAY BEFORE BREAKFAST, NEEDS OFFICE VISIT FOR REFILLS 01/15/17   Hoyt Koch, MD  lisinopril (PRINIVIL,ZESTRIL) 5 MG tablet Take 5 mg by mouth daily.     [provider]  Multiple Vitamins-Minerals (CENTRUM SILVER PO) Take 1 tablet by mouth every other day.     [provider]  nystatin-triamcinolone ointment (MYCOLOG) Apply 1 application topically 2 (two) times daily. Patient taking differently: Apply 1 application topically 2 (two) times daily as needed (for rash).  07/21/16   Hoyt Koch, MD  phenazopyridine (PYRIDIUM) 100 MG tablet Take 1 tablet (100 mg total) by mouth 3 (three) times daily as needed for pain (for burning). 04/09/17   Raynelle Bring, MD  simvastatin (ZOCOR) 20 MG tablet TAKE 1 TABLET DAILY AT 6 P.M. Patient taking differently: Take 20 mg by mouth daily at 6pm 02/07/17   Hoyt Koch, MD  triamterene-hydrochlorothiazide (DYAZIDE) 37.5-25 MG capsule TAKE ONE CAPSULE BY MOUTH ONCE DAILY 02/14/17   Hoyt Koch, MD    Family History Family History  Problem Relation Age of Onset  . Heart disease Mother   . Asthma Brother   . Throat cancer Brother 104  . Asthma Sister   . Brain cancer Sister 57  . Breast cancer Sister 65    Social History Social History   Tobacco Use  . Smoking status: Never Smoker  . Smokeless tobacco: Never Used  Substance Use Topics  . Alcohol use: No    Alcohol/week: 0.0 oz  . Drug use: No     Allergies   Amlodipine besylate; Clonidine; Diltiazem hcl; Klonopin [clonazepam]; Tetracycline; Doxycycline hyclate; Hydralazine; Iodine; Labetalol hcl; Lidocaine; and Olmesartan medoxomil   Review of Systems Review of Systems  All other systems reviewed and are negative.    Physical Exam Updated Vital Signs BP (!) 161/73   Pulse (!) 54   Temp 98.3 F (36.8 C) (Oral)   Resp (!) 22   Ht 4\' 11"   (1.499 m)   Wt 84.4 kg (186 lb)   SpO2 98%   BMI 37.57 kg/m   Physical Exam  Constitutional: She is oriented to person, place, and time. She appears well-developed and well-nourished.  HENT:  Head: Normocephalic and atraumatic.  Right Ear: External ear normal.  Left Ear: External ear normal.  Nose: Nose normal.  Mouth/Throat: Oropharynx is clear and moist.  Eyes:  Right eye glaucoma (currently getting treated)  Neck: Normal range of motion. Neck supple.  Cardiovascular: Normal rate, regular rhythm, normal heart sounds and intact distal pulses.  Pulmonary/Chest: Effort normal and  breath sounds normal.  Abdominal: Soft. Bowel sounds are normal.  Musculoskeletal: Normal range of motion.  Neurological: She is alert and oriented to person, place, and time.  Skin: Skin is warm. Capillary refill takes less than 2 seconds.  Psychiatric: She has a normal mood and affect. Her behavior is normal. Judgment and thought content normal.  Nursing note and vitals reviewed.    ED Treatments / Results  Labs (all labs ordered are listed, but only abnormal results are displayed) Labs Reviewed  BASIC METABOLIC PANEL - Abnormal; Notable for the following components:      Result Value   Potassium 3.2 (*)    Glucose, Bld 107 (*)    Creatinine, Ser 1.14 (*)    GFR calc non Af Amer 43 (*)    GFR calc Af Amer 50 (*)    All other components within normal limits  CBC    EKG  EKG Interpretation  Date/Time:  Wednesday May 16 2017 07:23:05 EDT Ventricular Rate:  50 PR Interval:    QRS Duration: 88 QT Interval:  463 QTC Calculation: 423 R Axis:   -21 Text Interpretation:  Sinus rhythm Borderline left axis deviation Low voltage, precordial leads Borderline T abnormalities, diffuse leads Baseline wander in lead(s) V2 V6 No significant change since last tracing Confirmed by Isla Pence [phone]) on 05/16/2017 7:36:32 AM       Radiology No results found.  Procedures Procedures (including  critical care time)  Medications Ordered in ED Medications  triamterene-hydrochlorothiazide (MAXZIDE-25) 37.5-25 MG per tablet 1 tablet (1 tablet Oral Not Given 05/16/17 0733)  atenolol (TENORMIN) tablet 100 mg (100 mg Oral Given 05/16/17 0734)  lisinopril (PRINIVIL,ZESTRIL) tablet 5 mg (5 mg Oral Given 05/16/17 0734)     Initial Impression / Assessment and Plan / ED Course  I have reviewed the triage vital signs and the nursing notes.  Pertinent labs & imaging results that were available during my care of the patient were reviewed by me and considered in my medical decision making (see chart for details).  BP is down without treatment.  Pt given morning doses of meds.  Pt is stable for d/c.  She is to f/u with pcp regarding any bp med changes.     Final Clinical Impressions(s) / ED Diagnoses   Final diagnoses:  Essential hypertension    ED Discharge Orders    None       Isla Pence, MD 05/16/17 [phone]

## 2017-05-18 ENCOUNTER — Encounter: Payer: Self-pay | Admitting: Family

## 2017-05-18 ENCOUNTER — Ambulatory Visit (INDEPENDENT_AMBULATORY_CARE_PROVIDER_SITE_OTHER): Payer: Medicare Other | Admitting: Family

## 2017-05-18 VITALS — BP 158/88 | HR 58 | Temp 98.4°F | Ht 59.0 in | Wt 187.0 lb

## 2017-05-18 DIAGNOSIS — I10 Essential (primary) hypertension: Secondary | ICD-10-CM

## 2017-05-18 DIAGNOSIS — E876 Hypokalemia: Secondary | ICD-10-CM | POA: Diagnosis not present

## 2017-05-18 MED ORDER — POTASSIUM CHLORIDE ER 10 MEQ PO TBCR: 10.0000 meq | EXTENDED_RELEASE_TABLET | Freq: Every day | ORAL | 0 refills | Status: DC

## 2017-05-18 NOTE — Patient Instructions (Signed)
Please call Dr. Justin Mend to follow-up if your blood pressure remains elevated;

## 2017-05-18 NOTE — Progress Notes (Signed)
April Donaldson is a 84 y.o. female with the following history as recorded in EpicCare:  Patient Active Problem List   Diagnosis Date Noted  . Right shoulder pain 03/21/2017  . Candidal skin infection 07/21/2016  . Vertigo 04/19/2016  . Hair thinning 12/18/2014  . Routine general medical examination at a health care facility 06/15/2014  . Obstructive sleep apnea 11/11/2007  . GLAUCOMA 09/22/2007  . CKD (chronic kidney disease) stage 3, GFR 30-59 ml/min (HCC) 03/21/2007  . Hypothyroidism 01/04/2007  . Hyperlipidemia 01/04/2007  . GOUT 01/04/2007  . Morbid obesity ([place]) 01/04/2007  . Essential hypertension 01/04/2007    Current Outpatient Medications  Medication Sig Dispense Refill  . allopurinol (ZYLOPRIM) 100 MG tablet TAKE 1 TABLET (100 MG TOTAL) BY MOUTH DAILY. 90 tablet 0  . aspirin 81 MG tablet Take 81 mg by mouth daily.      Marland Kitchen atenolol (TENORMIN) 50 MG tablet TAKE 2 TABLETS DAILY (Patient taking differently: Take 100 mg by mouth daily) 180 tablet 3  . Calcium Carbonate-Vitamin D (CALTRATE 600+D) 600-400 MG-UNIT per tablet Take 1 tablet by mouth 2 (two) times daily.      . dorzolamide-timolol (COSOPT) 22.3-6.8 MG/ML ophthalmic solution Place 1 drop into both eyes 2 (two) times daily.    Marland Kitchen latanoprost (XALATAN) 0.005 % ophthalmic solution Place 1 drop into both eyes at bedtime.     Marland Kitchen levothyroxine (SYNTHROID, LEVOTHROID) 50 MCG tablet TAKE 1 TABLET BY MOUTH EVERY DAY BEFORE BREAKFAST, NEEDS OFFICE VISIT FOR REFILLS (Patient taking differently: TAKE 50 MCG BY MOUTH EVERY DAY BEFORE BREAKFAST, NEEDS OFFICE VISIT FOR REFILLS) 90 tablet 3  . lisinopril (PRINIVIL,ZESTRIL) 5 MG tablet Take 5 mg by mouth daily.     . Multiple Vitamins-Minerals (CENTRUM SILVER PO) Take 1 tablet by mouth every other day.     . nystatin-triamcinolone ointment (MYCOLOG) Apply 1 application topically 2 (two) times daily. (Patient taking differently: Apply 1 application topically 2 (two) times daily as needed (for  rash). ) 90 g 6  . phenazopyridine (PYRIDIUM) 100 MG tablet Take 1 tablet (100 mg total) by mouth 3 (three) times daily as needed for pain (for burning). 20 tablet 0  . simvastatin (ZOCOR) 20 MG tablet TAKE 1 TABLET DAILY AT 6 P.M. (Patient taking differently: Take 20 mg by mouth daily at 6pm) 90 tablet 2  . triamterene-hydrochlorothiazide (DYAZIDE) 37.5-25 MG capsule TAKE ONE CAPSULE BY MOUTH ONCE DAILY 90 capsule 3  . potassium chloride (K-DUR) 10 MEQ tablet Take 1 tablet (10 mEq total) by mouth daily. 5 tablet 0   No current facility-administered medications for this visit.     Allergies: Amlodipine besylate; Clonidine; Diltiazem hcl; Klonopin [clonazepam]; Tetracycline; Doxycycline hyclate; Hydralazine; Iodine; Labetalol hcl; Lidocaine; and Olmesartan medoxomil  Past Medical History:  Diagnosis Date  . Anxiety   . Benign neoplasm of kidney, except pelvis   . Cellulitis of leg 03/09/2013 hosp   . Cerebrovascular disease   . Chronic low back pain   . Difficult intubation    per patient with hand surgery in 2016 at surgical center- lip was pinched and swollen after surgery- patient states dr Burney Gauze never stated a problem with intubation   . DJD (degenerative joint disease)   . Dyspnea    with exertion   . GERD (gastroesophageal reflux disease)    no meds  . Glaucoma   . Gout   . H/O hiatal hernia   . Hepatitis    hx of years ago   .  Hx of colonic polyps   . Hypercholesteremia   . Hypertension   . Hypothyroid   . IBS (irritable bowel syndrome)   . Obesity   . OSA (obstructive sleep apnea)    NPSG 2009: AHI 20/h. CPAP - follows with [facility]  . Renal insufficiency    one kidney small and non funtioning  . Tubular adenoma 2000  . Venous insufficiency     Past Surgical History:  Procedure Laterality Date  . ABDOMINAL HYSTERECTOMY    . APPENDECTOMY    . cataracts Bilateral   . COLONOSCOPY    . CYSTOSCOPY/RETROGRADE/URETEROSCOPY Bilateral 04/09/2017   Procedure:  CYSTOSCOPY/RETROGRADE;  Surgeon: Raynelle Bring, MD;  Location: [facility];  Service: Urology;  Laterality: Bilateral;  ONLY NEEDS 30 MIN FOR PROCEDURE  . DILATION AND CURETTAGE OF UTERUS    . FUNCTIONAL ENDOSCOPIC SINUS SURGERY  05/2003   Dr. Ernesto Rutherford  . hysterctomy and ooperectomy  1970's  . MASS EXCISION  2003   lt  . MASS EXCISION Left 09/17/2013   Procedure: EXCISION MASS LEFT HAND;  Surgeon: Schuyler Amor, MD;  Location: [facility];  Service: Orthopedics;  Laterality: Left;  . thyroid lobectomy for a cyst  10/2000   Dr. Ernesto Rutherford  . TONSILLECTOMY      Family History  Problem Relation Age of Onset  . Heart disease Mother   . Asthma Brother   . Throat cancer Brother 19  . Asthma Sister   . Brain cancer Sister 63  . Breast cancer Sister 36    Social History   Tobacco Use  . Smoking status: Never Smoker  . Smokeless tobacco: Never Used  Substance Use Topics  . Alcohol use: No    Alcohol/week: 0.0 oz    Subjective:  F/U on hypertension; went to the ER 2 days ago with concerns that her blood pressure was very high; admits that she has had a very stressful week; no treatment was needed at that time as blood pressure normalized while in the ER; notes that in the past 2 days, her Lisinopril dosage has been increased to 7.5 mg; takes her Atenolol once per day; takes Triamterene/ HCTZ 37.5/25 mg; Denies any chest pain, shortness of breath, blurred vision or headache. Is feeling better that her blood pressure appears to be coming down with increased dosage of Lisinopril.   Nephrology is managing her blood pressure for her; she was told to follow-up with her PCP from her last ER visit; has not called her nephrologist about her concerns with her blood pressure;   Is very adamant that she does not want to take more medication or increase the dose of her current medications; she is questioning if it is safe to give the increased dosage of Lisinopril a few more days to work  before considering medication change;  Was found to have low potassium at ER- 3.2; no treatment was offered; she does admit to increased cramps in her feet/ legs recently.   Objective:  Vitals:   05/18/17 1511  BP: (!) 158/88  Pulse: (!) 58  Temp: 98.4 F (36.9 C)  TempSrc: Oral  SpO2: 98%  Weight: 187 lb (84.8 kg)  Height: 4\' 11"  (1.499 m)    General: Well developed, well nourished, in no acute distress  Skin : Warm and dry.  Head: Normocephalic and atraumatic  Lungs: Respirations unlabored; clear to auscultation bilaterally without wheeze, rales, rhonchi  CVS exam: normal rate and regular rhythm.  Neurologic: Alert and oriented; speech intact;  face symmetrical; moves all extremities well; CNII-XII intact without focal deficit   Assessment:  1. Essential hypertension   2. Hypokalemia     Plan:  1. Control appears to be improving since her nephrologist increased Lisinopril to 7.5 mg daily; she will call her nephrologist next week if continues to stay elevated; 2. Rx for K-dur 10 meq qd x 5 days; will keep follow-up with her PCP for April and level can then be re-checked.   No follow-ups on file.  No orders of the defined types were placed in this encounter.   Requested Prescriptions   Signed Prescriptions Disp Refills  . potassium chloride (K-DUR) 10 MEQ tablet 5 tablet 0    Sig: Take 1 tablet (10 mEq total) by mouth daily.

## 2017-05-29 LAB — CBC AND DIFFERENTIAL
HEMATOCRIT: 40 (ref 36–46)
HEMOGLOBIN: 13.4 (ref 12.0–16.0)
NEUTROS ABS: 4
PLATELETS: 297 (ref 150–399)
WBC: 9.7

## 2017-05-29 LAB — BASIC METABOLIC PANEL
BUN: 21 (ref 4–21)
Creatinine: 1.2 — AB (ref 0.5–1.1)
Glucose: 87
Potassium: 3.6 (ref 3.4–5.3)
SODIUM: 139 (ref 137–147)

## 2017-06-06 ENCOUNTER — Other Ambulatory Visit (INDEPENDENT_AMBULATORY_CARE_PROVIDER_SITE_OTHER): Payer: Medicare Other

## 2017-06-06 ENCOUNTER — Encounter: Payer: Self-pay | Admitting: Internal Medicine

## 2017-06-06 ENCOUNTER — Ambulatory Visit (INDEPENDENT_AMBULATORY_CARE_PROVIDER_SITE_OTHER): Payer: Medicare Other | Admitting: Internal Medicine

## 2017-06-06 VITALS — BP 140/80 | HR 50 | Temp 97.8°F | Ht 59.0 in | Wt 187.0 lb

## 2017-06-06 DIAGNOSIS — Z0000 Encounter for general adult medical examination without abnormal findings: Secondary | ICD-10-CM

## 2017-06-06 DIAGNOSIS — I10 Essential (primary) hypertension: Secondary | ICD-10-CM | POA: Diagnosis not present

## 2017-06-06 DIAGNOSIS — N183 Chronic kidney disease, stage 3 unspecified: Secondary | ICD-10-CM

## 2017-06-06 DIAGNOSIS — E785 Hyperlipidemia, unspecified: Secondary | ICD-10-CM

## 2017-06-06 DIAGNOSIS — E039 Hypothyroidism, unspecified: Secondary | ICD-10-CM

## 2017-06-06 LAB — CBC
HEMATOCRIT: 40.5 % (ref 36.0–46.0)
Hemoglobin: 13.5 g/dL (ref 12.0–15.0)
MCHC: 33.5 g/dL (ref 30.0–36.0)
MCV: 89.7 fl (ref 78.0–100.0)
PLATELETS: 313 10*3/uL (ref 150.0–400.0)
RBC: 4.51 Mil/uL (ref 3.87–5.11)
RDW: 14 % (ref 11.5–15.5)
WBC: 8.2 10*3/uL (ref 4.0–10.5)

## 2017-06-06 LAB — T4, FREE: FREE T4: 0.7 ng/dL (ref 0.60–1.60)

## 2017-06-06 LAB — COMPREHENSIVE METABOLIC PANEL
ALBUMIN: 4.1 g/dL (ref 3.5–5.2)
ALT: 21 U/L (ref 0–35)
AST: 21 U/L (ref 0–37)
Alkaline Phosphatase: 50 U/L (ref 39–117)
BILIRUBIN TOTAL: 0.4 mg/dL (ref 0.2–1.2)
BUN: 23 mg/dL (ref 6–23)
CALCIUM: 8.8 mg/dL (ref 8.4–10.5)
CO2: 28 mEq/L (ref 19–32)
Chloride: 104 mEq/L (ref 96–112)
Creatinine, Ser: 1.14 mg/dL (ref 0.40–1.20)
GFR: 58.49 mL/min — AB (ref >60.00)
GLUCOSE: 96 mg/dL (ref 70–99)
POTASSIUM: 3.5 meq/L (ref 3.5–5.1)
Sodium: 141 mEq/L (ref 135–145)
TOTAL PROTEIN: 7.7 g/dL (ref 6.0–8.3)

## 2017-06-06 LAB — LIPID PANEL
CHOLESTEROL: 188 mg/dL (ref 0–200)
HDL: 57 mg/dL (ref >39.00)
LDL Cholesterol: 108 mg/dL — ABNORMAL HIGH (ref 0–99)
NONHDL: 130.96
Total CHOL/HDL Ratio: 3
Triglycerides: 117 mg/dL (ref 0.0–149.0)
VLDL: 23.4 mg/dL (ref 0.0–40.0)

## 2017-06-06 LAB — VITAMIN D 25 HYDROXY (VIT D DEFICIENCY, FRACTURES): VITD: 62.62 ng/mL (ref 30.00–100.00)

## 2017-06-06 LAB — TSH: TSH: 1.84 u[IU]/mL (ref 0.35–4.50)

## 2017-06-06 NOTE — Assessment & Plan Note (Signed)
Checking tsh and free T4 and adjust synthroid 50 mcg daily if needed.

## 2017-06-06 NOTE — Assessment & Plan Note (Signed)
BMI 37 but complicated by osa, htn, hyperlipidemia. Encouraged exercise for some weight reduction.

## 2017-06-06 NOTE — Assessment & Plan Note (Addendum)
Declines shingrix, flu shot yearly. Tetanus and pneumonia up to date. Mammogram and dexa up to date. Colonoscopy aged out. Counseled about sun safety and mole surveillance as well as regular exercise. Given 10 year screening recommendations.

## 2017-06-06 NOTE — Progress Notes (Signed)
   Subjective:    Patient ID: [name], female    DOB: [birth year], 84 y.o.   MRN: [id number]  HPI The patient is an 84 YO female coming in for physical. Still having some sharp shooting pains in her left scalp about the same frequency.  PMH, [facility], social history reviewed and updated.   Review of Systems  Constitutional: Negative.   HENT: Negative.   Eyes: Negative.   Respiratory: Negative for cough, chest tightness and shortness of breath.   Cardiovascular: Negative for chest pain, palpitations and leg swelling.  Gastrointestinal: Negative for abdominal distention, abdominal pain, constipation, diarrhea, nausea and vomiting.  Musculoskeletal: Negative.   Skin: Negative.   Neurological: Negative.   Psychiatric/Behavioral: Negative.       Objective:   Physical Exam  Constitutional: She is oriented to person, place, and time. She appears well-developed and well-nourished.  HENT:  Head: Normocephalic and atraumatic.  Eyes: EOM are normal.  Neck: Normal range of motion.  Cardiovascular: Normal rate and regular rhythm.  Pulmonary/Chest: Effort normal and breath sounds normal. No respiratory distress. She has no wheezes. She has no rales.  Abdominal: Soft. Bowel sounds are normal. She exhibits no distension. There is no tenderness. There is no rebound.  Musculoskeletal: She exhibits no edema.  Neurological: She is alert and oriented to person, place, and time. Coordination normal.  Skin: Skin is warm and dry.  Psychiatric: She has a normal mood and affect.   Vitals:   06/06/17 0932  BP: 140/80  Pulse: (!) 50  Temp: 97.8 F (36.6 C)  TempSrc: Oral  SpO2: 98%  Weight: 187 lb (84.8 kg)  Height: 4\' 11"  (1.499 m)      Assessment & Plan:  Wants mailed labs

## 2017-06-06 NOTE — Patient Instructions (Signed)
We are checking the labs today.   Health Maintenance, Female Adopting a healthy lifestyle and getting preventive care can go a long way to promote health and wellness. Talk with your health care provider about what schedule of regular examinations is right for you. This is a good chance for you to check in with your provider about disease prevention and staying healthy. In between checkups, there are plenty of things you can do on your own. Experts have done a lot of research about which lifestyle changes and preventive measures are most likely to keep you healthy. Ask your health care provider for more information. Weight and diet Eat a healthy diet  Be sure to include plenty of vegetables, fruits, low-fat dairy products, and lean protein.  Do not eat a lot of foods high in solid fats, added sugars, or salt.  Get regular exercise. This is one of the most important things you can do for your health. ? Most adults should exercise for at least 150 minutes each week. The exercise should increase your heart rate and make you sweat (moderate-intensity exercise). ? Most adults should also do strengthening exercises at least twice a week. This is in addition to the moderate-intensity exercise.  Maintain a healthy weight  Body mass index (BMI) is a measurement that can be used to identify possible weight problems. It estimates body fat based on height and weight. Your health care provider can help determine your BMI and help you achieve or maintain a healthy weight.  For females 20 years of age and older: ? A BMI below 18.5 is considered underweight. ? A BMI of 18.5 to 24.9 is normal. ? A BMI of 25 to 29.9 is considered overweight. ? A BMI of 30 and above is considered obese.  Watch levels of cholesterol and blood lipids  You should start having your blood tested for lipids and cholesterol at 82 years of age, then have this test every 5 years.  You may need to have your cholesterol levels  checked more often if: ? Your lipid or cholesterol levels are high. ? You are older than 82 years of age. ? You are at high risk for heart disease.  Cancer screening Lung Cancer  Lung cancer screening is recommended for adults 55-80 years old who are at high risk for lung cancer because of a history of smoking.  A yearly low-dose CT scan of the lungs is recommended for people who: ? Currently smoke. ? Have quit within the past 15 years. ? Have at least a 30-pack-year history of smoking. A pack year is smoking an average of one pack of cigarettes a day for 1 year.  Yearly screening should continue until it has been 15 years since you quit.  Yearly screening should stop if you develop a health problem that would prevent you from having lung cancer treatment.  Breast Cancer  Practice breast self-awareness. This means understanding how your breasts normally appear and feel.  It also means doing regular breast self-exams. Let your health care provider know about any changes, no matter how small.  If you are in your 20s or 30s, you should have a clinical breast exam (CBE) by a health care provider every 1-3 years as part of a regular health exam.  If you are 40 or older, have a CBE every year. Also consider having a breast X-ray (mammogram) every year.  If you have a family history of breast cancer, talk to your health care provider about genetic   screening.  If you are at high risk for breast cancer, talk to your health care provider about having an MRI and a mammogram every year.  Breast cancer gene (BRCA) assessment is recommended for women who have family members with BRCA-related cancers. BRCA-related cancers include: ? Breast. ? Ovarian. ? Tubal. ? Peritoneal cancers.  Results of the assessment will determine the need for genetic counseling and BRCA1 and BRCA2 testing.  Cervical Cancer Your health care provider may recommend that you be screened regularly for cancer of the  pelvic organs (ovaries, uterus, and vagina). This screening involves a pelvic examination, including checking for microscopic changes to the surface of your cervix (Pap test). You may be encouraged to have this screening done every 3 years, beginning at age 21.  For women ages 30-65, health care providers may recommend pelvic exams and Pap testing every 3 years, or they may recommend the Pap and pelvic exam, combined with testing for human papilloma virus (HPV), every 5 years. Some types of HPV increase your risk of cervical cancer. Testing for HPV may also be done on women of any age with unclear Pap test results.  Other health care providers may not recommend any screening for nonpregnant women who are considered low risk for pelvic cancer and who do not have symptoms. Ask your health care provider if a screening pelvic exam is right for you.  If you have had past treatment for cervical cancer or a condition that could lead to cancer, you need Pap tests and screening for cancer for at least 20 years after your treatment. If Pap tests have been discontinued, your risk factors (such as having a new sexual partner) need to be reassessed to determine if screening should resume. Some women have medical problems that increase the chance of getting cervical cancer. In these cases, your health care provider may recommend more frequent screening and Pap tests.  Colorectal Cancer  This type of cancer can be detected and often prevented.  Routine colorectal cancer screening usually begins at 82 years of age and continues through 82 years of age.  Your health care provider may recommend screening at an earlier age if you have risk factors for colon cancer.  Your health care provider may also recommend using home test kits to check for hidden blood in the stool.  A small camera at the end of a tube can be used to examine your colon directly (sigmoidoscopy or colonoscopy). This is done to check for the  earliest forms of colorectal cancer.  Routine screening usually begins at age 50.  Direct examination of the colon should be repeated every 5-10 years through 82 years of age. However, you may need to be screened more often if early forms of precancerous polyps or small growths are found.  Skin Cancer  Check your skin from head to toe regularly.  Tell your health care provider about any new moles or changes in moles, especially if there is a change in a mole's shape or color.  Also tell your health care provider if you have a mole that is larger than the size of a pencil eraser.  Always use sunscreen. Apply sunscreen liberally and repeatedly throughout the day.  Protect yourself by wearing long sleeves, pants, a wide-brimmed hat, and sunglasses whenever you are outside.  Heart disease, diabetes, and high blood pressure  High blood pressure causes heart disease and increases the risk of stroke. High blood pressure is more likely to develop in: ? People who   have blood pressure in the high end of the normal range (130-139/85-89 mm Hg). ? People who are overweight or obese. ? People who are African American.  If you are 19-29 years of age, have your blood pressure checked every 3-5 years. If you are 30 years of age or older, have your blood pressure checked every year. You should have your blood pressure measured twice-once when you are at a hospital or clinic, and once when you are not at a hospital or clinic. Record the average of the two measurements. To check your blood pressure when you are not at a hospital or clinic, you can use: ? An automated blood pressure machine at a pharmacy. ? A home blood pressure monitor.  If you are between 65 years and 63 years old, ask your health care provider if you should take aspirin to prevent strokes.  Have regular diabetes screenings. This involves taking a blood sample to check your fasting blood sugar level. ? If you are at a normal weight and  have a low risk for diabetes, have this test once every three years after 82 years of age. ? If you are overweight and have a high risk for diabetes, consider being tested at a younger age or more often. Preventing infection Hepatitis B  If you have a higher risk for hepatitis B, you should be screened for this virus. You are considered at high risk for hepatitis B if: ? You were born in a country where hepatitis B is common. Ask your health care provider which countries are considered high risk. ? Your parents were born in a high-risk country, and you have not been immunized against hepatitis B (hepatitis B vaccine). ? You have HIV or AIDS. ? You use needles to inject street drugs. ? You live with someone who has hepatitis B. ? You have had sex with someone who has hepatitis B. ? You get hemodialysis treatment. ? You take certain medicines for conditions, including cancer, organ transplantation, and autoimmune conditions.  Hepatitis C  Blood testing is recommended for: ? Everyone born from [birth year] through 1963. ? Anyone with known risk factors for hepatitis C.  Sexually transmitted infections (STIs)  You should be screened for sexually transmitted infections (STIs) including gonorrhea and chlamydia if: ? You are sexually active and are younger than 82 years of age. ? You are older than 83 years of age and your health care provider tells you that you are at risk for this type of infection. ? Your sexual activity has changed since you were last screened and you are at an increased risk for chlamydia or gonorrhea. Ask your health care provider if you are at risk.  If you do not have HIV, but are at risk, it may be recommended that you take a prescription medicine daily to prevent HIV infection. This is called pre-exposure prophylaxis (PrEP). You are considered at risk if: ? You are sexually active and do not regularly use condoms or know the HIV status of your partner(s). ? You take drugs by  injection. ? You are sexually active with a partner who has HIV.  Talk with your health care provider about whether you are at high risk of being infected with HIV. If you choose to begin PrEP, you should first be tested for HIV. You should then be tested every 3 months for as long as you are taking PrEP. Pregnancy  If you are premenopausal and you may become pregnant, ask your health care provider  about preconception counseling.  If you may become pregnant, take 400 to 800 micrograms (mcg) of folic acid every day.  If you want to prevent pregnancy, talk to your health care provider about birth control (contraception). Osteoporosis and menopause  Osteoporosis is a disease in which the bones lose minerals and strength with aging. This can result in serious bone fractures. Your risk for osteoporosis can be identified using a bone density scan.  If you are 6 years of age or older, or if you are at risk for osteoporosis and fractures, ask your health care provider if you should be screened.  Ask your health care provider whether you should take a calcium or vitamin D supplement to lower your risk for osteoporosis.  Menopause may have certain physical symptoms and risks.  Hormone replacement therapy may reduce some of these symptoms and risks. Talk to your health care provider about whether hormone replacement therapy is right for you. Follow these instructions at home:  Schedule regular health, dental, and eye exams.  Stay current with your immunizations.  Do not use any tobacco products including cigarettes, chewing tobacco, or electronic cigarettes.  If you are pregnant, do not drink alcohol.  If you are breastfeeding, limit how much and how often you drink alcohol.  Limit alcohol intake to no more than 1 drink per day for nonpregnant women. One drink equals 12 ounces of beer, 5 ounces of wine, or 1 ounces of hard liquor.  Do not use street drugs.  Do not share needles.  Ask  your health care provider for help if you need support or information about quitting drugs.  Tell your health care provider if you often feel depressed.  Tell your health care provider if you have ever been abused or do not feel safe at home. This information is not intended to replace advice given to you by your health care provider. Make sure you discuss any questions you have with your health care provider. Document Released: 08/29/2010 Document Revised: 07/22/2015 Document Reviewed: 11/17/2014 Elsevier Interactive Patient Education  [employer].

## 2017-06-06 NOTE — Assessment & Plan Note (Signed)
Checking CMP. BP at goal today although has been volatile lately. Not exercising and counseled about how this can reduce volatility of BP.

## 2017-06-06 NOTE — Assessment & Plan Note (Signed)
Checking CMP for stability. BP at goal. No diabetes.

## 2017-06-06 NOTE — Assessment & Plan Note (Signed)
Checking lipid panel and adjust simvastatin as needed.  

## 2017-07-15 ENCOUNTER — Other Ambulatory Visit: Payer: Self-pay | Admitting: Internal Medicine

## 2017-07-20 ENCOUNTER — Ambulatory Visit (INDEPENDENT_AMBULATORY_CARE_PROVIDER_SITE_OTHER): Payer: Medicare Other | Admitting: Pulmonary Disease

## 2017-07-20 ENCOUNTER — Encounter: Payer: Self-pay | Admitting: Pulmonary Disease

## 2017-07-20 VITALS — BP 132/88 | HR 64 | Ht 59.0 in | Wt 188.0 lb

## 2017-07-20 DIAGNOSIS — G4733 Obstructive sleep apnea (adult) (pediatric): Secondary | ICD-10-CM | POA: Diagnosis not present

## 2017-07-20 DIAGNOSIS — Z9989 Dependence on other enabling machines and devices: Secondary | ICD-10-CM

## 2017-07-20 DIAGNOSIS — J310 Chronic rhinitis: Secondary | ICD-10-CM

## 2017-07-20 NOTE — Progress Notes (Signed)
[facility]  Chief Complaint  Patient presents with  . Follow-up    f/u OSA. 5-7 hours each night,     Vital signs: BP 132/88 (BP Location: Left Arm, Cuff Size: Normal)   Pulse 64   Ht 4\' 11"  (1.499 m)   Wt 188 lb (85.3 kg)   SpO2 98%   BMI 37.97 kg/m   History of Present Illness: April Donaldson is a 84 y.o. female with obstructive sleep apnea.  She uses CPAP nightly.  Has nasal mask.  Gets mouth dryness.  Has chin strap but is old and worn out.  She gets sinus congestion and cough sometimes in the morning.  Also gets dry mouth sometimes after using CPAP.  Not bringing up sputum or having wheeze.     Physical Exam:  General - pleasant Eyes - pupils reactive ENT - no sinus tenderness, no oral exudate, no LAN, MP 4, scalloped tongue Cardiac - regular, no murmur Chest - no wheeze, rales Abd - soft, non tender Ext - no edema Skin - no rashes Neuro - normal strength Psych - normal mood  Assessment/Plan:  Obstructive sleep apnea. - she is compliant with CPAP and reports benefit - continue auto CPAP - has mouth dryness >> will set her up for new chin strap; if no better then adjust pressure setting and/or new mask (her preference is to keep nasal mask)  CPAP rhinitis. - she can try using flonase prn and adjust humidifier on CPAP    Patient Instructions  Will arrange for chin strap to use with your CPAP mask  Can try using flonase 1 spray each nostril as needed when you get sinus congestion from your CPAP machine  Follow up in 1 year    Chesley Mires, MD [place] 07/20/2017, 10:59 AM  Flow Sheet  Sleep tests: PSG 10/14/07 >> AHI 20 Auto CPAP 06/20/17 to 07/19/17 >> used on 25 of 30 nights with average 5 hrs 57 min.  Average AHI 0.1 with median CPAP 10 and 95 th percentile CPAP 12 cm H2O  Past Medical History: She  has a past medical history of Anxiety, Benign neoplasm of kidney, except pelvis,  Cellulitis of leg (03/09/2013 hosp ), Cerebrovascular disease, Chronic low back pain, Difficult intubation, DJD (degenerative joint disease), Dyspnea, GERD (gastroesophageal reflux disease), Glaucoma, Gout, H/O hiatal hernia, Hepatitis, colonic polyps, Hypercholesteremia, Hypertension, Hypothyroid, IBS (irritable bowel syndrome), Obesity, OSA (obstructive sleep apnea), Renal insufficiency, Tubular adenoma (2000), and Venous insufficiency.  Past Surgical History: She  has a past surgical history that includes hysterctomy and ooperectomy (1970's); thyroid lobectomy for a cyst (10/2000); Functional endoscopic sinus surgery (05/2003); Colonoscopy; Tonsillectomy; Appendectomy; Dilation and curettage of uterus; Mass excision (2003); Mass excision (Left, 09/17/2013); cataracts (Bilateral); Abdominal hysterectomy; and Cystoscopy/retrograde/ureteroscopy (Bilateral, 04/09/2017).  Family History: Her family history includes Asthma in her brother and sister; Brain cancer (age of onset: 52) in her sister; Breast cancer (age of onset: 23) in her sister; Heart disease in her mother; Throat cancer (age of onset: 92) in her brother.  Social History: She  reports that she has never smoked. She has never used smokeless tobacco. She reports that she does not drink alcohol or use drugs.  Medications: Allergies as of 07/20/2017      Reactions   Amlodipine Besylate Swelling, Other (See Comments)   Causes swelling   Clonidine Nausea Only, Other (See Comments)   Sweating, felt faint   Diltiazem Hcl Swelling, Other (See Comments)  meds did not work for her----causes swelling   Klonopin [clonazepam] Other (See Comments)   Vivid dreams   Tetracycline Swelling   Doxycycline Hyclate Other (See Comments)   REACTION:  tetracycline   Hydralazine Rash   Iodine Other (See Comments)   IVP contrast Pt. States topical iodine is fine to use without any irritation. H.Mickley RN   Labetalol Hcl Other (See Comments)   Unknown    Lidocaine Rash   Olmesartan Medoxomil Other (See Comments)   Unknown      Medication List        Accurate as of 07/20/17 10:59 AM. Always use your most recent med list.          allopurinol 100 MG tablet Commonly known as:  ZYLOPRIM TAKE 1 TABLET (100 MG TOTAL) BY MOUTH DAILY.   aspirin 81 MG tablet Take 81 mg by mouth daily.   atenolol 50 MG tablet Commonly known as:  TENORMIN TAKE 2 TABLETS DAILY   dorzolamide-timolol 22.3-6.8 MG/ML ophthalmic solution Commonly known as:  COSOPT Place 1 drop into both eyes 2 (two) times daily.   latanoprost 0.005 % ophthalmic solution Commonly known as:  XALATAN Place 1 drop into both eyes at bedtime.   levothyroxine 50 MCG tablet Commonly known as:  SYNTHROID, LEVOTHROID TAKE 1 TABLET BY MOUTH EVERY DAY BEFORE BREAKFAST, NEEDS OFFICE VISIT FOR REFILLS   lisinopril 5 MG tablet Commonly known as:  PRINIVIL,ZESTRIL Take 5 mg by mouth 2 (two) times daily.   simvastatin 20 MG tablet Commonly known as:  ZOCOR TAKE 1 TABLET DAILY AT 6 P.M.   triamterene-hydrochlorothiazide 37.5-25 MG capsule Commonly known as:  DYAZIDE TAKE ONE CAPSULE BY MOUTH ONCE DAILY

## 2017-07-20 NOTE — Patient Instructions (Signed)
Will arrange for chin strap to use with your CPAP mask  Can try using flonase 1 spray each nostril as needed when you get sinus congestion from your CPAP machine  Follow up in 1 year

## 2017-07-25 ENCOUNTER — Encounter: Payer: Self-pay | Admitting: Internal Medicine

## 2017-07-25 LAB — HM DEXA SCAN: HM DEXA SCAN: -0.8

## 2017-07-25 NOTE — Progress Notes (Signed)
Abstracted and sent to scan  

## 2017-08-15 ENCOUNTER — Other Ambulatory Visit: Payer: Self-pay | Admitting: Internal Medicine

## 2017-10-08 ENCOUNTER — Telehealth: Payer: Self-pay | Admitting: Pulmonary Disease

## 2017-10-08 NOTE — Telephone Encounter (Signed)
Called and spoke with Ailene Ravel, [facility] about chin strap order that was placed 07/20/17.  Ailene Ravel stated that she could see the order and it was overlooked.  She stated that she would contact the Patient and get her a chin strap.  Called and spoke with the Patient to let her know that [facility] would be contacting her and if she had any problems or questions, to please let us know. Patient stated understanding.  Nothing further at this time.

## 2017-10-18 ENCOUNTER — Ambulatory Visit (INDEPENDENT_AMBULATORY_CARE_PROVIDER_SITE_OTHER): Payer: Medicare Other | Admitting: Internal Medicine

## 2017-10-18 ENCOUNTER — Encounter: Payer: Self-pay | Admitting: Internal Medicine

## 2017-10-18 ENCOUNTER — Ambulatory Visit (INDEPENDENT_AMBULATORY_CARE_PROVIDER_SITE_OTHER)
Admission: RE | Admit: 2017-10-18 | Discharge: 2017-10-18 | Disposition: A | Discharge status: Home / Self Care | Payer: Medicare Other | Source: Ambulatory Visit | Attending: Internal Medicine | Admitting: Internal Medicine

## 2017-10-18 VITALS — BP 132/80 | HR 59 | Temp 97.5°F | Ht 59.0 in | Wt 188.0 lb

## 2017-10-18 DIAGNOSIS — K5900 Constipation, unspecified: Secondary | ICD-10-CM | POA: Diagnosis not present

## 2017-10-18 NOTE — Patient Instructions (Signed)
We will check the x-ray of the stomach today to check for blockage.   Likely you are still constipated and we will have you take the senokot to help clear the intestines.

## 2017-10-18 NOTE — Progress Notes (Signed)
   Subjective:    Patient ID: April Donaldson, female    DOB: 05-Jul-1931, 84 y.o.   MRN: [id number]  HPI The patient is a 84 YO female coming in for poor appetite and stomach issues. She had constipation for about 1 week and then starting having BM but not normal. Still is still having mostly small hard BM with rare loose stool. Did not take anything for the constipation just started drinking more water. Denies fevers or chills. Denies nausea or vomiting. Poor appetite and full after a few bites. Has not been eating well in the last week. Feels like stomach is bloating. Denies blood in stool. Negative cologuard 2016.   Review of Systems  Constitutional: Positive for appetite change.  HENT: Negative.   Eyes: Negative.   Respiratory: Negative for cough, chest tightness and shortness of breath.   Cardiovascular: Negative for chest pain, palpitations and leg swelling.  Gastrointestinal: Positive for abdominal distention and constipation. Negative for abdominal pain, diarrhea, nausea and vomiting.  Musculoskeletal: Negative.   Skin: Negative.   Neurological: Negative.   Psychiatric/Behavioral: Negative.       Objective:   Physical Exam  Constitutional: She is oriented to person, place, and time. She appears well-developed and well-nourished.  HENT:  Head: Normocephalic and atraumatic.  Eyes: EOM are normal.  Neck: Normal range of motion.  Cardiovascular: Normal rate and regular rhythm.  Pulmonary/Chest: Effort normal and breath sounds normal. No respiratory distress. She has no wheezes. She has no rales.  Abdominal: Soft. Bowel sounds are normal. She exhibits distension. There is no tenderness. There is no rebound.  Musculoskeletal: She exhibits no edema.  Neurological: She is alert and oriented to person, place, and time. Coordination normal.  Skin: Skin is warm and dry.   Vitals:   10/18/17 1007  BP: 132/80  Pulse: (!) 59  Temp: (!) 97.5 F (36.4 C)  TempSrc: Oral  SpO2: 96%    Weight: 188 lb (85.3 kg)  Height: 4\' 11"  (1.499 m)      Assessment & Plan:

## 2017-10-18 NOTE — Assessment & Plan Note (Signed)
Checking x-ray abdomen today to rule out obstruction. We have talked about senokot for constipation to help with likely high stool burden. No indication for labs today.

## 2017-11-05 ENCOUNTER — Other Ambulatory Visit: Payer: Self-pay | Admitting: Internal Medicine

## 2017-11-20 ENCOUNTER — Ambulatory Visit (INDEPENDENT_AMBULATORY_CARE_PROVIDER_SITE_OTHER): Payer: Medicare Other

## 2017-11-20 DIAGNOSIS — Z23 Encounter for immunization: Secondary | ICD-10-CM

## 2017-12-06 ENCOUNTER — Ambulatory Visit (INDEPENDENT_AMBULATORY_CARE_PROVIDER_SITE_OTHER): Payer: Medicare Other | Admitting: Internal Medicine

## 2017-12-06 ENCOUNTER — Encounter: Payer: Self-pay | Admitting: Internal Medicine

## 2017-12-06 DIAGNOSIS — K5900 Constipation, unspecified: Secondary | ICD-10-CM

## 2017-12-06 DIAGNOSIS — E039 Hypothyroidism, unspecified: Secondary | ICD-10-CM

## 2017-12-06 MED ORDER — LEVOTHYROXINE SODIUM 50 MCG PO TABS: 50.0000 ug | ORAL_TABLET | Freq: Every day | ORAL | 3 refills | Status: DC

## 2017-12-06 NOTE — Progress Notes (Signed)
   Subjective:    Patient ID: April Donaldson, female    DOB: April 26, 1931, 84 y.o.   MRN: [id number]  HPI The patient is an 84 YO female coming in for follow up of her thyroid (feeling well overall, denies fatigue or weight changes, denies heat or cold intolerance, denies side effects from synthroid, takes separate from other meds) and her constipation (about 2 months ago had a streak of constipation, denies current problems with this, overall happy with bowels, denies blood in stool).   Review of Systems  Constitutional: Negative.   HENT: Negative.   Eyes: Negative.   Respiratory: Negative for cough, chest tightness and shortness of breath.   Cardiovascular: Negative for chest pain, palpitations and leg swelling.  Gastrointestinal: Negative for abdominal distention, abdominal pain, constipation, diarrhea, nausea and vomiting.  Musculoskeletal: Negative.   Skin: Negative.   Neurological: Negative.   Psychiatric/Behavioral: Negative.       Objective:   Physical Exam  Constitutional: She is oriented to person, place, and time. She appears well-developed and well-nourished.  HENT:  Head: Normocephalic and atraumatic.  Eyes: EOM are normal.  Neck: Normal range of motion.  Cardiovascular: Normal rate and regular rhythm.  Pulmonary/Chest: Effort normal and breath sounds normal. No respiratory distress. She has no wheezes. She has no rales.  Abdominal: Soft. Bowel sounds are normal. She exhibits no distension. There is no tenderness. There is no rebound.  Musculoskeletal: She exhibits no edema.  Neurological: She is alert and oriented to person, place, and time. Coordination normal.  Skin: Skin is warm and dry.  Psychiatric: She has a normal mood and affect.   Vitals:   12/06/17 1102  BP: 122/80  Pulse: (!) 56  Temp: (!) 97.5 F (36.4 C)  TempSrc: Oral  SpO2: 97%  Weight: 189 lb (85.7 kg)  Height: 4\' 11"  (1.499 m)      Assessment & Plan:

## 2017-12-06 NOTE — Patient Instructions (Signed)
There is a medicine over the counter called ibguard that might help with your symptoms.

## 2017-12-07 NOTE — Assessment & Plan Note (Signed)
Resolved at this time. Colonoscopy aged out but has had without problems in the past. Will resolve at this time.

## 2017-12-07 NOTE — Assessment & Plan Note (Signed)
Refill synthroid and recent levels normal. No indication for change today.

## 2018-01-12 ENCOUNTER — Other Ambulatory Visit: Payer: Self-pay | Admitting: Internal Medicine

## 2018-02-07 ENCOUNTER — Encounter: Payer: Self-pay | Admitting: Internal Medicine

## 2018-02-07 ENCOUNTER — Ambulatory Visit (INDEPENDENT_AMBULATORY_CARE_PROVIDER_SITE_OTHER): Payer: Medicare Other | Admitting: Internal Medicine

## 2018-02-07 ENCOUNTER — Other Ambulatory Visit (INDEPENDENT_AMBULATORY_CARE_PROVIDER_SITE_OTHER): Payer: Medicare Other

## 2018-02-07 VITALS — BP 120/88 | HR 62 | Temp 98.0°F | Ht 59.0 in | Wt 190.0 lb

## 2018-02-07 DIAGNOSIS — E039 Hypothyroidism, unspecified: Secondary | ICD-10-CM

## 2018-02-07 DIAGNOSIS — M1A00X0 Idiopathic chronic gout, unspecified site, without tophus (tophi): Secondary | ICD-10-CM

## 2018-02-07 DIAGNOSIS — R0602 Shortness of breath: Secondary | ICD-10-CM

## 2018-02-07 DIAGNOSIS — E538 Deficiency of other specified B group vitamins: Secondary | ICD-10-CM

## 2018-02-07 DIAGNOSIS — R5383 Other fatigue: Secondary | ICD-10-CM | POA: Diagnosis not present

## 2018-02-07 DIAGNOSIS — I10 Essential (primary) hypertension: Secondary | ICD-10-CM

## 2018-02-07 LAB — COMPREHENSIVE METABOLIC PANEL
ALT: 19 U/L (ref 0–35)
AST: 20 U/L (ref 0–37)
Albumin: 4.2 g/dL (ref 3.5–5.2)
Alkaline Phosphatase: 65 U/L (ref 39–117)
BUN: 17 mg/dL (ref 6–23)
CO2: 28 mEq/L (ref 19–32)
Calcium: 9.5 mg/dL (ref 8.4–10.5)
Chloride: 104 mEq/L (ref 96–112)
Creatinine, Ser: 1.19 mg/dL (ref 0.40–1.20)
GFR: 55.57 mL/min — ABNORMAL LOW (ref >60.00)
Glucose, Bld: 118 mg/dL — ABNORMAL HIGH (ref 70–99)
Potassium: 3.6 mEq/L (ref 3.5–5.1)
Sodium: 141 mEq/L (ref 135–145)
Total Bilirubin: 0.5 mg/dL (ref 0.2–1.2)
Total Protein: 7.4 g/dL (ref 6.0–8.3)

## 2018-02-07 LAB — CBC
HEMATOCRIT: 41.2 % (ref 36.0–46.0)
HEMOGLOBIN: 13.9 g/dL (ref 12.0–15.0)
MCHC: 33.6 g/dL (ref 30.0–36.0)
MCV: 89.6 fl (ref 78.0–100.0)
Platelets: 298 10*3/uL (ref 150.0–400.0)
RBC: 4.6 Mil/uL (ref 3.87–5.11)
RDW: 13.8 % (ref 11.5–15.5)
WBC: 8.9 10*3/uL (ref 4.0–10.5)

## 2018-02-07 LAB — VITAMIN D 25 HYDROXY (VIT D DEFICIENCY, FRACTURES): VITD: 39.78 ng/mL (ref 30.00–100.00)

## 2018-02-07 LAB — T4, FREE: FREE T4: 0.77 ng/dL (ref 0.60–1.60)

## 2018-02-07 LAB — URIC ACID: Uric Acid, Serum: 6.7 mg/dL (ref 2.4–7.0)

## 2018-02-07 LAB — BRAIN NATRIURETIC PEPTIDE: PRO B NATRI PEPTIDE: 79 pg/mL (ref 0.0–100.0)

## 2018-02-07 LAB — TSH: TSH: 3.84 u[IU]/mL (ref 0.35–4.50)

## 2018-02-07 LAB — VITAMIN B12: VITAMIN B 12: 868 pg/mL (ref 211–911)

## 2018-02-07 NOTE — Patient Instructions (Signed)
We will check the labs today to look for any new changes.

## 2018-02-07 NOTE — Progress Notes (Signed)
   Subjective:    Patient ID: April Donaldson, female    DOB: 10-03-1931, 84 y.o.   MRN: [id number]  HPI The patient is an 84 YO female coming in for concerns about feeling poorly. She cannot describe exactly. She is feeling some more fatigued overall. Appetite is slightly down. Denies chest pains, SOB, cough, abdominal pain, nausea, constipation, diarrhea. Denies rash. Denies fevers or chills. Overall weight stable. Some rare headaches which are not changed recently.   Review of Systems  Constitutional: Positive for fatigue. Negative for activity change and appetite change.  HENT: Negative.   Eyes: Negative.   Respiratory: Negative for cough, chest tightness and shortness of breath.   Cardiovascular: Negative for chest pain, palpitations and leg swelling.  Gastrointestinal: Negative for abdominal distention, abdominal pain, constipation, diarrhea, nausea and vomiting.  Musculoskeletal: Negative.   Skin: Negative.   Neurological: Positive for headaches.  Psychiatric/Behavioral: Negative.       Objective:   Physical Exam Constitutional:      Appearance: She is well-developed.  HENT:     Head: Normocephalic and atraumatic.  Neck:     Musculoskeletal: Normal range of motion.  Cardiovascular:     Rate and Rhythm: Normal rate and regular rhythm.  Pulmonary:     Effort: Pulmonary effort is normal. No respiratory distress.     Breath sounds: Normal breath sounds. No wheezing or rales.  Abdominal:     General: Bowel sounds are normal. There is no distension.     Palpations: Abdomen is soft.     Tenderness: There is no abdominal tenderness. There is no rebound.  Skin:    General: Skin is warm and dry.  Neurological:     Mental Status: She is alert and oriented to person, place, and time.     Coordination: Coordination normal.    Vitals:   02/07/18 1001  BP: 120/88  Pulse: 62  Temp: 98 F (36.7 C)  TempSrc: Oral  SpO2: 99%  Weight: 190 lb (86.2 kg)  Height: 4\' 11"  (1.499 m)        Assessment & Plan:

## 2018-02-08 DIAGNOSIS — R5383 Other fatigue: Secondary | ICD-10-CM | POA: Insufficient documentation

## 2018-02-08 NOTE — Assessment & Plan Note (Signed)
BP at goal, continue current regimen. Checking CMP.

## 2018-02-08 NOTE — Assessment & Plan Note (Signed)
Checking thyroid, CBC, CMP, BNP, vitamin D and B12. Adjust as needed.

## 2018-02-08 NOTE — Assessment & Plan Note (Signed)
Checking TSH and free T4. Adjust synthroid 50 mcg daily as needed.

## 2018-02-08 NOTE — Assessment & Plan Note (Signed)
Checking uric acid.

## 2018-02-10 ENCOUNTER — Other Ambulatory Visit: Payer: Self-pay | Admitting: Internal Medicine

## 2018-03-04 NOTE — Progress Notes (Signed)
Subjective:   April Donaldson is a 85 y.o. female who presents for Medicare Annual (Subsequent) preventive examination.  Review of Systems:  No ROS.  Medicare Wellness Visit. Additional risk factors are reflected in the social history.  Cardiac Risk Factors include: obesity (BMI >30kg/m2);hypertension;dyslipidemia;advanced age (>21men, >84 women);sedentary lifestyle Sleep patterns: Pt. states she feels like she gets adequate sleep, although irregular, uses CPAP. Pt. Denies any issues using CPAP. Sleep hygiene discussed with pt.  Home Safety/Smoke Alarms: Feels safe in home. Smoke alarms in place.  Living environment; residence and Firearm Safety: equipment: Miscellaneous Adaptive, Type: grab extender, tub bench. Seat Belt Safety/Bike Helmet: Wears seat belt. Lives alone, no needs for DME, good support system      Objective:     Vitals: BP (!) 148/88 (BP Location: Left Arm, Patient Position: Sitting, Cuff Size: Large) Comment: pt states she has not taken her BP meds yet today  Pulse (!) 55   Ht 4\' 11"  (1.499 m)   Wt 190 lb (86.2 kg)   SpO2 98%   BMI 38.38 kg/m   Body mass index is 38.38 kg/m.  Advanced Directives 03/05/2018 05/16/2017 04/09/2017 02/15/2017 06/22/2016 05/19/2016 04/04/2016  Does Patient Have a Medical Advance Directive? Yes No Yes Yes No Yes No  Type of Paramedic of [place];Living will - Healthcare Power of [place];Living will - [place] -  Does patient want to make changes to medical advance directive? - - - - - No - Patient declined -  Copy of [place] in Chart? No - copy requested - No - copy requested No - copy requested - - -  Would patient like information on creating a medical advance directive? - Yes (ED - Information included in AVS) - - - - -  Pre-existing out of facility DNR order (yellow form or pink MOST form) - - - - - - -    Tobacco Social History   Tobacco  Use  Smoking Status Never Smoker  Smokeless Tobacco Never Used     Counseling given: Not Answered   Past Medical History:  Diagnosis Date  . Anxiety   . Benign neoplasm of kidney, except pelvis   . Cellulitis of leg 03/09/2013 hosp   . Cerebrovascular disease   . Chronic low back pain   . Difficult intubation    per patient with hand surgery in 2016 at surgical center- lip was pinched and swollen after surgery- patient states dr Burney Gauze never stated a problem with intubation   . DJD (degenerative joint disease)   . Dyspnea    with exertion   . GERD (gastroesophageal reflux disease)    no meds  . Glaucoma   . Gout   . H/O hiatal hernia   . Hepatitis    hx of years ago   . Hx of colonic polyps   . Hypercholesteremia   . Hypertension   . Hypothyroid   . IBS (irritable bowel syndrome)   . Obesity   . OSA (obstructive sleep apnea)    NPSG 2009: AHI 20/h. CPAP - follows with [facility]  . Renal insufficiency    one kidney small and non funtioning  . Tubular adenoma 2000  . Venous insufficiency    Past Surgical History:  Procedure Laterality Date  . ABDOMINAL HYSTERECTOMY    . APPENDECTOMY    . CATARACT EXTRACTION, BILATERAL Bilateral 2017  . cataracts Bilateral   . COLONOSCOPY    .  CYSTOSCOPY/RETROGRADE/URETEROSCOPY Bilateral 04/09/2017   Procedure: CYSTOSCOPY/RETROGRADE;  Surgeon: Raynelle Bring, MD;  Location: [facility];  Service: Urology;  Laterality: Bilateral;  ONLY NEEDS 30 MIN FOR PROCEDURE  . DILATION AND CURETTAGE OF UTERUS    . FUNCTIONAL ENDOSCOPIC SINUS SURGERY  05/2003   Dr. Ernesto Rutherford  . hysterctomy and ooperectomy  1970's  . MASS EXCISION  2003   lt  . MASS EXCISION Left 09/17/2013   Procedure: EXCISION MASS LEFT HAND;  Surgeon: Schuyler Amor, MD;  Location: [facility];  Service: Orthopedics;  Laterality: Left;  . thyroid lobectomy for a cyst  10/2000   Dr. Ernesto Rutherford  . TONSILLECTOMY     Family History  Problem Relation Age of Onset  .  Heart disease Mother   . Asthma Brother   . Throat cancer Brother 48  . Asthma Sister   . Brain cancer Sister 55  . Breast cancer Sister 66   Social History   Socioeconomic History  . Marital status: Single    Spouse name: Not on file  . Number of children: 1  . Years of education: Not on file  . Highest education level: Not on file  Occupational History  . Occupation: retired from worked in Engineer, manufacturing systems: RETIRED  Social Needs  . Financial resource strain: Not very hard  . Food insecurity:    Worry: Never true    Inability: Never true  . Transportation needs:    Medical: No    Non-medical: No  Tobacco Use  . Smoking status: Never Smoker  . Smokeless tobacco: Never Used  Substance and Sexual Activity  . Alcohol use: No    Alcohol/week: 0.0 standard drinks  . Drug use: No  . Sexual activity: Never  Lifestyle  . Physical activity:    Days per week: 0 days    Minutes per session: 0 min  . Stress: Not at all  Relationships  . Social connections:    Talks on phone: More than three times a week    Gets together: More than three times a week    Attends religious service: More than 4 times per year    Active member of club or organization: Not on file    Attends meetings of clubs or organizations: More than 4 times per year    Relationship status: Not on file  Other Topics Concern  . Not on file  Social History Narrative   Lives alone, indep - never married   g-dtr in [place], siblings, nieces in [place] in touch with a few friends who talk regularly    Outpatient Encounter Medications as of 03/05/2018  Medication Sig  . allopurinol (ZYLOPRIM) 100 MG tablet TAKE 1 TABLET (100 MG TOTAL) BY MOUTH DAILY.  Marland Kitchen aspirin 81 MG tablet Take 81 mg by mouth daily.    Marland Kitchen atenolol (TENORMIN) 50 MG tablet TAKE 2 TABLETS DAILY  . dorzolamide-timolol (COSOPT) 22.3-6.8 MG/ML ophthalmic solution Place 1 drop into both eyes 2 (two) times daily.  Marland Kitchen latanoprost (XALATAN)  0.005 % ophthalmic solution Place 1 drop into both eyes at bedtime.   Marland Kitchen levothyroxine (SYNTHROID, LEVOTHROID) 50 MCG tablet Take 1 tablet (50 mcg total) by mouth daily before breakfast.  . lisinopril (PRINIVIL,ZESTRIL) 5 MG tablet Take 5 mg by mouth 2 (two) times daily.   . simvastatin (ZOCOR) 20 MG tablet TAKE 1 TABLET DAILY AT 6 P.M.  . triamterene-hydrochlorothiazide (DYAZIDE) 37.5-25 MG capsule TAKE ONE CAPSULE BY MOUTH  ONCE DAILY   No facility-administered encounter medications on file as of 03/05/2018.     Activities of Daily Living In your present state of health, do you have any difficulty performing the following activities: 03/05/2018  Hearing? N  Vision? N  Difficulty concentrating or making decisions? N  Walking or climbing stairs? N  Dressing or bathing? N  Doing errands, shopping? N  Preparing Food and eating ? N  Using the Toilet? N  In the past six months, have you accidently leaked urine? N  Do you have problems with loss of bowel control? N  Managing your Medications? N  Managing your Finances? N  Housekeeping or managing your Housekeeping? N  Some recent data might be hidden    Patient Care Team: Hoyt Koch, MD as PCP - General (Internal Medicine) Clance, Armando Reichert, MD (Sleep Medicine) Josue Hector, MD (Cardiology) Gatha Mayer, MD (Gastroenterology) Edrick Oh, MD (Nephrology) Raynelle Bring, MD (Urology) Thornell Sartorius, MD (Otolaryngology) Rutherford Guys, MD (Ophthalmology) Avon Gully, NP (Obstetrics and Gynecology) Edilia Bo, Tracie Harrier, MD (Ophthalmology) Charlotte Crumb, MD (Orthopedic Surgery)    Assessment:   This is a routine wellness examination for [facility]. Physical assessment deferred to PCP.   Exercise Activities and Dietary recommendations   Diet (meal preparation, eat out, water intake, caffeinated beverages, dairy products, fruits and vegetables): in general, a "healthy" diet  .  Reviewed heart healthy diet, weight  loss.     Current Exercise Habits: The patient does not participate in regular exercise at present, Exercise limited by: cardiac condition(s);respiratory conditions(s)  Goals    . patient (pt-stated)     To keep working on staying on healthy; by learning about good food choices and exercise that works for you.    . Patient Stated     Lose weight,  monitor diet closer and continue to exercise. Enjoy life and family.    . Patient Stated     "Maintain what I'm doing. Will think about diet plan to lose weight. I do want to get some housework projects completed".       Fall Risk Fall Risk  03/05/2018 02/15/2017 11/21/2016 05/07/2015 04/30/2014  Falls in the past year? 0 No No No No   Depression Screen PHQ 2/9 Scores 03/05/2018 02/15/2017 11/21/2016 05/07/2015  PHQ - 2 Score 0 0 0 0  PHQ- 9 Score - 0 - -     Cognitive Function   MMSE - Mini Mental State Exam 02/15/2017 05/07/2015  Not completed: - (No Data)  Orientation to time 5 -  Orientation to Place 5 -  Registration 3 -  Attention/ Calculation 5 -  Recall 2 -  Language- name 2 objects 2 -  Language- repeat 1 -  Language- follow 3 step command 3 -  Language- read & follow direction 1 -  Write a sentence 1 -  Copy design 1 -  Total score 29 -       Ad8 score reviewed for issues:  Issues making decisions: no  Less interest in hobbies / activities: no  Repeats questions, stories (family complaining): no  Trouble using ordinary gadgets (microwave, computer, phone):no  Forgets the month or year: no  Mismanaging finances: no  Remembering appts: no  Daily problems with thinking and/or memory: no Ad8 score is= 0    Immunization History  Administered Date(s) Administered  . Influenza Split 11/30/2010, 11/20/2011  . Influenza Whole 12/12/2006, 11/27/2007, 11/25/2008, 11/26/2009  . Influenza, High Dose Seasonal PF 11/23/2015, 11/20/2017  .  Influenza,inj,Quad PF,6+ Mos 11/26/2012, 11/06/2013, 11/19/2014  .  Pneumococcal Conjugate-13 12/15/2014  . Pneumococcal Polysaccharide-23 03/16/2009  . Td 02/27/2001  . Tdap 03/21/2012    Qualifies for Shingles Vaccine? Deferred to local pharmacy, pt. aware.  Screening Tests Health Maintenance  Topic Date Due  . TETANUS/TDAP  03/21/2022  . INFLUENZA VACCINE  Completed  . DEXA SCAN  Completed  . PNA vac Low Risk Adult  Completed        Plan:    Bring a copy of your living will and/or healthcare power of attorney to your next office visit.  Continue doing brain stimulating activities (puzzles, reading, adult coloring books, staying active) to keep memory sharp.   Continue to eat heart healthy diet (full of fruits, vegetables, whole grains, lean protein, water--limit salt, fat, and sugar intake) and increase physical activity as tolerated.  Follow-up with nephrologist and PCP with concern over lisinopril and other medications.  I have personally reviewed and noted the following in the patient's chart:   . Medical and social history . Use of alcohol, tobacco or illicit drugs  . Current medications and supplements . Functional ability and status . Nutritional status . Physical activity . Advanced directives . List of other physicians . Vitals . Screenings to include cognitive, depression, and falls . Referrals and appointments  In addition, I have reviewed and discussed with patient certain preventive protocols, quality metrics, and best practice recommendations. A written personalized care plan for preventive services as well as general preventive health recommendations were provided to patient.     Alphia Moh, RN  03/05/2018

## 2018-03-05 ENCOUNTER — Ambulatory Visit (INDEPENDENT_AMBULATORY_CARE_PROVIDER_SITE_OTHER): Payer: Medicare Other | Admitting: *Deleted

## 2018-03-05 VITALS — BP 148/88 | HR 55 | Ht 59.0 in | Wt 190.0 lb

## 2018-03-05 DIAGNOSIS — Z0000 Encounter for general adult medical examination without abnormal findings: Secondary | ICD-10-CM | POA: Diagnosis not present

## 2018-03-05 NOTE — Progress Notes (Signed)
Medical screening examination/treatment/procedure(s) were performed by non-physician practitioner and as supervising physician I was immediately available for consultation/collaboration. I agree with above. Aime Carreras A Burgess Sheriff, MD 

## 2018-03-05 NOTE — Patient Instructions (Addendum)
[name] , Thank you for taking time to come for your Medicare Wellness Visit. I appreciate your ongoing commitment to your health goals. Please review the following plan we discussed and let me know if I can assist you in the future.   These are the goals we discussed: Goals    . patient (pt-stated)     To keep working on staying on healthy; by learning about good food choices and exercise that works for you.    . Patient Stated     Lose weight,  monitor diet closer and continue to exercise. Enjoy life and family.    . Patient Stated     "Maintain what I'm doing. Will think about diet plan to lose weight. I do want to get some housework projects completed".       This is a list of the screening recommended for you and due dates:  Health Maintenance  Topic Date Due  . Tetanus Vaccine  03/21/2022  . Flu Shot  Completed  . DEXA scan (bone density measurement)  Completed  . Pneumonia vaccines  Completed    Bring a copy of your living will and/or healthcare power of attorney to your next office visit.  Continue doing brain stimulating activities (puzzles, reading, adult coloring books, staying active) to keep memory sharp.   Continue to eat heart healthy diet (full of fruits, vegetables, whole grains, lean protein, water--limit salt, fat, and sugar intake) and increase physical activity as tolerated.  Follow-up with nephrologist and PCP with concern over lisinopril and other medications.   Quality Sleep Information, Adult Quality sleep is important for your mental and physical health. It also improves your quality of life. Quality sleep means you:  Are asleep for most of the time you are in bed.  Fall asleep within 30 minutes.  Wake up no more than once a night.  Are awake for no longer than 20 minutes if you do wake up during the night. Most adults need 7-8 hours of quality sleep each night. How can poor sleep affect me? If you do not get enough quality sleep, you may  have:  Mood swings.  Daytime sleepiness.  Confusion.  Decreased reaction time.  Sleep disorders, such as insomnia and sleep apnea.  Difficulty with: ? Solving problems. ? Coping with stress. ? Paying attention. These issues may affect your performance and productivity at work, school, and at home. Lack of sleep may also put you at higher risk for accidents, suicide, and risky behaviors. If you do not get quality sleep you may also be at higher risk for several health problems, including:  Infections.  Type 2 diabetes.  Heart disease.  High blood pressure.  Obesity.  Worsening of long-term conditions, like arthritis, kidney disease, depression, Parkinson's disease, and epilepsy. What actions can I take to get more quality sleep?      Stick to a sleep schedule. Go to sleep and wake up at about the same time each day. Do not try to sleep less on weekdays and make up for lost sleep on weekends. This does not work.  Try to get about 30 minutes of exercise on most days. Do not exercise 2-3 hours before going to bed.  Limit naps during the day to 30 minutes or less.  Do not use any products that contain nicotine or tobacco, such as cigarettes or e-cigarettes. If you need help quitting, ask your health care provider.  Do not drink caffeinated beverages for at least 8 hours  before going to bed. Coffee, tea, and some sodas contain caffeine.  Do not drink alcohol close to bedtime.  Do not eat large meals close to bedtime.  Do not take naps in the late afternoon.  Try to get at least 30 minutes of sunlight every day. Morning sunlight is best.  Make time to relax before bed. Reading, listening to music, or taking a hot bath promotes quality sleep.  Make your bedroom a place that promotes quality sleep. Keep your bedroom dark, quiet, and at a comfortable room temperature. Make sure your bed is comfortable. Take out sleep distractions like TV, a computer, smartphone, and  bright lights.  If you are lying awake in bed for longer than 20 minutes, get up and do a relaxing activity until you feel sleepy.  Work with your health care provider to treat medical conditions that may affect sleeping, such as: ? Nasal obstruction. ? Snoring. ? Sleep apnea and other sleep disorders.  Talk to your health care provider if you think any of your prescription medicines may cause you to have difficulty falling or staying asleep.  If you have sleep problems, talk with a sleep consultant. If you think you have a sleep disorder, talk with your health care provider about getting evaluated by a specialist. Where to find more information  [place] website: https://sleepfoundation.org  National Heart, Lung, and [place] ([place]): http://www.saunders.info/.pdf  Centers for Disease Control and Prevention (CDC): LearningDermatology.pl Contact a health care provider if you:  Have trouble getting to sleep or staying asleep.  Often wake up very early in the morning and cannot get back to sleep.  Have daytime sleepiness.  Have daytime sleep attacks of suddenly falling asleep and sudden muscle weakness (narcolepsy).  Have a tingling sensation in your legs with a strong urge to move your legs (restless legs syndrome).  Stop breathing briefly during sleep (sleep apnea).  Think you have a sleep disorder or are taking a medicine that is affecting your quality of sleep. Summary  Most adults need 7-8 hours of quality sleep each night.  Getting enough quality sleep is an important part of health and well-being.  Make your bedroom a place that promotes quality sleep and avoid things that may cause you to have poor sleep, such as alcohol, caffeine, smoking, and large meals.  Talk to your health care provider if you have trouble falling asleep or staying asleep. This information is not intended to replace advice given to you  by your health care provider. Make sure you discuss any questions you have with your health care provider. Document Released: 05/23/2017 Document Revised: 05/23/2017 Document Reviewed: 05/23/2017 Elsevier Interactive Patient Education  2019 [place] in the Home, Adult Falls can cause injuries. They can happen to people of all ages. There are many things you can do to make your home safe and to help prevent falls. Ask for help when making these changes, if needed. What actions can I take to prevent falls? General Instructions  Use good lighting in all rooms. Replace any light bulbs that burn out.  Turn on the lights when you go into a dark area. Use night-lights.  Keep items that you use often in easy-to-reach places. Lower the shelves around your home if necessary.  Set up your furniture so you have a clear path. Avoid moving your furniture around.  Do not have throw rugs and other things on the floor that can make you trip.  Avoid walking  on wet floors.  If any of your floors are uneven, fix them.  Add color or contrast paint or tape to clearly mark and help you see: ? Any grab bars or handrails. ? First and last steps of stairways. ? Where the edge of each step is.  If you use a stepladder: ? Make sure that it is fully opened. Do not climb a closed stepladder. ? Make sure that both sides of the stepladder are locked into place. ? Ask someone to hold the stepladder for you while you use it.  If there are any pets around you, be aware of where they are. What can I do in the bathroom?      Keep the floor dry. Clean up any water that spills onto the floor as soon as it happens.  Remove soap buildup in the tub or shower regularly.  Use non-skid mats or decals on the floor of the tub or shower.  Attach bath mats securely with double-sided, non-slip rug tape.  If you need to sit down in the shower, use a plastic, non-slip stool.  Install grab bars  by the toilet and in the tub and shower. Do not use towel bars as grab bars. What can I do in the bedroom?  Make sure that you have a light by your bed that is easy to reach.  Do not use any sheets or blankets that are too big for your bed. They should not hang down onto the floor.  Have a firm chair that has side arms. You can use this for support while you get dressed. What can I do in the kitchen?  Clean up any spills right away.  If you need to reach something above you, use a strong step stool that has a grab bar.  Keep electrical cords out of the way.  Do not use floor polish or wax that makes floors slippery. If you must use wax, use non-skid floor wax. What can I do with my stairs?  Do not leave any items on the stairs.  Make sure that you have a light switch at the top of the stairs and the bottom of the stairs. If you do not have them, ask someone to add them for you.  Make sure that there are handrails on both sides of the stairs, and use them. Fix handrails that are broken or loose. Make sure that handrails are as long as the stairways.  Install non-slip stair treads on all stairs in your home.  Avoid having throw rugs at the top or bottom of the stairs. If you do have throw rugs, attach them to the floor with carpet tape.  Choose a carpet that does not hide the edge of the steps on the stairway.  Check any carpeting to make sure that it is firmly attached to the stairs. Fix any carpet that is loose or worn. What can I do on the outside of my home?  Use bright outdoor lighting.  Regularly fix the edges of walkways and driveways and fix any cracks.  Remove anything that might make you trip as you walk through a door, such as a raised step or threshold.  Trim any bushes or trees on the path to your home.  Regularly check to see if handrails are loose or broken. Make sure that both sides of any steps have handrails.  Install guardrails along the edges of any  raised decks and porches.  Clear walking paths of anything that might  make someone trip, such as tools or rocks.  Have any leaves, snow, or ice cleared regularly.  Use sand or salt on walking paths during winter.  Clean up any spills in your garage right away. This includes grease or oil spills. What other actions can I take?  Wear shoes that: ? Have a low heel. Do not wear high heels. ? Have rubber bottoms. ? Are comfortable and fit you well. ? Are closed at the toe. Do not wear open-toe sandals.  Use tools that help you move around (mobility aids) if they are needed. These include: ? Canes. ? Walkers. ? Scooters. ? Crutches.  Review your medicines with your doctor. Some medicines can make you feel dizzy. This can increase your chance of falling. Ask your doctor what other things you can do to help prevent falls. Where to find more information  Centers for Disease Control and Prevention, [place]: https://garcia.biz/  [employer] on Aging: BrainJudge.co.uk Contact a doctor if:  You are afraid of falling at home.  You feel weak, drowsy, or dizzy at home.  You fall at home. Summary  There are many simple things that you can do to make your home safe and to help prevent falls.  Ways to make your home safe include removing tripping hazards and installing grab bars in the bathroom.  Ask for help when making these changes in your home. This information is not intended to replace advice given to you by your health care provider. Make sure you discuss any questions you have with your health care provider. Document Released: 12/10/2008 Document Revised: 09/28/2016 Document Reviewed: 09/28/2016 Elsevier Interactive Patient Education  2019 [employer].

## 2018-05-24 ENCOUNTER — Ambulatory Visit: Payer: Self-pay | Admitting: Internal Medicine

## 2018-05-24 NOTE — Telephone Encounter (Signed)
Pt agreed to monitor and record her BP readings and call back next week ,   Her BP has now dropped to 174/94.

## 2018-05-24 NOTE — Telephone Encounter (Signed)
Pt. Reports she has been upset about the corona virus this week and missed one dose of her BP medicine. BP this afternoon is 206/114 and 205/111. Denies any headache, vision changes, dizziness or chest pain. Spoke with Sam in the practice and will send triage note. Reason for Disposition . Systolic BP  >= 287 OR Diastolic >= 681  Answer Assessment - Initial Assessment Questions 1. BLOOD PRESSURE: "What is the blood pressure?" "Did you take at least two measurements 5 minutes apart?"     206/114   2. ONSET: "When did you take your blood pressure?"     Just a few minutes ago 3. HOW: "How did you obtain the blood pressure?" (e.g., visiting nurse, automatic home BP monitor)     Home monitor 4. HISTORY: "Do you have a history of high blood pressure?"     Yes 5. MEDICATIONS: "Are you taking any medications for blood pressure?" "Have you missed any doses recently?"     Missed one dose Wed. 6. OTHER SYMPTOMS: "Do you have any symptoms?" (e.g., headache, chest pain, blurred vision, difficulty breathing, weakness)     No 7. PREGNANCY: "Is there any chance you are pregnant?" "When was your last menstrual period?"     No  Protocols used: HIGH BLOOD PRESSURE-A-AH

## 2018-05-24 NOTE — Telephone Encounter (Signed)
Please advise if med change is needed.

## 2018-05-24 NOTE — Telephone Encounter (Signed)
Would recommend to monitor BP and call back next week with readings.

## 2018-05-30 ENCOUNTER — Telehealth: Payer: Self-pay | Admitting: Internal Medicine

## 2018-05-30 NOTE — Telephone Encounter (Signed)
BPs look fine, no changes needed

## 2018-05-30 NOTE — Telephone Encounter (Signed)
Patient informed of results and stated understanding

## 2018-05-30 NOTE — Telephone Encounter (Signed)
Copied from [place] [phone]. Topic: Quick Communication - See Telephone Encounter >> May 30, 2018  9:31 AM Margot Ables wrote: CRM for notification. See Telephone encounter for: 05/30/18.  Pt has been monitoring BP as requested 3/28 8:45am BP 145/89 P 63, 7:30pm BP 151/82 P 62 3/29 8:35am BP 158/91 P 65, 7:30pm 150/92 P 60 3/30 8:30am 165/92 P 60, 4:00pm 141/91 P 57 3/31 7:30am 143/90 P 62, 7:44pm 144/87 P 56 4/2 9:20am 153/91 P 58

## 2018-06-10 ENCOUNTER — Other Ambulatory Visit: Payer: Self-pay | Admitting: Internal Medicine

## 2018-06-11 ENCOUNTER — Ambulatory Visit: Payer: Medicare Other | Admitting: Internal Medicine

## 2018-07-11 ENCOUNTER — Other Ambulatory Visit: Payer: Self-pay | Admitting: Internal Medicine

## 2018-07-29 ENCOUNTER — Ambulatory Visit: Payer: Medicare Other | Admitting: Pulmonary Disease

## 2018-08-15 ENCOUNTER — Telehealth: Payer: Self-pay

## 2018-08-15 NOTE — Telephone Encounter (Signed)
Tried calling patient to see if she could move her appointment to 9:40 instead of 8:40 to stay covid compliant. Also need to do the screening and check in process. Phone just kept ringing so unable to leave voicemail. Will try calling later today routing to [facility] patient calls back in the mean time

## 2018-08-16 ENCOUNTER — Encounter: Payer: Self-pay | Admitting: Internal Medicine

## 2018-08-16 ENCOUNTER — Ambulatory Visit (INDEPENDENT_AMBULATORY_CARE_PROVIDER_SITE_OTHER): Payer: Medicare Other | Admitting: Internal Medicine

## 2018-08-16 ENCOUNTER — Other Ambulatory Visit (INDEPENDENT_AMBULATORY_CARE_PROVIDER_SITE_OTHER): Payer: Medicare Other

## 2018-08-16 ENCOUNTER — Other Ambulatory Visit: Payer: Self-pay

## 2018-08-16 VITALS — BP 120/84 | HR 60 | Temp 97.8°F | Ht 59.0 in | Wt 190.0 lb

## 2018-08-16 DIAGNOSIS — Z0000 Encounter for general adult medical examination without abnormal findings: Secondary | ICD-10-CM

## 2018-08-16 DIAGNOSIS — I10 Essential (primary) hypertension: Secondary | ICD-10-CM

## 2018-08-16 DIAGNOSIS — N183 Chronic kidney disease, stage 3 unspecified: Secondary | ICD-10-CM

## 2018-08-16 DIAGNOSIS — E782 Mixed hyperlipidemia: Secondary | ICD-10-CM

## 2018-08-16 DIAGNOSIS — E039 Hypothyroidism, unspecified: Secondary | ICD-10-CM

## 2018-08-16 DIAGNOSIS — M1A00X0 Idiopathic chronic gout, unspecified site, without tophus (tophi): Secondary | ICD-10-CM

## 2018-08-16 LAB — CBC
HCT: 40.4 % (ref 36.0–46.0)
Hemoglobin: 13.2 g/dL (ref 12.0–15.0)
MCHC: 32.6 g/dL (ref 30.0–36.0)
MCV: 91.2 fl (ref 78.0–100.0)
Platelets: 304 10*3/uL (ref 150.0–400.0)
RBC: 4.43 Mil/uL (ref 3.87–5.11)
RDW: 13.9 % (ref 11.5–15.5)
WBC: 7.9 10*3/uL (ref 4.0–10.5)

## 2018-08-16 LAB — COMPREHENSIVE METABOLIC PANEL
ALT: 21 U/L (ref 0–35)
AST: 20 U/L (ref 0–37)
Albumin: 4.2 g/dL (ref 3.5–5.2)
Alkaline Phosphatase: 74 U/L (ref 39–117)
BUN: 19 mg/dL (ref 6–23)
CO2: 26 mEq/L (ref 19–32)
Calcium: 9.1 mg/dL (ref 8.4–10.5)
Chloride: 106 mEq/L (ref 96–112)
Creatinine, Ser: 1.17 mg/dL (ref 0.40–1.20)
GFR: 53.25 mL/min — ABNORMAL LOW (ref >60.00)
Glucose, Bld: 94 mg/dL (ref 70–99)
Potassium: 4.1 mEq/L (ref 3.5–5.1)
Sodium: 141 mEq/L (ref 135–145)
Total Bilirubin: 0.5 mg/dL (ref 0.2–1.2)
Total Protein: 7.2 g/dL (ref 6.0–8.3)

## 2018-08-16 LAB — LIPID PANEL
Cholesterol: 174 mg/dL (ref 0–200)
HDL: 51.5 mg/dL (ref >39.00)
LDL Cholesterol: 88 mg/dL (ref 0–99)
NonHDL: 122.12
Total CHOL/HDL Ratio: 3
Triglycerides: 171 mg/dL — ABNORMAL HIGH (ref 0.0–149.0)
VLDL: 34.2 mg/dL (ref 0.0–40.0)

## 2018-08-16 LAB — TSH: TSH: 4.14 u[IU]/mL (ref 0.35–4.50)

## 2018-08-16 LAB — T4, FREE: Free T4: 0.77 ng/dL (ref 0.60–1.60)

## 2018-08-16 LAB — HEMOGLOBIN A1C: Hgb A1c MFr Bld: 5.6 % (ref 4.6–6.5)

## 2018-08-16 LAB — URIC ACID: Uric Acid, Serum: 7.1 mg/dL — ABNORMAL HIGH (ref 2.4–7.0)

## 2018-08-16 NOTE — Progress Notes (Signed)
   Subjective:   Patient ID: April Donaldson, female    DOB: 21-Sep-1931, 85 y.o.   MRN: [id number]  HPI The patient is an 85 YO female coming in for physical.   PMH, [place], social history reviewed and updated  Review of Systems  Constitutional: Negative.   HENT: Negative.   Eyes: Negative.   Respiratory: Negative for cough, chest tightness and shortness of breath.   Cardiovascular: Negative for chest pain, palpitations and leg swelling.  Gastrointestinal: Negative for abdominal distention, abdominal pain, constipation, diarrhea, nausea and vomiting.  Musculoskeletal: Positive for arthralgias.  Skin: Negative.   Neurological: Negative.   Psychiatric/Behavioral: Negative.     Objective:  Physical Exam Constitutional:      Appearance: She is well-developed. She is obese.  HENT:     Head: Normocephalic and atraumatic.  Neck:     Musculoskeletal: Normal range of motion.  Cardiovascular:     Rate and Rhythm: Normal rate and regular rhythm.  Pulmonary:     Effort: Pulmonary effort is normal. No respiratory distress.     Breath sounds: Normal breath sounds. No wheezing or rales.  Abdominal:     General: Bowel sounds are normal. There is no distension.     Palpations: Abdomen is soft.     Tenderness: There is no abdominal tenderness. There is no rebound.  Skin:    General: Skin is warm and dry.  Neurological:     Mental Status: She is alert and oriented to person, place, and time.     Coordination: Coordination normal.     Vitals:   08/16/18 0833  BP: 120/84  Pulse: 60  Temp: 97.8 F (36.6 C)  TempSrc: Oral  SpO2: 97%  Weight: 190 lb (86.2 kg)  Height: 4\' 11"  (1.499 m)    Assessment & Plan:

## 2018-08-16 NOTE — Assessment & Plan Note (Signed)
Checking TSH and adjust as needed synthroid 50 mcg daily.  

## 2018-08-16 NOTE — Assessment & Plan Note (Signed)
Flu shot yearly. Pneumonia complete. Shingrix complete. Tetanus up to date. Colonoscopy aged out. Mammogram aged out, pap smear aged out and dexa up to date. Counseled about sun safety and mole surveillance. Counseled about the dangers of distracted driving. Given 10 year screening recommendations.

## 2018-08-16 NOTE — Assessment & Plan Note (Signed)
Checking uric acid level and adjust allopurinol as needed.  

## 2018-08-16 NOTE — Assessment & Plan Note (Signed)
Complicated by hypertension and gout, BMI 38. Counseled about diet and exercise to help.

## 2018-08-16 NOTE — Assessment & Plan Note (Signed)
Checking lipid panel and adjust as needed simvastatin 20 mg daily.  

## 2018-08-16 NOTE — Assessment & Plan Note (Signed)
Checking CMP and adjust as needed. From HTN which is well controlled.

## 2018-08-16 NOTE — Assessment & Plan Note (Signed)
Taking lisinopril and hctz and atenolol and well controlled, checking CMP and adjust as needed.

## 2018-08-16 NOTE — Patient Instructions (Signed)

## 2018-08-23 ENCOUNTER — Ambulatory Visit: Payer: Self-pay | Admitting: *Deleted

## 2018-08-23 NOTE — Telephone Encounter (Signed)
Pt called with having a sharp pain under her right breast that lasts for seconds. This has been going on for months. She denies nausea, sweating, dizziness, weakness, shortness of breath, cough or fever. She saw Dr. Sharlet Salina last Friday for a physical but did not mention this, was not happening at that time. The pain will come and go. Advised of cardiac symptoms, mentioned above and she should call 911 if experiencing any. She voiced understanding. Advised that will check the practice for recommendation. Per Dr. Sharlet Salina pt should go to the ED to be assessed. Advised pt of this. She voiced understanding and that she would have to call someone to take her and did not know if she would get there today. Reassured pt that we want her to be safe and hope she can get to an ED. She voiced understanding. Routing to [facility] for review.   Reason for Disposition . [1] Chest pain lasting <= 5 minutes AND [2] NO chest pain or cardiac symptoms now(Exceptions: pains lasting a few seconds)  Answer Assessment - Initial Assessment Questions 1. LOCATION: "Where does it hurt?"       Left chest area 2. RADIATION: "Does the pain go anywhere else?" (e.g., into neck, jaw, arms, back)     Does not 3. ONSET: "When did the chest pain begin?" (Minutes, hours or days)      A few months ago 4. PATTERN "Does the pain come and go, or has it been constant since it started?"  "Does it get worse with exertion?"      Comes and goes. When trying to lay on that side it hurts 5. DURATION: "How long does it last" (e.g., seconds, minutes, hours)     seconds 6. SEVERITY: "How bad is the pain?"  (e.g., Scale 1-10; mild, moderate, or severe)    - MILD (1-3): doesn't interfere with normal activities     - MODERATE (4-7): interferes with normal activities or awakens from sleep    - SEVERE (8-10): excruciating pain, unable to do any normal activities       Mild  7. CARDIAC RISK FACTORS: "Do you have any history of heart  problems or risk factors for heart disease?" (e.g., prior heart attack, angina; high blood pressure, diabetes, being overweight, high cholesterol, smoking, or strong family history of heart disease)     High blood pressure, over weight, high cholesterol 8. PULMONARY RISK FACTORS: "Do you have any history of lung disease?"  (e.g., blood clots in lung, asthma, emphysema, birth control pills)     no 9. CAUSE: "What do you think is causing the chest pain?"     Not sure 10. OTHER SYMPTOMS: "Do you have any other symptoms?" (e.g., dizziness, nausea, vomiting, sweating, fever, difficulty breathing, cough)       no 11. PREGNANCY: "Is there any chance you are pregnant?" "When was your last menstrual period?"       no  Protocols used: CHEST PAIN-A-AH

## 2018-08-28 DIAGNOSIS — H00011 Hordeolum externum right upper eyelid: Secondary | ICD-10-CM | POA: Insufficient documentation

## 2018-09-12 ENCOUNTER — Telehealth: Payer: Self-pay | Admitting: Internal Medicine

## 2018-09-12 NOTE — Telephone Encounter (Signed)
Medication Refill - Medication: allopurinol (ZYLOPRIM) 100 MG tablet   Preferred Pharmacy:  [id number] - [place], [place] [phone] (Phone) [phone] (Fax)   Pt was advised that RX refills may take up to 3 business days. We ask that you follow-up with your pharmacy.

## 2018-10-10 ENCOUNTER — Ambulatory Visit (INDEPENDENT_AMBULATORY_CARE_PROVIDER_SITE_OTHER): Payer: Medicare Other | Admitting: Pulmonary Disease

## 2018-10-10 ENCOUNTER — Other Ambulatory Visit: Payer: Self-pay

## 2018-10-10 ENCOUNTER — Encounter: Payer: Self-pay | Admitting: Pulmonary Disease

## 2018-10-10 VITALS — BP 124/80 | HR 61 | Ht 59.0 in | Wt 188.0 lb

## 2018-10-10 DIAGNOSIS — Z9989 Dependence on other enabling machines and devices: Secondary | ICD-10-CM | POA: Diagnosis not present

## 2018-10-10 DIAGNOSIS — J310 Chronic rhinitis: Secondary | ICD-10-CM

## 2018-10-10 DIAGNOSIS — G4730 Sleep apnea, unspecified: Secondary | ICD-10-CM | POA: Diagnosis not present

## 2018-10-10 DIAGNOSIS — G4733 Obstructive sleep apnea (adult) (pediatric): Secondary | ICD-10-CM

## 2018-10-10 DIAGNOSIS — E669 Obesity, unspecified: Secondary | ICD-10-CM

## 2018-10-10 NOTE — Patient Instructions (Signed)
Follow up in 1 year.

## 2018-10-10 NOTE — Progress Notes (Signed)
April Donaldson, April Donaldson, April Donaldson  Chief Complaint  Patient presents with  . Follow-up    OSA >> Using CPAP occasionally. Denies issues with machine, mask or pressure. DME is [facility].    Constitutional:  BP 124/80 (BP Location: Right Arm, Cuff Size: Normal)   Pulse 61   Ht 4\' 11"  (1.499 m)   Wt 188 lb (85.3 kg)   SpO2 100%   BMI 37.97 kg/m   Past Medical History:  IBS, Hypothyroidism, HTN, HLD, Colon polyps, HH, Gout, Glaucoma, GERD, CVA, Anxiety, Cellulitis Lt leg 2015, Chronic back pain, Difficult intubation  Brief Summary:  April Donaldson is a 85 y.o. female with obstructive sleep apnea.  She will watch TV before going to bed.  She will sometimes fall asleep w/o realizing this.    She has full face mask.  No issues with pressure or mask fit.  Sometimes gets dry nose April mouth.  Uses saline drops.  Will get sinus congestion April cough in the morning after using CPAP.  Physical Exam:   Appearance - well kempt   ENMT - clear nasal mucosa, midline nasal  septum, no oral exudates, no LAN, trachea midline, MP 4, scalloped tongue  Respiratory - normal chest wall, normal respiratory effort, no accessory muscle use, no wheeze/rales  CV - s1s2 regular rate April rhythm, no murmurs, no peripheral edema, radial pulses symmetric  GI - soft, non tender, no masses  Lymph - no adenopathy noted in neck April axillary areas  MSK - normal gait  Ext - no cyanosis, clubbing, or joint inflammation noted  Skin - no rashes, lesions, or ulcers  Neuro - normal strength, oriented x 3  Psych - normal mood April affect   Assessment/Plan:   Obstructive sleep apnea. - she reports compliance with CPAP April benefit from therapy - continue auto CPAP - encouraged her to use CPAP whenever she is asleep  CPAP rhinitis. - can use saline drops, adjust humidifier - can try OTC steroid nose spray if symptoms get worse  Obesity. - discussed options to assist with weight loss    Patient Instructions  Follow up in 1 year   April Mires, April Donaldson [place] Pager: [phone] 10/10/2018, 9:46 AM  Flow Sheet      Sleep tests:  PSG 10/14/07 >> AHI 20 Auto CPAP 09/10/18 to 10/09/18 >> used on 26 of 30 nights with average 4 hrs 34 min.  Average AHI 0.5 with median CPAP 9 April 95 th percentile CPAP 11 cm H2O  Medications:   Allergies as of 10/10/2018      Reactions   Amlodipine Besylate Swelling, Other (See Comments)   Causes swelling   Clonidine Nausea Only, Other (See Comments)   Sweating, felt faint   Diltiazem Hcl Swelling, Other (See Comments)   meds did not work for her----causes swelling   Klonopin [clonazepam] Other (See Comments)   Vivid dreams   Tetracycline Swelling   Doxycycline Hyclate Other (See Comments)   REACTION:  tetracycline   Hydralazine Rash   Iodine Other (See Comments)   IVP contrast Pt. States topical iodine is fine to use without any irritation. H.Mickley RN   Labetalol Hcl Other (See Comments)   Unknown   Lidocaine Rash   Olmesartan Medoxomil Other (See Comments)   Unknown      Medication List       Accurate as of October 10, 2018  9:46 AM. If you have any questions, ask your nurse or doctor.  allopurinol 100 MG tablet Commonly known as: ZYLOPRIM TAKE 1 TABLET (100 MG TOTAL) BY MOUTH DAILY.   aspirin 81 MG tablet Take 81 mg by mouth daily.   atenolol 50 MG tablet Commonly known as: TENORMIN TAKE 2 TABLETS DAILY   dorzolamide-timolol 22.3-6.8 MG/ML ophthalmic solution Commonly known as: COSOPT Place 1 drop into both eyes 2 (two) times daily.   latanoprost 0.005 % ophthalmic solution Commonly known as: XALATAN Place 1 drop into both eyes at bedtime.   levothyroxine 50 MCG tablet Commonly known as: SYNTHROID Take 1 tablet (50 mcg total) by mouth daily before breakfast.   lisinopril 5 MG tablet Commonly known as: ZESTRIL Take 5 mg by mouth 2 (two) times daily.   simvastatin 20 MG  tablet Commonly known as: ZOCOR TAKE 1 TABLET DAILY AT 6 P.M.   triamterene-hydrochlorothiazide 37.5-25 MG capsule Commonly known as: DYAZIDE TAKE ONE CAPSULE BY MOUTH ONCE DAILY       Past Surgical History:  She  has a past surgical history that includes hysterctomy April ooperectomy (1970's); thyroid lobectomy for a cyst (10/2000); Functional endoscopic sinus surgery (05/2003); Colonoscopy; Tonsillectomy; Appendectomy; Dilation April curettage of uterus; Mass excision (2003); Mass excision (Left, 09/17/2013); cataracts (Bilateral); Abdominal hysterectomy; Cystoscopy/retrograde/ureteroscopy (Bilateral, 04/09/2017); April Cataract extraction, bilateral (Bilateral, 2017).  Family History:  Her family history includes Asthma in her brother April sister; Brain cancer (age of onset: 3) in her sister; Breast cancer (age of onset: 5) in her sister; Heart disease in her mother; Throat cancer (age of onset: 2) in her brother.  Social History:  She  reports that she has never smoked. She has never used smokeless tobacco. She reports that she does not drink alcohol or use drugs.

## 2018-11-09 ENCOUNTER — Ambulatory Visit (INDEPENDENT_AMBULATORY_CARE_PROVIDER_SITE_OTHER): Payer: Medicare Other

## 2018-11-09 ENCOUNTER — Other Ambulatory Visit: Payer: Self-pay

## 2018-11-09 DIAGNOSIS — Z23 Encounter for immunization: Secondary | ICD-10-CM | POA: Diagnosis not present

## 2018-11-28 ENCOUNTER — Encounter: Payer: Self-pay | Admitting: Internal Medicine

## 2018-11-28 ENCOUNTER — Ambulatory Visit (INDEPENDENT_AMBULATORY_CARE_PROVIDER_SITE_OTHER): Payer: Medicare Other | Admitting: Internal Medicine

## 2018-11-28 ENCOUNTER — Other Ambulatory Visit: Payer: Self-pay

## 2018-11-28 VITALS — BP 126/90 | HR 61 | Temp 98.4°F | Ht 59.0 in | Wt 185.0 lb

## 2018-11-28 DIAGNOSIS — M25511 Pain in right shoulder: Secondary | ICD-10-CM | POA: Diagnosis not present

## 2018-11-28 DIAGNOSIS — M25512 Pain in left shoulder: Secondary | ICD-10-CM | POA: Insufficient documentation

## 2018-11-28 MED ORDER — DICLOFENAC SODIUM 1 % TD GEL: 2.0000 g | Freq: Four times a day (QID) | TRANSDERMAL | 3 refills | Status: DC

## 2018-11-28 NOTE — Assessment & Plan Note (Signed)
Acute at this time potentially due to biopsy. She is using tylenol if needed and rx for voltaren gel. Wait 1-2 weeks and if no improvement can readdress. No indication for imaging at this time.

## 2018-11-28 NOTE — Patient Instructions (Signed)
We have sent in the voltaren gel to use on the shoulder up to 4 times a day.   This should get better within a couple of weeks.    Shoulder Range of Motion Exercises Shoulder range of motion (ROM) exercises are done to keep the shoulder moving freely or to increase movement. They are often recommended for people who have shoulder pain or stiffness or who are recovering from a shoulder surgery. Phase 1 exercises When you are able, do this exercise 1-2 times per day for 30-60 seconds in each direction, or as directed by your health care provider. Pendulum exercise To do this exercise while sitting: 1. Sit in a chair or at the edge of your bed with your feet flat on the floor. 2. Let your affected arm hang down in front of you over the edge of the bed or chair. 3. Relax your shoulder, arm, and hand. [place] your body so your arm gently swings in small circles. You can also use your unaffected arm to start the motion. 5. Repeat changing the direction of the circles, swinging your arm left and right, and swinging your arm forward and back. To do this exercise while standing: 1. Stand next to a sturdy chair or table, and hold on to it with your hand on your unaffected side. 2. Bend forward at the waist. 3. Bend your knees slightly. 4. Relax your shoulder, arm, and hand. 5. While keeping your shoulder relaxed, use body motion to swing your arm in small circles. 6. Repeat changing the direction of the circles, swinging your arm left and right, and swinging your arm forward and back. 7. Between exercises, stand up tall and take a short break to relax your lower back.  Phase 2 exercises Do these exercises 1-2 times per day or as told by your health care provider. Hold each stretch for 30 seconds, and repeat 3 times. Do the exercises with one or both arms as instructed by your health care provider. For these exercises, sit at a table with your hand and arm supported by the table. A chair that slides  easily or has wheels can be helpful. External rotation 1. Turn your chair so that your affected side is nearest to the table. 2. Place your forearm on the table to your side. Bend your elbow about 90 at the elbow (right angle) and place your hand palm facing down on the table. Your elbow should be about 6 inches away from your side. 3. Keeping your arm on the table, lean your body forward. Abduction 1. Turn your chair so that your affected side is nearest to the table. 2. Place your forearm and hand on the table so that your thumb points toward the ceiling and your arm is straight out to your side. 3. Slide your hand out to the side and away from you, using your unaffected arm to do the work. 4. To increase the stretch, you can slide your chair away from the table. Flexion: forward stretch 1. Sit facing the table. Place your hand and elbow on the table in front of you. 2. Slide your hand forward and away from you, using your unaffected arm to do the work. 3. To increase the stretch, you can slide your chair backward. Phase 3 exercises Do these exercises 1-2 times per day or as told by your health care provider. Hold each stretch for 30 seconds, and repeat 3 times. Do the exercises with one or both arms as instructed by  your health care provider. Cross-body stretch: posterior capsule stretch 1. Lift your arm straight out in front of you. 2. Bend your arm 90 at the elbow (right angle) so your forearm moves across your body. 3. Use your other arm to gently pull the elbow across your body, toward your other shoulder. Wall climbs 1. Stand with your affected arm extended out to the side with your hand resting on a door frame. 2. Slide your hand slowly up the door frame. 3. To increase the stretch, step through the door frame. Keep your body upright and do not lean. Wand exercises You will need a cane, a piece of PVC pipe, or a sturdy wooden dowel for wand exercises. Flexion To do this exercise  while standing: 1. Hold the wand with both of your hands, palms down. 2. Using the other arm to help, lift your arms up and over your head, if able. 3. Push upward with your other arm to gently increase the stretch. To do this exercise while lying down: 1. Lie on your back with your elbows resting on the floor and the wand in both your hands. Your hands will be palm down, or pointing toward your feet. 2. Lift your hands toward the ceiling, using your unaffected arm to help if needed. 3. Bring your arms overhead as able, using your unaffected arm to help if needed. Internal rotation 1. Stand while holding the wand behind you with both hands. Your unaffected arm should be extended above your head with the arm of the affected side extended behind you at the level of your waist. The wand should be pointing straight up and down as you hold it. 2. Slowly pull the wand up behind your back by straightening the elbow of your unaffected arm and bending the elbow of your affected arm. External rotation 1. Lie on your back with your affected upper arm supported on a small pillow or rolled towel. When you first do this exercise, keep your upper arm close to your body. Over time, bring your arm up to a 90 angle out to the side. 2. Hold the wand across your stomach and with both hands palm up. Your elbow on your affected side should be bent at a 90 angle. 3. Use your unaffected side to help push your forearm away from you and toward the floor. Keep your elbow on your affected side bent at a 90 angle. Contact a health care provider if you have:  New or increasing pain.  New numbness, tingling, weakness, or discoloration in your arm or hand. This information is not intended to replace advice given to you by your health care provider. Make sure you discuss any questions you have with your health care provider. Document Released: 11/12/2002 Document Revised: 03/28/2017 Document Reviewed: 03/28/2017 Elsevier  Patient Education  2020 [employer].

## 2018-11-28 NOTE — Progress Notes (Signed)
   Subjective:   Patient ID: April Donaldson, female    DOB: April 07, 1931, 85 y.o.   MRN: [id number]  HPI The patient is an 85 YO female coming in for right shoulder pain (started about 1 week ago when dermatologist numbed it and took off a mole, she is overall improving slightly, can use the arm without much reduction in ROM, no numbness or weakness or tingling, did take tylenol which helped some but not taking anymore) and left shoulder pain (started within the last week, 7/10 pain, has tried tylenol which did help but she stopped as she does not like taking pills, she has had problems with it in the past, did therapy for it in the past which helped, denies injury or overuse, does sleep on that side due to left sided sciatica, denies numbness or swelling in the arm, does have significant loss of ROM due to pain now).   Review of Systems  Constitutional: Negative.   HENT: Negative.   Eyes: Negative.   Respiratory: Negative for cough, chest tightness and shortness of breath.   Cardiovascular: Negative for chest pain, palpitations and leg swelling.  Gastrointestinal: Negative for abdominal distention, abdominal pain, constipation, diarrhea, nausea and vomiting.  Musculoskeletal: Positive for arthralgias and myalgias. Negative for neck pain and neck stiffness.  Skin: Negative.   Neurological: Negative.   Psychiatric/Behavioral: Negative.     Objective:  Physical Exam Constitutional:      Appearance: She is well-developed.  HENT:     Head: Normocephalic and atraumatic.  Neck:     Musculoskeletal: Normal range of motion.  Cardiovascular:     Rate and Rhythm: Normal rate and regular rhythm.  Pulmonary:     Effort: Pulmonary effort is normal. No respiratory distress.     Breath sounds: Normal breath sounds. No wheezing or rales.  Abdominal:     General: Bowel sounds are normal. There is no distension.     Palpations: Abdomen is soft.     Tenderness: There is no abdominal tenderness. There  is no rebound.  Musculoskeletal:        General: Tenderness present.     Comments: Pain AC joint left without swelling, ROM is limited by pain, passive ROM almost intact, right AC joint with minimal tenderness, no pain over scapular muscles bilaterally.   Skin:    General: Skin is warm and dry.  Neurological:     Mental Status: She is alert and oriented to person, place, and time.     Coordination: Coordination normal.     Vitals:   11/28/18 0953  BP: 126/90  Pulse: 61  Temp: 98.4 F (36.9 C)  TempSrc: Oral  SpO2: 97%  Weight: 185 lb (83.9 kg)  Height: 4\' 11"  (1.499 m)    Assessment & Plan:

## 2018-11-28 NOTE — Assessment & Plan Note (Signed)
Rx for voltaren gel and advised we can do PT if not improving. Likely strain from overuse with right shoulder hurting after biopsy.

## 2018-12-12 ENCOUNTER — Other Ambulatory Visit: Payer: Self-pay | Admitting: Internal Medicine

## 2019-01-07 ENCOUNTER — Other Ambulatory Visit: Payer: Self-pay | Admitting: Internal Medicine

## 2019-01-29 ENCOUNTER — Other Ambulatory Visit: Payer: Self-pay | Admitting: Internal Medicine

## 2019-02-12 ENCOUNTER — Other Ambulatory Visit: Payer: Self-pay | Admitting: Internal Medicine

## 2019-02-17 ENCOUNTER — Ambulatory Visit: Payer: Medicare Other | Admitting: Internal Medicine

## 2019-02-17 ENCOUNTER — Telehealth: Payer: Self-pay | Admitting: Pulmonary Disease

## 2019-02-17 NOTE — Telephone Encounter (Signed)
Dr. Halford Chessman,  I called the patient back and she stated that she has been difficulty staying asleep. Patient stated the cpap machine has been working. Has not noticed any leaks or error messages on the machine display.  The patient states she may get 3 - 7 hours of sleep a night. But when she wakes up she feels like she needs to sleep some more, but it will take a while before she can fall back asleep again.  Patient wanted to make you aware before she took the CPAP machine to the DME to have it checked.  Her last appointment with you was 11/28/18. Did you want her to come into see you, or should sleep study be ordered?

## 2019-02-18 ENCOUNTER — Other Ambulatory Visit: Payer: Self-pay

## 2019-02-18 ENCOUNTER — Emergency Department (HOSPITAL_COMMUNITY)
Admission: EM | Admit: 2019-02-18 | Discharge: 2019-02-18 | Disposition: A | Discharge status: Home / Self Care | Payer: Medicare Other | Attending: Emergency Medicine | Admitting: Emergency Medicine

## 2019-02-18 ENCOUNTER — Emergency Department (HOSPITAL_COMMUNITY): Payer: Medicare Other

## 2019-02-18 DIAGNOSIS — Z79899 Other long term (current) drug therapy: Secondary | ICD-10-CM | POA: Diagnosis not present

## 2019-02-18 DIAGNOSIS — E039 Hypothyroidism, unspecified: Secondary | ICD-10-CM | POA: Diagnosis not present

## 2019-02-18 DIAGNOSIS — Z7982 Long term (current) use of aspirin: Secondary | ICD-10-CM | POA: Diagnosis not present

## 2019-02-18 DIAGNOSIS — I10 Essential (primary) hypertension: Secondary | ICD-10-CM | POA: Diagnosis present

## 2019-02-18 LAB — CBC
HCT: 42.4 % (ref 36.0–46.0)
Hemoglobin: 14 g/dL (ref 12.0–15.0)
MCH: 29.7 pg (ref 26.0–34.0)
MCHC: 33 g/dL (ref 30.0–36.0)
MCV: 89.8 fL (ref 80.0–100.0)
Platelets: 343 10*3/uL (ref 150–400)
RBC: 4.72 MIL/uL (ref 3.87–5.11)
RDW: 13.4 % (ref 11.5–15.5)
WBC: 9.4 10*3/uL (ref 4.0–10.5)
nRBC: 0 % (ref 0.0–0.2)

## 2019-02-18 LAB — BASIC METABOLIC PANEL
Anion gap: 12 (ref 5–15)
BUN: 22 mg/dL (ref 8–23)
CO2: 22 mmol/L (ref 22–32)
Calcium: 9.3 mg/dL (ref 8.9–10.3)
Chloride: 103 mmol/L (ref 98–111)
Creatinine, Ser: 1.34 mg/dL — ABNORMAL HIGH (ref 0.44–1.00)
GFR calc Af Amer: 42 mL/min — ABNORMAL LOW (ref >60)
GFR calc non Af Amer: 36 mL/min — ABNORMAL LOW (ref >60)
Glucose, Bld: 111 mg/dL — ABNORMAL HIGH (ref 70–99)
Potassium: 3.5 mmol/L (ref 3.5–5.1)
Sodium: 137 mmol/L (ref 135–145)

## 2019-02-18 LAB — TROPONIN I (HIGH SENSITIVITY)
Troponin I (High Sensitivity): 6 ng/L (ref <18)
Troponin I (High Sensitivity): 14 ng/L (ref <18)

## 2019-02-18 MED ORDER — HYDROCHLOROTHIAZIDE 25 MG PO TABS
25.0000 mg | ORAL_TABLET | Freq: Every day | ORAL | Status: DC
  Administered 2019-02-18: 25 mg via ORAL
  Filled 2019-02-18: qty 1

## 2019-02-18 NOTE — ED Notes (Signed)
Patient Alert and oriented to baseline. Stable and ambulatory to baseline. Patient verbalized understanding of the discharge instructions.  Patient belongings were taken by the patient.   

## 2019-02-18 NOTE — Discharge Instructions (Addendum)
Her blood pressure improved after the treatment in the emergency department.  Make sure you are staying on a low-salt diet.  Continue taking the currently prescribed medications.  Return here, if needed.

## 2019-02-18 NOTE — ED Notes (Signed)
Pt ambulated to bathroom with no assistance.  

## 2019-02-18 NOTE — ED Provider Notes (Signed)
[facility] EMERGENCY DEPARTMENT Provider Note   CSN: [id number] Arrival date & time: 02/18/19  1529     History Chief Complaint  Patient presents with  . Hypertension    April Donaldson is a 85 y.o. female.  HPI   Patient regular checks her blood pressure, to stay on top of it.  She has noticed some pressures in the range of 200/100 in the last several days.  She contacted her nephrology office who advised she increase her lisinopril from 5 mg twice daily to 10 mg in the morning and 5 at night.  She denies recent illnesses including fever, chills, shortness of breath, cough, chest pain, nausea, vomiting, change in bowel or urinary habits.  There are no other known modifying factors.  Past Medical History:  Diagnosis Date  . Anxiety   . Benign neoplasm of kidney, except pelvis   . Cellulitis of leg 03/09/2013 hosp   . Cerebrovascular disease   . Chronic low back pain   . Difficult intubation    per patient with hand surgery in 2016 at surgical center- lip was pinched and swollen after surgery- patient states dr Burney Gauze never stated a problem with intubation   . DJD (degenerative joint disease)   . Dyspnea    with exertion   . GERD (gastroesophageal reflux disease)    no meds  . Glaucoma   . Gout   . H/O hiatal hernia   . Hepatitis    hx of years ago   . Hx of colonic polyps   . Hypercholesteremia   . Hypertension   . Hypothyroid   . IBS (irritable bowel syndrome)   . Obesity   . OSA (obstructive sleep apnea)    NPSG 2009: AHI 20/h. CPAP - follows with [facility]  . Renal insufficiency    one kidney small and non funtioning  . Tubular adenoma 2000  . Venous insufficiency     Patient Active Problem List   Diagnosis Date Noted  . Left shoulder pain 11/28/2018  . Fatigue 02/08/2018  . Right shoulder pain 03/21/2017  . Vertigo 04/19/2016  . Routine general medical examination at a health care facility 06/15/2014  . Obstructive sleep apnea  11/11/2007  . GLAUCOMA 09/22/2007  . CKD (chronic kidney disease) stage 3, GFR 30-59 ml/min 03/21/2007  . Hypothyroidism 01/04/2007  . Hyperlipidemia 01/04/2007  . GOUT 01/04/2007  . Morbid obesity ([place]) 01/04/2007  . Essential hypertension 01/04/2007    Past Surgical History:  Procedure Laterality Date  . ABDOMINAL HYSTERECTOMY    . APPENDECTOMY    . CATARACT EXTRACTION, BILATERAL Bilateral 2017  . cataracts Bilateral   . COLONOSCOPY    . CYSTOSCOPY/RETROGRADE/URETEROSCOPY Bilateral 04/09/2017   Procedure: CYSTOSCOPY/RETROGRADE;  Surgeon: Raynelle Bring, MD;  Location: [facility];  Service: Urology;  Laterality: Bilateral;  ONLY NEEDS 30 MIN FOR PROCEDURE  . DILATION AND CURETTAGE OF UTERUS    . FUNCTIONAL ENDOSCOPIC SINUS SURGERY  05/2003   Dr. Ernesto Rutherford  . hysterctomy and ooperectomy  1970's  . MASS EXCISION  2003   lt  . MASS EXCISION Left 09/17/2013   Procedure: EXCISION MASS LEFT HAND;  Surgeon: Schuyler Amor, MD;  Location: [facility];  Service: Orthopedics;  Laterality: Left;  . thyroid lobectomy for a cyst  10/2000   Dr. Ernesto Rutherford  . TONSILLECTOMY       OB History   No obstetric history on file.     Family History  Problem Relation Age of  Onset  . Heart disease Mother   . Asthma Brother   . Throat cancer Brother 31  . Asthma Sister   . Brain cancer Sister 47  . Breast cancer Sister 85    Social History   Tobacco Use  . Smoking status: Never Smoker  . Smokeless tobacco: Never Used  Substance Use Topics  . Alcohol use: No    Alcohol/week: 0.0 standard drinks  . Drug use: No    Home Medications Prior to Admission medications   Medication Sig Start Date End Date Taking? Authorizing Provider  allopurinol (ZYLOPRIM) 100 MG tablet TAKE 1 TABLET (100 MG TOTAL) BY MOUTH DAILY. 06/10/18   Hoyt Koch, MD  aspirin 81 MG tablet Take 81 mg by mouth daily.      [provider]  atenolol (TENORMIN) 50 MG tablet TAKE 2 TABLETS DAILY  01/07/19   Hoyt Koch, MD  diclofenac sodium (VOLTAREN) 1 % GEL Apply 2 g topically 4 (four) times daily. 11/28/18   Hoyt Koch, MD  dorzolamide-timolol (COSOPT) 22.3-6.8 MG/ML ophthalmic solution Place 1 drop into both eyes 2 (two) times daily.    [provider]  latanoprost (XALATAN) 0.005 % ophthalmic solution Place 1 drop into both eyes at bedtime.     [provider]  levothyroxine (SYNTHROID) 50 MCG tablet TAKE 1 TABLET BY MOUTH DAILY BEFORE BREAKFAST 12/12/18   Hoyt Koch, MD  lisinopril (PRINIVIL,ZESTRIL) 5 MG tablet Take 5 mg by mouth 2 (two) times daily.     [provider]  simvastatin (ZOCOR) 20 MG tablet TAKE 1 TABLET DAILY AT 6 P.M. 01/29/19   Hoyt Koch, MD  triamterene-hydrochlorothiazide (DYAZIDE) 37.5-25 MG capsule TAKE 1 CAPSULE BY MOUTH EVERY DAY 02/12/19   Hoyt Koch, MD    Allergies    Amlodipine besylate, Clonidine, Diltiazem hcl, Klonopin [clonazepam], Tetracycline, Doxycycline hyclate, Hydralazine, Iodine, Labetalol hcl, Lidocaine, and Olmesartan medoxomil  Review of Systems   Review of Systems  All other systems reviewed and are negative.   Physical Exam Updated Vital Signs BP (!) 155/84   Pulse (!) 58   Temp 98.4 F (36.9 C) (Oral)   Resp 19   SpO2 97%   Physical Exam Vitals and nursing note reviewed.  Constitutional:      General: She is not in acute distress.    Appearance: She is well-developed. She is not ill-appearing, toxic-appearing or diaphoretic.  HENT:     Head: Normocephalic and atraumatic.     Nose: No congestion or rhinorrhea.     Mouth/Throat:     Mouth: Mucous membranes are moist.  Eyes:     Conjunctiva/sclera: Conjunctivae normal.     Pupils: Pupils are equal, round, and reactive to light.  Neck:     Trachea: Phonation normal.  Cardiovascular:     Rate and Rhythm: Normal rate and regular rhythm.  Pulmonary:     Effort: Pulmonary effort is normal.      Breath sounds: Normal breath sounds.  Chest:     Chest wall: No tenderness.  Abdominal:     General: There is no distension.     Palpations: Abdomen is soft.     Tenderness: There is no abdominal tenderness. There is no guarding.  Musculoskeletal:        General: Normal range of motion.     Cervical back: Normal range of motion and neck supple.  Skin:    General: Skin is warm and dry.  Neurological:  Mental Status: She is alert and oriented to person, place, and time.     Cranial Nerves: No cranial nerve deficit.     Sensory: No sensory deficit.     Motor: No abnormal muscle tone.     Coordination: Coordination normal.     Comments: No dysarthria or aphasia.  Psychiatric:        Mood and Affect: Mood normal.        Behavior: Behavior normal.        Thought Content: Thought content normal.        Judgment: Judgment normal.     ED Results / Procedures / Treatments   Labs (all labs ordered are listed, but only abnormal results are displayed) Labs Reviewed  BASIC METABOLIC PANEL - Abnormal; Notable for the following components:      Result Value   Glucose, Bld 111 (*)    Creatinine, Ser 1.34 (*)    GFR calc non Af Amer 36 (*)    GFR calc Af Amer 42 (*)    All other components within normal limits  CBC  TROPONIN I (HIGH SENSITIVITY)  TROPONIN I (HIGH SENSITIVITY)    EKG None  Radiology DG Chest 2 View  Result Date: 02/18/2019 CLINICAL DATA:  Pt having hypertension issues for about 5 days even on meds - not really having any other symptoms - hx of htn, renal issues, sleep apnea, hiatal hernia, GERD EXAM: CHEST - 2 VIEW COMPARISON:  12/08/2011 FINDINGS: Borderline enlargement of the cardiopericardial silhouette. No mediastinal or hilar masses. No evidence of adenopathy. Lungs are mildly hyperexpanded, but clear. No pleural effusion or pneumothorax. Skeletal structures are intact. IMPRESSION: No no acute cardiopulmonary disease. Electronically Signed   By: Lajean Manes  M.D.   On: 02/18/2019 16:25    Procedures Procedures (including critical care time)  Medications Ordered in ED Medications  hydrochlorothiazide (HYDRODIURIL) tablet 25 mg (25 mg Oral Given 02/18/19 1804)    ED Course  I have reviewed the triage vital signs and the nursing notes.  Pertinent labs & imaging results that were available during my care of the patient were reviewed by me and considered in my medical decision making (see chart for details).  Clinical Course as of Feb 18 2031  Tue Feb 18, 2019  2010 Troponin I (High Sensitivity) [EW]  2010 Normal  Troponin I (High Sensitivity) [EW]  2010 Normal  CBC [EW]  2010 Normal except glucose high, creatinine high, GFR low  Basic metabolic panel(!) [EW]  [id number] No infiltrate or CHF, images reviewed and interpreted by me   [EW]    Clinical Course User Index [EW] Daleen Bo, MD   MDM Rules/Calculators/A&P                       Patient Vitals for the past 24 hrs:  BP Temp Temp src Pulse Resp SpO2  02/18/19 2000 (!) 155/84 -- -- (!) 58 19 97 %  02/18/19 1930 -- -- -- (!) 57 (!) 22 93 %  02/18/19 1900 (!) 176/82 -- -- 61 16 98 %  02/18/19 1830 (!) 183/83 -- -- (!) 59 13 98 %  02/18/19 1800 (!) 190/92 -- -- (!) 58 (!) 24 95 %  02/18/19 1730 (!) 196/89 -- -- (!) 56 (!) 21 94 %  02/18/19 1700 (!) 194/87 -- -- (!) 57 19 99 %  02/18/19 1657 (!) 195/96 -- -- (!) 58 15 99 %  02/18/19 1544 (!) 192/89 98.4 F (  36.9 C) Oral 69 16 97 %    8:11 PM Reevaluation with update and discussion. After initial assessment and treatment, an updated evaluation reveals she has boarded in the ED, and her blood pressure improved at this time.  Discussed finding and treatment plan with patient.  She will follow up with renal regarding possible changes in her blood pressure treatment. Daleen Bo   Medical Decision Making: Nonspecific blood pressure elevation without signs and symptoms of hypertensive urgency.  Patient is recently started taking an  increased dose of lisinopril, 10 mg in the morning and 5 mg in the evening.  In the ED she was given hydrochlorothiazide  a dose of 25 mg.  Blood pressure monitored and  decreased.  Patient with mild renal insufficiency, increased from baseline, is at risk for further elevation of dose of lisinopril as well as change of diuretics.  She is stable for discharge with outpatient management of this.  She has access to care through the nephrology service.  TONJUA ABREGO was evaluated in Emergency Department on 02/18/2019 for the symptoms described in the history of present illness. She was evaluated in the context of the global COVID-19 pandemic, which necessitated consideration that the patient might be at risk for infection with the SARS-CoV-2 virus that causes COVID-19. Institutional protocols and algorithms that pertain to the evaluation of patients at risk for COVID-19 are in a state of rapid change based on information released by regulatory bodies including the CDC and federal and state organizations. These policies and algorithms were followed during the patient's care in the ED.   CRITICAL CARE-no Performed by: Daleen Bo  Nursing Notes Reviewed/ Care Coordinated Applicable Imaging Reviewed Interpretation of Laboratory Data incorporated into ED treatment  The patient appears reasonably screened and/or stabilized for discharge and I doubt any other medical condition or other [facility] requiring further screening, evaluation, or treatment in the ED at this time prior to discharge.  Plan: Home Medications-continue usual medicines; Home Treatments-low-salt diet; return here if the recommended treatment, does not improve the symptoms; Recommended follow up-follow-up with nephrology as soon as possible to discuss treatment plan and possible changing of medications, relative to blood pressure elevations, and mild renal insufficiency.  Final Clinical Impression(s) / ED Diagnoses Final diagnoses:   Hypertension, unspecified type    Rx / DC Orders ED Discharge Orders    None       Daleen Bo, MD 02/18/19 2032

## 2019-02-18 NOTE — ED Triage Notes (Signed)
Pt here for hypertension since Thursday. Called PCP at that time, they called her back yesterday and advised to increase Lisinopril dose to 10mg  in the am and 5 mg at night. Pt's bp still high today so came here for evaluation. Denies headache or chest pain.

## 2019-02-19 NOTE — Telephone Encounter (Signed)
Seems like new issue.  Please schedule ROV.  Can be with me or NP.

## 2019-02-19 NOTE — Telephone Encounter (Signed)
Called the patient and scheduled her for an appointment on 02/25/19 at 1:30 with Derl Barrow, NP per Dr. Halford Chessman.   Patient stated she went to the ER on 02/18/19 due to elevated blood pressure. Stated her kidney doctor placed her on medication for her pressure and not sure if that office has been in contact with her PCP Dr. Sharlet Salina. Will be discussed during office visit. Nothing further needed at this time.

## 2019-02-24 ENCOUNTER — Other Ambulatory Visit: Payer: Self-pay

## 2019-02-24 ENCOUNTER — Ambulatory Visit (INDEPENDENT_AMBULATORY_CARE_PROVIDER_SITE_OTHER): Payer: Medicare Other | Admitting: Internal Medicine

## 2019-02-24 ENCOUNTER — Encounter: Payer: Self-pay | Admitting: Internal Medicine

## 2019-02-24 VITALS — BP 140/90 | HR 69 | Temp 97.7°F | Ht 59.0 in | Wt 183.0 lb

## 2019-02-24 DIAGNOSIS — I10 Essential (primary) hypertension: Secondary | ICD-10-CM | POA: Diagnosis not present

## 2019-02-24 DIAGNOSIS — N1831 Chronic kidney disease, stage 3a: Secondary | ICD-10-CM

## 2019-02-24 NOTE — Assessment & Plan Note (Signed)
BP at goal today. She will keep lisinopril 10 mg qam and 5 mg qpm and hctz 25 mg daily and atenolol. Follow up with nephrology for repeat BMP in 4-6 weeks since ACE-I dose change. Counseled to work on regular exercise and low sodium diet. Advised to stop ACE-I for acute illness or dehydration.

## 2019-02-24 NOTE — Progress Notes (Signed)
   Subjective:   Patient ID: April Donaldson, female    DOB: [birth year], 85 y.o.   MRN: [id number]  HPI The patient is an 85 YO female coming in for ER follow up for blood pressure. She was having high BP numbers to 200/100. Called nephrology for advice and they advised she increase lisinopril. She did not get this information and BP rose so she went to ER. She does have allergies to many BP medications. Lisinopril increased to 10 mg morning and 5 mg night time. Denies headaches or chest pains. Her blood pressure is coming back down to normal. She did possibly have some more salty foods. Denies change in exercise and is not doing much. Denies cough or fevers or chills or SOB. Taking her atenolol, hctz, and lisinopril as prescribed. Is concerned about kidney function as she was not aware she had CKD stage 3 and this is a little scary to her.   PMH, [facility], social history reviewed and updated  Review of Systems  Constitutional: Negative.   HENT: Negative.   Eyes: Negative.   Respiratory: Negative for cough, chest tightness and shortness of breath.   Cardiovascular: Negative for chest pain, palpitations and leg swelling.  Gastrointestinal: Negative for abdominal distention, abdominal pain, constipation, diarrhea, nausea and vomiting.  Musculoskeletal: Negative.   Skin: Negative.   Neurological: Negative.   Psychiatric/Behavioral: Negative.     Objective:  Physical Exam Constitutional:      Appearance: She is well-developed. She is obese.  HENT:     Head: Normocephalic and atraumatic.  Cardiovascular:     Rate and Rhythm: Normal rate and regular rhythm.  Pulmonary:     Effort: Pulmonary effort is normal. No respiratory distress.     Breath sounds: Normal breath sounds. No wheezing or rales.  Abdominal:     General: Bowel sounds are normal. There is no distension.     Palpations: Abdomen is soft.     Tenderness: There is no abdominal tenderness. There is no rebound.  Musculoskeletal:   Cervical back: Normal range of motion.  Skin:    General: Skin is warm and dry.  Neurological:     Mental Status: She is alert and oriented to person, place, and time.     Coordination: Coordination normal.     Vitals:   02/24/19 0951  BP: 140/90  Pulse: 69  Temp: 97.7 F (36.5 C)  TempSrc: Oral  SpO2: 96%  Weight: 183 lb (83 kg)  Height: 4\' 11"  (1.499 m)    This visit occurred during the SARS-CoV-2 public health emergency.  Safety protocols were in place, including screening questions prior to the visit, additional usage of staff PPE, and extensive cleaning of exam room while observing appropriate contact time as indicated for disinfecting solutions.   Assessment & Plan:

## 2019-02-24 NOTE — Patient Instructions (Addendum)
We will have you see the kidney specialist about the lisinopril.   You can try claritin or zyrtec for the pressure.

## 2019-02-24 NOTE — Assessment & Plan Note (Signed)
Stable kidney function and talked to her about the stability of her renal function as well as CKD stage 3. Encouraged good BP control. On ACE-I and we talked about how this can help protect her renal function.

## 2019-02-25 ENCOUNTER — Encounter: Payer: Self-pay | Admitting: Primary Care

## 2019-02-25 ENCOUNTER — Ambulatory Visit (INDEPENDENT_AMBULATORY_CARE_PROVIDER_SITE_OTHER): Payer: Medicare Other | Admitting: Primary Care

## 2019-02-25 DIAGNOSIS — G4720 Circadian rhythm sleep disorder, unspecified type: Secondary | ICD-10-CM

## 2019-02-25 DIAGNOSIS — G4733 Obstructive sleep apnea (adult) (pediatric): Secondary | ICD-10-CM | POA: Diagnosis not present

## 2019-02-25 DIAGNOSIS — F518 Other sleep disorders not due to a substance or known physiological condition: Secondary | ICD-10-CM | POA: Diagnosis not present

## 2019-02-25 NOTE — Patient Instructions (Addendum)
Recommendations: Melatonin 5mg  at bedtime   Follow-up: Annually with Dr. Halford Chessman (due August 2021)

## 2019-02-25 NOTE — Progress Notes (Signed)
Virtual Visit via Telephone Note  I connected with April Donaldson on 02/25/19 at  1:30 PM EST by telephone and verified that I am speaking with the correct person using two identifiers.  Location: Patient: Home Provider: Office   I discussed the limitations, risks, security and privacy concerns of performing an evaluation and management service by telephone and the availability of in person appointments. I also discussed with the patient that there may be a patient responsible charge related to this service. The patient expressed understanding and agreed to proceed.   History of Present Illness: 85 year old female, never smoked. PMH significant for OSA on CPAP, hypertension, CKD stage 3, HLD, hypothyroidism. Patient of Dr. Halford Chessman, last seen on 10/10/18. Maintained on auto CPAP. Recommend annual follow-up.   02/25/2019 Patient contacted today for televisit, reports difficulty getting full night sleep for the last several weeks. She has always had some trouble falling asleep but recently she has been waking up soon after getting to sleep. She feels this could be related to recent changes in her blood pressure medications. She is not having any issues with pressure or mask fit.   Airview download: Usage 21/30 days avg 5hrs 102mins auto 5-15cmH2O AHI 0.3   Observations/Objective:  - No shortness of breath, wheezing or cough  Assessment and Plan:  OSA - No issues with mask fit or pressure setting - Compliant with CPAP - Pressure setting 5-15cm H20 - AHI 0.3  Disrupted sleep pattern: - Difficulty staying asleep  - Does not appear to be related to CPAP use - Recommend Melatonin 5mg  qhs    Follow Up Instructions:   - Fu with Dr. Halford Chessman in August 2021 or sooner if needed  I discussed the assessment and treatment plan with the patient. The patient was provided an opportunity to ask questions and all were answered. The patient agreed with the plan and demonstrated an understanding of the  instructions.   The patient was advised to call back or seek an in-person evaluation if the symptoms worsen or if the condition fails to improve as anticipated.  I provided 18 minutes of non-face-to-face time during this encounter.   Martyn Ehrich, NP

## 2019-02-26 ENCOUNTER — Encounter: Payer: Self-pay | Admitting: Primary Care

## 2019-03-03 NOTE — Progress Notes (Signed)
Reviewed and agree with assessment/plan.   Reigna Ruperto, MD [facility] 02/23/2016, 12:24 PM Pager:  [phone]  

## 2019-03-07 ENCOUNTER — Encounter: Payer: Self-pay | Admitting: Gastroenterology

## 2019-03-10 ENCOUNTER — Encounter: Payer: Self-pay | Admitting: *Deleted

## 2019-03-14 ENCOUNTER — Ambulatory Visit: Payer: Medicare Other | Admitting: Gastroenterology

## 2019-03-17 ENCOUNTER — Ambulatory Visit: Payer: Medicare Other

## 2019-03-19 ENCOUNTER — Telehealth: Payer: Self-pay | Admitting: Internal Medicine

## 2019-03-19 NOTE — Telephone Encounter (Signed)
Copied from [place] [phone]. Topic: General - Other >> Mar 19, 2019  2:19 PM Keene Breath wrote: Reason for CRM: Patient would like to speak with the doctor regarding her meds and BP for the COVID vaccine.  CB# [phone]

## 2019-03-20 NOTE — Telephone Encounter (Signed)
Yes, recommend vaccine for her.

## 2019-03-20 NOTE — Telephone Encounter (Signed)
I called pt- she wants to know if she is ok to take the COVID-19 vaccine with her current meds and medical history. She is scheduled 03/24/19 for vaccine. Please advise.

## 2019-03-20 NOTE — Telephone Encounter (Signed)
Pt informed of below.  

## 2019-04-20 ENCOUNTER — Other Ambulatory Visit: Payer: Self-pay

## 2019-04-20 ENCOUNTER — Encounter (HOSPITAL_COMMUNITY): Payer: Self-pay | Admitting: Emergency Medicine

## 2019-04-20 ENCOUNTER — Emergency Department (HOSPITAL_COMMUNITY)
Admission: EM | Admit: 2019-04-20 | Discharge: 2019-04-20 | Disposition: A | Discharge status: Home / Self Care | Payer: Medicare Other | Attending: Emergency Medicine | Admitting: Emergency Medicine

## 2019-04-20 DIAGNOSIS — N183 Chronic kidney disease, stage 3 unspecified: Secondary | ICD-10-CM | POA: Diagnosis not present

## 2019-04-20 DIAGNOSIS — I129 Hypertensive chronic kidney disease with stage 1 through stage 4 chronic kidney disease, or unspecified chronic kidney disease: Secondary | ICD-10-CM | POA: Diagnosis present

## 2019-04-20 DIAGNOSIS — I10 Essential (primary) hypertension: Secondary | ICD-10-CM

## 2019-04-20 LAB — BASIC METABOLIC PANEL
Anion gap: 11 (ref 5–15)
BUN: 18 mg/dL (ref 8–23)
CO2: 26 mmol/L (ref 22–32)
Calcium: 9.2 mg/dL (ref 8.9–10.3)
Chloride: 103 mmol/L (ref 98–111)
Creatinine, Ser: 1.12 mg/dL — ABNORMAL HIGH (ref 0.44–1.00)
GFR calc Af Amer: 52 mL/min — ABNORMAL LOW (ref >60)
GFR calc non Af Amer: 45 mL/min — ABNORMAL LOW (ref >60)
Glucose, Bld: 112 mg/dL — ABNORMAL HIGH (ref 70–99)
Potassium: 3.6 mmol/L (ref 3.5–5.1)
Sodium: 140 mmol/L (ref 135–145)

## 2019-04-20 LAB — CBC WITH DIFFERENTIAL/PLATELET
Abs Immature Granulocytes: 0.01 10*3/uL (ref 0.00–0.07)
Basophils Absolute: 0.1 10*3/uL (ref 0.0–0.1)
Basophils Relative: 1 %
Eosinophils Absolute: 0.1 10*3/uL (ref 0.0–0.5)
Eosinophils Relative: 2 %
HCT: 40.9 % (ref 36.0–46.0)
Hemoglobin: 13.3 g/dL (ref 12.0–15.0)
Immature Granulocytes: 0 %
Lymphocytes Relative: 39 %
Lymphs Abs: 3 10*3/uL (ref 0.7–4.0)
MCH: 29.8 pg (ref 26.0–34.0)
MCHC: 32.5 g/dL (ref 30.0–36.0)
MCV: 91.5 fL (ref 80.0–100.0)
Monocytes Absolute: 0.7 10*3/uL (ref 0.1–1.0)
Monocytes Relative: 9 %
Neutro Abs: 3.7 10*3/uL (ref 1.7–7.7)
Neutrophils Relative %: 49 %
Platelets: 304 10*3/uL (ref 150–400)
RBC: 4.47 MIL/uL (ref 3.87–5.11)
RDW: 13.2 % (ref 11.5–15.5)
WBC: 7.5 10*3/uL (ref 4.0–10.5)
nRBC: 0 % (ref 0.0–0.2)

## 2019-04-20 MED ORDER — TRIAMTERENE-HCTZ 37.5-25 MG PO TABS
1.0000 | ORAL_TABLET | Freq: Once | ORAL | Status: AC
  Administered 2019-04-20: 1 via ORAL
  Filled 2019-04-20: qty 1

## 2019-04-20 MED ORDER — ATENOLOL 50 MG PO TABS
100.0000 mg | ORAL_TABLET | Freq: Once | ORAL | Status: AC
  Administered 2019-04-20: 100 mg via ORAL
  Filled 2019-04-20: qty 2

## 2019-04-20 NOTE — ED Triage Notes (Signed)
Patient c/o hypertension this morning of 200/100. States she took her lisinopril 10mg  between 6-7 am. Had 2nd covid vaccine on the 15th. Denies any headache or blurred vision.

## 2019-04-20 NOTE — ED Provider Notes (Signed)
[facility] EMERGENCY DEPARTMENT Provider Note   CSN: [id number] Arrival date & time: 04/20/19  [id number]     History Chief Complaint  Patient presents with  . Hypertension    April Donaldson is a 86 y.o. female.  Presents to ER with chief complaint elevated blood pressure.  States that she periodically measures her blood pressure at home, has been measuring it more frequently since getting the Covid vaccine on 2/15.  Has been taking it near daily.  Yesterday seem to be a little bit higher than normal.  This morning blood pressure was 200/100.  She has no associated chest pain, back pain, shortness of breath, numbness, weakness, vision changes, headache.  Has no complaints.  States she tried calling her primary doctor's office nursing line but no one picked up.  Came to ER for further eval.  She has taken her lisinopril, has not taken her atenolol or HCTZ.  Past medical history hypertension, CKD, obesity  HPI     Past Medical History:  Diagnosis Date  . Anxiety   . Benign neoplasm of kidney, except pelvis   . Cellulitis of leg 03/09/2013 hosp   . Cerebrovascular disease   . Chronic low back pain   . Difficult intubation    per patient with hand surgery in 2016 at surgical center- lip was pinched and swollen after surgery- patient states dr Burney Gauze never stated a problem with intubation   . Diverticulosis   . DJD (degenerative joint disease)   . Dyspnea    with exertion   . GERD (gastroesophageal reflux disease)    no meds  . Glaucoma   . Gout   . H/O hiatal hernia   . Hepatitis    hx of years ago   . Hx of colonic polyps   . Hypercholesteremia   . Hypertension   . Hypothyroid   . IBS (irritable bowel syndrome)   . Internal hemorrhoids   . Obesity   . OSA (obstructive sleep apnea)    NPSG 2009: AHI 20/h. CPAP - follows with [facility]  . Renal insufficiency    one kidney small and non funtioning  . Tubular adenoma 2000  . Venous insufficiency      Patient Active Problem List   Diagnosis Date Noted  . Left shoulder pain 11/28/2018  . Fatigue 02/08/2018  . Right shoulder pain 03/21/2017  . Vertigo 04/19/2016  . Routine general medical examination at a health care facility 06/15/2014  . Obstructive sleep apnea 11/11/2007  . GLAUCOMA 09/22/2007  . CKD (chronic kidney disease) stage 3, GFR 30-59 ml/min 03/21/2007  . Hypothyroidism 01/04/2007  . Hyperlipidemia 01/04/2007  . GOUT 01/04/2007  . Morbid obesity ([place]) 01/04/2007  . Essential hypertension 01/04/2007    Past Surgical History:  Procedure Laterality Date  . ABDOMINAL HYSTERECTOMY    . APPENDECTOMY    . CATARACT EXTRACTION, BILATERAL Bilateral 2017  . cataracts Bilateral   . COLONOSCOPY    . CYSTOSCOPY/RETROGRADE/URETEROSCOPY Bilateral 04/09/2017   Procedure: CYSTOSCOPY/RETROGRADE;  Surgeon: Raynelle Bring, MD;  Location: [facility];  Service: Urology;  Laterality: Bilateral;  ONLY NEEDS 30 MIN FOR PROCEDURE  . DILATION AND CURETTAGE OF UTERUS    . FUNCTIONAL ENDOSCOPIC SINUS SURGERY  05/2003   Dr. Ernesto Rutherford  . hysterctomy and ooperectomy  1970's  . MASS EXCISION  2003   lt  . MASS EXCISION Left 09/17/2013   Procedure: EXCISION MASS LEFT HAND;  Surgeon: Schuyler Amor, MD;  Location: [facility];  Service: Orthopedics;  Laterality: Left;  . thyroid lobectomy for a cyst  10/2000   Dr. Ernesto Rutherford  . TONSILLECTOMY       OB History   No obstetric history on file.     Family History  Problem Relation Age of Onset  . Heart disease Mother   . Asthma Brother   . Throat cancer Brother 51  . Asthma Sister   . Brain cancer Sister 62  . Breast cancer Sister 20    Social History   Tobacco Use  . Smoking status: Never Smoker  . Smokeless tobacco: Never Used  Substance Use Topics  . Alcohol use: No    Alcohol/week: 0.0 standard drinks  . Drug use: No    Home Medications Prior to Admission medications   Medication Sig Start Date End Date Taking?  Authorizing Provider  allopurinol (ZYLOPRIM) 100 MG tablet TAKE 1 TABLET (100 MG TOTAL) BY MOUTH DAILY. 06/10/18   Hoyt Koch, MD  aspirin 81 MG tablet Take 81 mg by mouth daily.      [provider]  atenolol (TENORMIN) 50 MG tablet TAKE 2 TABLETS DAILY 01/07/19   Hoyt Koch, MD  diclofenac sodium (VOLTAREN) 1 % GEL Apply 2 g topically 4 (four) times daily. 11/28/18   Hoyt Koch, MD  dorzolamide-timolol (COSOPT) 22.3-6.8 MG/ML ophthalmic solution Place 1 drop into both eyes 2 (two) times daily.    [provider]  latanoprost (XALATAN) 0.005 % ophthalmic solution Place 1 drop into both eyes at bedtime.     [provider]  levothyroxine (SYNTHROID) 50 MCG tablet TAKE 1 TABLET BY MOUTH DAILY BEFORE BREAKFAST 12/12/18   Hoyt Koch, MD  lisinopril (PRINIVIL,ZESTRIL) 5 MG tablet Take 5 mg by mouth 2 (two) times daily.     [provider]  simvastatin (ZOCOR) 20 MG tablet TAKE 1 TABLET DAILY AT 6 P.M. 01/29/19   Hoyt Koch, MD  triamterene-hydrochlorothiazide (DYAZIDE) 37.5-25 MG capsule TAKE 1 CAPSULE BY MOUTH EVERY DAY 02/12/19   Hoyt Koch, MD    Allergies    Amlodipine besylate, Clonidine, Diltiazem hcl, Klonopin [clonazepam], Tetracycline, Doxycycline hyclate, Hydralazine, Iodine, Labetalol hcl, Lidocaine, and Olmesartan medoxomil  Review of Systems   Review of Systems  Constitutional: Negative for chills and fever.  HENT: Negative for ear pain and sore throat.   Eyes: Negative for pain and visual disturbance.  Respiratory: Negative for cough and shortness of breath.   Cardiovascular: Negative for chest pain and palpitations.  Gastrointestinal: Negative for abdominal pain and vomiting.  Genitourinary: Negative for dysuria and hematuria.  Musculoskeletal: Negative for arthralgias and back pain.  Skin: Negative for color change and rash.  Neurological: Negative for seizures and syncope.  All  other systems reviewed and are negative.   Physical Exam Updated Vital Signs BP (!) 159/81   Pulse (!) 56   Temp 97.8 F (36.6 C) (Oral)   Resp (!) 21   Ht 4\' 11"  (1.499 m)   Wt 83.5 kg   SpO2 97%   BMI 37.16 kg/m   Physical Exam Vitals and nursing note reviewed.  Constitutional:      General: She is not in acute distress.    Appearance: She is well-developed.  HENT:     Head: Normocephalic and atraumatic.  Eyes:     General: No visual field deficit.    Conjunctiva/sclera: Conjunctivae normal.  Cardiovascular:     Rate and Rhythm: Normal rate and regular rhythm.  Heart sounds: No murmur.  Pulmonary:     Effort: Pulmonary effort is normal. No respiratory distress.     Breath sounds: Normal breath sounds.  Abdominal:     Palpations: Abdomen is soft.     Tenderness: There is no abdominal tenderness.  Musculoskeletal:     Cervical back: Neck supple.  Skin:    General: Skin is warm and dry.  Neurological:     General: No focal deficit present.     Mental Status: She is alert and oriented to person, place, and time.     Cranial Nerves: Cranial nerves are intact. No cranial nerve deficit, dysarthria or facial asymmetry.     Sensory: Sensation is intact. No sensory deficit.     Motor: Motor function is intact. No weakness.     Coordination: Coordination is intact. Finger-Nose-Finger Test normal.     Gait: Gait is intact.  Psychiatric:        Mood and Affect: Mood normal.        Behavior: Behavior normal.        Thought Content: Thought content normal.     ED Results / Procedures / Treatments   Labs (all labs ordered are listed, but only abnormal results are displayed) Labs Reviewed  BASIC METABOLIC PANEL - Abnormal; Notable for the following components:      Result Value   Glucose, Bld 112 (*)    Creatinine, Ser 1.12 (*)    GFR calc non Af Amer 45 (*)    GFR calc Af Amer 52 (*)    All other components within normal limits  CBC WITH DIFFERENTIAL/PLATELET    URINALYSIS, ROUTINE W REFLEX MICROSCOPIC    EKG EKG Interpretation  Date/Time:  Sunday April 20 2019 09:46:52 EST Ventricular Rate:  63 PR Interval:    QRS Duration: 86 QT Interval:  413 QTC Calculation: 423 R Axis:   -26 Text Interpretation: Sinus rhythm Borderline left axis deviation Low voltage, precordial leads Confirmed by ,  ([id number]) on 04/20/2019 10:27:51 AM   Radiology No results found.  Procedures Procedures (including critical care time)  Medications Ordered in ED Medications  triamterene-hydrochlorothiazide (MAXZIDE-25) 37.5-25 MG per tablet 1 tablet (1 tablet Oral Given 04/20/19 1022)  atenolol (TENORMIN) tablet 100 mg (100 mg Oral Given 04/20/19 1023)    ED Course  I have reviewed the triage vital signs and the nursing notes.  Pertinent labs & imaging results that were available during my care of the patient were reviewed by me and considered in my medical decision making (see chart for details).  Clinical Course as of Apr 20 1535  Sun Apr 20, 2019  1113 Rechecked, labs improved   [RD]    Clinical Course User Index [RD] ,  S, MD   MDM Rules/Calculators/A&P                      87  year old lady presented to ER with asymptomatic hypertension.  Long history hypertension on multiple antihypertensives.  Provided patient her home BP meds.  Labs stable, EKG unremarkable.  Normal physical exam, normal neurologic exam.  Recommend patient have recheck of blood pressure in PCP office, follow-up with PCP regarding ongoing management of her hypertension.    After the discussed management above, the patient was determined to be safe for discharge.  The patient was in agreement with this plan and all questions regarding their care were answered.  ED return precautions were discussed and the patient will return to the ED  with any significant worsening of condition.   Final Clinical Impression(s) / ED Diagnoses Final diagnoses:   Asymptomatic hypertension    Rx / DC Orders ED Discharge Orders    None       Lucrezia Starch, MD 04/20/19 1537

## 2019-04-20 NOTE — Discharge Instructions (Addendum)
Please call your primary doctor to get an appointment this coming week regarding your blood pressure.  Recommend continuing your current prescriptions for blood pressure medicines.  If you develop chest pain, difficulty breathing, headaches, numbness, vision changes or other new concerning symptom recommend return to ER for reassessment.

## 2019-04-22 ENCOUNTER — Ambulatory Visit (INDEPENDENT_AMBULATORY_CARE_PROVIDER_SITE_OTHER): Payer: Medicare Other | Admitting: Internal Medicine

## 2019-04-22 ENCOUNTER — Encounter: Payer: Self-pay | Admitting: Internal Medicine

## 2019-04-22 ENCOUNTER — Other Ambulatory Visit: Payer: Self-pay

## 2019-04-22 DIAGNOSIS — I10 Essential (primary) hypertension: Secondary | ICD-10-CM

## 2019-04-22 DIAGNOSIS — N1831 Chronic kidney disease, stage 3a: Secondary | ICD-10-CM | POA: Diagnosis not present

## 2019-04-22 NOTE — Progress Notes (Signed)
   Subjective:   Patient ID: April Donaldson, female    DOB: 1931-06-14, 86 y.o.   MRN: [id number]  HPI The patient is an 86 YO female coming in for ER follow up (in for high blood pressure, given her home meds and BP normalized, she did have EKG and labs without abnormality). This is her second visit for similar in the last couple of months. After last visit we had increased her lisinopril to 10 mg morning, 5 mg evening. She is still taking that and her HCTZ and atenolol. Denies missing doses of medication. Was feeling well even prior to going to ER. She is having readings at home which are better overall but some elevated readings. Denies headache or chest pains. Denies abdominal pain, nausea, vomiting.   PMH, [facility], social history reviewed and updated  Review of Systems  Constitutional: Negative.   HENT: Negative.   Eyes: Negative.   Respiratory: Negative for cough, chest tightness and shortness of breath.   Cardiovascular: Negative for chest pain, palpitations and leg swelling.  Gastrointestinal: Negative for abdominal distention, abdominal pain, constipation, diarrhea, nausea and vomiting.  Musculoskeletal: Negative.   Skin: Negative.   Neurological: Negative.   Psychiatric/Behavioral: Negative.     Objective:  Physical Exam Constitutional:      Appearance: She is well-developed. She is obese.  HENT:     Head: Normocephalic and atraumatic.  Cardiovascular:     Rate and Rhythm: Normal rate and regular rhythm.  Pulmonary:     Effort: Pulmonary effort is normal. No respiratory distress.     Breath sounds: Normal breath sounds. No wheezing or rales.  Abdominal:     General: Bowel sounds are normal. There is no distension.     Palpations: Abdomen is soft.     Tenderness: There is no abdominal tenderness. There is no rebound.  Musculoskeletal:     Cervical back: Normal range of motion.  Skin:    General: Skin is warm and dry.  Neurological:     Mental Status: She is alert and  oriented to person, place, and time.     Coordination: Coordination normal.     Vitals:   04/22/19 1542 04/22/19 1612  BP: (!) 172/94 (!) 142/94  Pulse: 60   Temp: 98.6 F (37 C)   TempSrc: Oral   SpO2: 96%   Weight: 186 lb 2 oz (84.4 kg)   Height: 4\' 11"  (1.499 m)     This visit occurred during the SARS-CoV-2 public health emergency.  Safety protocols were in place, including screening questions prior to the visit, additional usage of staff PPE, and extensive cleaning of exam room while observing appropriate contact time as indicated for disinfecting solutions.   Assessment & Plan:  Visit time 20 minutes in face to face communication with patient and coordination of care, additional 10 minutes spent in record review, coordination or care, ordering tests, communicating/referring to other healthcare professionals, documenting in medical records all on the same day of the visit for total time 30 minutes spent on the visit.

## 2019-04-22 NOTE — Patient Instructions (Signed)
I would try some magnesium over the counter to help the feet.   We will monitor the blood pressure and call us if this is staying up.

## 2019-04-24 NOTE — Assessment & Plan Note (Signed)
Stable labs at ER recently.

## 2019-04-24 NOTE — Assessment & Plan Note (Addendum)
BP elevated with recheck close to goal. She will continue to follow up with nephrology who is helping to manage her BP given her multiple allergies/intolerances to medications. Continue hctz, atenolol, lisinopril. Advised to avoid dietary sodium as this was the trigger last time her pressures were inconsistent and elevating.

## 2019-05-05 ENCOUNTER — Encounter (INDEPENDENT_AMBULATORY_CARE_PROVIDER_SITE_OTHER): Payer: Self-pay | Admitting: Otolaryngology

## 2019-05-05 ENCOUNTER — Ambulatory Visit (INDEPENDENT_AMBULATORY_CARE_PROVIDER_SITE_OTHER): Payer: Medicare Other | Admitting: Otolaryngology

## 2019-05-05 ENCOUNTER — Other Ambulatory Visit: Payer: Self-pay

## 2019-05-05 VITALS — Temp 97.7°F

## 2019-05-05 DIAGNOSIS — J310 Chronic rhinitis: Secondary | ICD-10-CM

## 2019-05-05 NOTE — Progress Notes (Signed)
HPI: April Donaldson is a 86 y.o. female who presents is referred by her PCP for evaluation of sinus and ear complaints.  She has had previous surgery with Dr. Ernesto Rutherford over 15 years ago.  She was last seen by Dr. Ernesto Rutherford about a year ago.  She has had some right-sided headaches and wanted her sinuses checked.  She has also has some intermittent fullness in the right ear.  She has not noted any hearing problems. She has history of hypertension and cannot take decongestant medication.  She also has history of glaucoma and cannot use nasal steroid sprays.  She does use saline irrigations. She had an MRI scan in 2017 that showed clear paranasal sinuses as well as no other intracranial abnormality for work-up of vertigo at that time that was classified as BPPV.Marland Kitchen Her last audiogram in 2014 demonstrated normal hearing in both ears with SRT's of 15 dB bilaterally..  Past Medical History:  Diagnosis Date  . Anxiety   . Benign neoplasm of kidney, except pelvis   . Cellulitis of leg 03/09/2013 hosp   . Cerebrovascular disease   . Chronic low back pain   . Difficult intubation    per patient with hand surgery in 2016 at surgical center- lip was pinched and swollen after surgery- patient states dr Burney Gauze never stated a problem with intubation   . Diverticulosis   . DJD (degenerative joint disease)   . Dyspnea    with exertion   . GERD (gastroesophageal reflux disease)    no meds  . Glaucoma   . Gout   . H/O hiatal hernia   . Hepatitis    hx of years ago   . Hx of colonic polyps   . Hypercholesteremia   . Hypertension   . Hypothyroid   . IBS (irritable bowel syndrome)   . Internal hemorrhoids   . Obesity   . OSA (obstructive sleep apnea)    NPSG 2009: AHI 20/h. CPAP - follows with [facility]  . Renal insufficiency    one kidney small and non funtioning  . Tubular adenoma 2000  . Venous insufficiency    Past Surgical History:  Procedure Laterality Date  . ABDOMINAL HYSTERECTOMY    .  APPENDECTOMY    . CATARACT EXTRACTION, BILATERAL Bilateral 2017  . cataracts Bilateral   . COLONOSCOPY    . CYSTOSCOPY/RETROGRADE/URETEROSCOPY Bilateral 04/09/2017   Procedure: CYSTOSCOPY/RETROGRADE;  Surgeon: Raynelle Bring, MD;  Location: [facility];  Service: Urology;  Laterality: Bilateral;  ONLY NEEDS 30 MIN FOR PROCEDURE  . DILATION AND CURETTAGE OF UTERUS    . FUNCTIONAL ENDOSCOPIC SINUS SURGERY  05/2003   Dr. Ernesto Rutherford  . hysterctomy and ooperectomy  1970's  . MASS EXCISION  2003   lt  . MASS EXCISION Left 09/17/2013   Procedure: EXCISION MASS LEFT HAND;  Surgeon: Schuyler Amor, MD;  Location: [facility];  Service: Orthopedics;  Laterality: Left;  . thyroid lobectomy for a cyst  10/2000   Dr. Ernesto Rutherford  . TONSILLECTOMY     Social History   Socioeconomic History  . Marital status: Single    Spouse name: Not on file  . Number of children: 1  . Years of education: Not on file  . Highest education level: Not on file  Occupational History  . Occupation: retired from worked in Engineer, manufacturing systems: RETIRED  Tobacco Use  . Smoking status: Never Smoker  . Smokeless tobacco: Never Used  Substance and Sexual Activity  .  Alcohol use: No    Alcohol/week: 0.0 standard drinks  . Drug use: No  . Sexual activity: Never  Other Topics Concern  . Not on file  Social History Narrative   Lives alone, indep - never married   g-dtr in [place], siblings, nieces in [place] in touch with a few friends who talk regularly   Social Determinants of Health   Financial Resource Strain:   . Difficulty of Paying Living Expenses: Not on file  Food Insecurity:   . Worried About Charity fundraiser in the Last Year: Not on file  . Ran Out of Food in the Last Year: Not on file  Transportation Needs:   . Lack of Transportation (Medical): Not on file  . Lack of Transportation (Non-Medical): Not on file  Physical Activity:   . Days of Exercise per Week: Not on file  .  Minutes of Exercise per Session: Not on file  Stress:   . Feeling of Stress : Not on file  Social Connections:   . Frequency of Communication with Friends and Family: Not on file  . Frequency of Social Gatherings with Friends and Family: Not on file  . Attends Religious Services: Not on file  . Active Member of Clubs or Organizations: Not on file  . Attends Archivist Meetings: Not on file  . Marital Status: Not on file   Family History  Problem Relation Age of Onset  . Heart disease Mother   . Asthma Brother   . Throat cancer Brother 81  . Asthma Sister   . Brain cancer Sister 26  . Breast cancer Sister 64   Allergies  Allergen Reactions  . Amlodipine Besylate Swelling and Other (See Comments)    Causes swelling  . Clonidine Nausea Only and Other (See Comments)    Sweating, felt faint  . Diltiazem Hcl Swelling and Other (See Comments)    meds did not work for her----causes swelling  . Klonopin [Clonazepam] Other (See Comments)    Vivid dreams  . Tetracycline Swelling  . Doxycycline Hyclate Other (See Comments)    REACTION:  tetracycline  . Hydralazine Rash  . Iodine Other (See Comments)    IVP contrast Pt. States topical iodine is fine to use without any irritation. H.Mickley RN  . Labetalol Hcl Other (See Comments)    Unknown  . Lidocaine Rash  . Olmesartan Medoxomil Other (See Comments)    Unknown   Prior to Admission medications   Medication Sig Start Date End Date Taking? Authorizing Provider  allopurinol (ZYLOPRIM) 100 MG tablet TAKE 1 TABLET (100 MG TOTAL) BY MOUTH DAILY. 06/10/18  Yes Hoyt Koch, MD  aspirin 81 MG tablet Take 81 mg by mouth daily.     Yes [provider]  atenolol (TENORMIN) 50 MG tablet TAKE 2 TABLETS DAILY 01/07/19  Yes Hoyt Koch, MD  diclofenac sodium (VOLTAREN) 1 % GEL Apply 2 g topically 4 (four) times daily. 11/28/18  Yes Hoyt Koch, MD  dorzolamide-timolol (COSOPT) 22.3-6.8 MG/ML  ophthalmic solution Place 1 drop into both eyes 2 (two) times daily.   Yes [provider]  latanoprost (XALATAN) 0.005 % ophthalmic solution Place 1 drop into both eyes at bedtime.    Yes [provider]  levothyroxine (SYNTHROID) 50 MCG tablet TAKE 1 TABLET BY MOUTH DAILY BEFORE BREAKFAST 12/12/18  Yes Hoyt Koch, MD  lisinopril (PRINIVIL,ZESTRIL) 5 MG tablet Take 5 mg by mouth 2 (two) times daily.  Yes [provider]  lisinopril (ZESTRIL) 10 MG tablet  02/14/19  Yes [provider]  simvastatin (ZOCOR) 20 MG tablet TAKE 1 TABLET DAILY AT 6 P.M. 01/29/19  Yes Hoyt Koch, MD  triamterene-hydrochlorothiazide (DYAZIDE) 37.5-25 MG capsule TAKE 1 CAPSULE BY MOUTH EVERY DAY 02/12/19  Yes Hoyt Koch, MD     Positive ROS: Otherwise negative.  No recent vertigo.  All other systems have been reviewed and were otherwise negative with the exception of those mentioned in the HPI and as above.  Physical Exam: Constitutional: Alert, well-appearing, no acute distress Ears: External ears without lesions or tenderness. Ear canals are clear bilaterally with intact, clear TMs bilaterally.  On hearing screening with a 512 1024 tuning fork she heard well in both ears with AC > BC bilaterally. Nasal: External nose without lesions. Septum mildly deviated to the right with mild rhinitis and clear mucus discharge..  Nasal endoscopy was performed today.  Both middle meatus regions were clear with no obstruction or mucopurulent discharge noted.  Maxillary ostia were patent with no drainage noted.  The scope was placed in the right middle meatus looking superiorly and no obvious drainage noted.  She does have mild rhinitis on both sides with mild mucosal swelling but no signs of infection. Oral: Lips and gums without lesions. Tongue and palate mucosa without lesions. Posterior oropharynx clear. Neck: No palpable adenopathy or masses Respiratory:  Breathing comfortably  Skin: No facial/neck lesions or rash noted.  Nasal/sinus endoscopy  Date/Time: 05/05/2019 6:11 PM Performed by: Rozetta Nunnery, MD Authorized by: Rozetta Nunnery, MD   Consent:    Consent obtained:  Verbal   Risks discussed:  Pain Procedure details:    Indications: sino-nasal symptoms     Instrument: flexible fiberoptic nasal endoscope     Scope location: bilateral nare   Sinus:    Right middle meatus: normal     Left middle meatus: normal     Right nasopharynx: normal     Left nasopharynx: normal   Comments:     On nasal endoscopy both middle meatus regions were clear.  No signs of infection noted.  Mild rhinitis.    Assessment: Chronic rhinitis  Plan: Reassure of normal sinus evaluation on clinical nasal endoscopy. She will follow-up as needed. Suggested using Tylenol or ibuprofen as needed right-sided headaches.   Radene Journey, MD   CC:

## 2019-06-03 ENCOUNTER — Other Ambulatory Visit: Payer: Self-pay | Admitting: Internal Medicine

## 2019-06-04 ENCOUNTER — Other Ambulatory Visit: Payer: Self-pay

## 2019-06-04 ENCOUNTER — Encounter: Payer: Self-pay | Admitting: Family Medicine

## 2019-06-04 ENCOUNTER — Ambulatory Visit: Payer: Self-pay

## 2019-06-04 ENCOUNTER — Ambulatory Visit (INDEPENDENT_AMBULATORY_CARE_PROVIDER_SITE_OTHER): Payer: Medicare Other | Admitting: Family Medicine

## 2019-06-04 DIAGNOSIS — G8929 Other chronic pain: Secondary | ICD-10-CM

## 2019-06-04 DIAGNOSIS — M48061 Spinal stenosis, lumbar region without neurogenic claudication: Secondary | ICD-10-CM | POA: Diagnosis not present

## 2019-06-04 DIAGNOSIS — M545 Low back pain, unspecified: Secondary | ICD-10-CM

## 2019-06-04 NOTE — Progress Notes (Signed)
Office Visit Note   Patient: April Donaldson           Date of Birth: 10/20/1931           MRN: [id number] Visit Date: 06/04/2019 Requested by: Hoyt Koch, MD [address] [place],  [place] [address] PCP: Hoyt Koch, MD  Subjective: Chief Complaint  Patient presents with  . Lower Back - Pain    Chronic low back pain. Been seeing the chiropractor - patient has hit a plateau in improvement. Here to be reevaluated.    HPI: She is here with low back and right leg pain.  Longstanding problems with her back, I last saw her in 2013 and she had an MRI scan showing stenosis at multiple levels.  She has done very well with chiropractic treatment per Dr. Noberto Retort.  In the last year the treatments are not lasting quite as long, she feels that she has reached a plateau and would like to have some additional evaluation.  She is comfortable with sitting, but has pain down the right leg when she lies on her right side at night.  It also bothers her when she stands too long or when she gets on the ground to scrub the floors.  Denies bowel or bladder dysfunction, denies any weakness in her leg.  She does not like to take medication, she only has 1 kidney.  Denies fevers or chills.              ROS:   All other systems were reviewed and are negative.  Objective: Vital Signs: There were no vitals taken for this visit.  Physical Exam:  General:  Alert and oriented, in no acute distress. Pulm:  Breathing unlabored. Psy:  Normal mood, congruent affect.  Low back: She has no significant midline tenderness today, no tenderness over the SI joints.  Mild pain in the right sciatic notch and mild tenderness over both greater trochanter regions.  Good range of motion and no pain with internal and external hip rotation.  Negative straight leg raise, lower extremity strength and reflexes are normal and equal.  Imaging: X-rays lumbar spine: She has moderate degenerative disc disease at L1-2 and  L2-3, moderate to severe at L3-4, L4-5 and L5-S1.  SI joints are unremarkable, hip joints are well-preserved.  No sign of neoplasm or compression fracture.  Assessment & Plan: 1.  Chronic low back pain with history of stenosis, mostly right-sided symptoms. -Discussed options with her and elected to proceed with a new MRI scan.  Based on the results, could consider referral for epidural injections, trial of lumbar back brace, or possibly a trial of gabapentin taken at night.     Procedures: No procedures performed  No notes on file     PMFS History: Patient Active Problem List   Diagnosis Date Noted  . Left shoulder pain 11/28/2018  . Hordeolum externum of right upper eyelid 08/28/2018  . Fatigue 02/08/2018  . Right shoulder pain 03/21/2017  . Vertigo 04/19/2016  . Bilateral pseudophakia 04/10/2016  . Routine general medical examination at a health care facility 06/15/2014  . Primary open angle glaucoma of both eyes, mild stage 07/10/2011  . Obstructive sleep apnea 11/11/2007  . GLAUCOMA 09/22/2007  . CKD (chronic kidney disease) stage 3, GFR 30-59 ml/min 03/21/2007  . Hypothyroidism 01/04/2007  . Hyperlipidemia 01/04/2007  . GOUT 01/04/2007  . Morbid obesity ([place]) 01/04/2007  . Essential hypertension 01/04/2007   Past Medical History:  Diagnosis Date  .  Anxiety   . Benign neoplasm of kidney, except pelvis   . Cellulitis of leg 03/09/2013 hosp   . Cerebrovascular disease   . Chronic low back pain   . Difficult intubation    per patient with hand surgery in 2016 at surgical center- lip was pinched and swollen after surgery- patient states dr Burney Gauze never stated a problem with intubation   . Diverticulosis   . DJD (degenerative joint disease)   . Dyspnea    with exertion   . GERD (gastroesophageal reflux disease)    no meds  . Glaucoma   . Gout   . H/O hiatal hernia   . Hepatitis    hx of years ago   . Hx of colonic polyps   . Hypercholesteremia   . Hypertension    . Hypothyroid   . IBS (irritable bowel syndrome)   . Internal hemorrhoids   . Obesity   . OSA (obstructive sleep apnea)    NPSG 2009: AHI 20/h. CPAP - follows with [facility]  . Renal insufficiency    one kidney small and non funtioning  . Tubular adenoma 2000  . Venous insufficiency     Family History  Problem Relation Age of Onset  . Heart disease Mother   . Asthma Brother   . Throat cancer Brother 73  . Asthma Sister   . Brain cancer Sister 37  . Breast cancer Sister 18    Past Surgical History:  Procedure Laterality Date  . ABDOMINAL HYSTERECTOMY    . APPENDECTOMY    . CATARACT EXTRACTION, BILATERAL Bilateral 2017  . cataracts Bilateral   . COLONOSCOPY    . CYSTOSCOPY/RETROGRADE/URETEROSCOPY Bilateral 04/09/2017   Procedure: CYSTOSCOPY/RETROGRADE;  Surgeon: Raynelle Bring, MD;  Location: [facility];  Service: Urology;  Laterality: Bilateral;  ONLY NEEDS 30 MIN FOR PROCEDURE  . DILATION AND CURETTAGE OF UTERUS    . FUNCTIONAL ENDOSCOPIC SINUS SURGERY  05/2003   Dr. Ernesto Rutherford  . hysterctomy and ooperectomy  1970's  . MASS EXCISION  2003   lt  . MASS EXCISION Left 09/17/2013   Procedure: EXCISION MASS LEFT HAND;  Surgeon: Schuyler Amor, MD;  Location: [facility];  Service: Orthopedics;  Laterality: Left;  . thyroid lobectomy for a cyst  10/2000   Dr. Ernesto Rutherford  . TONSILLECTOMY     Social History   Occupational History  . Occupation: retired from worked in Engineer, manufacturing systems: RETIRED  Tobacco Use  . Smoking status: Never Smoker  . Smokeless tobacco: Never Used  Substance and Sexual Activity  . Alcohol use: No    Alcohol/week: 0.0 standard drinks  . Drug use: No  . Sexual activity: Never

## 2019-06-24 ENCOUNTER — Encounter: Payer: Self-pay | Admitting: Internal Medicine

## 2019-06-24 ENCOUNTER — Telehealth: Payer: Self-pay | Admitting: Internal Medicine

## 2019-06-24 NOTE — Telephone Encounter (Signed)
Patient called states she I snot feeling well she is not eating and is seeking advise

## 2019-06-24 NOTE — Telephone Encounter (Signed)
Left message for pt to call back to schedule an office visit as pt has not been seen in several years.

## 2019-06-25 ENCOUNTER — Other Ambulatory Visit: Payer: Self-pay

## 2019-06-25 ENCOUNTER — Encounter: Payer: Self-pay | Admitting: Internal Medicine

## 2019-06-25 ENCOUNTER — Ambulatory Visit (INDEPENDENT_AMBULATORY_CARE_PROVIDER_SITE_OTHER): Payer: Medicare Other | Admitting: Internal Medicine

## 2019-06-25 VITALS — BP 142/84 | HR 60 | Temp 98.1°F | Ht 59.0 in | Wt 186.6 lb

## 2019-06-25 DIAGNOSIS — R202 Paresthesia of skin: Secondary | ICD-10-CM | POA: Diagnosis not present

## 2019-06-25 DIAGNOSIS — E039 Hypothyroidism, unspecified: Secondary | ICD-10-CM | POA: Diagnosis not present

## 2019-06-25 DIAGNOSIS — I10 Essential (primary) hypertension: Secondary | ICD-10-CM | POA: Diagnosis not present

## 2019-06-25 DIAGNOSIS — B370 Candidal stomatitis: Secondary | ICD-10-CM | POA: Diagnosis not present

## 2019-06-25 DIAGNOSIS — R200 Anesthesia of skin: Secondary | ICD-10-CM | POA: Diagnosis not present

## 2019-06-25 LAB — COMPREHENSIVE METABOLIC PANEL
ALT: 13 U/L (ref 0–35)
AST: 19 U/L (ref 0–37)
Albumin: 4.2 g/dL (ref 3.5–5.2)
Alkaline Phosphatase: 59 U/L (ref 39–117)
BUN: 18 mg/dL (ref 6–23)
CO2: 29 mEq/L (ref 19–32)
Calcium: 9.2 mg/dL (ref 8.4–10.5)
Chloride: 103 mEq/L (ref 96–112)
Creatinine, Ser: 1.11 mg/dL (ref 0.40–1.20)
GFR: 56.47 mL/min — ABNORMAL LOW (ref >60.00)
Glucose, Bld: 87 mg/dL (ref 70–99)
Potassium: 3.7 mEq/L (ref 3.5–5.1)
Sodium: 140 mEq/L (ref 135–145)
Total Bilirubin: 0.5 mg/dL (ref 0.2–1.2)
Total Protein: 7.5 g/dL (ref 6.0–8.3)

## 2019-06-25 LAB — VITAMIN B12: Vitamin B-12: 496 pg/mL (ref 211–911)

## 2019-06-25 LAB — TSH: TSH: 4.2 u[IU]/mL (ref 0.35–4.50)

## 2019-06-25 LAB — VITAMIN D 25 HYDROXY (VIT D DEFICIENCY, FRACTURES): VITD: 53.65 ng/mL (ref 30.00–100.00)

## 2019-06-25 LAB — HEMOGLOBIN A1C: Hgb A1c MFr Bld: 5.3 % (ref 4.6–6.5)

## 2019-06-25 MED ORDER — NYSTATIN 100000 UNIT/ML MT SUSP: 5.0000 mL | Freq: Three times a day (TID) | OROMUCOSAL | 0 refills | Status: AC

## 2019-06-25 NOTE — Patient Instructions (Signed)
We have sent in magic mouthwash to use 5 mL 3 times a day for 5 days.

## 2019-06-25 NOTE — Progress Notes (Signed)
   Subjective:   Patient ID: April Donaldson, female    DOB: 01-11-1932, 85 y.o.   MRN: [id number]  HPI The patient is an 86 YO female coming in for several concerns including plaque on her tongue (noticed it a week or two ago, uses OSA and having more allergy drainage, feels that it is worse in the morning, doing more mouth breathing, does build up slightly throughout the day) and numbness and tingling left hand (prior mass excision left hand 2015, this started in the last several weeks to months, denies injury or overuse, sometimes feels more like the fingers will move on their own and do what they want, mild tingling) and her thyroid (taking synthroid, denies missing doses, takes separate from other meds and foods, denies cold or heat intolerance)  Review of Systems  Constitutional: Negative.   HENT: Negative.        Plaque on tongue  Eyes: Negative.   Respiratory: Negative for cough, chest tightness and shortness of breath.   Cardiovascular: Negative for chest pain, palpitations and leg swelling.  Gastrointestinal: Negative for abdominal distention, abdominal pain, constipation, diarrhea, nausea and vomiting.  Musculoskeletal: Negative.   Skin: Negative.   Neurological: Positive for numbness.  Psychiatric/Behavioral: Negative.     Objective:  Physical Exam Constitutional:      Appearance: She is well-developed.  HENT:     Head: Normocephalic and atraumatic.     Comments: There is an easily removable white plaque on the tongue which is not thick Cardiovascular:     Rate and Rhythm: Normal rate and regular rhythm.  Pulmonary:     Effort: Pulmonary effort is normal. No respiratory distress.     Breath sounds: Normal breath sounds. No wheezing or rales.  Abdominal:     General: Bowel sounds are normal. There is no distension.     Palpations: Abdomen is soft.     Tenderness: There is no abdominal tenderness. There is no rebound.  Musculoskeletal:     Cervical back: Normal range of  motion.  Skin:    General: Skin is warm and dry.  Neurological:     General: No focal deficit present.     Mental Status: She is alert and oriented to person, place, and time.     Coordination: Coordination normal.     Vitals:   06/25/19 1053  BP: (!) 142/84  Pulse: 60  Temp: 98.1 F (36.7 C)  SpO2: 96%  Weight: 186 lb 9.6 oz (84.6 kg)  Height: 4\' 11"  (1.499 m)    This visit occurred during the SARS-CoV-2 public health emergency.  Safety protocols were in place, including screening questions prior to the visit, additional usage of staff PPE, and extensive cleaning of exam room while observing appropriate contact time as indicated for disinfecting solutions.   Assessment & Plan:

## 2019-06-26 DIAGNOSIS — R202 Paresthesia of skin: Secondary | ICD-10-CM | POA: Insufficient documentation

## 2019-06-26 DIAGNOSIS — B370 Candidal stomatitis: Secondary | ICD-10-CM | POA: Insufficient documentation

## 2019-06-26 DIAGNOSIS — R200 Anesthesia of skin: Secondary | ICD-10-CM | POA: Insufficient documentation

## 2019-06-26 LAB — CBC
HCT: 40.6 % (ref 36.0–46.0)
Hemoglobin: 13.5 g/dL (ref 12.0–15.0)
MCHC: 33.3 g/dL (ref 30.0–36.0)
MCV: 90.3 fl (ref 78.0–100.0)
Platelets: 273 10*3/uL (ref 150.0–400.0)
RBC: 4.49 Mil/uL (ref 3.87–5.11)
RDW: 14 % (ref 11.5–15.5)
WBC: 9 10*3/uL (ref 4.0–10.5)

## 2019-06-26 NOTE — Assessment & Plan Note (Signed)
Likely due to mouth breathing in combination with OSA/CPAP and post nasal drop. Rx nystatin.

## 2019-06-26 NOTE — Assessment & Plan Note (Signed)
BP elevated slightly but close to goal.

## 2019-06-26 NOTE — Assessment & Plan Note (Signed)
Checking TSH and adjust synthroid 50 mcg daily as needed. Some new tingling in left hand which could be associated.

## 2019-06-26 NOTE — Assessment & Plan Note (Signed)
Checking HgA1c, thyroid, vitamin D, vitamin B12 and adjust as needed.

## 2019-07-05 ENCOUNTER — Other Ambulatory Visit: Payer: Self-pay | Admitting: Internal Medicine

## 2019-07-19 ENCOUNTER — Ambulatory Visit
Admission: RE | Admit: 2019-07-19 | Discharge: 2019-07-19 | Disposition: A | Discharge status: Home / Self Care | Payer: Medicare Other | Source: Ambulatory Visit | Attending: Family Medicine | Admitting: Family Medicine

## 2019-07-19 DIAGNOSIS — M545 Low back pain, unspecified: Secondary | ICD-10-CM

## 2019-07-19 DIAGNOSIS — M48061 Spinal stenosis, lumbar region without neurogenic claudication: Secondary | ICD-10-CM

## 2019-07-19 DIAGNOSIS — G8929 Other chronic pain: Secondary | ICD-10-CM

## 2019-07-21 ENCOUNTER — Telehealth: Payer: Self-pay | Admitting: Family Medicine

## 2019-07-21 NOTE — Telephone Encounter (Signed)
Lumbar MRI scan shows moderate narrowing of the spinal canal at 3 different levels.  This could certainly explain the pain when standing and walking.  Treatment options could include referral for epidural steroid injection; a trial of gabapentin to treat nerve pain; a trial of physical therapy; or potentially surgical consult if all else fails.

## 2019-07-22 NOTE — Telephone Encounter (Signed)
I called the patient and advised her of her results/treatment options. Will mail a copy of the result & options to the patient, along with the MRI report. She said she would need to think on this and let us know how she wishes to proceed.

## 2019-07-31 ENCOUNTER — Telehealth: Payer: Self-pay | Admitting: Family Medicine

## 2019-07-31 NOTE — Telephone Encounter (Signed)
Patient called.   She is following up on some paperwork and also wanting to raise a few questions for her providers.   Call back: [phone]

## 2019-07-31 NOTE — Telephone Encounter (Signed)
I called the patient. She received copies of MRI results, & Dr. Junius Roads' notes regarding those results, in the mail. She wanted to go over her options again with some clarification of each. The patient said today she has no pain in her back with walking. Some days are better than others. Right now, she is choosing to continue chiropractic treatments with Dr. Noberto Retort. Her next appointment with him is on 08/11/19. The one option she is leaning toward is a lumbar back brace for when she is doing house work. She will discuss this with Dr. Noberto Retort at her upcoming appointment and let us know if she would like an Rx for a brace.

## 2019-08-08 ENCOUNTER — Encounter: Payer: Self-pay | Admitting: Internal Medicine

## 2019-08-08 ENCOUNTER — Other Ambulatory Visit (HOSPITAL_COMMUNITY): Payer: Self-pay

## 2019-08-08 ENCOUNTER — Ambulatory Visit (INDEPENDENT_AMBULATORY_CARE_PROVIDER_SITE_OTHER): Payer: Medicare Other | Admitting: Internal Medicine

## 2019-08-08 VITALS — BP 164/88 | HR 94 | Ht 59.0 in | Wt 184.4 lb

## 2019-08-08 DIAGNOSIS — M48061 Spinal stenosis, lumbar region without neurogenic claudication: Secondary | ICD-10-CM | POA: Diagnosis not present

## 2019-08-08 DIAGNOSIS — R0781 Pleurodynia: Secondary | ICD-10-CM | POA: Diagnosis not present

## 2019-08-08 DIAGNOSIS — R1310 Dysphagia, unspecified: Secondary | ICD-10-CM | POA: Diagnosis not present

## 2019-08-08 DIAGNOSIS — T17908A Unspecified foreign body in respiratory tract, part unspecified causing other injury, initial encounter: Secondary | ICD-10-CM

## 2019-08-08 NOTE — Patient Instructions (Addendum)
If you are age 84 or older, your body mass index should be between 23-30. Your Body mass index is 37.24 kg/m. If this is out of the aforementioned range listed, please consider follow up with your Primary Care Provider.   You have been scheduled for a modified barium swallow on 6-18-21at 1:00pm. Please arrive 15 minutes prior to your test for registration. You will go to [facility] (1st Floor) for your appointment. Should you need to cancel or reschedule your appointment, please contact [phone] [facility] [place]) or [phone]). _____________________________________________________________________ A Modified Barium Swallow Study, or MBS, is a special x-ray that is taken to check swallowing skills. It is carried out by a Stage manager and a Psychologist, clinical (SLP). During this test, yourmouth, throat, and esophagus, a muscular tube which connects your mouth to your stomach, is checked. The test will help you, your doctor, and the SLP plan what types of foods and liquids are easier for you to swallow. The SLP will also identify positions and ways to help you swallow more easily and safely.  What will happen during an MBS?  You will be taken to an x-ray room and seated comfortably. You will be asked to swallow small amounts of food and liquid mixed with barium. Barium is a liquid or paste that allows images of your mouth, throat and esophagus to be seen on x-ray. The x-ray captures moving images of the food you are swallowing as it travels from your mouth through your throat and into your esophagus. This test helps identify whether food or liquid is entering your lungs (aspiration). The test also shows which part of your mouth or throat lacks strength or coordination to move the food or liquid in the right direction. This test typically takes 30 minutes to 1 hour to complete. _______________________________________________________________________  Thank you for  entrusting me with your care and choosing [facility].  Dr Carlean Purl

## 2019-08-08 NOTE — Progress Notes (Signed)
April Donaldson 86 y.o. [birth year] [id number]  Assessment & Plan:   Encounter Diagnoses  Name Primary?  Marland Kitchen Aspiration into airway, initial encounter - suspected Yes  . Spinal stenosis of lumbar region, unspecified whether neurogenic claudication present   . Rib tenderness - left    Evaluate what I think is aspiration and pharyngoesophageal dysphagia most likely, with modified barium swallow.  I told her I doubt her spinal stenosis would affect her bowel habits.  Reassured about left rib tenderness.  I appreciate the opportunity to care for this patient. CC: Hoyt Koch, MD    Subjective:   Chief Complaint: Dysphagia with coughing occasional bloating  HPI April Donaldson is an 86 year old African-American woman here with a few complaints, 1 is that she reports getting strangled "a lot".  What she describes is that sometimes when she is drinking liquids, swallowing saliva, or biting into fruit which has a high liquid content she will call off and actually aspirate some of the fluid and cough.  No solid dysphagia.  She says she has spoken about this with Dr. Lucia Gaskins, her otolaryngologist.  In 2012 Dr. Ernesto Rutherford had ordered a modified barium swallow and from what I can see in the computer though the records are minimal that was normal.  During the pandemic over the past year, her appetite was off but that has resolved.  Additionally she has low back pain and spinal stenosis and she did some reading and found out that her bowel habits or movements could be affected because of the spinal stenosis and possible neurologic damage.  She is not really having any constipation or problems.  Also having some left upper quadrant pain from time to time. Wt Readings from Last 3 Encounters:  08/08/19 184 lb 6 oz (83.6 kg)  06/25/19 186 lb 9.6 oz (84.6 kg)  04/22/19 186 lb 2 oz (84.4 kg)   Last colonoscopy 2011 with 2 diminutive adenomas diverticulosis stenotic segment of the sigmoid colon  and internal hemorrhoids.  Allergies  Allergen Reactions  . Amlodipine Besylate Swelling and Other (See Comments)    Causes swelling  . Clonidine Nausea Only and Other (See Comments)    Sweating, felt faint  . Diltiazem Hcl Swelling and Other (See Comments)    meds did not work for her----causes swelling  . Klonopin [Clonazepam] Other (See Comments)    Vivid dreams  . Tetracycline Swelling  . Doxycycline Hyclate Other (See Comments)    REACTION:  tetracycline  . Hydralazine Rash  . Iodine Other (See Comments)    IVP contrast Pt. States topical iodine is fine to use without any irritation. H.Mickley RN  . Labetalol Hcl Other (See Comments)    Unknown  . Lidocaine Rash  . Olmesartan Medoxomil Other (See Comments)    Unknown   Current Meds  Medication Sig  . allopurinol (ZYLOPRIM) 100 MG tablet TAKE 1 TABLET BY MOUTH EVERY DAY  . aspirin 81 MG tablet Take 81 mg by mouth daily.    Marland Kitchen atenolol (TENORMIN) 50 MG tablet TAKE 2 TABLETS DAILY  . dorzolamide-timolol (COSOPT) 22.3-6.8 MG/ML ophthalmic solution Place 1 drop into both eyes 2 (two) times daily.  Marland Kitchen latanoprost (XALATAN) 0.005 % ophthalmic solution Place 1 drop into both eyes at bedtime.   Marland Kitchen levothyroxine (SYNTHROID) 50 MCG tablet TAKE 1 TABLET BY MOUTH DAILY BEFORE BREAKFAST  . lisinopril (ZESTRIL) 10 MG tablet Take 10 mg by mouth as directed. Takes 10mg  in the am and 5mg  at bedtime  .  lisinopril (ZESTRIL) 5 MG tablet Take 5 mg by mouth as directed. Takes 10mg  in the am and 5mg  at bedtime  . simvastatin (ZOCOR) 20 MG tablet TAKE 1 TABLET DAILY AT 6 P.M.  . triamterene-hydrochlorothiazide (DYAZIDE) 37.5-25 MG capsule TAKE 1 CAPSULE BY MOUTH EVERY DAY   Past Medical History:  Diagnosis Date  . Anxiety   . Benign neoplasm of kidney, except pelvis   . Cellulitis of leg 03/09/2013 hosp   . Cerebrovascular disease   . Chronic low back pain   . Difficult intubation    per patient with hand surgery in 2016 at surgical center- lip  was pinched and swollen after surgery- patient states dr Burney Gauze never stated a problem with intubation   . Diverticulosis   . DJD (degenerative joint disease)   . Dyspnea    with exertion   . GERD (gastroesophageal reflux disease)    no meds  . Glaucoma   . Gout   . H/O hiatal hernia   . Hepatitis    hx of years ago   . Hx of colonic polyps   . Hypercholesteremia   . Hypertension   . Hypothyroid   . IBS (irritable bowel syndrome)   . Internal hemorrhoids   . Obesity   . OSA (obstructive sleep apnea)    NPSG 2009: AHI 20/h. CPAP - follows with [facility]  . Renal insufficiency    one kidney small and non funtioning  . Tubular adenoma 2000  . Venous insufficiency    Past Surgical History:  Procedure Laterality Date  . ABDOMINAL HYSTERECTOMY    . APPENDECTOMY    . CATARACT EXTRACTION, BILATERAL Bilateral 2017  . cataracts Bilateral   . COLONOSCOPY    . CYSTOSCOPY/RETROGRADE/URETEROSCOPY Bilateral 04/09/2017   Procedure: CYSTOSCOPY/RETROGRADE;  Surgeon: Raynelle Bring, MD;  Location: [facility];  Service: Urology;  Laterality: Bilateral;  ONLY NEEDS 30 MIN FOR PROCEDURE  . DILATION AND CURETTAGE OF UTERUS    . FUNCTIONAL ENDOSCOPIC SINUS SURGERY  05/2003   Dr. Ernesto Rutherford  . hysterctomy and ooperectomy  1970's  . MASS EXCISION  2003   lt  . MASS EXCISION Left 09/17/2013   Procedure: EXCISION MASS LEFT HAND;  Surgeon: Schuyler Amor, MD;  Location: [facility];  Service: Orthopedics;  Laterality: Left;  . thyroid lobectomy for a cyst  10/2000   Dr. Ernesto Rutherford  . TONSILLECTOMY     Social History   Social History Narrative   Lives alone, indep - never married   g-dtr in [place], siblings, nieces in [place] in touch with a few friends who talk regularly   family history includes Asthma in her brother and sister; Brain cancer (age of onset: 43) in her sister; Breast cancer (age of onset: 51) in her sister; Congestive Heart Failure in her mother; Diabetes in her  mother; Heart disease in her mother; Throat cancer (age of onset: 16) in her brother.   Review of Systems As per HPI.  Pedal edema occasionally insomnia.  All other review of systems are negative.  Objective:   Physical Exam BP (!) 164/88 (BP Location: Left Arm, Patient Position: Sitting, Cuff Size: Normal)   Pulse 94   Ht 4\' 11"  (1.499 m)   Wt 184 lb 6 oz (83.6 kg)   SpO2 98%   BMI 37.24 kg/m  Obese pleasant elderly bw NAD Mouth and pharynx clear Neck supple w/o TM/mass and no LA abd obese soft and NT but left anterior inferior ribs  mildly tender Alert and oriented x 3 approp mood and affect

## 2019-08-15 ENCOUNTER — Ambulatory Visit (HOSPITAL_COMMUNITY)
Admission: RE | Admit: 2019-08-15 | Discharge: 2019-08-15 | Disposition: A | Discharge status: Home / Self Care | Payer: Medicare Other | Source: Ambulatory Visit | Attending: Internal Medicine | Admitting: Internal Medicine

## 2019-08-15 ENCOUNTER — Other Ambulatory Visit: Payer: Self-pay

## 2019-08-15 DIAGNOSIS — R1310 Dysphagia, unspecified: Secondary | ICD-10-CM | POA: Diagnosis present

## 2019-08-15 DIAGNOSIS — T17908A Unspecified foreign body in respiratory tract, part unspecified causing other injury, initial encounter: Secondary | ICD-10-CM | POA: Diagnosis present

## 2019-08-18 ENCOUNTER — Encounter: Payer: Self-pay | Admitting: Internal Medicine

## 2019-08-18 ENCOUNTER — Ambulatory Visit (INDEPENDENT_AMBULATORY_CARE_PROVIDER_SITE_OTHER): Payer: Medicare Other | Admitting: Internal Medicine

## 2019-08-18 ENCOUNTER — Other Ambulatory Visit: Payer: Self-pay

## 2019-08-18 VITALS — BP 128/82 | HR 64 | Temp 98.8°F | Ht 59.0 in | Wt 184.0 lb

## 2019-08-18 DIAGNOSIS — E782 Mixed hyperlipidemia: Secondary | ICD-10-CM | POA: Diagnosis not present

## 2019-08-18 DIAGNOSIS — E039 Hypothyroidism, unspecified: Secondary | ICD-10-CM

## 2019-08-18 DIAGNOSIS — Z0000 Encounter for general adult medical examination without abnormal findings: Secondary | ICD-10-CM

## 2019-08-18 DIAGNOSIS — I10 Essential (primary) hypertension: Secondary | ICD-10-CM | POA: Diagnosis not present

## 2019-08-18 DIAGNOSIS — N1831 Chronic kidney disease, stage 3a: Secondary | ICD-10-CM

## 2019-08-18 NOTE — Assessment & Plan Note (Signed)
Recent TSH at goal without indication for change with synthroid 50 mcg daily.

## 2019-08-18 NOTE — Progress Notes (Signed)
   Subjective:   Patient ID: April Donaldson, female    DOB: 13-Sep-1931, 86 y.o.   MRN: [id number]  HPI The patient is an 86 YO female coming in for physical.   PMH, [place], social history reviewed and updated  Review of Systems  Constitutional: Negative.   HENT: Negative.   Eyes: Negative.   Respiratory: Positive for shortness of breath. Negative for cough and chest tightness.        Chronic stable  Cardiovascular: Negative for chest pain, palpitations and leg swelling.  Gastrointestinal: Negative for abdominal distention, abdominal pain, constipation, diarrhea, nausea and vomiting.  Musculoskeletal: Negative.   Skin: Negative.   Neurological: Negative.   Psychiatric/Behavioral: Negative.     Objective:  Physical Exam Constitutional:      Appearance: She is well-developed. She is obese.  HENT:     Head: Normocephalic and atraumatic.  Cardiovascular:     Rate and Rhythm: Normal rate and regular rhythm.  Pulmonary:     Effort: Pulmonary effort is normal. No respiratory distress.     Breath sounds: Normal breath sounds. No wheezing or rales.  Abdominal:     General: Bowel sounds are normal. There is no distension.     Palpations: Abdomen is soft.     Tenderness: There is no abdominal tenderness. There is no rebound.  Musculoskeletal:     Cervical back: Normal range of motion.  Skin:    General: Skin is warm and dry.  Neurological:     Mental Status: She is alert and oriented to person, place, and time.     Coordination: Coordination normal.     Vitals:   08/18/19 0829  BP: 128/82  Pulse: 64  Temp: 98.8 F (37.1 C)  TempSrc: Oral  SpO2: 96%  Weight: 184 lb (83.5 kg)  Height: 4\' 11"  (1.499 m)    This visit occurred during the SARS-CoV-2 public health emergency.  Safety protocols were in place, including screening questions prior to the visit, additional usage of staff PPE, and extensive cleaning of exam room while observing appropriate contact time as indicated for  disinfecting solutions.   Assessment & Plan:

## 2019-08-18 NOTE — Assessment & Plan Note (Signed)
BP at goal on atenolol and lisinopril 15 mg daily and hctz 25 mg daily. Recent BMP at goal without indication for change.

## 2019-08-18 NOTE — Patient Instructions (Signed)
Health Maintenance, Female Adopting a healthy lifestyle and getting preventive care are important in promoting health and wellness. Ask your health care provider about:  The right schedule for you to have regular tests and exams.  Things you can do on your own to prevent diseases and keep yourself healthy. What should I know about diet, weight, and exercise? Eat a healthy diet   Eat a diet that includes plenty of vegetables, fruits, low-fat dairy products, and lean protein.  Do not eat a lot of foods that are high in solid fats, added sugars, or sodium. Maintain a healthy weight Body mass index (BMI) is used to identify weight problems. It estimates body fat based on height and weight. Your health care provider can help determine your BMI and help you achieve or maintain a healthy weight. Get regular exercise Get regular exercise. This is one of the most important things you can do for your health. Most adults should:  Exercise for at least 150 minutes each week. The exercise should increase your heart rate and make you sweat (moderate-intensity exercise).  Do strengthening exercises at least twice a week. This is in addition to the moderate-intensity exercise.  Spend less time sitting. Even light physical activity can be beneficial. Watch cholesterol and blood lipids Have your blood tested for lipids and cholesterol at 84 years of age, then have this test every 5 years. Have your cholesterol levels checked more often if:  Your lipid or cholesterol levels are high.  You are older than 84 years of age.  You are at high risk for heart disease. What should I know about cancer screening? Depending on your health history and family history, you may need to have cancer screening at various ages. This may include screening for:  Breast cancer.  Cervical cancer.  Colorectal cancer.  Skin cancer.  Lung cancer. What should I know about heart disease, diabetes, and high blood  pressure? Blood pressure and heart disease  High blood pressure causes heart disease and increases the risk of stroke. This is more likely to develop in people who have high blood pressure readings, are of African descent, or are overweight.  Have your blood pressure checked: ? Every 3-5 years if you are 18-39 years of age. ? Every year if you are 40 years old or older. Diabetes Have regular diabetes screenings. This checks your fasting blood sugar level. Have the screening done:  Once every three years after age 40 if you are at a normal weight and have a low risk for diabetes.  More often and at a younger age if you are overweight or have a high risk for diabetes. What should I know about preventing infection? Hepatitis B If you have a higher risk for hepatitis B, you should be screened for this virus. Talk with your health care provider to find out if you are at risk for hepatitis B infection. Hepatitis C Testing is recommended for:  Everyone born from 1945 through 1965.  Anyone with known risk factors for hepatitis C. Sexually transmitted infections (STIs)  Get screened for STIs, including gonorrhea and chlamydia, if: ? You are sexually active and are younger than 84 years of age. ? You are older than 84 years of age and your health care provider tells you that you are at risk for this type of infection. ? Your sexual activity has changed since you were last screened, and you are at increased risk for chlamydia or gonorrhea. Ask your health care provider if   you are at risk.  Ask your health care provider about whether you are at high risk for HIV. Your health care provider may recommend a prescription medicine to help prevent HIV infection. If you choose to take medicine to prevent HIV, you should first get tested for HIV. You should then be tested every 3 months for as long as you are taking the medicine. Pregnancy  If you are about to stop having your period (premenopausal) and  you may become pregnant, seek counseling before you get pregnant.  Take 400 to 800 micrograms (mcg) of folic acid every day if you become pregnant.  Ask for birth control (contraception) if you want to prevent pregnancy. Osteoporosis and menopause Osteoporosis is a disease in which the bones lose minerals and strength with aging. This can result in bone fractures. If you are 65 years old or older, or if you are at risk for osteoporosis and fractures, ask your health care provider if you should:  Be screened for bone loss.  Take a calcium or vitamin D supplement to lower your risk of fractures.  Be given hormone replacement therapy (HRT) to treat symptoms of menopause. Follow these instructions at home: Lifestyle  Do not use any products that contain nicotine or tobacco, such as cigarettes, e-cigarettes, and chewing tobacco. If you need help quitting, ask your health care provider.  Do not use street drugs.  Do not share needles.  Ask your health care provider for help if you need support or information about quitting drugs. Alcohol use  Do not drink alcohol if: ? Your health care provider tells you not to drink. ? You are pregnant, may be pregnant, or are planning to become pregnant.  If you drink alcohol: ? Limit how much you use to 0-1 drink a day. ? Limit intake if you are breastfeeding.  Be aware of how much alcohol is in your drink. In the [place] one drink equals one 12 oz bottle of beer (355 mL), one 5 oz glass of wine (148 mL), or one 1 oz glass of hard liquor (44 mL). General instructions  Schedule regular health, dental, and eye exams.  Stay current with your vaccines.  Tell your health care provider if: ? You often feel depressed. ? You have ever been abused or do not feel safe at home. Summary  Adopting a healthy lifestyle and getting preventive care are important in promoting health and wellness.  Follow your health care provider's instructions about healthy  diet, exercising, and getting tested or screened for diseases.  Follow your health care provider's instructions on monitoring your cholesterol and blood pressure. This information is not intended to replace advice given to you by your health care provider. Make sure you discuss any questions you have with your health care provider. Document Revised: 02/06/2018 Document Reviewed: 02/06/2018 Elsevier Patient Education  2020 Elsevier Inc[employer]  

## 2019-08-18 NOTE — Assessment & Plan Note (Signed)
Flu shot due next season. Pneumonia complete. Shingrix counseled. Tetanus due 2024. Colonoscopy aged out. Mammogram aged out, pap smear aged out and dexa declines further. Covid-19 up to date. Counseled about sun safety and mole surveillance. Counseled about the dangers of distracted driving. Given 10 year screening recommendations.

## 2019-08-18 NOTE — Assessment & Plan Note (Signed)
BMI 37 with complication HTN, CKD, OSA. She is working on weight loss, not active currently.

## 2019-08-18 NOTE — Assessment & Plan Note (Signed)
Taking simvastatin 20 mg daily without side effects. Check lipid panel with next labs.

## 2019-08-18 NOTE — Assessment & Plan Note (Signed)
Stable GFR on most recent labs and following with nephrology. BP at goal, no DM.

## 2019-08-19 ENCOUNTER — Telehealth: Payer: Self-pay | Admitting: Internal Medicine

## 2019-08-19 NOTE — Telephone Encounter (Signed)
Spoke with patient, see result note for more information 

## 2019-10-20 ENCOUNTER — Other Ambulatory Visit: Payer: Self-pay

## 2019-10-20 ENCOUNTER — Ambulatory Visit (INDEPENDENT_AMBULATORY_CARE_PROVIDER_SITE_OTHER): Payer: Medicare Other | Admitting: Pulmonary Disease

## 2019-10-20 ENCOUNTER — Encounter: Payer: Self-pay | Admitting: Pulmonary Disease

## 2019-10-20 VITALS — BP 138/64 | HR 65 | Temp 98.3°F | Ht 59.0 in | Wt 183.0 lb

## 2019-10-20 DIAGNOSIS — E669 Obesity, unspecified: Secondary | ICD-10-CM

## 2019-10-20 DIAGNOSIS — J310 Chronic rhinitis: Secondary | ICD-10-CM

## 2019-10-20 DIAGNOSIS — G4730 Sleep apnea, unspecified: Secondary | ICD-10-CM | POA: Diagnosis not present

## 2019-10-20 DIAGNOSIS — G4733 Obstructive sleep apnea (adult) (pediatric): Secondary | ICD-10-CM

## 2019-10-20 NOTE — Progress Notes (Signed)
[facility]  Chief Complaint  Patient presents with  . Follow-up    feels "okay"    Constitutional:  BP 138/64 (Cuff Size: Normal)   Pulse 65   Temp 98.3 F (36.8 C)   Ht 4\' 11"  (1.499 m)   Wt 183 lb (83 kg)   SpO2 93%   BMI 36.96 kg/m   Past Medical History:  IBS, Hypothyroidism, HTN, HLD, Colon polyps, HH, Gout, Glaucoma, GERD, CVA, Anxiety, Cellulitis Lt leg 2015, Chronic back pain, Difficult intubation  Brief Summary:  April Donaldson is a 86 y.o. female with obstructive sleep apnea.  Subjective:   Uses CPAP on most nights.  Has nasal mask.  Still has nasal drainage and cough in the morning.  Not sinus pressure, fever, or headache.  Saw ENT.  Hasn't been using any nose sprays recently.  Physical Exam:   Appearance - well kempt   ENMT - no sinus tenderness, no oral exudate, no LAN, Mallampati 3 airway, no stridor  Respiratory - equal breath sounds bilaterally, no wheezing or rales  CV - s1s2 regular rate and rhythm, no murmurs  Ext - no clubbing, no edema  Skin - no rashes  Psych - normal mood and affect    Assessment/Plan:   Obstructive sleep apnea. - she is compliant with CPAP - gets supplies from [employer] - continue auto CPAP  CPAP rhinitis. - advised her to try nasal irrigation and flonase prn  Obesity. - she is aware of how her weight can impact her health, particularly with regards to sleep apnea  A total of  22 minutes spent addressing patient care issues on day of visit.   Follow up:   Patient Instructions  Follow up in 1 year   Signature:  Chesley Mires, MD [place] Pager: [phone] 10/20/2019, 11:01 AM  Flow Sheet      Sleep tests:   PSG 10/14/07 >> AHI 20  Auto CPAP 09/17/19 to 10/16/19 >> used on 24 of 30 nights with average 5 hrs 42 minutes.  Average AHI 0.8 with median CPAP 8 and 95 th percentile CPAP 12 cm H2O  Medications:   Allergies as of 10/20/2019       Reactions   Amlodipine Besylate Swelling, Other (See Comments)   Causes swelling   Clonidine Nausea Only, Other (See Comments)   Sweating, felt faint   Diltiazem Hcl Swelling, Other (See Comments)   meds did not work for her----causes swelling   Klonopin [clonazepam] Other (See Comments)   Vivid dreams   Tetracycline Swelling   Doxycycline Hyclate Other (See Comments)   REACTION:  tetracycline   Hydralazine Rash   Iodine Other (See Comments)   IVP contrast Pt. States topical iodine is fine to use without any irritation. H.Mickley RN   Labetalol Hcl Other (See Comments)   Unknown   Lidocaine Rash   Olmesartan Medoxomil Other (See Comments)   Unknown      Medication List       Accurate as of October 20, 2019 11:01 AM. If you have any questions, ask your nurse or doctor.        allopurinol 100 MG tablet Commonly known as: ZYLOPRIM TAKE 1 TABLET BY MOUTH EVERY DAY   aspirin 81 MG tablet Take 81 mg by mouth daily.   atenolol 50 MG tablet Commonly known as: TENORMIN TAKE 2 TABLETS DAILY   dorzolamide-timolol 22.3-6.8 MG/ML ophthalmic solution Commonly known as: COSOPT Place 1 drop into both  eyes 2 (two) times daily.   latanoprost 0.005 % ophthalmic solution Commonly known as: XALATAN Place 1 drop into both eyes at bedtime.   levothyroxine 50 MCG tablet Commonly known as: SYNTHROID TAKE 1 TABLET BY MOUTH DAILY BEFORE BREAKFAST   lisinopril 5 MG tablet Commonly known as: ZESTRIL Take 5 mg by mouth as directed. Takes 10mg  in the am and 5mg  at bedtime   lisinopril 10 MG tablet Commonly known as: ZESTRIL Take 10 mg by mouth as directed. Takes 10mg  in the am and 5mg  at bedtime   simvastatin 20 MG tablet Commonly known as: ZOCOR TAKE 1 TABLET DAILY AT 6 P.M.   triamterene-hydrochlorothiazide 37.5-25 MG capsule Commonly known as: DYAZIDE TAKE 1 CAPSULE BY MOUTH EVERY DAY       Past Surgical History:  She  has a past surgical history that includes hysterctomy  and ooperectomy (1970's); thyroid lobectomy for a cyst (10/2000); Functional endoscopic sinus surgery (05/2003); Colonoscopy; Tonsillectomy; Appendectomy; Dilation and curettage of uterus; Mass excision (2003); Mass excision (Left, 09/17/2013); cataracts (Bilateral); Abdominal hysterectomy; Cystoscopy/retrograde/ureteroscopy (Bilateral, 04/09/2017); and Cataract extraction, bilateral (Bilateral, 2017).  Family History:  Her family history includes Asthma in her brother and sister; Brain cancer (age of onset: 66) in her sister; Breast cancer (age of onset: 95) in her sister; Congestive Heart Failure in her mother; Diabetes in her mother; Heart disease in her mother; Throat cancer (age of onset: 29) in her brother.  Social History:  She  reports that she has never smoked. She has never used smokeless tobacco. She reports that she does not drink alcohol and does not use drugs.

## 2019-10-20 NOTE — Patient Instructions (Signed)
Follow up in 1 year.

## 2019-11-14 ENCOUNTER — Other Ambulatory Visit: Payer: Self-pay

## 2019-11-14 ENCOUNTER — Ambulatory Visit (INDEPENDENT_AMBULATORY_CARE_PROVIDER_SITE_OTHER): Payer: Medicare Other

## 2019-11-14 DIAGNOSIS — Z0000 Encounter for general adult medical examination without abnormal findings: Secondary | ICD-10-CM

## 2019-11-14 NOTE — Patient Instructions (Addendum)
April Donaldson , Thank you for taking time to come for your Medicare Wellness Visit. I appreciate your ongoing commitment to your health goals. Please review the following plan we discussed and let me know if I can assist you in the future.   Screening recommendations/referrals: Colonoscopy: not a candidate for colon cancer screening due to age. Mammogram: 08/22/2019 Bone Density: 07/25/2017 (results were normal) Recommended yearly ophthalmology/optometry visit for glaucoma screening and checkup Recommended yearly dental visit for hygiene and checkup  Vaccinations: Influenza vaccine: 11/09/2018 Pneumococcal vaccine: up to date Tdap vaccine: 03/21/2012; due every 10 years (2024) Shingles vaccine: never done   Covid-19: up to date [employer]); will be getting booster when available.  Advanced directives: Please bring a copy of your health care power of attorney and living will to the office at your convenience.  Conditions/risks identified: Yes. Reviewed health maintenance screenings with patient today and relevant education, vaccines, and/or referrals were provided. Continue doing brain stimulating activities (puzzles, reading, adult coloring books, staying active) to keep memory sharp. Continue to eat heart healthy diet (full of fruits, vegetables, whole grains, lean protein, water--limit salt, fat, and sugar intake) and increase physical activity as tolerated.  Next appointment: Please schedule your next Medicare Wellness Visit with your Nurse Health Advisor in 1 year.  Preventive Care 84 Years and Older, Female Preventive care refers to lifestyle choices and visits with your health care provider that can promote health and wellness. What does preventive care include?  A yearly physical exam. This is also called an annual well check.  Dental exams once or twice a year.  Routine eye exams. Ask your health care provider how often you should have your eyes checked.  Personal lifestyle choices,  including:  Daily care of your teeth and gums.  Regular physical activity.  Eating a healthy diet.  Avoiding tobacco and drug use.  Limiting alcohol use.  Practicing safe sex.  Taking low-dose aspirin every day.  Taking vitamin and mineral supplements as recommended by your health care provider. What happens during an annual well check? The services and screenings done by your health care provider during your annual well check will depend on your age, overall health, lifestyle risk factors, and family history of disease. Counseling  Your health care provider may ask you questions about your:  Alcohol use.  Tobacco use.  Drug use.  Emotional well-being.  Home and relationship well-being.  Sexual activity.  Eating habits.  History of falls.  Memory and ability to understand (cognition).  Work and work Statistician.  Reproductive health. Screening  You may have the following tests or measurements:  Height, weight, and BMI.  Blood pressure.  Lipid and cholesterol levels. These may be checked every 5 years, or more frequently if you are over 8 years old.  Skin check.  Lung cancer screening. You may have this screening every year starting at age 84 if you have a 30-pack-year history of smoking and currently smoke or have quit within the past 15 years.  Fecal occult blood test (FOBT) of the stool. You may have this test every year starting at age 84.  Flexible sigmoidoscopy or colonoscopy. You may have a sigmoidoscopy every 5 years or a colonoscopy every 10 years starting at age 84.  Hepatitis C blood test.  Hepatitis B blood test.  Sexually transmitted disease (STD) testing.  Diabetes screening. This is done by checking your blood sugar (glucose) after you have not eaten for a while (fasting). You may have this done every  1-3 years.  Bone density scan. This is done to screen for osteoporosis. You may have this done starting at age 84.  Mammogram. This  may be done every 1-2 years. Talk to your health care provider about how often you should have regular mammograms. Talk with your health care provider about your test results, treatment options, and if necessary, the need for more tests. Vaccines  Your health care provider may recommend certain vaccines, such as:  Influenza vaccine. This is recommended every year.  Tetanus, diphtheria, and acellular pertussis (Tdap, Td) vaccine. You may need a Td booster every 10 years.  Zoster vaccine. You may need this after age 84.  Pneumococcal 13-valent conjugate (PCV13) vaccine. One dose is recommended after age 84.  Pneumococcal polysaccharide (PPSV23) vaccine. One dose is recommended after age 84. Talk to your health care provider about which screenings and vaccines you need and how often you need them. This information is not intended to replace advice given to you by your health care provider. Make sure you discuss any questions you have with your health care provider. Document Released: 03/12/2015 Document Revised: 11/03/2015 Document Reviewed: 12/15/2014 Elsevier Interactive Patient Education  2017 [place] Prevention in the Home Falls can cause injuries. They can happen to people of all ages. There are many things you can do to make your home safe and to help prevent falls. What can I do on the outside of my home?  Regularly fix the edges of walkways and driveways and fix any cracks.  Remove anything that might make you trip as you walk through a door, such as a raised step or threshold.  Trim any bushes or trees on the path to your home.  Use bright outdoor lighting.  Clear any walking paths of anything that might make someone trip, such as rocks or tools.  Regularly check to see if handrails are loose or broken. Make sure that both sides of any steps have handrails.  Any raised decks and porches should have guardrails on the edges.  Have any leaves, snow, or ice cleared  regularly.  Use sand or salt on walking paths during winter.  Clean up any spills in your garage right away. This includes oil or grease spills. What can I do in the bathroom?  Use night lights.  Install grab bars by the toilet and in the tub and shower. Do not use towel bars as grab bars.  Use non-skid mats or decals in the tub or shower.  If you need to sit down in the shower, use a plastic, non-slip stool.  Keep the floor dry. Clean up any water that spills on the floor as soon as it happens.  Remove soap buildup in the tub or shower regularly.  Attach bath mats securely with double-sided non-slip rug tape.  Do not have throw rugs and other things on the floor that can make you trip. What can I do in the bedroom?  Use night lights.  Make sure that you have a light by your bed that is easy to reach.  Do not use any sheets or blankets that are too big for your bed. They should not hang down onto the floor.  Have a firm chair that has side arms. You can use this for support while you get dressed.  Do not have throw rugs and other things on the floor that can make you trip. What can I do in the kitchen?  Clean up any spills right away.  Avoid walking on wet floors.  Keep items that you use a lot in easy-to-reach places.  If you need to reach something above you, use a strong step stool that has a grab bar.  Keep electrical cords out of the way.  Do not use floor polish or wax that makes floors slippery. If you must use wax, use non-skid floor wax.  Do not have throw rugs and other things on the floor that can make you trip. What can I do with my stairs?  Do not leave any items on the stairs.  Make sure that there are handrails on both sides of the stairs and use them. Fix handrails that are broken or loose. Make sure that handrails are as long as the stairways.  Check any carpeting to make sure that it is firmly attached to the stairs. Fix any carpet that is loose  or worn.  Avoid having throw rugs at the top or bottom of the stairs. If you do have throw rugs, attach them to the floor with carpet tape.  Make sure that you have a light switch at the top of the stairs and the bottom of the stairs. If you do not have them, ask someone to add them for you. What else can I do to help prevent falls?  Wear shoes that:  Do not have high heels.  Have rubber bottoms.  Are comfortable and fit you well.  Are closed at the toe. Do not wear sandals.  If you use a stepladder:  Make sure that it is fully opened. Do not climb a closed stepladder.  Make sure that both sides of the stepladder are locked into place.  Ask someone to hold it for you, if possible.  Clearly mark and make sure that you can see:  Any grab bars or handrails.  First and last steps.  Where the edge of each step is.  Use tools that help you move around (mobility aids) if they are needed. These include:  Canes.  Walkers.  Scooters.  Crutches.  Turn on the lights when you go into a dark area. Replace any light bulbs as soon as they burn out.  Set up your furniture so you have a clear path. Avoid moving your furniture around.  If any of your floors are uneven, fix them.  If there are any pets around you, be aware of where they are.  Review your medicines with your doctor. Some medicines can make you feel dizzy. This can increase your chance of falling. Ask your doctor what other things that you can do to help prevent falls. This information is not intended to replace advice given to you by your health care provider. Make sure you discuss any questions you have with your health care provider. Document Released: 12/10/2008 Document Revised: 07/22/2015 Document Reviewed: 03/20/2014 Elsevier Interactive Patient Education  2017 [employer].

## 2019-11-14 NOTE — Progress Notes (Signed)
I connected with April Donaldson today by telephone and verified that I am speaking with the correct person using two identifiers. Location patient: home Location provider: work Persons participating in the virtual visit: April Donaldson and [place], LPN.   I discussed the limitations, risks, security and privacy concerns of performing an evaluation and management service by telephone and the availability of in person appointments. I also discussed with the patient that there may be a patient responsible charge related to this service. The patient expressed understanding and verbally consented to this telephonic visit.    Interactive audio and video telecommunications were attempted between this provider and patient, however failed, due to patient having technical difficulties OR patient did not have access to video capability.  We continued and completed visit with audio only.  Some vital signs may be absent or patient reported.   Time Spent with patient on telephone encounter: 25 minutes  Subjective:   April Donaldson is a 86 y.o. female who presents for Medicare Annual (Subsequent) preventive examination.  Review of Systems    No ROS. Medicare Wellness Visit. Cardiac Risk Factors include: advanced age (>52men, >66 women);dyslipidemia;family history of premature cardiovascular disease;hypertension;obesity (BMI >30kg/m2)     Objective:    There were no vitals filed for this visit. There is no height or weight on file to calculate BMI.  Advanced Directives 11/14/2019 03/05/2018 05/16/2017 04/09/2017 02/15/2017 06/22/2016 05/19/2016  Does Patient Have a Medical Advance Directive? Yes Yes No Yes Yes No Yes  Type of Advance Directive - [place];Living will - Healthcare Power of [place];Living will - [place]  Does patient want to make changes to medical advance directive? No - Patient declined - - - - - No - Patient  declined  Copy of [place] in Chart? - No - copy requested - No - copy requested No - copy requested - -  Would patient like information on creating a medical advance directive? - - Yes (ED - Information included in AVS) - - - -  Pre-existing out of facility DNR order (yellow form or pink MOST form) - - - - - - -    Current Medications (verified) Outpatient Encounter Medications as of 11/14/2019  Medication Sig  . allopurinol (ZYLOPRIM) 100 MG tablet TAKE 1 TABLET BY MOUTH EVERY DAY  . aspirin 81 MG tablet Take 81 mg by mouth daily.    Marland Kitchen atenolol (TENORMIN) 50 MG tablet TAKE 2 TABLETS DAILY  . dorzolamide-timolol (COSOPT) 22.3-6.8 MG/ML ophthalmic solution Place 1 drop into both eyes 2 (two) times daily.  Marland Kitchen latanoprost (XALATAN) 0.005 % ophthalmic solution Place 1 drop into both eyes at bedtime.   Marland Kitchen levothyroxine (SYNTHROID) 50 MCG tablet TAKE 1 TABLET BY MOUTH DAILY BEFORE BREAKFAST  . lisinopril (ZESTRIL) 10 MG tablet Take 10 mg by mouth as directed. Takes 10mg  in the am and 5mg  at bedtime  . lisinopril (ZESTRIL) 5 MG tablet Take 5 mg by mouth as directed. Takes 10mg  in the am and 5mg  at bedtime  . simvastatin (ZOCOR) 20 MG tablet TAKE 1 TABLET DAILY AT 6 P.M.  . triamterene-hydrochlorothiazide (DYAZIDE) 37.5-25 MG capsule TAKE 1 CAPSULE BY MOUTH EVERY DAY   No facility-administered encounter medications on file as of 11/14/2019.    Allergies (verified) Amlodipine besylate, Clonidine, Diltiazem hcl, Klonopin [clonazepam], Tetracycline, Doxycycline hyclate, Hydralazine, Iodine, Labetalol hcl, Lidocaine, and Olmesartan medoxomil   History: Past Medical History:  Diagnosis Date  .  Anxiety   . Benign neoplasm of kidney, except pelvis   . Cellulitis of leg 03/09/2013 hosp   . Cerebrovascular disease   . Chronic low back pain   . Difficult intubation    per patient with hand surgery in 2016 at surgical center- lip was pinched and swollen after surgery- patient states  dr Burney Gauze never stated a problem with intubation   . Diverticulosis   . DJD (degenerative joint disease)   . Dyspnea    with exertion   . GERD (gastroesophageal reflux disease)    no meds  . Glaucoma   . Gout   . H/O hiatal hernia   . Hepatitis    hx of years ago   . Hx of colonic polyps   . Hypercholesteremia   . Hypertension   . Hypothyroid   . IBS (irritable bowel syndrome)   . Internal hemorrhoids   . Obesity   . OSA (obstructive sleep apnea)    NPSG 2009: AHI 20/h. CPAP - follows with [facility]  . Renal insufficiency    one kidney small and non funtioning  . Tubular adenoma 2000  . Venous insufficiency    Past Surgical History:  Procedure Laterality Date  . ABDOMINAL HYSTERECTOMY    . APPENDECTOMY    . CATARACT EXTRACTION, BILATERAL Bilateral 2017  . cataracts Bilateral   . COLONOSCOPY    . CYSTOSCOPY/RETROGRADE/URETEROSCOPY Bilateral 04/09/2017   Procedure: CYSTOSCOPY/RETROGRADE;  Surgeon: Raynelle Bring, MD;  Location: [facility];  Service: Urology;  Laterality: Bilateral;  ONLY NEEDS 30 MIN FOR PROCEDURE  . DILATION AND CURETTAGE OF UTERUS    . FUNCTIONAL ENDOSCOPIC SINUS SURGERY  05/2003   Dr. Ernesto Rutherford  . hysterctomy and ooperectomy  1970's  . MASS EXCISION  2003   lt  . MASS EXCISION Left 09/17/2013   Procedure: EXCISION MASS LEFT HAND;  Surgeon: Schuyler Amor, MD;  Location: [place];  Service: Orthopedics;  Laterality: Left;  . thyroid lobectomy for a cyst  10/2000   Dr. Ernesto Rutherford  . TONSILLECTOMY     Family History  Problem Relation Age of Onset  . Heart disease Mother   . Diabetes Mother   . Congestive Heart Failure Mother   . Asthma Brother   . Throat cancer Brother 73  . Asthma Sister   . Brain cancer Sister 81  . Breast cancer Sister 48  . Colon cancer Neg Hx   . Esophageal cancer Neg Hx   . Stomach cancer Neg Hx   . Pancreatic cancer Neg Hx    Social History   Socioeconomic History  . Marital status: Single    Spouse  name: Not on file  . Number of children: 1  . Years of education: Not on file  . Highest education level: Not on file  Occupational History  . Occupation: retired from worked in Engineer, manufacturing systems: RETIRED  Tobacco Use  . Smoking status: Never Smoker  . Smokeless tobacco: Never Used  Vaping Use  . Vaping Use: Never used  Substance and Sexual Activity  . Alcohol use: No    Alcohol/week: 0.0 standard drinks  . Drug use: No  . Sexual activity: Not Currently  Other Topics Concern  . Not on file  Social History Narrative   Lives alone, indep - never married   g-dtr in [place], siblings, nieces in [place] in touch with a few friends who talk regularly   Social Determinants of Health  Financial Resource Strain: Low Risk   . Difficulty of Paying Living Expenses: Not hard at all  Food Insecurity: No Food Insecurity  . Worried About Charity fundraiser in the Last Year: Never true  . Ran Out of Food in the Last Year: Never true  Transportation Needs: No Transportation Needs  . Lack of Transportation (Medical): No  . Lack of Transportation (Non-Medical): No  Physical Activity: Inactive  . Days of Exercise per Week: 0 days  . Minutes of Exercise per Session: 0 min  Stress: No Stress Concern Present  . Feeling of Stress : Not at all  Social Connections: Socially Isolated  . Frequency of Communication with Friends and Family: More than three times a week  . Frequency of Social Gatherings with Friends and Family: Never  . Attends Religious Services: Never  . Active Member of Clubs or Organizations: No  . Attends Archivist Meetings: Never  . Marital Status: Widowed    Tobacco Counseling Counseling given: Not Answered   Clinical Intake:  Pre-visit preparation completed: Yes  Pain : No/denies pain     Diabetes: No  How often do you need to have someone help you when you read instructions, pamphlets, or other written materials from your doctor or  pharmacy?: 1 - Never What is the last grade level you completed in school?: 1 year of college  Diabetic? no  Interpreter Needed?: No  Information entered by :: Lisette Abu, LPN   Activities of Daily Living In your present state of health, do you have any difficulty performing the following activities: 11/14/2019 04/22/2019  Hearing? N N  Vision? N N  Difficulty concentrating or making decisions? N N  Walking or climbing stairs? N N  Dressing or bathing? N N  Doing errands, shopping? N N  Preparing Food and eating ? N -  Using the Toilet? N -  In the past six months, have you accidently leaked urine? N -  Do you have problems with loss of bowel control? N -  Managing your Medications? N -  Managing your Finances? N -  Housekeeping or managing your Housekeeping? N -  Some recent data might be hidden    Patient Care Team: Hoyt Koch, MD as PCP - General (Internal Medicine) [facility], Armando Reichert, MD (Sleep Medicine) Josue Hector, MD (Cardiology) Gatha Mayer, MD (Gastroenterology) Edrick Oh, MD (Nephrology) Raynelle Bring, MD (Urology) Thornell Sartorius, MD (Otolaryngology) Rutherford Guys, MD (Ophthalmology) Avon Gully, NP (Obstetrics and Gynecology) Edilia Bo, Tracie Harrier, MD (Ophthalmology) Charlotte Crumb, MD (Orthopedic Surgery) Binnie Rail, DC as Referring Physician (Chiropractic Medicine)  Indicate any recent Medical Services you may have received from other than [facility] providers in the past year (date may be approximate).     Assessment:   This is a routine wellness examination for [facility].  Hearing/Vision screen No exam data present  Dietary issues and exercise activities discussed: Current Exercise Habits: The patient does not participate in regular exercise at present, Exercise limited by: None identified  Goals    .  patient (pt-stated)      To keep working on staying on healthy; by learning about good food choices and exercise that  works for you.    .  Patient Stated      Lose weight,  monitor diet closer and continue to exercise. Enjoy life and family.    .  Patient Stated      "Maintain what I'm doing. Will think about diet  plan to lose weight. I do want to get some housework projects completed".      Depression Screen PHQ 2/9 Scores 11/14/2019 04/22/2019 03/05/2018 02/15/2017 11/21/2016 05/07/2015 04/30/2014  PHQ - 2 Score 0 1 0 0 0 0 0  PHQ- 9 Score - - - 0 - - -    Fall Risk Fall Risk  11/14/2019 04/22/2019 03/05/2018 02/15/2017 11/21/2016  Falls in the past year? 0 0 0 No No  Number falls in past yr: 0 0 - - -  Injury with Fall? 0 0 - - -  Risk for fall due to : No Fall Risks - - - -  Follow up Falls evaluation completed - - - -    Any stairs in or around the home? No  If so, are there any without handrails? No  Home free of loose throw rugs in walkways, pet beds, electrical cords, etc? Yes  Adequate lighting in your home to reduce risk of falls? Yes   ASSISTIVE DEVICES UTILIZED TO PREVENT FALLS:  Life alert? No  Use of a cane, walker or w/c? No  Grab bars in the bathroom? No  Shower chair or bench in shower? Yes  Elevated toilet seat or a handicapped toilet? No   TIMED UP AND GO:  Was the test performed? No .  Length of time to ambulate 10 feet: 0 sec.   Gait steady and fast without use of assistive device  Cognitive Function: MMSE - Mini Mental State Exam 02/15/2017 05/07/2015  Not completed: - (No Data)  Orientation to time 5 -  Orientation to Place 5 -  Registration 3 -  Attention/ Calculation 5 -  Recall 2 -  Language- name 2 objects 2 -  Language- repeat 1 -  Language- follow 3 step command 3 -  Language- read & follow direction 1 -  Write a sentence 1 -  Copy design 1 -  Total score 29 -        Immunizations Immunization History  Administered Date(s) Administered  . Fluad Quad(high Dose 65+) 11/09/2018  . Influenza Split 11/30/2010, 11/20/2011  . Influenza Whole 12/12/2006,  11/27/2007, 11/25/2008, 11/26/2009  . Influenza, High Dose Seasonal PF 11/23/2015, 11/20/2017  . Influenza,inj,Quad PF,6+ Mos 11/26/2012, 11/06/2013, 11/19/2014  . PFIZER SARS-COV-2 Vaccination 03/24/2019, 04/14/2019  . Pneumococcal Conjugate-13 12/15/2014  . Pneumococcal Polysaccharide-23 03/16/2009  . Td 02/27/2001  . Tdap 03/21/2012    TDAP status: Up to date Flu Vaccine status: Up to date Pneumococcal vaccine status: Up to date Covid-19 vaccine status: Completed vaccines  Qualifies for Shingles Vaccine? Yes   Zostavax completed No   Shingrix Completed?: No.    Education has been provided regarding the importance of this vaccine. Patient has been advised to call insurance company to determine out of pocket expense if they have not yet received this vaccine. Advised may also receive vaccine at local pharmacy or Health Dept. Verbalized acceptance and understanding.  Screening Tests Health Maintenance  Topic Date Due  . INFLUENZA VACCINE  09/28/2019  . TETANUS/TDAP  03/21/2022  . DEXA SCAN  Completed  . COVID-19 Vaccine  Completed  . PNA vac Low Risk Adult  Completed    Health Maintenance  Health Maintenance Due  Topic Date Due  . INFLUENZA VACCINE  09/28/2019    Colorectal cancer screening: No longer required.  Mammogram status: Completed 08/22/2019. Repeat every year Bone Density status: Completed 07/25/2017. Results reflect: Bone density results: NORMAL. Repeat every 5 years.  Lung Cancer Screening: (Low Dose  CT Chest recommended if Age 69-80 years, 30 pack-year currently smoking OR have quit w/in 15years.) does not qualify.   Lung Cancer Screening Referral: no  Additional Screening:  Hepatitis C Screening: does not qualify; Completed no  Vision Screening: Recommended annual ophthalmology exams for early detection of glaucoma and other disorders of the eye. Is the patient up to date with their annual eye exam?  Yes  Who is the provider or what is the name of the  office in which the patient attends annual eye exams? Rutherford Guys, MD  If pt is not established with a provider, would they like to be referred to a provider to establish care? No .   Dental Screening: Recommended annual dental exams for proper oral hygiene  Community Resource Referral / Chronic Care Management: CRR required this visit?  No   CCM required this visit?  No      Plan:     I have personally reviewed and noted the following in the patient's chart:   . Medical and social history . Use of alcohol, tobacco or illicit drugs  . Current medications and supplements . Functional ability and status . Nutritional status . Physical activity . Advanced directives . List of other physicians . Hospitalizations, surgeries, and ER visits in previous 12 months . Vitals . Screenings to include cognitive, depression, and falls . Referrals and appointments  In addition, I have reviewed and discussed with patient certain preventive protocols, quality metrics, and best practice recommendations. A written personalized care plan for preventive services as well as general preventive health recommendations were provided to patient.     Sheral Flow, LPN   [id number]   Nurse Notes:  Patient is cogitatively intact. There were no vitals filed for this visit. There is no height or weight on file to calculate BMI. Patient stated that she has no issues with gait or balance; does not use any assistive devices.

## 2019-11-28 ENCOUNTER — Other Ambulatory Visit: Payer: Self-pay | Admitting: Internal Medicine

## 2019-12-02 ENCOUNTER — Ambulatory Visit (INDEPENDENT_AMBULATORY_CARE_PROVIDER_SITE_OTHER): Payer: Medicare Other

## 2019-12-02 ENCOUNTER — Other Ambulatory Visit: Payer: Self-pay

## 2019-12-02 DIAGNOSIS — Z23 Encounter for immunization: Secondary | ICD-10-CM | POA: Diagnosis not present

## 2019-12-03 ENCOUNTER — Ambulatory Visit: Payer: Medicare Other

## 2019-12-28 ENCOUNTER — Other Ambulatory Visit: Payer: Self-pay

## 2019-12-28 ENCOUNTER — Encounter (HOSPITAL_COMMUNITY): Payer: Self-pay | Admitting: Emergency Medicine

## 2019-12-28 ENCOUNTER — Emergency Department (HOSPITAL_COMMUNITY)
Admission: EM | Admit: 2019-12-28 | Discharge: 2019-12-28 | Disposition: A | Discharge status: Home / Self Care | Payer: Medicare Other | Attending: Emergency Medicine | Admitting: Emergency Medicine

## 2019-12-28 DIAGNOSIS — N183 Chronic kidney disease, stage 3 unspecified: Secondary | ICD-10-CM | POA: Diagnosis not present

## 2019-12-28 DIAGNOSIS — I129 Hypertensive chronic kidney disease with stage 1 through stage 4 chronic kidney disease, or unspecified chronic kidney disease: Secondary | ICD-10-CM | POA: Insufficient documentation

## 2019-12-28 DIAGNOSIS — E039 Hypothyroidism, unspecified: Secondary | ICD-10-CM | POA: Insufficient documentation

## 2019-12-28 DIAGNOSIS — Z7982 Long term (current) use of aspirin: Secondary | ICD-10-CM | POA: Diagnosis not present

## 2019-12-28 DIAGNOSIS — Z79899 Other long term (current) drug therapy: Secondary | ICD-10-CM | POA: Diagnosis not present

## 2019-12-28 DIAGNOSIS — I10 Essential (primary) hypertension: Secondary | ICD-10-CM

## 2019-12-28 LAB — CBC
HCT: 42.2 % (ref 36.0–46.0)
Hemoglobin: 13.6 g/dL (ref 12.0–15.0)
MCH: 29.1 pg (ref 26.0–34.0)
MCHC: 32.2 g/dL (ref 30.0–36.0)
MCV: 90.2 fL (ref 80.0–100.0)
Platelets: 309 10*3/uL (ref 150–400)
RBC: 4.68 MIL/uL (ref 3.87–5.11)
RDW: 13.2 % (ref 11.5–15.5)
WBC: 8 10*3/uL (ref 4.0–10.5)
nRBC: 0 % (ref 0.0–0.2)

## 2019-12-28 LAB — BASIC METABOLIC PANEL
Anion gap: 13 (ref 5–15)
BUN: 18 mg/dL (ref 8–23)
CO2: 21 mmol/L — ABNORMAL LOW (ref 22–32)
Calcium: 9 mg/dL (ref 8.9–10.3)
Chloride: 104 mmol/L (ref 98–111)
Creatinine, Ser: 1.15 mg/dL — ABNORMAL HIGH (ref 0.44–1.00)
GFR, Estimated: 47 mL/min — ABNORMAL LOW (ref >60)
Glucose, Bld: 103 mg/dL — ABNORMAL HIGH (ref 70–99)
Potassium: 3.3 mmol/L — ABNORMAL LOW (ref 3.5–5.1)
Sodium: 138 mmol/L (ref 135–145)

## 2019-12-28 MED ORDER — LISINOPRIL 10 MG PO TABS
10.0000 mg | ORAL_TABLET | Freq: Once | ORAL | Status: AC
  Administered 2019-12-28: 10 mg via ORAL
  Filled 2019-12-28: qty 1

## 2019-12-28 NOTE — ED Provider Notes (Signed)
[facility] EMERGENCY DEPARTMENT Provider Note   CSN: [id number] Arrival date & time: 12/28/19  1650     History Chief Complaint  Patient presents with  . Hypertension    April Donaldson is a 86 y.o. female history of hypertension, reflux, here presenting with hypertension.  Patient states that she is under a lot of stress recently with her family.  She states that she decided to live a better lifestyle so check her blood pressure and noticed it was 200/100.  She denies any chest pain or shortness of breath or numbness or weakness.  Patient saw her PCP several months ago and is compliant with her atenolol and lisinopril hydrochlorothiazide.  She states that she has not taken her evening dose of lisinopril yet.   The history is provided by the patient.       Past Medical History:  Diagnosis Date  . Anxiety   . Benign neoplasm of kidney, except pelvis   . Cellulitis of leg 03/09/2013 hosp   . Cerebrovascular disease   . Chronic low back pain   . Difficult intubation    per patient with hand surgery in 2016 at surgical center- lip was pinched and swollen after surgery- patient states dr Burney Gauze never stated a problem with intubation   . Diverticulosis   . DJD (degenerative joint disease)   . Dyspnea    with exertion   . GERD (gastroesophageal reflux disease)    no meds  . Glaucoma   . Gout   . H/O hiatal hernia   . Hepatitis    hx of years ago   . Hx of colonic polyps   . Hypercholesteremia   . Hypertension   . Hypothyroid   . IBS (irritable bowel syndrome)   . Internal hemorrhoids   . Obesity   . OSA (obstructive sleep apnea)    NPSG 2009: AHI 20/h. CPAP - follows with [facility]  . Renal insufficiency    one kidney small and non funtioning  . Tubular adenoma 2000  . Venous insufficiency     Patient Active Problem List   Diagnosis Date Noted  . Numbness and tingling in left hand 06/26/2019  . Left shoulder pain 11/28/2018  . Fatigue 02/08/2018    . Right shoulder pain 03/21/2017  . Bilateral pseudophakia 04/10/2016  . Routine general medical examination at a health care facility 06/15/2014  . Primary open angle glaucoma of both eyes, mild stage 07/10/2011  . Obstructive sleep apnea 11/11/2007  . GLAUCOMA 09/22/2007  . CKD (chronic kidney disease) stage 3, GFR 30-59 ml/min (HCC) 03/21/2007  . Hypothyroidism 01/04/2007  . Hyperlipidemia 01/04/2007  . GOUT 01/04/2007  . Morbid obesity ([place]) 01/04/2007  . Essential hypertension 01/04/2007    Past Surgical History:  Procedure Laterality Date  . ABDOMINAL HYSTERECTOMY    . APPENDECTOMY    . CATARACT EXTRACTION, BILATERAL Bilateral 2017  . cataracts Bilateral   . COLONOSCOPY    . CYSTOSCOPY/RETROGRADE/URETEROSCOPY Bilateral 04/09/2017   Procedure: CYSTOSCOPY/RETROGRADE;  Surgeon: Raynelle Bring, MD;  Location: [facility];  Service: Urology;  Laterality: Bilateral;  ONLY NEEDS 30 MIN FOR PROCEDURE  . DILATION AND CURETTAGE OF UTERUS    . FUNCTIONAL ENDOSCOPIC SINUS SURGERY  05/2003   Dr. Ernesto Rutherford  . hysterctomy and ooperectomy  1970's  . MASS EXCISION  2003   lt  . MASS EXCISION Left 09/17/2013   Procedure: EXCISION MASS LEFT HAND;  Surgeon: Schuyler Amor, MD;  Location: [facility];  Service: Orthopedics;  Laterality: Left;  . thyroid lobectomy for a cyst  10/2000   Dr. Ernesto Rutherford  . TONSILLECTOMY       OB History   No obstetric history on file.     Family History  Problem Relation Age of Onset  . Heart disease Mother   . Diabetes Mother   . Congestive Heart Failure Mother   . Asthma Brother   . Throat cancer Brother 38  . Asthma Sister   . Brain cancer Sister 58  . Breast cancer Sister 47  . Colon cancer Neg Hx   . Esophageal cancer Neg Hx   . Stomach cancer Neg Hx   . Pancreatic cancer Neg Hx     Social History   Tobacco Use  . Smoking status: Never Smoker  . Smokeless tobacco: Never Used  Vaping Use  . Vaping Use: Never used  Substance  Use Topics  . Alcohol use: No    Alcohol/week: 0.0 standard drinks  . Drug use: No    Home Medications Prior to Admission medications   Medication Sig Start Date End Date Taking? Authorizing Provider  allopurinol (ZYLOPRIM) 100 MG tablet TAKE 1 TABLET BY MOUTH EVERY DAY 07/07/19   Hoyt Koch, MD  aspirin 81 MG tablet Take 81 mg by mouth daily.      [provider]  atenolol (TENORMIN) 50 MG tablet TAKE 2 TABLETS DAILY 01/07/19   Hoyt Koch, MD  dorzolamide-timolol (COSOPT) 22.3-6.8 MG/ML ophthalmic solution Place 1 drop into both eyes 2 (two) times daily.    [provider]  latanoprost (XALATAN) 0.005 % ophthalmic solution Place 1 drop into both eyes at bedtime.     [provider]  levothyroxine (SYNTHROID) 50 MCG tablet TAKE 1 TABLET BY MOUTH DAILY BEFORE BREAKFAST 11/28/19   Hoyt Koch, MD  lisinopril (ZESTRIL) 10 MG tablet Take 10 mg by mouth as directed. Takes 10mg  in the am and 5mg  at bedtime 02/14/19   [provider]  lisinopril (ZESTRIL) 5 MG tablet Take 5 mg by mouth as directed. Takes 10mg  in the am and 5mg  at bedtime    [provider]  simvastatin (ZOCOR) 20 MG tablet TAKE 1 TABLET DAILY AT 6 P.M. 01/29/19   Hoyt Koch, MD  triamterene-hydrochlorothiazide (DYAZIDE) 37.5-25 MG capsule TAKE 1 CAPSULE BY MOUTH EVERY DAY 06/04/19   Hoyt Koch, MD    Allergies    Amlodipine besylate, Clonidine, Diltiazem hcl, Klonopin [clonazepam], Tetracycline, Doxycycline hyclate, Hydralazine, Iodine, Labetalol hcl, Lidocaine, and Olmesartan medoxomil  Review of Systems   Review of Systems  Cardiovascular: Negative for chest pain.  Neurological: Negative for dizziness, weakness, numbness and headaches.  All other systems reviewed and are negative.   Physical Exam Updated Vital Signs BP (!) 202/95   Pulse (!) 55   Temp 98.3 F (36.8 C) (Oral)   Resp 14   Ht 4\' 11"  (1.499 m)   Wt 81.2 kg    SpO2 100%   BMI 36.15 kg/m   Physical Exam Vitals and nursing note reviewed.  Constitutional:      Appearance: Normal appearance.  HENT:     Head: Normocephalic.     Right Ear: Tympanic membrane normal.     Left Ear: Tympanic membrane normal.     Nose: Nose normal.     Mouth/Throat:     Mouth: Mucous membranes are moist.  Eyes:     Extraocular Movements: Extraocular movements intact.     Pupils:  Pupils are equal, round, and reactive to light.  Cardiovascular:     Rate and Rhythm: Regular rhythm.     Comments: ? 2/6 systolic murmur loudest at the right sternal border Pulmonary:     Effort: Pulmonary effort is normal.  Abdominal:     General: Abdomen is flat.     Palpations: Abdomen is soft.  Musculoskeletal:        General: Normal range of motion.     Cervical back: Normal range of motion.  Skin:    General: Skin is warm.     Capillary Refill: Capillary refill takes less than 2 seconds.  Neurological:     Mental Status: She is alert and oriented to person, place, and time.  Psychiatric:        Mood and Affect: Mood normal.        Behavior: Behavior normal.     ED Results / Procedures / Treatments   Labs (all labs ordered are listed, but only abnormal results are displayed) Labs Reviewed  BASIC METABOLIC PANEL - Abnormal; Notable for the following components:      Result Value   Potassium 3.3 (*)    CO2 21 (*)    Glucose, Bld 103 (*)    Creatinine, Ser 1.15 (*)    GFR, Estimated 47 (*)    All other components within normal limits  CBC    EKG EKG Interpretation  Date/Time:  Sunday December 28 2019 16:59:18 EDT Ventricular Rate:  54 PR Interval:  154 QRS Duration: 80 QT Interval:  440 QTC Calculation: 417 R Axis:   5 Text Interpretation: Sinus bradycardia Cannot rule out Anterior infarct , age undetermined Abnormal ECG No significant change since last tracing Confirmed by Wandra Arthurs [phone]) on 12/28/2019 6:13:04 PM   Radiology No results  found.  Procedures Procedures (including critical care time)  Medications Ordered in ED Medications  lisinopril (ZESTRIL) tablet 10 mg (10 mg Oral Given 12/28/19 1850)    ED Course  I have reviewed the triage vital signs and the nursing notes.  Pertinent labs & imaging results that were available during my care of the patient were reviewed by me and considered in my medical decision making (see chart for details).    MDM Rules/Calculators/A&P                         PRESLI FANGUY is a 86 y.o. female here presenting with hypertension.  Patient has no chest pain with shortness of breath to suggest MI or dissection .  No signs of stroke.  Patient has a history of hypertension is under a lot of stress.  Patient's labs are unremarkable.  Patient was given her home dose of lisinopril and blood pressure went down from 210 to 180.  Of note, I think to hear a systolic murmur.  Patient may have aortic stenosis and cannot get an echo outpatient.   Final Clinical Impression(s) / ED Diagnoses Final diagnoses:  None    Rx / DC Orders ED Discharge Orders    None       Drenda Freeze, MD 12/28/19 1949

## 2019-12-28 NOTE — Discharge Instructions (Addendum)
Increase lisinopril to 10 mg at night from 5 mg and continue your other blood pressure medicines.  Please check your blood pressure in the morning and at night and keep a log  You may have a heart murmur and can get an echo with your doctor  See your doctor for follow-up in a week to recheck blood pressure  Return to ER if you have chest pain or shortness of breath or trouble speaking or weakness.

## 2019-12-28 NOTE — ED Triage Notes (Signed)
Pt c/o hypertension, states she checked her BP at home twice today, first SBP180, second >200. Pt denies chest pain, shortness of breath, headache or visual changes. Denies all complaints, states she has been taking her BP medications as prescribed. A&O x 4.

## 2020-01-01 NOTE — Progress Notes (Signed)
Subjective:    Patient ID: April Donaldson, female    DOB: 1931-10-26, 86 y.o.   MRN: [id number]  HPI The patient is here for follow up from the hospital.    ED 10/31 - 11/1 for hypertension   She had been under a lot of stress with her family.  BP was gradually going up and she was watching it.  She checked her BP on 10/31 and it was 200/100.  She denied CP, SOB, N/T or weakness.  She was taking her daily medications. Labs were unremarkable.  EKG w/o change.  She received her home dose of lisinopril 10 mg and SBP went from 210 to 180.  Her lisinopril was increased from 10 mg in am and 5 mg in pm to 10 mg BID.    Her BP at home since then have been - 126/78-167/96  She is compliant with a low sodium diet.    2/6 sys murmur - ? AS - check Echo   Medications and allergies reviewed with patient and updated if appropriate.  Patient Active Problem List   Diagnosis Date Noted  . Numbness and tingling in left hand 06/26/2019  . Left shoulder pain 11/28/2018  . Fatigue 02/08/2018  . Right shoulder pain 03/21/2017  . Bilateral pseudophakia 04/10/2016  . Routine general medical examination at a health care facility 06/15/2014  . Primary open angle glaucoma of both eyes, mild stage 07/10/2011  . Obstructive sleep apnea 11/11/2007  . GLAUCOMA 09/22/2007  . CKD (chronic kidney disease) stage 3, GFR 30-59 ml/min (HCC) 03/21/2007  . Hypothyroidism 01/04/2007  . Hyperlipidemia 01/04/2007  . GOUT 01/04/2007  . Morbid obesity ([place]) 01/04/2007  . Essential hypertension 01/04/2007    Current Outpatient Medications on File Prior to Visit  Medication Sig Dispense Refill  . allopurinol (ZYLOPRIM) 100 MG tablet TAKE 1 TABLET BY MOUTH EVERY DAY 30 tablet 5  . aspirin 81 MG tablet Take 81 mg by mouth daily.      Marland Kitchen atenolol (TENORMIN) 50 MG tablet TAKE 2 TABLETS DAILY 180 tablet 3  . dorzolamide-timolol (COSOPT) 22.3-6.8 MG/ML ophthalmic solution Place 1 drop into both eyes 2 (two) times  daily.    Marland Kitchen latanoprost (XALATAN) 0.005 % ophthalmic solution Place 1 drop into both eyes at bedtime.     Marland Kitchen levothyroxine (SYNTHROID) 50 MCG tablet TAKE 1 TABLET BY MOUTH DAILY BEFORE BREAKFAST 90 tablet 3  . lisinopril (ZESTRIL) 10 MG tablet Take 10 mg by mouth as directed. Takes 10mg  in the am and 5mg  at bedtime    . simvastatin (ZOCOR) 20 MG tablet TAKE 1 TABLET DAILY AT 6 P.M. 90 tablet 3  . triamterene-hydrochlorothiazide (DYAZIDE) 37.5-25 MG capsule TAKE 1 CAPSULE BY MOUTH EVERY DAY 90 capsule 4  . lisinopril (ZESTRIL) 5 MG tablet Take 5 mg by mouth as directed. Takes 10mg  in the am and 5mg  at bedtime (Patient not taking: Reported on 01/02/2020)     No current facility-administered medications on file prior to visit.    Past Medical History:  Diagnosis Date  . Anxiety   . Benign neoplasm of kidney, except pelvis   . Cellulitis of leg 03/09/2013 hosp   . Cerebrovascular disease   . Chronic low back pain   . Difficult intubation    per patient with hand surgery in 2016 at surgical center- lip was pinched and swollen after surgery- patient states dr Burney Gauze never stated a problem with intubation   . Diverticulosis   . DJD (degenerative  joint disease)   . Dyspnea    with exertion   . GERD (gastroesophageal reflux disease)    no meds  . Glaucoma   . Gout   . H/O hiatal hernia   . Hepatitis    hx of years ago   . Hx of colonic polyps   . Hypercholesteremia   . Hypertension   . Hypothyroid   . IBS (irritable bowel syndrome)   . Internal hemorrhoids   . Obesity   . OSA (obstructive sleep apnea)    NPSG 2009: AHI 20/h. CPAP - follows with [facility]  . Renal insufficiency    one kidney small and non funtioning  . Tubular adenoma 2000  . Venous insufficiency     Past Surgical History:  Procedure Laterality Date  . ABDOMINAL HYSTERECTOMY    . APPENDECTOMY    . CATARACT EXTRACTION, BILATERAL Bilateral 2017  . cataracts Bilateral   . COLONOSCOPY    .  CYSTOSCOPY/RETROGRADE/URETEROSCOPY Bilateral 04/09/2017   Procedure: CYSTOSCOPY/RETROGRADE;  Surgeon: Raynelle Bring, MD;  Location: [facility];  Service: Urology;  Laterality: Bilateral;  ONLY NEEDS 30 MIN FOR PROCEDURE  . DILATION AND CURETTAGE OF UTERUS    . FUNCTIONAL ENDOSCOPIC SINUS SURGERY  05/2003   Dr. Ernesto Rutherford  . hysterctomy and ooperectomy  1970's  . MASS EXCISION  2003   lt  . MASS EXCISION Left 09/17/2013   Procedure: EXCISION MASS LEFT HAND;  Surgeon: Schuyler Amor, MD;  Location: [facility];  Service: Orthopedics;  Laterality: Left;  . thyroid lobectomy for a cyst  10/2000   Dr. Ernesto Rutherford  . TONSILLECTOMY      Social History   Socioeconomic History  . Marital status: Single    Spouse name: Not on file  . Number of children: 1  . Years of education: Not on file  . Highest education level: Not on file  Occupational History  . Occupation: retired from worked in Engineer, manufacturing systems: RETIRED  Tobacco Use  . Smoking status: Never Smoker  . Smokeless tobacco: Never Used  Vaping Use  . Vaping Use: Never used  Substance and Sexual Activity  . Alcohol use: No    Alcohol/week: 0.0 standard drinks  . Drug use: No  . Sexual activity: Not Currently  Other Topics Concern  . Not on file  Social History Narrative   Lives alone, indep - never married   g-dtr in [place], siblings, nieces in [place] in touch with a few friends who talk regularly   Social Determinants of Health   Financial Resource Strain: [place]   . Difficulty of Paying Living Expenses: Not hard at all  Food Insecurity: No Food Insecurity  . Worried About Charity fundraiser in the Last Year: Never true  . Ran Out of Food in the Last Year: Never true  Transportation Needs: No Transportation Needs  . Lack of Transportation (Medical): No  . Lack of Transportation (Non-Medical): No  Physical Activity: Inactive  . Days of Exercise per Week: 0 days  . Minutes of Exercise per  Session: 0 min  Stress: No Stress Concern Present  . Feeling of Stress : Not at all  Social Connections: Socially Isolated  . Frequency of Communication with Friends and Family: More than three times a week  . Frequency of Social Gatherings with Friends and Family: Never  . Attends Religious Services: Never  . Active Member of Clubs or Organizations: No  . Attends Club  or Organization Meetings: Never  . Marital Status: Widowed    Family History  Problem Relation Age of Onset  . Heart disease Mother   . Diabetes Mother   . Congestive Heart Failure Mother   . Asthma Brother   . Throat cancer Brother 62  . Asthma Sister   . Brain cancer Sister 23  . Breast cancer Sister 29  . Colon cancer Neg Hx   . Esophageal cancer Neg Hx   . Stomach cancer Neg Hx   . Pancreatic cancer Neg Hx     Review of Systems  Constitutional: Negative for fever.  Respiratory: Positive for shortness of breath (with moderate exertion - not new).   Cardiovascular: Negative for chest pain, palpitations and leg swelling.  Neurological: Negative for dizziness, light-headedness and headaches.       Objective:   Vitals:   01/02/20 1017  BP: (!) 148/82  Pulse: (!) 58  Temp: 98.1 F (36.7 C)  SpO2: 96%   BP Readings from Last 3 Encounters:  01/02/20 (!) 148/82  12/28/19 (!) 189/76  10/20/19 138/64   Wt Readings from Last 3 Encounters:  01/02/20 179 lb 3.2 oz (81.3 kg)  12/28/19 179 lb (81.2 kg)  10/20/19 183 lb (83 kg)   Body mass index is 36.19 kg/m.   Physical Exam    Constitutional: Appears well-developed and well-nourished. No distress.  HENT:  Head: Normocephalic and atraumatic.  Neck: Neck supple. No tracheal deviation present. No thyromegaly present.  No cervical lymphadenopathy Cardiovascular: Normal rate, regular rhythm and normal heart sounds.   2/6 systolic murmur heard - most prominent at RSB. No carotid bruit .  No edema Pulmonary/Chest: Effort normal and breath sounds normal.  No respiratory distress. No has no wheezes. No rales.  Skin: Skin is warm and dry. Not diaphoretic.  Psychiatric: Normal mood and affect. Behavior is normal.      Assessment & Plan:    See Problem List for Assessment and Plan of chronic medical problems.    This visit occurred during the SARS-CoV-2 public health emergency.  Safety protocols were in place, including screening questions prior to the visit, additional usage of staff PPE, and extensive cleaning of exam room while observing appropriate contact time as indicated for disinfecting solutions.

## 2020-01-01 NOTE — Patient Instructions (Addendum)
  Blood work was ordered.     Medications reviewed and updated.  Changes include :   none  Follow up with Dr Justin Mend.     An ultrasound of your heart was ordered. They will call you to schedule this.

## 2020-01-02 ENCOUNTER — Encounter: Payer: Self-pay | Admitting: Internal Medicine

## 2020-01-02 ENCOUNTER — Ambulatory Visit (INDEPENDENT_AMBULATORY_CARE_PROVIDER_SITE_OTHER): Payer: Medicare Other | Admitting: Internal Medicine

## 2020-01-02 ENCOUNTER — Other Ambulatory Visit: Payer: Self-pay

## 2020-01-02 VITALS — BP 148/82 | HR 58 | Temp 98.1°F | Ht 59.0 in | Wt 179.2 lb

## 2020-01-02 DIAGNOSIS — N1831 Chronic kidney disease, stage 3a: Secondary | ICD-10-CM | POA: Diagnosis not present

## 2020-01-02 DIAGNOSIS — R011 Cardiac murmur, unspecified: Secondary | ICD-10-CM | POA: Insufficient documentation

## 2020-01-02 DIAGNOSIS — I10 Essential (primary) hypertension: Secondary | ICD-10-CM | POA: Diagnosis not present

## 2020-01-02 LAB — BASIC METABOLIC PANEL
BUN: 27 mg/dL — ABNORMAL HIGH (ref 6–23)
CO2: 29 mEq/L (ref 19–32)
Calcium: 9.6 mg/dL (ref 8.4–10.5)
Chloride: 100 mEq/L (ref 96–112)
Creatinine, Ser: 1.27 mg/dL — ABNORMAL HIGH (ref 0.40–1.20)
GFR: 38.44 mL/min — ABNORMAL LOW (ref >60.00)
Glucose, Bld: 87 mg/dL (ref 70–99)
Potassium: 3.9 mEq/L (ref 3.5–5.1)
Sodium: 138 mEq/L (ref 135–145)

## 2020-01-02 NOTE — Assessment & Plan Note (Signed)
Chronic Sees Dr Justin Mend with [facility] Will check BMP since lisinopril was increased - may need to revise medications depending on gfr She has reached out to Dr Justin Mend for his advice

## 2020-01-02 NOTE — Assessment & Plan Note (Signed)
2/6 RSB systolic murmur - probably AS Will check Echo

## 2020-01-02 NOTE — Assessment & Plan Note (Addendum)
Chronic Not ideally controlled Sees Dr Justin Mend with [facility] and ideally she would like him to manage Will check bmp - concern over increase in lisinopril and it having a negative effect on her GFR Continue atenolol 100 mg daily, lisinopril 10 mg BID, dyazide 37.5-25 mg daily Continue to monitor BP at home

## 2020-01-12 ENCOUNTER — Ambulatory Visit: Payer: Medicare Other

## 2020-01-13 ENCOUNTER — Telehealth: Payer: Self-pay | Admitting: Internal Medicine

## 2020-01-13 DIAGNOSIS — J3489 Other specified disorders of nose and nasal sinuses: Secondary | ICD-10-CM

## 2020-01-13 NOTE — Telephone Encounter (Signed)
Please route to provider who saw patient.

## 2020-01-13 NOTE — Telephone Encounter (Signed)
Patient saw Dr. Quay Burow on 11.05.21 and she is still having issues with sinus pressure and states she does see some blood so she wasn't sure if she needed to go see an ENT doctor or what she should do.  Patient # [phone]

## 2020-01-13 NOTE — Telephone Encounter (Signed)
Please advise 

## 2020-01-13 NOTE — Telephone Encounter (Signed)
Referral ordered for ENT>    She should start using saline nasal spray multiple times a day to moisturize and help prevent nose bleeds.  She can also use a humidifier in her bedroom at night and try putting a little vaseline in her nostril that has been bleeding at night - this will help with preventing the bleeding.

## 2020-01-14 ENCOUNTER — Other Ambulatory Visit: Payer: Self-pay | Admitting: Internal Medicine

## 2020-01-14 ENCOUNTER — Other Ambulatory Visit: Payer: Self-pay

## 2020-01-14 MED ORDER — ATENOLOL 50 MG PO TABS
100.0000 mg | ORAL_TABLET | Freq: Every day | ORAL | 3 refills | Status: DC
Start: 2020-01-14 — End: 2020-02-24

## 2020-01-14 NOTE — Telephone Encounter (Signed)
atenolol (TENORMIN) 50 MG tablet [facility] [place] [place] [facility] [place] [facility] Phone:  [phone]  Fax:  [phone]     Requesting a refill  Last seen- 11.05.21 Next appointment-12.21.21

## 2020-01-14 NOTE — Telephone Encounter (Signed)
Spoke with patient and info given 

## 2020-01-19 NOTE — Telephone Encounter (Signed)
    Pharmacy calling to discuss patient allergies Please call [facility]  Ref # [id number]

## 2020-01-26 NOTE — Telephone Encounter (Signed)
Returned pharmacy call for patient allergies however they don't have a record of calling us for this patient.

## 2020-01-27 ENCOUNTER — Ambulatory Visit: Payer: Medicare Other | Attending: Internal Medicine

## 2020-01-27 DIAGNOSIS — Z23 Encounter for immunization: Secondary | ICD-10-CM

## 2020-01-27 NOTE — Progress Notes (Signed)
   Covid-19 Vaccination Clinic  Name:  DEYSY SCHABEL    MRN: [id number] DOB: 1931-11-08  01/27/2020  Ms. Mutchler was observed post Covid-19 immunization for 15 minutes without incident. She was provided with Vaccine Information Sheet and instruction to access the V-Safe system.   Ms. Strauss was instructed to call 911 with any severe reactions post vaccine: Marland Kitchen Difficulty breathing  . Swelling of face and throat  . A fast heartbeat  . A bad rash all over body  . Dizziness and weakness   Immunizations Administered    Name Date Dose VIS Date Route   Pfizer COVID-19 Vaccine 01/27/2020  2:06 PM 0.3 mL 12/17/2019 Intramuscular   Manufacturer: [place]   Lot: [id number]   [place]: [address]-5

## 2020-01-29 ENCOUNTER — Telehealth: Payer: Self-pay

## 2020-01-29 NOTE — Telephone Encounter (Signed)
Attempted to reach pt b/c to inquire if she was having trouble filling the atenolol prescription.

## 2020-01-29 NOTE — Telephone Encounter (Signed)
I'm not sure what the question is, she has been on atenolol for some time without change so yes she should continue this for her blood pressure.

## 2020-01-29 NOTE — Telephone Encounter (Signed)
[facility] stating "your patient has indicated to [place] that they may be allergic to this medication or an ingredient in the medication.  BETA BLOCKERS Do you want [place] to dispense this medication? If yes, an explanation is required.

## 2020-01-30 NOTE — Telephone Encounter (Signed)
LVM with instructions for pt to call office back.

## 2020-02-02 NOTE — Telephone Encounter (Signed)
Can schedule ER follow up if none done yet

## 2020-02-02 NOTE — Telephone Encounter (Signed)
Pt states she has been taking atenolol for some time & has had no problems.   Pt states she had ED visit 12/28/19 & was she may have had a heart murmur & has an ECHO for 12/16/2; states she was told at that time she may need to hold atenolol. Instruction given to continue as prescribed by Dr Sharlet Salina until otherwise told differently.  Pt verb understanding.

## 2020-02-12 ENCOUNTER — Ambulatory Visit (HOSPITAL_COMMUNITY): Payer: Medicare Other | Attending: Cardiology

## 2020-02-12 ENCOUNTER — Other Ambulatory Visit: Payer: Self-pay

## 2020-02-12 DIAGNOSIS — R011 Cardiac murmur, unspecified: Secondary | ICD-10-CM | POA: Diagnosis present

## 2020-02-12 LAB — ECHOCARDIOGRAM COMPLETE
Area-P 1/2: 3.1 cm2
S' Lateral: 3.2 cm

## 2020-02-16 ENCOUNTER — Telehealth: Payer: Self-pay | Admitting: Internal Medicine

## 2020-02-16 NOTE — Telephone Encounter (Signed)
Left message for patient to return call to clinic to discuss results or she could discuss with Dr. Sharlet Salina tomorrow when she comes in for her visit.

## 2020-02-16 NOTE — Telephone Encounter (Signed)
Patient calling to go over her results from 11.05.21. States the VM was short and she has a few questions.  [phone]

## 2020-02-17 ENCOUNTER — Other Ambulatory Visit: Payer: Self-pay

## 2020-02-17 ENCOUNTER — Ambulatory Visit (INDEPENDENT_AMBULATORY_CARE_PROVIDER_SITE_OTHER): Payer: Medicare Other | Admitting: Internal Medicine

## 2020-02-17 ENCOUNTER — Encounter: Payer: Self-pay | Admitting: Internal Medicine

## 2020-02-17 VITALS — BP 148/82 | HR 90 | Temp 97.8°F | Ht 59.0 in | Wt 182.0 lb

## 2020-02-17 DIAGNOSIS — E039 Hypothyroidism, unspecified: Secondary | ICD-10-CM

## 2020-02-17 DIAGNOSIS — E782 Mixed hyperlipidemia: Secondary | ICD-10-CM

## 2020-02-17 DIAGNOSIS — N1832 Chronic kidney disease, stage 3b: Secondary | ICD-10-CM

## 2020-02-17 DIAGNOSIS — I10 Essential (primary) hypertension: Secondary | ICD-10-CM

## 2020-02-17 LAB — LIPID PANEL
Cholesterol: 175 mg/dL (ref 0–200)
HDL: 53.3 mg/dL (ref >39.00)
LDL Cholesterol: 83 mg/dL (ref 0–99)
NonHDL: 121.67
Total CHOL/HDL Ratio: 3
Triglycerides: 194 mg/dL — ABNORMAL HIGH (ref 0.0–149.0)
VLDL: 38.8 mg/dL (ref 0.0–40.0)

## 2020-02-17 LAB — CBC
HCT: 39.7 % (ref 36.0–46.0)
Hemoglobin: 13.2 g/dL (ref 12.0–15.0)
MCHC: 33.3 g/dL (ref 30.0–36.0)
MCV: 89.3 fl (ref 78.0–100.0)
Platelets: 284 10*3/uL (ref 150.0–400.0)
RBC: 4.45 Mil/uL (ref 3.87–5.11)
RDW: 14 % (ref 11.5–15.5)
WBC: 7.9 10*3/uL (ref 4.0–10.5)

## 2020-02-17 LAB — COMPREHENSIVE METABOLIC PANEL
ALT: 18 U/L (ref 0–35)
AST: 19 U/L (ref 0–37)
Albumin: 4.1 g/dL (ref 3.5–5.2)
Alkaline Phosphatase: 62 U/L (ref 39–117)
BUN: 22 mg/dL (ref 6–23)
CO2: 29 mEq/L (ref 19–32)
Calcium: 9.4 mg/dL (ref 8.4–10.5)
Chloride: 104 mEq/L (ref 96–112)
Creatinine, Ser: 1.13 mg/dL (ref 0.40–1.20)
GFR: 44.18 mL/min — ABNORMAL LOW (ref >60.00)
Glucose, Bld: 88 mg/dL (ref 70–99)
Potassium: 4 mEq/L (ref 3.5–5.1)
Sodium: 139 mEq/L (ref 135–145)
Total Bilirubin: 0.5 mg/dL (ref 0.2–1.2)
Total Protein: 7.7 g/dL (ref 6.0–8.3)

## 2020-02-17 LAB — HEMOGLOBIN A1C: Hgb A1c MFr Bld: 5.3 % (ref 4.6–6.5)

## 2020-02-17 NOTE — Progress Notes (Signed)
   Subjective:   Patient ID: April Donaldson, female    DOB: Apr 22, 1931, 86 y.o.   MRN: [id number]  HPI The patient is an 86 YO female coming in for follow up blood pressure (mildly elevated today, seeing nephrology and they have increased her lisinopril in the last several months, she is monitoring at home and not elevated there, she has taken meds today without missing, denies headaches or chest pains) and CKD (recent labs with slight worsening and she does see nephrology and plans to follow up with them, denies missing medications, taking lisinopril and hctz and atenolol, has had intolerances to other medications in the past for blood pressure) and thyroid (taking synthroid and feels good, denies missing doses, denies heat or cold intolerance).   Review of Systems  Constitutional: Negative.   HENT: Negative.   Eyes: Negative.   Respiratory: Negative for cough, chest tightness and shortness of breath.   Cardiovascular: Negative for chest pain, palpitations and leg swelling.  Gastrointestinal: Negative for abdominal distention, abdominal pain, constipation, diarrhea, nausea and vomiting.  Musculoskeletal: Negative.   Skin: Negative.   Neurological: Negative.   Psychiatric/Behavioral: Negative.     Objective:  Physical Exam Constitutional:      Appearance: She is well-developed and well-nourished. She is obese.  HENT:     Head: Normocephalic and atraumatic.  Eyes:     Extraocular Movements: EOM normal.  Cardiovascular:     Rate and Rhythm: Normal rate and regular rhythm.  Pulmonary:     Effort: Pulmonary effort is normal. No respiratory distress.     Breath sounds: Normal breath sounds. No wheezing or rales.  Abdominal:     General: Bowel sounds are normal. There is no distension.     Palpations: Abdomen is soft.     Tenderness: There is no abdominal tenderness. There is no rebound.  Musculoskeletal:        General: No edema.     Cervical back: Normal range of motion.  Skin:     General: Skin is warm and dry.  Neurological:     Mental Status: She is alert and oriented to person, place, and time.     Coordination: Coordination normal.  Psychiatric:        Mood and Affect: Mood and affect normal.     Vitals:   02/17/20 1025 02/17/20 1058  BP: (!) 158/90 (!) 148/82  Pulse: 90   Temp: 97.8 F (36.6 C)   TempSrc: Oral   SpO2: 96%   Weight: 182 lb (82.6 kg)   Height: 4\' 11"  (1.499 m)    This visit occurred during the SARS-CoV-2 public health emergency.  Safety protocols were in place, including screening questions prior to the visit, additional usage of staff PPE, and extensive cleaning of exam room while observing appropriate contact time as indicated for disinfecting solutions.   Assessment & Plan:

## 2020-02-17 NOTE — Patient Instructions (Signed)
Work on adding exercise 3-5 times a week to stabilize the blood pressure.   DASH Eating Plan DASH stands for "Dietary Approaches to Stop Hypertension." The DASH eating plan is a healthy eating plan that has been shown to reduce high blood pressure (hypertension). It may also reduce your risk for type 2 diabetes, heart disease, and stroke. The DASH eating plan may also help with weight loss. What are tips for following this plan?  General guidelines  Avoid eating more than 2,300 mg (milligrams) of salt (sodium) a day. If you have hypertension, you may need to reduce your sodium intake to 1,500 mg a day.  Limit alcohol intake to no more than 1 drink a day for nonpregnant women and 2 drinks a day for men. One drink equals 12 oz of beer, 5 oz of wine, or 1 oz of hard liquor.  Work with your health care provider to maintain a healthy body weight or to lose weight. Ask what an ideal weight is for you.  Get at least 30 minutes of exercise that causes your heart to beat faster (aerobic exercise) most days of the week. Activities may include walking, swimming, or biking.  Work with your health care provider or diet and nutrition specialist (dietitian) to adjust your eating plan to your individual calorie needs. Reading food labels   Check food labels for the amount of sodium per serving. Choose foods with less than 5 percent of the Daily Value of sodium. Generally, foods with less than 300 mg of sodium per serving fit into this eating plan.  To find whole grains, look for the word "whole" as the first word in the ingredient list. Shopping  Buy products labeled as "low-sodium" or "no salt added."  Buy fresh foods. Avoid canned foods and premade or frozen meals. Cooking  Avoid adding salt when cooking. Use salt-free seasonings or herbs instead of table salt or sea salt. Check with your health care provider or pharmacist before using salt substitutes.  Do not fry foods. Cook foods using healthy  methods such as baking, boiling, grilling, and broiling instead.  Cook with heart-healthy oils, such as olive, canola, soybean, or sunflower oil. Meal planning  Eat a balanced diet that includes: ? 5 or more servings of fruits and vegetables each day. At each meal, try to fill half of your plate with fruits and vegetables. ? Up to 6-8 servings of whole grains each day. ? Less than 6 oz of lean meat, poultry, or fish each day. A 3-oz serving of meat is about the same size as a deck of cards. One egg equals 1 oz. ? 2 servings of low-fat dairy each day. ? A serving of nuts, seeds, or beans 5 times each week. ? Heart-healthy fats. Healthy fats called Omega-3 fatty acids are found in foods such as flaxseeds and coldwater fish, like sardines, salmon, and mackerel.  Limit how much you eat of the following: ? Canned or prepackaged foods. ? Food that is high in trans fat, such as fried foods. ? Food that is high in saturated fat, such as fatty meat. ? Sweets, desserts, sugary drinks, and other foods with added sugar. ? Full-fat dairy products.  Do not salt foods before eating.  Try to eat at least 2 vegetarian meals each week.  Eat more home-cooked food and less restaurant, buffet, and fast food.  When eating at a restaurant, ask that your food be prepared with less salt or no salt, if possible. What foods are  recommended? The items listed may not be a complete list. Talk with your dietitian about what dietary choices are best for you. Grains Whole-grain or whole-wheat bread. Whole-grain or whole-wheat pasta. Brown rice. [place]. Bulgur. Whole-grain and low-sodium cereals. Pita bread. Low-fat, low-sodium crackers. Whole-wheat flour tortillas. Vegetables Fresh or frozen vegetables (raw, steamed, roasted, or grilled). Low-sodium or reduced-sodium tomato and vegetable juice. Low-sodium or reduced-sodium tomato sauce and tomato paste. Low-sodium or reduced-sodium canned  vegetables. Fruits All fresh, dried, or frozen fruit. Canned fruit in natural juice (without added sugar). Meat and other protein foods Skinless chicken or [place]. Ground chicken or [place]. Pork with fat trimmed off. Fish and seafood. Egg whites. Dried beans, peas, or lentils. Unsalted nuts, nut butters, and seeds. Unsalted canned beans. Lean cuts of beef with fat trimmed off. Low-sodium, lean deli meat. Dairy Low-fat (1%) or fat-free (skim) milk. Fat-free, low-fat, or reduced-fat cheeses. Nonfat, low-sodium ricotta or cottage cheese. Low-fat or nonfat yogurt. Low-fat, low-sodium cheese. Fats and oils Soft margarine without trans fats. Vegetable oil. Low-fat, reduced-fat, or light mayonnaise and salad dressings (reduced-sodium). Canola, safflower, olive, soybean, and sunflower oils. Avocado. Seasoning and other foods Herbs. Spices. Seasoning mixes without salt. Unsalted popcorn and pretzels. Fat-free sweets. What foods are not recommended? The items listed may not be a complete list. Talk with your dietitian about what dietary choices are best for you. Grains Baked goods made with fat, such as croissants, muffins, or some breads. Dry pasta or rice meal packs. Vegetables Creamed or fried vegetables. Vegetables in a cheese sauce. Regular canned vegetables (not low-sodium or reduced-sodium). Regular canned tomato sauce and paste (not low-sodium or reduced-sodium). Regular tomato and vegetable juice (not low-sodium or reduced-sodium). Angie Fava. Olives. Fruits Canned fruit in a light or heavy syrup. Fried fruit. Fruit in cream or butter sauce. Meat and other protein foods Fatty cuts of meat. Ribs. Fried meat. Berniece Salines. Sausage. Bologna and other processed lunch meats. Salami. Fatback. Hotdogs. Bratwurst. Salted nuts and seeds. Canned beans with added salt. Canned or smoked fish. Whole eggs or egg yolks. Chicken or [place] with skin. Dairy Whole or 2% milk, cream, and half-and-half. Whole or full-fat  cream cheese. Whole-fat or sweetened yogurt. Full-fat cheese. Nondairy creamers. Whipped toppings. Processed cheese and cheese spreads. Fats and oils Butter. Stick margarine. Lard. Shortening. Ghee. Bacon fat. Tropical oils, such as coconut, palm kernel, or palm oil. Seasoning and other foods Salted popcorn and pretzels. Onion salt, garlic salt, seasoned salt, table salt, and sea salt. Worcestershire sauce. Tartar sauce. Barbecue sauce. Teriyaki sauce. Soy sauce, including reduced-sodium. Steak sauce. Canned and packaged gravies. Fish sauce. Oyster sauce. Cocktail sauce. Horseradish that you find on the shelf. Ketchup. Mustard. Meat flavorings and tenderizers. Bouillon cubes. Hot sauce and Tabasco sauce. Premade or packaged marinades. Premade or packaged taco seasonings. Relishes. Regular salad dressings. Where to find more information:  National Heart, Lung, and [place]: https://wilson-eaton.com/  American Heart Association: www.heart.org Summary  The DASH eating plan is a healthy eating plan that has been shown to reduce high blood pressure (hypertension). It may also reduce your risk for type 2 diabetes, heart disease, and stroke.  With the DASH eating plan, you should limit salt (sodium) intake to 2,300 mg a day. If you have hypertension, you may need to reduce your sodium intake to 1,500 mg a day.  When on the DASH eating plan, aim to eat more fresh fruits and vegetables, whole grains, lean proteins, low-fat dairy, and heart-healthy fats.  Work with your health  care provider or diet and nutrition specialist (dietitian) to adjust your eating plan to your individual calorie needs. This information is not intended to replace advice given to you by your health care provider. Make sure you discuss any questions you have with your health care provider. Document Revised: 01/26/2017 Document Reviewed: 02/07/2016 Elsevier Patient Education  2020 [employer].

## 2020-02-18 NOTE — Assessment & Plan Note (Signed)
Taking simvastatin 20 mg daily and checking lipid panel and adjust as needed.  

## 2020-02-18 NOTE — Assessment & Plan Note (Signed)
Seeing nephrology and slight worsening with recent labs, rechecking today and adjust as needed.

## 2020-02-18 NOTE — Assessment & Plan Note (Signed)
BP mildly elevated here and she wishes to have nephrology adjust if needed. More controlled at home. Checking CMP today for any progression of CKD stage 3 currently.

## 2020-02-18 NOTE — Assessment & Plan Note (Signed)
Recent labs normal and no indication for change synthroid 50 mcg daily and she is advised to continue taking.

## 2020-02-24 ENCOUNTER — Telehealth: Payer: Self-pay | Admitting: Internal Medicine

## 2020-02-24 MED ORDER — ATENOLOL 50 MG PO TABS: 100.0000 mg | ORAL_TABLET | Freq: Every day | ORAL | 3 refills | Status: DC

## 2020-02-24 NOTE — Telephone Encounter (Signed)
atenolol (TENORMIN) 50 MG tablet [facility] calling asking for a refill or a whole new prescription to be sent in for this medication.  [facility] [place], [place] - [address] AT Portal to Registered [facility] Sites Phone:  [phone]  Fax:  [phone]

## 2020-02-24 NOTE — Telephone Encounter (Signed)
Rx sent. See meds.  

## 2020-03-25 NOTE — Telephone Encounter (Deleted)
No note needed 

## 2020-04-14 ENCOUNTER — Other Ambulatory Visit: Payer: Self-pay

## 2020-04-14 MED ORDER — SIMVASTATIN 20 MG PO TABS: ORAL_TABLET | ORAL | 3 refills | Status: DC

## 2020-04-19 ENCOUNTER — Ambulatory Visit: Payer: Medicare Other | Admitting: Family

## 2020-05-06 ENCOUNTER — Other Ambulatory Visit: Payer: Self-pay | Admitting: Internal Medicine

## 2020-08-09 ENCOUNTER — Other Ambulatory Visit: Payer: Self-pay | Admitting: Internal Medicine

## 2020-08-17 ENCOUNTER — Ambulatory Visit: Payer: Medicare Other | Admitting: Internal Medicine

## 2020-08-17 ENCOUNTER — Other Ambulatory Visit: Payer: Self-pay

## 2020-09-22 ENCOUNTER — Encounter: Payer: Self-pay | Admitting: Internal Medicine

## 2020-09-22 ENCOUNTER — Other Ambulatory Visit: Payer: Self-pay

## 2020-09-22 ENCOUNTER — Ambulatory Visit (INDEPENDENT_AMBULATORY_CARE_PROVIDER_SITE_OTHER): Payer: Medicare Other | Admitting: Internal Medicine

## 2020-09-22 VITALS — BP 126/74 | HR 61 | Temp 98.4°F | Resp 18 | Ht 59.0 in | Wt 179.8 lb

## 2020-09-22 DIAGNOSIS — M1A00X0 Idiopathic chronic gout, unspecified site, without tophus (tophi): Secondary | ICD-10-CM

## 2020-09-22 DIAGNOSIS — Z0000 Encounter for general adult medical examination without abnormal findings: Secondary | ICD-10-CM | POA: Diagnosis not present

## 2020-09-22 DIAGNOSIS — N1832 Chronic kidney disease, stage 3b: Secondary | ICD-10-CM | POA: Diagnosis not present

## 2020-09-22 NOTE — Assessment & Plan Note (Signed)
Checking CMP and adjust as needed. No diabetes and BP at goal.

## 2020-09-22 NOTE — Progress Notes (Signed)
   Subjective:   Patient ID: April Donaldson, female    DOB: 1931-08-07, 87 y.o.   MRN: [id number]  HPI The patient is an 87 YO female coming in for physical.   PMH, [place], social history reviewed and updated  Review of Systems  Constitutional: Negative.   HENT: Negative.    Eyes: Negative.   Respiratory:  Negative for cough, chest tightness and shortness of breath.   Cardiovascular:  Negative for chest pain, palpitations and leg swelling.  Gastrointestinal:  Negative for abdominal distention, abdominal pain, constipation, diarrhea, nausea and vomiting.  Musculoskeletal:  Positive for arthralgias.  Skin: Negative.   Neurological: Negative.   Psychiatric/Behavioral: Negative.     Objective:  Physical Exam Constitutional:      Appearance: She is well-developed. She is obese.  HENT:     Head: Normocephalic and atraumatic.  Cardiovascular:     Rate and Rhythm: Normal rate and regular rhythm.  Pulmonary:     Effort: Pulmonary effort is normal. No respiratory distress.     Breath sounds: Normal breath sounds. No wheezing or rales.  Abdominal:     General: Bowel sounds are normal. There is no distension.     Palpations: Abdomen is soft.     Tenderness: There is no abdominal tenderness. There is no rebound.  Musculoskeletal:        General: Tenderness present.     Cervical back: Normal range of motion.  Skin:    General: Skin is warm and dry.  Neurological:     Mental Status: She is alert and oriented to person, place, and time.     Coordination: Coordination normal.    Vitals:   09/22/20 1038  BP: 126/74  Pulse: 61  Resp: 18  Temp: 98.4 F (36.9 C)  TempSrc: Oral  SpO2: 96%  Weight: 179 lb 12.8 oz (81.6 kg)  Height: '4\' 11"'$  (1.499 m)    This visit occurred during the SARS-CoV-2 public health emergency.  Safety protocols were in place, including screening questions prior to the visit, additional usage of staff PPE, and extensive cleaning of exam room while observing  appropriate contact time as indicated for disinfecting solutions.   Assessment & Plan:

## 2020-09-22 NOTE — Patient Instructions (Signed)
Think about getting the shingles shot.

## 2020-09-24 NOTE — Assessment & Plan Note (Signed)
Flu shot yearly. Covid-19 counseled about booster. Pneumonia complete. Shingrix counseled to get at pharmacy. Tetanus due 2024. Colonoscopy aged out. Mammogram due 2023, pap smear aged out and dexa declines further last 2019. Counseled about sun safety and mole surveillance. Counseled about the dangers of distracted driving. Given 10 year screening recommendations.

## 2020-09-24 NOTE — Assessment & Plan Note (Signed)
BMI 36 but complicated by gout, hyperlipidemia, hypertension. Counseled on diet and exercise.

## 2020-09-24 NOTE — Assessment & Plan Note (Signed)
Checking uric acid and adjust allopurinol 100 mg daily for goal <6. No flare recently.

## 2020-10-25 ENCOUNTER — Ambulatory Visit (INDEPENDENT_AMBULATORY_CARE_PROVIDER_SITE_OTHER): Payer: Medicare Other | Admitting: Pulmonary Disease

## 2020-10-25 ENCOUNTER — Other Ambulatory Visit: Payer: Self-pay

## 2020-10-25 ENCOUNTER — Encounter: Payer: Self-pay | Admitting: Pulmonary Disease

## 2020-10-25 VITALS — BP 138/80 | HR 54 | Temp 97.8°F | Ht 59.0 in | Wt 180.6 lb

## 2020-10-25 DIAGNOSIS — Z9989 Dependence on other enabling machines and devices: Secondary | ICD-10-CM | POA: Diagnosis not present

## 2020-10-25 DIAGNOSIS — R0600 Dyspnea, unspecified: Secondary | ICD-10-CM | POA: Diagnosis not present

## 2020-10-25 DIAGNOSIS — I5032 Chronic diastolic (congestive) heart failure: Secondary | ICD-10-CM

## 2020-10-25 DIAGNOSIS — G4733 Obstructive sleep apnea (adult) (pediatric): Secondary | ICD-10-CM | POA: Diagnosis not present

## 2020-10-25 DIAGNOSIS — R0609 Other forms of dyspnea: Secondary | ICD-10-CM

## 2020-10-25 NOTE — Progress Notes (Signed)
[facility]  Chief Complaint  Patient presents with   Follow-up    Pt states mild SOB when bringing in grocery and vacuuming cleaning. Have to pause to catch breath. Pt states suddenly wake up out of sleep.    Constitutional:  BP 138/80 (BP Location: Left Arm, Patient Position: Sitting, Cuff Size: Normal)   Pulse (!) 54   Temp 97.8 F (36.6 C) (Oral)   Ht '4\' 11"'$  (1.499 m)   Wt 180 lb 9.6 oz (81.9 kg)   SpO2 99%   BMI 36.48 kg/m   Past Medical History:  IBS, Hypothyroidism, HTN, HLD, Colon polyps, HH, Gout, Glaucoma, GERD, CVA, Anxiety, Cellulitis Lt leg 2015, Chronic back pain, Difficult intubation  Past Surgical History:  She  has a past surgical history that includes hysterctomy and ooperectomy (1970's); thyroid lobectomy for a cyst (10/2000); Functional endoscopic sinus surgery (05/2003); Colonoscopy; Tonsillectomy; Appendectomy; Dilation and curettage of uterus; Mass excision (2003); Mass excision (Left, 09/17/2013); cataracts (Bilateral); Abdominal hysterectomy; Cystoscopy/retrograde/ureteroscopy (Bilateral, 04/09/2017); and Cataract extraction, bilateral (Bilateral, 2017).  Brief Summary:  April Donaldson is a 87 y.o. female with obstructive sleep apnea.      Subjective:   She has been getting shortness of breath when walking too much or doing activities like vacuuming.  She had labs and Echo in December.  Lab tests were unrevealing.  Echo showed grade 2 diastolic CHF.  She uses CPAP on regular basis.  She was told by her DME that her old mask was no longer available.  New mask type is too stiff and causes more leak.  Physical Exam:   Appearance - well kempt   ENMT - no sinus tenderness, no oral exudate, no LAN, Mallampati 3 airway, no stridor  Respiratory - equal breath sounds bilaterally, no wheezing or rales  CV - s1s2 regular rate and rhythm, no murmurs  Ext - no clubbing, no edema  Skin - no rashes  Psych - normal  mood and affect   Sleep Tests:  PSG 10/14/07 >> AHI 20 Auto CPAP 03/14/20 to 06/11/20 >> used on 71 of 90 nights with average 5 hrs 25 min.  Average AHI 0.3 with median CPAP 9 and 95 th percentile CPAP 12 cm H2O  Cardiac Tests:  Echo 02/12/20 >> EF 55 to 60%, grade 2 DD  Social History:  She  reports that she has never smoked. She has never used smokeless tobacco. She reports that she does not drink alcohol and does not use drugs.  Family History:  Her family history includes Asthma in her brother and sister; Brain cancer (age of onset: 33) in her sister; Breast cancer (age of onset: 19) in her sister; Congestive Heart Failure in her mother; Diabetes in her mother; Heart disease in her mother; Throat cancer (age of onset: 7) in her brother.     Assessment/Plan:   Obstructive sleep apnea. - she is compliant with CPAP and reports benefit - gets supplies from [employer] - continue auto CPAP 5 to 15 cm H2O - will arrange for mask refitting   Obesity with deconditioning. - encouraged her to maintain a regular exercise regimen  Dyspnea on exertion. - likely from diastolic CHF and deconditioning - of note is that she has relatively low resting heart rate and might need further assessment of this if her symptoms persist  Time Spent Involved in Patient Care on Day of Examination:  32 minutes  Follow up:   Patient Instructions  Will  arrange for CPAP mask refitting  Follow up in 1 year  Medication List:   Allergies as of 10/25/2020       Reactions   Amlodipine Besylate Swelling, Other (See Comments)   Causes swelling   Clonidine Nausea Only, Other (See Comments)   Sweating, felt faint   Diltiazem Hcl Swelling, Other (See Comments)   meds did not work for her----causes swelling   Klonopin [clonazepam] Other (See Comments)   Vivid dreams   Tetracycline Swelling   Doxycycline Hyclate Other (See Comments)   REACTION:  tetracycline   Hydralazine Rash   Iodine Other (See Comments)    IVP contrast Pt. States topical iodine is fine to use without any irritation. H.Mickley RN   Labetalol Hcl Other (See Comments)   Unknown   Lidocaine Rash   Olmesartan Medoxomil Other (See Comments)   Unknown        Medication List        Accurate as of October 25, 2020 11:30 AM. If you have any questions, ask your nurse or doctor.          allopurinol 100 MG tablet Commonly known as: ZYLOPRIM TAKE 1 TABLET BY MOUTH EVERY DAY   aspirin 81 MG tablet Take 81 mg by mouth daily.   atenolol 50 MG tablet Commonly known as: TENORMIN Take 2 tablets (100 mg total) by mouth daily.   dorzolamide-timolol 22.3-6.8 MG/ML ophthalmic solution Commonly known as: COSOPT Place 1 drop into both eyes 2 (two) times daily.   latanoprost 0.005 % ophthalmic solution Commonly known as: XALATAN Place 1 drop into both eyes at bedtime.   levothyroxine 50 MCG tablet Commonly known as: SYNTHROID TAKE 1 TABLET BY MOUTH DAILY BEFORE BREAKFAST   lisinopril 10 MG tablet Commonly known as: ZESTRIL Take 10 mg by mouth as directed. Takes '10mg'$  in the am and '5mg'$  at bedtime   simvastatin 20 MG tablet Commonly known as: ZOCOR TAKE 1 TABLET DAILY AT 6 P.M.   triamterene-hydrochlorothiazide 37.5-25 MG capsule Commonly known as: DYAZIDE TAKE 1 CAPSULE BY MOUTH EVERY DAY        Signature:  Chesley Mires, MD [place] Pager - [phone] 10/25/2020, 11:30 AM

## 2020-10-25 NOTE — Patient Instructions (Signed)
Will arrange for CPAP mask refitting  Follow up in 1 year 

## 2020-11-06 ENCOUNTER — Other Ambulatory Visit: Payer: Self-pay | Admitting: Internal Medicine

## 2020-11-10 ENCOUNTER — Ambulatory Visit (INDEPENDENT_AMBULATORY_CARE_PROVIDER_SITE_OTHER): Payer: Medicare Other

## 2020-11-10 ENCOUNTER — Other Ambulatory Visit: Payer: Self-pay

## 2020-11-10 DIAGNOSIS — Z23 Encounter for immunization: Secondary | ICD-10-CM | POA: Diagnosis not present

## 2020-11-10 NOTE — Progress Notes (Signed)
High Dose flu vacc was given w/o any complications. 

## 2021-01-17 ENCOUNTER — Ambulatory Visit (INDEPENDENT_AMBULATORY_CARE_PROVIDER_SITE_OTHER): Payer: Medicare Other

## 2021-01-17 ENCOUNTER — Other Ambulatory Visit: Payer: Self-pay

## 2021-01-17 VITALS — BP 130/80 | HR 60 | Temp 98.2°F | Ht 59.0 in | Wt 177.0 lb

## 2021-01-17 DIAGNOSIS — Z0000 Encounter for general adult medical examination without abnormal findings: Secondary | ICD-10-CM

## 2021-01-17 NOTE — Progress Notes (Signed)
Subjective:   TOMMA EHINGER is a 87 y.o. female who presents for Medicare Annual (Subsequent) preventive examination.  Review of Systems     Cardiac Risk Factors include: advanced age (>81men, >43 women);dyslipidemia;family history of premature cardiovascular disease;hypertension;obesity (BMI >30kg/m2)     Objective:    Today's Vitals   01/17/21 1121  BP: 130/80  Pulse: 60  Temp: 98.2 F (36.8 C)  SpO2: 96%  Weight: 177 lb (80.3 kg)  Height: 4\' 11"  (1.499 m)  PainSc: 0-No pain   Body mass index is 35.75 kg/m.  Advanced Directives 01/17/2021 11/14/2019 03/05/2018 05/16/2017 04/09/2017 02/15/2017 06/22/2016  Does Patient Have a Medical Advance Directive? Yes Yes Yes No Yes Yes No  Type of Advance Directive Living will;Healthcare Power of [place];Living will - Healthcare Power of Secretary;Living will -  Does patient want to make changes to medical advance directive? No - Patient declined No - Patient declined - - - - -  Copy of [place] in Chart? No - copy requested - No - copy requested - No - copy requested No - copy requested -  Would patient like information on creating a medical advance directive? - - - Yes (ED - Information included in AVS) - - -  Pre-existing out of facility DNR order (yellow form or pink MOST form) - - - - - - -    Current Medications (verified) Outpatient Encounter Medications as of 01/17/2021  Medication Sig   allopurinol (ZYLOPRIM) 100 MG tablet TAKE 1 TABLET BY MOUTH EVERY DAY   aspirin 81 MG tablet Take 81 mg by mouth daily.   atenolol (TENORMIN) 50 MG tablet Take 2 tablets (100 mg total) by mouth daily.   dorzolamide-timolol (COSOPT) 22.3-6.8 MG/ML ophthalmic solution Place 1 drop into both eyes 2 (two) times daily.   latanoprost (XALATAN) 0.005 % ophthalmic solution Place 1 drop into both eyes at bedtime.   levothyroxine (SYNTHROID) 50 MCG tablet TAKE 1 TABLET BY MOUTH  DAILY BEFORE BREAKFAST   lisinopril (ZESTRIL) 10 MG tablet Take 10 mg by mouth as directed. Takes 10mg  in the am and 5mg  at bedtime   simvastatin (ZOCOR) 20 MG tablet TAKE 1 TABLET DAILY AT 6 P.M.   triamterene-hydrochlorothiazide (DYAZIDE) 37.5-25 MG capsule TAKE 1 CAPSULE BY MOUTH EVERY DAY   No facility-administered encounter medications on file as of 01/17/2021.    Allergies (verified) Amlodipine besylate, Clonidine, Diltiazem hcl, Klonopin [clonazepam], Tetracycline, Doxycycline hyclate, Hydralazine, Iodine, Labetalol hcl, Lidocaine, and Olmesartan medoxomil   History: Past Medical History:  Diagnosis Date   Anxiety    Benign neoplasm of kidney, except pelvis    Cellulitis of leg 03/09/2013 hosp    Cerebrovascular disease    Chronic low back pain    Difficult intubation    per patient with hand surgery in 2016 at surgical center- lip was pinched and swollen after surgery- patient states dr Burney Gauze never stated a problem with intubation    Diverticulosis    DJD (degenerative joint disease)    Dyspnea    with exertion    GERD (gastroesophageal reflux disease)    no meds   Glaucoma    Gout    H/O hiatal hernia    Hepatitis    hx of years ago    Hx of colonic polyps    Hypercholesteremia    Hypertension    Hypothyroid    IBS (irritable bowel syndrome)    Internal hemorrhoids  Obesity    OSA (obstructive sleep apnea)    NPSG 2009: AHI 20/h. CPAP - follows with Clance   Renal insufficiency    one kidney small and non funtioning   Tubular adenoma 2000   Venous insufficiency    Past Surgical History:  Procedure Laterality Date   ABDOMINAL HYSTERECTOMY     APPENDECTOMY     CATARACT EXTRACTION, BILATERAL Bilateral 2017   cataracts Bilateral    COLONOSCOPY     CYSTOSCOPY/RETROGRADE/URETEROSCOPY Bilateral 04/09/2017   Procedure: CYSTOSCOPY/RETROGRADE;  Surgeon: Raynelle Bring, MD;  Location: [facility];  Service: Urology;  Laterality: Bilateral;  ONLY NEEDS 30 MIN FOR  PROCEDURE   DILATION AND CURETTAGE OF UTERUS     FUNCTIONAL ENDOSCOPIC SINUS SURGERY  05/2003   Dr. Ernesto Rutherford   hysterctomy and ooperectomy  1970's   MASS EXCISION  2003   lt   MASS EXCISION Left 09/17/2013   Procedure: EXCISION MASS LEFT HAND;  Surgeon: Schuyler Amor, MD;  Location: [place];  Service: Orthopedics;  Laterality: Left;   thyroid lobectomy for a cyst  10/2000   Dr. Ernesto Rutherford   TONSILLECTOMY     Family History  Problem Relation Age of Onset   Heart disease Mother    Diabetes Mother    Congestive Heart Failure Mother    Asthma Brother    Throat cancer Brother 60   Asthma Sister    Brain cancer Sister 38   Breast cancer Sister 36   Colon cancer Neg Hx    Esophageal cancer Neg Hx    Stomach cancer Neg Hx    Pancreatic cancer Neg Hx    Social History   Socioeconomic History   Marital status: Single    Spouse name: Not on file   Number of children: 1   Years of education: Not on file   Highest education level: Not on file  Occupational History   Occupation: retired from worked in Engineer, manufacturing systems: RETIRED  Tobacco Use   Smoking status: Never   Smokeless tobacco: Never  Vaping Use   Vaping Use: Never used  Substance and Sexual Activity   Alcohol use: No    Alcohol/week: 0.0 standard drinks   Drug use: No   Sexual activity: Not Currently  Other Topics Concern   Not on file  Social History Narrative   Lives alone, indep - never married   g-dtr in [place], siblings, nieces in town   Keeps in Retail buyer with a few friends who talk regularly   Social Determinants of Health   Financial Resource Strain: Low Risk    Difficulty of Paying Living Expenses: Not hard at all  Food Insecurity: No Food Insecurity   Worried About Charity fundraiser in the Last Year: Never true   Arboriculturist in the Last Year: Never true  Transportation Needs: No Transportation Needs   Lack of Transportation (Medical): No   Lack of Transportation  (Non-Medical): No  Physical Activity: Sufficiently Active   Days of Exercise per Week: 5 days   Minutes of Exercise per Session: 30 min  Stress: No Stress Concern Present   Feeling of Stress : Not at all  Social Connections: Moderately Isolated   Frequency of Communication with Friends and Family: More than three times a week   Frequency of Social Gatherings with Friends and Family: More than three times a week   Attends Religious Services: More than 4 times per year   Active  Member of Clubs or Organizations: No   Attends Archivist Meetings: Never   Marital Status: Widowed    Tobacco Counseling Counseling given: Not Answered   Clinical Intake:  Pre-visit preparation completed: Yes  Pain : No/denies pain Pain Score: 0-No pain     BMI - recorded: 35.75 Nutritional Status: BMI > 30  Obese Nutritional Risks: None Diabetes: No  How often do you need to have someone help you when you read instructions, pamphlets, or other written materials from your doctor or pharmacy?: 1 - Never What is the last grade level you completed in school?: HSG; 1 year of college  Diabetic? no  Interpreter Needed?: No  Information entered by :: Lisette Abu, LPN   Activities of Daily Living In your present state of health, do you have any difficulty performing the following activities: 01/17/2021  Hearing? N  Vision? N  Difficulty concentrating or making decisions? N  Walking or climbing stairs? N  Dressing or bathing? N  Doing errands, shopping? N  Preparing Food and eating ? N  Using the Toilet? N  In the past six months, have you accidently leaked urine? N  Do you have problems with loss of bowel control? N  Managing your Medications? N  Managing your Finances? N  Housekeeping or managing your Housekeeping? N  Some recent data might be hidden    Patient Care Team: Hoyt Koch, MD as PCP - General (Internal Medicine) Clance, Armando Reichert, MD (Sleep  Medicine) Josue Hector, MD (Cardiology) Gatha Mayer, MD (Gastroenterology) Edrick Oh, MD (Nephrology) Raynelle Bring, MD (Urology) Thornell Sartorius, MD (Otolaryngology) Rutherford Guys, MD (Ophthalmology) Avon Gully, NP (Obstetrics and Gynecology) Edilia Bo, Tracie Harrier, MD (Ophthalmology) Charlotte Crumb, MD (Orthopedic Surgery) Binnie Rail, DC as Referring Physician (Chiropractic Medicine)  Indicate any recent Medical Services you may have received from other than [facility] providers in the past year (date may be approximate).     Assessment:   This is a routine wellness examination for [facility]  Hearing/Vision screen No results found.  Dietary issues and exercise activities discussed: Current Exercise Habits: Home exercise routine, Type of exercise: walking, Time (Minutes): 30, Frequency (Times/Week): 5, Weekly Exercise (Minutes/Week): 150, Intensity: Moderate, Exercise limited by: None identified   Goals Addressed   None   Depression Screen PHQ 2/9 Scores 01/17/2021 11/14/2019 04/22/2019 03/05/2018 02/15/2017 11/21/2016 05/07/2015  PHQ - 2 Score 0 0 1 0 0 0 0  PHQ- 9 Score - - - - 0 - -    Fall Risk Fall Risk  01/17/2021 11/14/2019 04/22/2019 03/05/2018 02/15/2017  Falls in the past year? 0 0 0 0 No  Number falls in past yr: 0 0 0 - -  Injury with Fall? 0 0 0 - -  Risk for fall due to : No Fall Risks No Fall Risks - - -  Follow up Falls evaluation completed Falls evaluation completed - - -    FALL RISK PREVENTION PERTAINING TO THE HOME:  Any stairs in or around the home? Yes  If so, are there any without handrails? No  Home free of loose throw rugs in walkways, pet beds, electrical cords, etc? Yes  Adequate lighting in your home to reduce risk of falls? Yes   ASSISTIVE DEVICES UTILIZED TO PREVENT FALLS:  Life alert? Yes  Use of a cane, walker or w/c? No  Grab bars in the bathroom? No  Shower chair or bench in shower? Yes  Elevated toilet seat or a  handicapped  toilet? No   TIMED UP AND GO:  Was the test performed? Yes .  Length of time to ambulate 10 feet: 6 sec.   Gait steady and fast without use of assistive device  Cognitive Function: Normal cognitive status assessed by direct observation by this Nurse Health Advisor. No abnormalities found.   MMSE - Mini Mental State Exam 02/15/2017 05/07/2015  Not completed: - (No Data)  Orientation to time 5 -  Orientation to Place 5 -  Registration 3 -  Attention/ Calculation 5 -  Recall 2 -  Language- name 2 objects 2 -  Language- repeat 1 -  Language- follow 3 step command 3 -  Language- read & follow direction 1 -  Write a sentence 1 -  Copy design 1 -  Total score 29 -        Immunizations Immunization History  Administered Date(s) Administered   Fluad Quad(high Dose 65+) 11/09/2018, 12/02/2019, 11/10/2020   Influenza Split 11/30/2010, 11/20/2011   Influenza Whole 12/12/2006, 11/27/2007, 11/25/2008, 11/26/2009   Influenza, High Dose Seasonal PF 11/23/2015, 11/20/2017   Influenza,inj,Quad PF,6+ Mos 11/26/2012, 11/06/2013, 11/19/2014   PFIZER(Purple Top)SARS-COV-2 Vaccination 03/24/2019, 04/14/2019, 01/27/2020   Pneumococcal Conjugate-13 12/15/2014   Pneumococcal Polysaccharide-23 03/16/2009   Td 02/27/2001   Tdap 03/21/2012    TDAP status: Up to date  Flu Vaccine status: Up to date  Pneumococcal vaccine status: Up to date  Covid-19 vaccine status: Completed vaccines  Qualifies for Shingles Vaccine? Yes   Zostavax completed No   Shingrix Completed?: No.    Education has been provided regarding the importance of this vaccine. Patient has been advised to call insurance company to determine out of pocket expense if they have not yet received this vaccine. Advised may also receive vaccine at local pharmacy or Health Dept. Verbalized acceptance and understanding.  Screening Tests Health Maintenance  Topic Date Due   Zoster Vaccines- Shingrix (1 of 2) Never done   COVID-19  Vaccine (4 - Booster for Pfizer series) 03/23/2020   MAMMOGRAM  08/27/2021   TETANUS/TDAP  03/21/2022   Pneumonia Vaccine 61+ Years old  Completed   INFLUENZA VACCINE  Completed   DEXA SCAN  Completed   HPV VACCINES  Aged Out    Health Maintenance  Health Maintenance Due  Topic Date Due   Zoster Vaccines- Shingrix (1 of 2) Never done   COVID-19 Vaccine (4 - Booster for Pfizer series) 03/23/2020    Colorectal cancer screening: No longer required.   Mammogram status: Completed 09/08/2020. Repeat every year  Bone Density status: Completed 07/25/2017. Results reflect: Bone density results: NORMAL. Repeat every 5 years.  Lung Cancer Screening: (Low Dose CT Chest recommended if Age 32-80 years, 30 pack-year currently smoking OR have quit w/in 15years.) does not qualify.   Lung Cancer Screening Referral: no  Additional Screening:  Hepatitis C Screening: does not qualify; Completed no  Vision Screening: Recommended annual ophthalmology exams for early detection of glaucoma and other disorders of the eye. Is the patient up to date with their annual eye exam?  Yes  Who is the provider or what is the name of the office in which the patient attends annual eye exams? Raynelle Fanning, MD If pt is not established with a provider, would they like to be referred to a provider to establish care? No .   Dental Screening: Recommended annual dental exams for proper oral hygiene  Community Resource Referral / Chronic Care Management: CRR required this visit?  No  CCM required this visit?  No      Plan:     I have personally reviewed and noted the following in the patient's chart:   Medical and social history Use of alcohol, tobacco or illicit drugs  Current medications and supplements including opioid prescriptions.  Functional ability and status Nutritional status Physical activity Advanced directives List of other physicians Hospitalizations, surgeries, and ER visits in previous 12  months Vitals Screenings to include cognitive, depression, and falls Referrals and appointments  In addition, I have reviewed and discussed with patient certain preventive protocols, quality metrics, and best practice recommendations. A written personalized care plan for preventive services as well as general preventive health recommendations were provided to patient.     Sheral Flow, LPN   [id number]   Nurse Notes:  Hearing Screening - Comments:: Patient denied any hearing difficulty.   No hearing aids.  Vision Screening - Comments:: Patient wears corrective glasses/contacts.  Eye exam done annually by: Raynelle Fanning, MD.

## 2021-01-17 NOTE — Patient Instructions (Signed)
April Donaldson , Thank you for taking time to come for your Medicare Wellness Visit. I appreciate your ongoing commitment to your health goals. Please review the following plan we discussed and let me know if I can assist you in the future.   Screening recommendations/referrals: Colonoscopy: Not a candidate for screening due to age 86: 09/08/2020; due every 1-2 years Bone Density: 07/25/2017; due every 5 years (normal) Recommended yearly ophthalmology/optometry visit for glaucoma screening and checkup Recommended yearly dental visit for hygiene and checkup  Vaccinations: Influenza vaccine: 11/10/2020 Pneumococcal vaccine: 03/16/2009, 12/15/2014 Tdap vaccine: 03/21/2012; due every 10 years Shingles vaccine: never done; please check with pharmacy after 02/27/2021 for new coverage and price.   Covid-19:03/24/2019, 04/14/2019, 01/27/2020  Advanced directives: Please bring a copy of your health care power of attorney and living will to the office at your convenience.  Conditions/risks identified: Yes; Client understands the importance of follow-up with providers by attending scheduled visits and discussed goals to eat healthier, increase physical activity, exercise the brain, socialize more, get enough sleep and make time for laughter.  Next appointment: Please schedule your next Medicare Wellness Visit with your Nurse Health Advisor in 1 year by calling [phone].   Preventive Care 36 Years and Older, Female Preventive care refers to lifestyle choices and visits with your health care provider that can promote health and wellness. What does preventive care include? A yearly physical exam. This is also called an annual well check. Dental exams once or twice a year. Routine eye exams. Ask your health care provider how often you should have your eyes checked. Personal lifestyle choices, including: Daily care of your teeth and gums. Regular physical activity. Eating a healthy diet. Avoiding  tobacco and drug use. Limiting alcohol use. Practicing safe sex. Taking low-dose aspirin every day. Taking vitamin and mineral supplements as recommended by your health care provider. What happens during an annual well check? The services and screenings done by your health care provider during your annual well check will depend on your age, overall health, lifestyle risk factors, and family history of disease. Counseling  Your health care provider may ask you questions about your: Alcohol use. Tobacco use. Drug use. Emotional well-being. Home and relationship well-being. Sexual activity. Eating habits. History of falls. Memory and ability to understand (cognition). Work and work Statistician. Reproductive health. Screening  You may have the following tests or measurements: Height, weight, and BMI. Blood pressure. Lipid and cholesterol levels. These may be checked every 5 years, or more frequently if you are over 5 years old. Skin check. Lung cancer screening. You may have this screening every year starting at age 35 if you have a 30-pack-year history of smoking and currently smoke or have quit within the past 15 years. Fecal occult blood test (FOBT) of the stool. You may have this test every year starting at age 69. Flexible sigmoidoscopy or colonoscopy. You may have a sigmoidoscopy every 5 years or a colonoscopy every 10 years starting at age 2. Hepatitis C blood test. Hepatitis B blood test. Sexually transmitted disease (STD) testing. Diabetes screening. This is done by checking your blood sugar (glucose) after you have not eaten for a while (fasting). You may have this done every 1-3 years. Bone density scan. This is done to screen for osteoporosis. You may have this done starting at age 8. Mammogram. This may be done every 1-2 years. Talk to your health care provider about how often you should have regular mammograms. Talk with your health care  provider about your test  results, treatment options, and if necessary, the need for more tests. Vaccines  Your health care provider may recommend certain vaccines, such as: Influenza vaccine. This is recommended every year. Tetanus, diphtheria, and acellular pertussis (Tdap, Td) vaccine. You may need a Td booster every 10 years. Zoster vaccine. You may need this after age 29. Pneumococcal 13-valent conjugate (PCV13) vaccine. One dose is recommended after age 25. Pneumococcal polysaccharide (PPSV23) vaccine. One dose is recommended after age 46. Talk to your health care provider about which screenings and vaccines you need and how often you need them. This information is not intended to replace advice given to you by your health care provider. Make sure you discuss any questions you have with your health care provider. Document Released: 03/12/2015 Document Revised: 11/03/2015 Document Reviewed: 12/15/2014 Elsevier Interactive Patient Education  2017 [employer] Prevention in the Home Falls can cause injuries. They can happen to people of all ages. There are many things you can do to make your home safe and to help prevent falls. What can I do on the outside of my home? Regularly fix the edges of walkways and driveways and fix any cracks. Remove anything that might make you trip as you walk through a door, such as a raised step or threshold. Trim any bushes or trees on the path to your home. Use bright outdoor lighting. Clear any walking paths of anything that might make someone trip, such as rocks or tools. Regularly check to see if handrails are loose or broken. Make sure that both sides of any steps have handrails. Any raised decks and porches should have guardrails on the edges. Have any leaves, snow, or ice cleared regularly. Use sand or salt on walking paths during winter. Clean up any spills in your garage right away. This includes oil or grease spills. What can I do in the bathroom? Use night  lights. Install grab bars by the toilet and in the tub and shower. Do not use towel bars as grab bars. Use non-skid mats or decals in the tub or shower. If you need to sit down in the shower, use a plastic, non-slip stool. Keep the floor dry. Clean up any water that spills on the floor as soon as it happens. Remove soap buildup in the tub or shower regularly. Attach bath mats securely with double-sided non-slip rug tape. Do not have throw rugs and other things on the floor that can make you trip. What can I do in the bedroom? Use night lights. Make sure that you have a light by your bed that is easy to reach. Do not use any sheets or blankets that are too big for your bed. They should not hang down onto the floor. Have a firm chair that has side arms. You can use this for support while you get dressed. Do not have throw rugs and other things on the floor that can make you trip. What can I do in the kitchen? Clean up any spills right away. Avoid walking on wet floors. Keep items that you use a lot in easy-to-reach places. If you need to reach something above you, use a strong step stool that has a grab bar. Keep electrical cords out of the way. Do not use floor polish or wax that makes floors slippery. If you must use wax, use non-skid floor wax. Do not have throw rugs and other things on the floor that can make you trip. What can I  do with my stairs? Do not leave any items on the stairs. Make sure that there are handrails on both sides of the stairs and use them. Fix handrails that are broken or loose. Make sure that handrails are as long as the stairways. Check any carpeting to make sure that it is firmly attached to the stairs. Fix any carpet that is loose or worn. Avoid having throw rugs at the top or bottom of the stairs. If you do have throw rugs, attach them to the floor with carpet tape. Make sure that you have a light switch at the top of the stairs and the bottom of the stairs. If  you do not have them, ask someone to add them for you. What else can I do to help prevent falls? Wear shoes that: Do not have high heels. Have rubber bottoms. Are comfortable and fit you well. Are closed at the toe. Do not wear sandals. If you use a stepladder: Make sure that it is fully opened. Do not climb a closed stepladder. Make sure that both sides of the stepladder are locked into place. Ask someone to hold it for you, if possible. Clearly mark and make sure that you can see: Any grab bars or handrails. First and last steps. Where the edge of each step is. Use tools that help you move around (mobility aids) if they are needed. These include: Canes. Walkers. Scooters. Crutches. Turn on the lights when you go into a dark area. Replace any light bulbs as soon as they burn out. Set up your furniture so you have a clear path. Avoid moving your furniture around. If any of your floors are uneven, fix them. If there are any pets around you, be aware of where they are. Review your medicines with your doctor. Some medicines can make you feel dizzy. This can increase your chance of falling. Ask your doctor what other things that you can do to help prevent falls. This information is not intended to replace advice given to you by your health care provider. Make sure you discuss any questions you have with your health care provider. Document Released: 12/10/2008 Document Revised: 07/22/2015 Document Reviewed: 03/20/2014 Elsevier Interactive Patient Education  2017 [employer].

## 2021-01-25 ENCOUNTER — Other Ambulatory Visit: Payer: Self-pay | Admitting: Internal Medicine

## 2021-02-09 ENCOUNTER — Other Ambulatory Visit: Payer: Self-pay | Admitting: Internal Medicine

## 2021-02-11 ENCOUNTER — Other Ambulatory Visit: Payer: Self-pay

## 2021-02-11 ENCOUNTER — Encounter: Payer: Self-pay | Admitting: Internal Medicine

## 2021-02-11 ENCOUNTER — Ambulatory Visit (INDEPENDENT_AMBULATORY_CARE_PROVIDER_SITE_OTHER): Payer: Medicare Other | Admitting: Internal Medicine

## 2021-02-11 ENCOUNTER — Ambulatory Visit (INDEPENDENT_AMBULATORY_CARE_PROVIDER_SITE_OTHER): Payer: Medicare Other

## 2021-02-11 VITALS — BP 152/80 | HR 56 | Temp 98.1°F | Ht 59.0 in | Wt 180.2 lb

## 2021-02-11 DIAGNOSIS — R079 Chest pain, unspecified: Secondary | ICD-10-CM | POA: Insufficient documentation

## 2021-02-11 DIAGNOSIS — N1832 Chronic kidney disease, stage 3b: Secondary | ICD-10-CM

## 2021-02-11 DIAGNOSIS — R739 Hyperglycemia, unspecified: Secondary | ICD-10-CM

## 2021-02-11 DIAGNOSIS — I5189 Other ill-defined heart diseases: Secondary | ICD-10-CM

## 2021-02-11 DIAGNOSIS — E039 Hypothyroidism, unspecified: Secondary | ICD-10-CM

## 2021-02-11 DIAGNOSIS — I10 Essential (primary) hypertension: Secondary | ICD-10-CM

## 2021-02-11 DIAGNOSIS — E538 Deficiency of other specified B group vitamins: Secondary | ICD-10-CM | POA: Diagnosis not present

## 2021-02-11 DIAGNOSIS — E559 Vitamin D deficiency, unspecified: Secondary | ICD-10-CM

## 2021-02-11 DIAGNOSIS — R0600 Dyspnea, unspecified: Secondary | ICD-10-CM | POA: Diagnosis not present

## 2021-02-11 DIAGNOSIS — E782 Mixed hyperlipidemia: Secondary | ICD-10-CM

## 2021-02-11 LAB — LIPID PANEL
Cholesterol: 185 mg/dL (ref 0–200)
HDL: 65.3 mg/dL (ref >39.00)
LDL Cholesterol: 92 mg/dL (ref 0–99)
NonHDL: 119.76
Total CHOL/HDL Ratio: 3
Triglycerides: 140 mg/dL (ref 0.0–149.0)
VLDL: 28 mg/dL (ref 0.0–40.0)

## 2021-02-11 LAB — CBC WITH DIFFERENTIAL/PLATELET
Basophils Absolute: 0.1 10*3/uL (ref 0.0–0.1)
Basophils Relative: 1 % (ref 0.0–3.0)
Eosinophils Absolute: 0.1 10*3/uL (ref 0.0–0.7)
Eosinophils Relative: 1.4 % (ref 0.0–5.0)
HCT: 43.3 % (ref 36.0–46.0)
Hemoglobin: 14.1 g/dL (ref 12.0–15.0)
Lymphocytes Relative: 40.1 % (ref 12.0–46.0)
Lymphs Abs: 3.1 10*3/uL (ref 0.7–4.0)
MCHC: 32.6 g/dL (ref 30.0–36.0)
MCV: 91.3 fl (ref 78.0–100.0)
Monocytes Absolute: 0.7 10*3/uL (ref 0.1–1.0)
Monocytes Relative: 9.5 % (ref 3.0–12.0)
Neutro Abs: 3.7 10*3/uL (ref 1.4–7.7)
Neutrophils Relative %: 48 % (ref 43.0–77.0)
Platelets: 262 10*3/uL (ref 150.0–400.0)
RBC: 4.74 Mil/uL (ref 3.87–5.11)
RDW: 13.7 % (ref 11.5–15.5)
WBC: 7.7 10*3/uL (ref 4.0–10.5)

## 2021-02-11 LAB — URINALYSIS, ROUTINE W REFLEX MICROSCOPIC
Bilirubin Urine: NEGATIVE
Ketones, ur: NEGATIVE
Leukocytes,Ua: NEGATIVE
Nitrite: NEGATIVE
RBC / HPF: NONE SEEN (ref 0–?)
Specific Gravity, Urine: 1.01 (ref 1.000–1.030)
Total Protein, Urine: 30 — AB
Urine Glucose: NEGATIVE
Urobilinogen, UA: 0.2 (ref 0.0–1.0)
pH: 6 (ref 5.0–8.0)

## 2021-02-11 LAB — HEPATIC FUNCTION PANEL
ALT: 19 U/L (ref 0–35)
AST: 27 U/L (ref 0–37)
Albumin: 4.3 g/dL (ref 3.5–5.2)
Alkaline Phosphatase: 65 U/L (ref 39–117)
Bilirubin, Direct: 0.1 mg/dL (ref 0.0–0.3)
Total Bilirubin: 0.6 mg/dL (ref 0.2–1.2)
Total Protein: 8.2 g/dL (ref 6.0–8.3)

## 2021-02-11 LAB — PHOSPHORUS: Phosphorus: 3.5 mg/dL (ref 2.3–4.6)

## 2021-02-11 LAB — VITAMIN D 25 HYDROXY (VIT D DEFICIENCY, FRACTURES): VITD: 46.41 ng/mL (ref 30.00–100.00)

## 2021-02-11 LAB — BASIC METABOLIC PANEL
BUN: 25 mg/dL — ABNORMAL HIGH (ref 6–23)
CO2: 27 mEq/L (ref 19–32)
Calcium: 9.9 mg/dL (ref 8.4–10.5)
Chloride: 103 mEq/L (ref 96–112)
Creatinine, Ser: 1.21 mg/dL — ABNORMAL HIGH (ref 0.40–1.20)
GFR: 40.42 mL/min — ABNORMAL LOW (ref >60.00)
Glucose, Bld: 81 mg/dL (ref 70–99)
Potassium: 3.2 mEq/L — ABNORMAL LOW (ref 3.5–5.1)
Sodium: 140 mEq/L (ref 135–145)

## 2021-02-11 LAB — BRAIN NATRIURETIC PEPTIDE: Pro B Natriuretic peptide (BNP): 41 pg/mL (ref 0.0–100.0)

## 2021-02-11 LAB — VITAMIN B12: Vitamin B-12: 419 pg/mL (ref 211–911)

## 2021-02-11 LAB — TSH: TSH: 3.07 u[IU]/mL (ref 0.35–5.50)

## 2021-02-11 LAB — HEMOGLOBIN A1C: Hgb A1c MFr Bld: 5.3 % (ref 4.6–6.5)

## 2021-02-11 NOTE — Progress Notes (Signed)
Patient ID: April Donaldson, female   DOB: 01-07-1932, 87 y.o.   MRN: [id number]        Chief Complaint: follow up HTN, dyspnea, CP       HPI:  April Donaldson is a 87 y.o. female here with c/o not being able to understand the meaning of her recent echo with gr 2 Diast Dysfunction as no one fully explained that she could understand, but simply seemed to brush it off as a non issue.  Also hearing heart beats in both ears at times and wondering if this is something significant, and Does have several wks ongoing nasal allergy symptoms with clearish congestion, itch and sneezing, without fever, pain, ST, cough, swelling or wheezing.  BP has been mildly elevated at home despite good med compliance, has not noted bradycardia but HR often in the 50s.  C/o persistent unexplained dyspnea on exertion more than 3-6 mo ago, now having noticeable symptoms going up 1 flight of stairs.  Als not associated with this, but also having left chest pain under the left breast, dull, intermittent, non exertional nonpleuritic, nonpositional, and not always assoc with sob, palps, n/c, diaphoresis, or dizziness. Can last minutes.  Nothing really seems to make it better or worse.  Wonders is she needs to see cardiology.  Has seen pulmonary for OSA without good explanation for dyspnea per pt.  Echo dec 2021 with normal EF and gr 2 diast dysfxn.  Has not had recent CXR, declines PFTs for now, no hx of tobacco use.   Hs f/u appt with PCP next month but felt she couldn't wait that long.       Wt Readings from Last 3 Encounters:  02/11/21 180 lb 3.2 oz (81.7 kg)  01/17/21 177 lb (80.3 kg)  10/25/20 180 lb 9.6 oz (81.9 kg)   BP Readings from Last 3 Encounters:  02/11/21 (!) 152/80  01/17/21 130/80  10/25/20 138/80         Past Medical History:  Diagnosis Date   Anxiety    Benign neoplasm of kidney, except pelvis    Cellulitis of leg 03/09/2013 hosp    Cerebrovascular disease    Chronic low back pain    Difficult intubation     per patient with hand surgery in 2016 at surgical center- lip was pinched and swollen after surgery- patient states dr Burney Gauze never stated a problem with intubation    Diverticulosis    DJD (degenerative joint disease)    Dyspnea    with exertion    GERD (gastroesophageal reflux disease)    no meds   Glaucoma    Gout    H/O hiatal hernia    Hepatitis    hx of years ago    Hx of colonic polyps    Hypercholesteremia    Hypertension    Hypothyroid    IBS (irritable bowel syndrome)    Internal hemorrhoids    Obesity    OSA (obstructive sleep apnea)    NPSG 2009: AHI 20/h. CPAP - follows with [facility]   Renal insufficiency    one kidney small and non funtioning   Tubular adenoma 2000   Venous insufficiency    Past Surgical History:  Procedure Laterality Date   ABDOMINAL HYSTERECTOMY     APPENDECTOMY     CATARACT EXTRACTION, BILATERAL Bilateral 2017   cataracts Bilateral    COLONOSCOPY     CYSTOSCOPY/RETROGRADE/URETEROSCOPY Bilateral 04/09/2017   Procedure: CYSTOSCOPY/RETROGRADE;  Surgeon: Raynelle Bring, MD;  Location: [facility];  Service: Urology;  Laterality: Bilateral;  ONLY NEEDS 30 MIN FOR PROCEDURE   DILATION AND CURETTAGE OF UTERUS     FUNCTIONAL ENDOSCOPIC SINUS SURGERY  05/2003   Dr. Ernesto Rutherford   hysterctomy and ooperectomy  1970's   MASS EXCISION  2003   lt   MASS EXCISION Left 09/17/2013   Procedure: EXCISION MASS LEFT HAND;  Surgeon: Schuyler Amor, MD;  Location: [facility];  Service: Orthopedics;  Laterality: Left;   thyroid lobectomy for a cyst  10/2000   Dr. Ernesto Rutherford   TONSILLECTOMY      reports that she has never smoked. She has never used smokeless tobacco. She reports that she does not drink alcohol and does not use drugs. family history includes Asthma in her brother and sister; Brain cancer (age of onset: 83) in her sister; Breast cancer (age of onset: 69) in her sister; Congestive Heart Failure in her mother; Diabetes in her mother; Heart  disease in her mother; Throat cancer (age of onset: 58) in her brother. Allergies  Allergen Reactions   Amlodipine Besylate Swelling and Other (See Comments)    Causes swelling   Clonidine Nausea Only and Other (See Comments)    Sweating, felt faint   Diltiazem Hcl Swelling and Other (See Comments)    meds did not work for her----causes swelling   Klonopin [Clonazepam] Other (See Comments)    Vivid dreams   Tetracycline Swelling   Doxycycline Hyclate Other (See Comments)    REACTION:  tetracycline   Hydralazine Rash   Iodine Other (See Comments)    IVP contrast Pt. States topical iodine is fine to use without any irritation. H.Mickley RN   Labetalol Hcl Other (See Comments)    Unknown   Lidocaine Rash   Olmesartan Medoxomil Other (See Comments)    Unknown   Current Outpatient Medications on File Prior to Visit  Medication Sig Dispense Refill   allopurinol (ZYLOPRIM) 100 MG tablet TAKE 1 TABLET BY MOUTH EVERY DAY 30 tablet 5   aspirin 81 MG tablet Take 81 mg by mouth daily.     atenolol (TENORMIN) 50 MG tablet TAKE 2 TABLETS DAILY 180 tablet 3   dorzolamide-timolol (COSOPT) 22.3-6.8 MG/ML ophthalmic solution Place 1 drop into both eyes 2 (two) times daily.     latanoprost (XALATAN) 0.005 % ophthalmic solution Place 1 drop into both eyes at bedtime.     levothyroxine (SYNTHROID) 50 MCG tablet TAKE 1 TABLET BY MOUTH DAILY BEFORE BREAKFAST 90 tablet 3   lisinopril (ZESTRIL) 10 MG tablet Take 10 mg by mouth as directed. Takes 10mg  in the am and 5mg  at bedtime     simvastatin (ZOCOR) 20 MG tablet TAKE 1 TABLET DAILY AT 6 P.M. 90 tablet 3   triamterene-hydrochlorothiazide (DYAZIDE) 37.5-25 MG capsule TAKE 1 CAPSULE BY MOUTH EVERY DAY 90 capsule 1   No current facility-administered medications on file prior to visit.        ROS:  All others reviewed and negative.  Objective        PE:  BP (!) 152/80 (BP Location: Left Arm)    Pulse (!) 56    Temp 98.1 F (36.7 C) (Oral)    Ht 4'  11" (1.499 m)    Wt 180 lb 3.2 oz (81.7 kg)    BMI 36.40 kg/m                 Constitutional: Pt appears in NAD  HENT: Head: NCAT.                Right Ear: External ear normal.                 Left Ear: External ear normal.                Eyes: . Pupils are equal, round, and reactive to light. Conjunctivae and EOM are normal               Nose: without d/c or deformity               Neck: Neck supple. Gross normal ROM               Cardiovascular: Normal rate and regular rhythm.                 Pulmonary/Chest: Effort normal and breath sounds without rales or wheezing.                Abd:  Soft, NT, ND, + BS, no organomegaly               Neurological: Pt is alert. At baseline orientation, motor grossly intact               Skin: Skin is warm. No rashes, no other new lesions, LE edema - none               Psychiatric: Pt behavior is normal without agitation   Micro: none  Cardiac tracings I have personally interpreted today:  ECG - Sinus bradycardia 49  Pertinent Radiological findings (summarize): none   Lab Results  Component Value Date   WBC 7.7 02/11/2021   HGB 14.1 02/11/2021   HCT 43.3 02/11/2021   PLT 262.0 02/11/2021   GLUCOSE 81 02/11/2021   CHOL 185 02/11/2021   TRIG 140.0 02/11/2021   HDL 65.30 02/11/2021   LDLCALC 92 02/11/2021   ALT 19 02/11/2021   AST 27 02/11/2021   NA 140 02/11/2021   K 3.2 (L) 02/11/2021   CL 103 02/11/2021   CREATININE 1.21 (H) 02/11/2021   BUN 25 (H) 02/11/2021   CO2 27 02/11/2021   TSH 3.07 02/11/2021   HGBA1C 5.3 02/11/2021   Assessment/Plan:  April Donaldson is a 87 y.o. Black or African American [2] female with  has a past medical history of Anxiety, Benign neoplasm of kidney, except pelvis, Cellulitis of leg (03/09/2013 hosp ), Cerebrovascular disease, Chronic low back pain, Difficult intubation, Diverticulosis, DJD (degenerative joint disease), Dyspnea, GERD (gastroesophageal reflux disease), Glaucoma, Gout, H/O hiatal  hernia, Hepatitis, colonic polyps, Hypercholesteremia, Hypertension, Hypothyroid, IBS (irritable bowel syndrome), Internal hemorrhoids, Obesity, OSA (obstructive sleep apnea), Renal insufficiency, Tubular adenoma (2000), and Venous insufficiency.  Chest pain Atypical, etiology unclear, ecg reviewed, to continue current med tx, refer cardiology  Dyspnea Etiology unclear, exam benign, may have elements of anxiety, advanced age, deconditioning - declines PFTs for now, but for labs including cbc today,  for cxr and f/u pulm and pcp as planned  Hypothyroidism Lab Results  Component Value Date   TSH 3.07 02/11/2021   Stable, pt to continue levothyroxine   Hyperlipidemia Lab Results  Component Value Date   LDLCALC 92 02/11/2021   Stable, pt to continue current statin zocor   Essential hypertension BP Readings from Last 3 Encounters:  02/11/21 (!) 152/80  01/17/21 130/80  10/25/20 138/80   Elevated today but ? Reactive with anxiety, pt to continue medical treatment  tenormin as declines change   CKD (chronic kidney disease) stage 3, GFR 30-59 ml/min Lab Results  Component Value Date   CREATININE 1.21 (H) 02/11/2021   Stable overall, cont to avoid nephrotoxins   Grade II diastolic dysfunction D/w pt meaning of DD, pt has no evidence for volume retention, cont current med tx  Followup: Return if symptoms worsen or fail to improve.  Cathlean Cower, MD 02/13/2021 5:21 AM [facility]

## 2021-02-11 NOTE — Patient Instructions (Addendum)
Your EKG was done today  Please continue all other medications as before, and refills have been done if requested.  Please have the pharmacy call with any other refills you may need.  Please continue your efforts at being more active, low cholesterol diet, and weight control=  Please keep your appointments with your specialists as you may have planned - your kidney doctor soon  You will be contacted regarding the referral for: cardiology  Please go to the XRAY Department in the first floor for the x-ray testing  Please go to the LAB at the blood drawing area for the tests to be done  You will be contacted by phone if any changes need to be made immediately.  Otherwise, you will receive a letter about your results with an explanation, but please check with MyChart first.  Please remember to sign up for MyChart if you have not done so, as this will be important to you in the future with finding out test results, communicating by private email, and scheduling acute appointments online when needed.  Please see Dr Sharlet Salina next month as you have planned

## 2021-02-12 ENCOUNTER — Encounter: Payer: Self-pay | Admitting: Internal Medicine

## 2021-02-13 ENCOUNTER — Encounter: Payer: Self-pay | Admitting: Internal Medicine

## 2021-02-13 DIAGNOSIS — I5189 Other ill-defined heart diseases: Secondary | ICD-10-CM | POA: Insufficient documentation

## 2021-02-13 NOTE — Assessment & Plan Note (Signed)
Lab Results  Component Value Date   TSH 3.07 02/11/2021   Stable, pt to continue levothyroxine

## 2021-02-13 NOTE — Assessment & Plan Note (Signed)
Atypical, etiology unclear, ecg reviewed, to continue current med tx, refer cardiology

## 2021-02-13 NOTE — Assessment & Plan Note (Addendum)
Etiology unclear, exam benign, may have elements of anxiety, advanced age, deconditioning - declines PFTs for now, but for labs including cbc today,  for cxr and f/u pulm and pcp as planned

## 2021-02-13 NOTE — Assessment & Plan Note (Signed)
Lab Results  Component Value Date   CREATININE 1.21 (H) 02/11/2021   Stable overall, cont to avoid nephrotoxins

## 2021-02-13 NOTE — Assessment & Plan Note (Signed)
D/w pt meaning of DD, pt has no evidence for volume retention, cont current med tx

## 2021-02-13 NOTE — Assessment & Plan Note (Signed)
Lab Results  Component Value Date   LDLCALC 92 02/11/2021   Stable, pt to continue current statin zocor

## 2021-02-13 NOTE — Assessment & Plan Note (Signed)
BP Readings from Last 3 Encounters:  02/11/21 (!) 152/80  01/17/21 130/80  10/25/20 138/80   Elevated today but ? Reactive with anxiety, pt to continue medical treatment tenormin as declines change

## 2021-02-15 LAB — D-DIMER, QUANTITATIVE: D-Dimer, Quant: 1.56 mcg/mL FEU — ABNORMAL HIGH (ref <0.50)

## 2021-02-15 LAB — PTH, INTACT AND CALCIUM
Calcium: 9.8 mg/dL (ref 8.6–10.4)
PTH: 54 pg/mL (ref 16–77)

## 2021-02-23 ENCOUNTER — Telehealth: Payer: Self-pay | Admitting: Internal Medicine

## 2021-02-23 NOTE — Telephone Encounter (Signed)
Patient checking status of lab results  Informed patient of provider's 02-12-2021 result notes  Patient states she has not received letter w/ results  Patient understood provider's recommendation, patient does not have any questions at this time

## 2021-03-02 ENCOUNTER — Telehealth: Payer: Self-pay | Admitting: Internal Medicine

## 2021-03-02 NOTE — Telephone Encounter (Signed)
Results printed and placed in folder to be mailed again. Patient notified via voicemail

## 2021-03-02 NOTE — Telephone Encounter (Signed)
Patient calling in  Patient says she never received results that were mailed 12.17.22.. requesting that be remailed to address on file

## 2021-03-17 NOTE — Progress Notes (Signed)
Cardiology Office Note:    Date:  03/18/2021   ID:  April Donaldson, DOB Jun 18, 1931, MRN [id number]  PCP:  April Koch, MD  Cardiologist:  None   Referring MD: April Borg, MD   Chief Complaint  Patient presents with   Shortness of Breath    History of Present Illness:    April Donaldson is a 88 y.o. female with a hx of chest pain and dyspnea.  Background medical history includes anxiety, RCC (kidney cancer), cerebrovascular disease, hepatitis, hiatal hernia, elevated lipids, hypertension, OSA, and others.  [name] has been noticing dyspnea on exertion, slowly progressive over the last 3 to 4 years.  She denies orthopnea.  She states that sedentary lifestyle is becoming increasingly prevalent because of her sciatica that limits her mobility and causes significant pain.  She denies orthopnea, chest pain, peripheral edema, syncope, palpitations, but does occasionally have pulsatile tinnitus at night.  No prior history of heart disease.  She is inquisitive about the presence of "diastolic dysfunction" on her echo report from 2 years ago.  She has never smoked.  She is not diabetic.  She does have primary hypertension.  Her mother died of heart failure.  She does not know if her mother had systolic heart failure or not.  Past Medical History:  Diagnosis Date   Anxiety    Benign neoplasm of kidney, except pelvis    Cellulitis of leg 03/09/2013 hosp    Cerebrovascular disease    Chronic low back pain    Difficult intubation    per patient with hand surgery in 2016 at surgical center- lip was pinched and swollen after surgery- patient states dr Burney Gauze never stated a problem with intubation    Diverticulosis    DJD (degenerative joint disease)    Dyspnea    with exertion    GERD (gastroesophageal reflux disease)    no meds   Glaucoma    Gout    H/O hiatal hernia    Hepatitis    hx of years ago    Hx of colonic polyps    Hypercholesteremia    Hypertension     Hypothyroid    IBS (irritable bowel syndrome)    Internal hemorrhoids    Obesity    OSA (obstructive sleep apnea)    NPSG 2009: AHI 20/h. CPAP - follows with [facility]   Renal insufficiency    one kidney small and non funtioning   Tubular adenoma 2000   Venous insufficiency     Past Surgical History:  Procedure Laterality Date   ABDOMINAL HYSTERECTOMY     APPENDECTOMY     CATARACT EXTRACTION, BILATERAL Bilateral 2017   cataracts Bilateral    COLONOSCOPY     CYSTOSCOPY/RETROGRADE/URETEROSCOPY Bilateral 04/09/2017   Procedure: CYSTOSCOPY/RETROGRADE;  Surgeon: April Bring, MD;  Location: [facility];  Service: Urology;  Laterality: Bilateral;  ONLY NEEDS 30 MIN FOR PROCEDURE   DILATION AND CURETTAGE OF UTERUS     FUNCTIONAL ENDOSCOPIC SINUS SURGERY  05/2003   Dr. Ernesto Rutherford   hysterctomy and ooperectomy  1970's   MASS EXCISION  2003   lt   MASS EXCISION Left 09/17/2013   Procedure: EXCISION MASS LEFT HAND;  Surgeon: Schuyler Amor, MD;  Location: [facility];  Service: Orthopedics;  Laterality: Left;   thyroid lobectomy for a cyst  10/2000   Dr. Ernesto Rutherford   TONSILLECTOMY      Current Medications: Current Meds  Medication Sig   allopurinol (ZYLOPRIM) 100  MG tablet TAKE 1 TABLET BY MOUTH EVERY DAY   aspirin 81 MG tablet Take 81 mg by mouth daily.   atenolol (TENORMIN) 50 MG tablet TAKE 2 TABLETS DAILY   dorzolamide-timolol (COSOPT) 22.3-6.8 MG/ML ophthalmic solution Place 1 drop into both eyes 2 (two) times daily.   latanoprost (XALATAN) 0.005 % ophthalmic solution Place 1 drop into both eyes at bedtime.   levothyroxine (SYNTHROID) 50 MCG tablet TAKE 1 TABLET BY MOUTH DAILY BEFORE BREAKFAST   lisinopril (ZESTRIL) 10 MG tablet Take 10 mg by mouth as directed. Takes 10mg  in the am and 5mg  at bedtime   simvastatin (ZOCOR) 20 MG tablet TAKE 1 TABLET DAILY AT 6 P.M.   triamterene-hydrochlorothiazide (DYAZIDE) 37.5-25 MG capsule TAKE 1 CAPSULE BY MOUTH EVERY DAY      Allergies:   Amlodipine besylate, Clonidine, Diltiazem hcl, Klonopin [clonazepam], Tetracycline, Doxycycline hyclate, Hydralazine, Iodine, Labetalol hcl, Lidocaine, and Olmesartan medoxomil   Social History   Socioeconomic History   Marital status: Single    Spouse name: Not on file   Number of children: 1   Years of education: Not on file   Highest education level: Not on file  Occupational History   Occupation: retired from worked in Engineer, manufacturing systems: RETIRED  Tobacco Use   Smoking status: Never   Smokeless tobacco: Never  Vaping Use   Vaping Use: Never used  Substance and Sexual Activity   Alcohol use: No    Alcohol/week: 0.0 standard drinks   Drug use: No   Sexual activity: Not Currently  Other Topics Concern   Not on file  Social History Narrative   Lives alone, indep - never married   g-dtr in [place], siblings, nieces in [place] in [employer] with a few friends who talk regularly   Social Determinants of Health   Financial Resource Strain: Low Risk    Difficulty of Paying Living Expenses: Not hard at all  Food Insecurity: No Food Insecurity   Worried About Charity fundraiser in the Last Year: Never true   Arboriculturist in the Last Year: Never true  Transportation Needs: No Transportation Needs   Lack of Transportation (Medical): No   Lack of Transportation (Non-Medical): No  Physical Activity: Sufficiently Active   Days of Exercise per Week: 5 days   Minutes of Exercise per Session: 30 min  Stress: No Stress Concern Present   Feeling of Stress : Not at all  Social Connections: Moderately Isolated   Frequency of Communication with Friends and Family: More than three times a week   Frequency of Social Gatherings with Friends and Family: More than three times a week   Attends Religious Services: More than 4 times per year   Active Member of [employer] or Organizations: No   Attends Archivist Meetings: Never   Marital Status: Widowed      Family History: The patient's family history includes Asthma in her brother and sister; Brain cancer (age of onset: 17) in her sister; Breast cancer (age of onset: 13) in her sister; Congestive Heart Failure in her mother; Diabetes in her mother; Heart disease in her mother; Throat cancer (age of onset: 39) in her brother. There is no history of Colon cancer, Esophageal cancer, Stomach cancer, or Pancreatic cancer.  ROS:   Please see the history of present illness.    She does not want to be on more medications.  She has concerns about side effects.  She  feels that the meds she is on now have caused hair loss.  All other systems reviewed and are negative.  EKGs/Labs/Other Studies Reviewed:    The following studies were reviewed today:  2 D ECHOCARDIOGRAM 02/12/2020 IMPRESSIONS     1. Left ventricular ejection fraction, by estimation, is 55 to 60%. The  left ventricle has normal function. The left ventricle has no regional  wall motion abnormalities. Left ventricular diastolic parameters are  consistent with Grade II diastolic  dysfunction (pseudonormalization).   2. Right ventricular systolic function is normal. The right ventricular  size is normal. There is normal pulmonary artery systolic pressure.   3. The mitral valve is grossly normal. Trivial mitral valve  regurgitation.   4. The aortic valve is tricuspid. Aortic valve regurgitation is not  visualized.   5. The inferior vena cava is normal in size with greater than 50%  respiratory variability, suggesting right atrial pressure of 3 mmHg.   Comparison(s): A prior study was performed on 06/05/2006. No significant  change from prior study. Prior images reviewed side by side.   EKG:  EKG sinus bradycardia with left anterior hemiblock and poor R wave progression V1 through V6.  Recent Labs: 02/11/2021: ALT 19; BUN 25; Creatinine, Ser 1.21; Hemoglobin 14.1; Platelets 262.0; Potassium 3.2; Pro B Natriuretic peptide (BNP) 41.0;  Sodium 140; TSH 3.07  Recent Lipid Panel    Component Value Date/Time   CHOL 185 02/11/2021 0950   TRIG 140.0 02/11/2021 0950   HDL 65.30 02/11/2021 0950   CHOLHDL 3 02/11/2021 0950   VLDL 28.0 02/11/2021 0950   LDLCALC 92 02/11/2021 0950    Physical Exam:    VS:  BP 138/82    Pulse (!) 51    Ht 4\' 11"  (1.499 m)    Wt 181 lb (82.1 kg)    SpO2 98%    BMI 36.56 kg/m     Wt Readings from Last 3 Encounters:  03/18/21 181 lb (82.1 kg)  02/11/21 180 lb 3.2 oz (81.7 kg)  01/17/21 177 lb (80.3 kg)     GEN: Slightly overweight.  Appears younger than stated age.. No acute distress HEENT: Normal NECK: No JVD. LYMPHATICS: No lymphadenopathy CARDIAC: No murmur. RRR no gallop, or edema. VASCULAR:  Normal Pulses. No bruits. RESPIRATORY:  Clear to auscultation without rales, wheezing or rhonchi  ABDOMEN: Soft, non-tender, non-distended, No pulsatile mass, MUSCULOSKELETAL: No deformity  SKIN: Warm and dry NEUROLOGIC:  Alert and oriented x 3 PSYCHIATRIC:  Normal affect   ASSESSMENT:    1. Chest pain of uncertain etiology   2. Shortness of breath   3. Chronic diastolic heart failure ([place])   4. Slow heart rate   5. Stage 3b chronic kidney disease ([place])   6. Obstructive sleep apnea   7. Essential hypertension   8. Hyperlipidemia, unspecified hyperlipidemia type    PLAN:    In order of problems listed above:  Not having chest pain. Shortness of breath is from dyspnea on exertion.  She has no shortness of breath at rest.  This likely represents manifestation of diastolic heart failure.  There are probably also component is related to deconditioning, obesity, chronotropic incompetence (beta-blocker induced), and sleep apnea.  I recommended a trial of SGLT2 therapy with either [place] or Jardiance.  She was not in favor of adding a new medication. See #2 above.  Recommended starting SGLT2 therapy but she is not in favor of adding new medication. Bradycardia related to aging and  moderately high-dose beta-blocker  therapy.  The dose should be decreased.  I discussed this with the patient and explained we would likely have to add another medication if we reduce the dose of Tenormin.  This caused her to bulk at the idea of making adjustments in the Tenormin dose.  My recommendation would have been to decrease Tenormin to 50 mg/day and add low-dose amlodipine to compensate for any blood pressure elevation. Did not address.  Last creatinine 1.2 Compliant with CPAP although she did not wear it last night because she felt she would oversleep. Adequate blood pressure control for age.  If we had an SGLT2, may be able to drop beta-blocker intensity without adding a new medication since there is some diuretic effect. Continue statin therapy.   Had a long discussion.  We made no changes.  Patient wants to ponder if she should change her medications at all.  She will notify us if we can answer questions or if she intends to follow our instructions concerning medication adjustments.  She needs time to think this over and a shared decision making process.   Medication Adjustments/Labs and Tests Ordered: Current medicines are reviewed at length with the patient today.  Concerns regarding medicines are outlined above.  Orders Placed This Encounter  Procedures   EKG 12-Lead   No orders of the defined types were placed in this encounter.   Patient Instructions  Medication Instructions:  Your physician recommends that you continue on your current medications as directed. Please refer to the Current Medication list given to you today.  *If you need a refill on your cardiac medications before your next appointment, please call your pharmacy*   Lab Work: None If you have labs (blood work) drawn today and your tests are completely normal, you will receive your results only by: [place] (if you have MyChart) OR A paper copy in the mail If you have any lab test that is abnormal  or we need to change your treatment, we will call you to review the results.   Testing/Procedures: None   Follow-Up: At [facility], you and your health needs are our priority.  As part of our continuing mission to provide you with exceptional heart care, we have created designated Provider Care Teams.  These Care Teams include your primary Cardiologist (physician) and Advanced Practice Providers (APPs -  Physician Assistants and Nurse Practitioners) who all work together to provide you with the care you need, when you need it.  We recommend signing up for the patient portal called "MyChart".  Sign up information is provided on this After Visit Summary.  MyChart is used to connect with patients for Virtual Visits (Telemedicine).  Patients are able to view lab/test results, encounter notes, upcoming appointments, etc.  Non-urgent messages can be sent to your provider as well.   To learn more about what you can do with MyChart, go to [email or web address].    Your next appointment:   As needed  The format for your next appointment:   In Person  Provider:   [facility]. Blenda Bridegroom, MD    Other Instructions     Signed, Sinclair Grooms, MD  03/18/2021 11:28 AM    [place]

## 2021-03-18 ENCOUNTER — Ambulatory Visit (INDEPENDENT_AMBULATORY_CARE_PROVIDER_SITE_OTHER): Payer: Medicare Other | Admitting: Interventional Cardiology

## 2021-03-18 ENCOUNTER — Encounter: Payer: Self-pay | Admitting: Interventional Cardiology

## 2021-03-18 ENCOUNTER — Other Ambulatory Visit: Payer: Self-pay

## 2021-03-18 VITALS — BP 138/82 | HR 51 | Ht 59.0 in | Wt 181.0 lb

## 2021-03-18 DIAGNOSIS — R001 Bradycardia, unspecified: Secondary | ICD-10-CM

## 2021-03-18 DIAGNOSIS — R0602 Shortness of breath: Secondary | ICD-10-CM

## 2021-03-18 DIAGNOSIS — I10 Essential (primary) hypertension: Secondary | ICD-10-CM

## 2021-03-18 DIAGNOSIS — R079 Chest pain, unspecified: Secondary | ICD-10-CM | POA: Diagnosis not present

## 2021-03-18 DIAGNOSIS — N1832 Chronic kidney disease, stage 3b: Secondary | ICD-10-CM

## 2021-03-18 DIAGNOSIS — G4733 Obstructive sleep apnea (adult) (pediatric): Secondary | ICD-10-CM

## 2021-03-18 DIAGNOSIS — E785 Hyperlipidemia, unspecified: Secondary | ICD-10-CM

## 2021-03-18 DIAGNOSIS — I5032 Chronic diastolic (congestive) heart failure: Secondary | ICD-10-CM

## 2021-03-18 NOTE — Patient Instructions (Signed)
Medication Instructions:  Your physician recommends that you continue on your current medications as directed. Please refer to the Current Medication list given to you today.  *If you need a refill on your cardiac medications before your next appointment, please call your pharmacy*   Lab Work: None If you have labs (blood work) drawn today and your tests are completely normal, you will receive your results only by: [place] (if you have MyChart) OR A paper copy in the mail If you have any lab test that is abnormal or we need to change your treatment, we will call you to review the results.   Testing/Procedures: None   Follow-Up: At [facility], you and your health needs are our priority.  As part of our continuing mission to provide you with exceptional heart care, we have created designated Provider Care Teams.  These Care Teams include your primary Cardiologist (physician) and Advanced Practice Providers (APPs -  Physician Assistants and Nurse Practitioners) who all work together to provide you with the care you need, when you need it.  We recommend signing up for the patient portal called "MyChart".  Sign up information is provided on this After Visit Summary.  MyChart is used to connect with patients for Virtual Visits (Telemedicine).  Patients are able to view lab/test results, encounter notes, upcoming appointments, etc.  Non-urgent messages can be sent to your provider as well.   To learn more about what you can do with MyChart, go to [email or web address].    Your next appointment:   As needed  The format for your next appointment:   In Person  Provider:   [facility]. Blenda Bridegroom, MD    Other Instructions

## 2021-03-25 ENCOUNTER — Other Ambulatory Visit: Payer: Self-pay

## 2021-03-25 ENCOUNTER — Ambulatory Visit (INDEPENDENT_AMBULATORY_CARE_PROVIDER_SITE_OTHER): Payer: Medicare Other | Admitting: Internal Medicine

## 2021-03-25 ENCOUNTER — Encounter: Payer: Self-pay | Admitting: Internal Medicine

## 2021-03-25 VITALS — BP 126/80 | HR 60 | Resp 18 | Ht 59.0 in | Wt 180.0 lb

## 2021-03-25 DIAGNOSIS — R0600 Dyspnea, unspecified: Secondary | ICD-10-CM

## 2021-03-25 DIAGNOSIS — I6523 Occlusion and stenosis of bilateral carotid arteries: Secondary | ICD-10-CM | POA: Diagnosis not present

## 2021-03-25 DIAGNOSIS — N1832 Chronic kidney disease, stage 3b: Secondary | ICD-10-CM

## 2021-03-25 NOTE — Assessment & Plan Note (Signed)
Wishes referral to weight loss clinic which is done. We talked about diet and exercise. BMI 36 but complicated by hypertension and OSA.

## 2021-03-25 NOTE — Assessment & Plan Note (Signed)
Overall stable, asked her to work on exercise program to see if this helps. Seen recently with reasonable workup as well as at cardiology. She does have diastolic dysfunction. She did not want to make medication changes recommended by cardiology. We discussed reduction of dose of atenolol and she does not want to do that as her BP is labile and she does have only 1 kidney and CKD.

## 2021-03-25 NOTE — Progress Notes (Signed)
° °  Subjective:   Patient ID: April Donaldson, female    DOB: 07/18/1931, 88 y.o.   MRN: [id number]  HPI The patient is an 88 YO female coming in for follow up.   Review of Systems  Constitutional:  Positive for activity change and unexpected weight change.  HENT:  Positive for tinnitus.   Eyes: Negative.   Respiratory:  Positive for shortness of breath. Negative for cough and chest tightness.   Cardiovascular:  Negative for chest pain, palpitations and leg swelling.  Gastrointestinal:  Negative for abdominal distention, abdominal pain, constipation, diarrhea, nausea and vomiting.  Musculoskeletal: Negative.   Skin: Negative.   Neurological: Negative.   Psychiatric/Behavioral: Negative.     Objective:  Physical Exam Constitutional:      Appearance: She is well-developed. She is obese.  HENT:     Head: Normocephalic and atraumatic.  Cardiovascular:     Rate and Rhythm: Normal rate and regular rhythm.  Pulmonary:     Effort: Pulmonary effort is normal. No respiratory distress.     Breath sounds: Normal breath sounds. No wheezing or rales.  Abdominal:     General: Bowel sounds are normal. There is no distension.     Palpations: Abdomen is soft.     Tenderness: There is no abdominal tenderness. There is no rebound.  Musculoskeletal:     Cervical back: Normal range of motion.  Skin:    General: Skin is warm and dry.  Neurological:     Mental Status: She is alert and oriented to person, place, and time.     Coordination: Coordination normal.    Vitals:   03/25/21 1042  BP: 126/80  Pulse: 60  Resp: 18  SpO2: 98%  Weight: 180 lb (81.6 kg)  Height: 4\' 11"  (1.499 m)    This visit occurred during the SARS-CoV-2 public health emergency.  Safety protocols were in place, including screening questions prior to the visit, additional usage of staff PPE, and extensive cleaning of exam room while observing appropriate contact time as indicated for disinfecting solutions.    Assessment & Plan:

## 2021-03-25 NOTE — Assessment & Plan Note (Signed)
Mild noted on [place] 2013 and new pulsatile tinnitus. Ordered US carotid to rule out progression. No bruit detected on exam.

## 2021-03-25 NOTE — Patient Instructions (Addendum)
Check on the shingles vaccine.  We will check the carotid arteries with the ultrasound.

## 2021-03-25 NOTE — Assessment & Plan Note (Signed)
Labs reviewed and stable. She saw her nephrologist recently and they are against her changing her BP regimen.

## 2021-04-01 ENCOUNTER — Other Ambulatory Visit: Payer: Self-pay | Admitting: Internal Medicine

## 2021-04-04 ENCOUNTER — Other Ambulatory Visit: Payer: Self-pay

## 2021-04-04 ENCOUNTER — Ambulatory Visit (HOSPITAL_COMMUNITY)
Admission: RE | Admit: 2021-04-04 | Discharge: 2021-04-04 | Disposition: A | Discharge status: Home / Self Care | Payer: Medicare Other | Source: Ambulatory Visit | Attending: Cardiology | Admitting: Cardiology

## 2021-04-04 DIAGNOSIS — I6523 Occlusion and stenosis of bilateral carotid arteries: Secondary | ICD-10-CM | POA: Insufficient documentation

## 2021-05-23 ENCOUNTER — Other Ambulatory Visit: Payer: Self-pay | Admitting: Internal Medicine

## 2021-05-25 ENCOUNTER — Ambulatory Visit: Payer: Medicare Other | Admitting: Gastroenterology

## 2021-05-27 ENCOUNTER — Ambulatory Visit (INDEPENDENT_AMBULATORY_CARE_PROVIDER_SITE_OTHER): Payer: Medicare Other | Admitting: Internal Medicine

## 2021-05-27 ENCOUNTER — Encounter: Payer: Self-pay | Admitting: Internal Medicine

## 2021-05-27 VITALS — BP 130/76 | HR 57 | Resp 18 | Ht 59.0 in | Wt 181.6 lb

## 2021-05-27 DIAGNOSIS — N1832 Chronic kidney disease, stage 3b: Secondary | ICD-10-CM

## 2021-05-27 DIAGNOSIS — I10 Essential (primary) hypertension: Secondary | ICD-10-CM | POA: Diagnosis not present

## 2021-05-27 DIAGNOSIS — M1A00X0 Idiopathic chronic gout, unspecified site, without tophus (tophi): Secondary | ICD-10-CM

## 2021-05-27 DIAGNOSIS — E876 Hypokalemia: Secondary | ICD-10-CM | POA: Diagnosis not present

## 2021-05-27 LAB — COMPREHENSIVE METABOLIC PANEL
ALT: 15 U/L (ref 0–35)
AST: 21 U/L (ref 0–37)
Albumin: 4.2 g/dL (ref 3.5–5.2)
Alkaline Phosphatase: 60 U/L (ref 39–117)
BUN: 22 mg/dL (ref 6–23)
CO2: 28 mEq/L (ref 19–32)
Calcium: 9.3 mg/dL (ref 8.4–10.5)
Chloride: 105 mEq/L (ref 96–112)
Creatinine, Ser: 1.28 mg/dL — ABNORMAL HIGH (ref 0.40–1.20)
GFR: 37.71 mL/min — ABNORMAL LOW (ref >60.00)
Glucose, Bld: 79 mg/dL (ref 70–99)
Potassium: 3.9 mEq/L (ref 3.5–5.1)
Sodium: 141 mEq/L (ref 135–145)
Total Bilirubin: 0.4 mg/dL (ref 0.2–1.2)
Total Protein: 7.5 g/dL (ref 6.0–8.3)

## 2021-05-27 LAB — MAGNESIUM: Magnesium: 1.8 mg/dL (ref 1.5–2.5)

## 2021-05-27 LAB — URIC ACID: Uric Acid, Serum: 7.5 mg/dL — ABNORMAL HIGH (ref 2.4–7.0)

## 2021-05-27 NOTE — Assessment & Plan Note (Signed)
Recheck CMP and magnesium. Treat as appropriate. Given BP has been difficult to control it is not a good option to stop triamterene/hctz. We may need to add potassium pills daily if persistently low. ?

## 2021-05-27 NOTE — Progress Notes (Signed)
? ?  Subjective:  ? ?Patient ID: April Donaldson, female    DOB: 12-29-1931, 88 y.o.   MRN: [id number] ? ?HPI ?The patient is an 88 YO female coming in for follow up.  ? ?Review of Systems  ?Constitutional: Negative.   ?HENT: Negative.    ?Eyes: Negative.   ?Respiratory:  Negative for cough, chest tightness and shortness of breath.   ?Cardiovascular:  Negative for chest pain, palpitations and leg swelling.  ?Gastrointestinal:  Negative for abdominal distention, abdominal pain, constipation, diarrhea, nausea and vomiting.  ?Musculoskeletal:  Positive for arthralgias and myalgias.  ?Skin: Negative.   ?Neurological: Negative.   ?Psychiatric/Behavioral: Negative.    ? ?Objective:  ?Physical Exam ?Constitutional:   ?   Appearance: She is well-developed. She is obese.  ?HENT:  ?   Head: Normocephalic and atraumatic.  ?Cardiovascular:  ?   Rate and Rhythm: Normal rate and regular rhythm.  ?Pulmonary:  ?   Effort: Pulmonary effort is normal. No respiratory distress.  ?   Breath sounds: Normal breath sounds. No wheezing or rales.  ?Abdominal:  ?   General: Bowel sounds are normal. There is no distension.  ?   Palpations: Abdomen is soft.  ?   Tenderness: There is no abdominal tenderness. There is no rebound.  ?Musculoskeletal:     ?   General: Tenderness present.  ?   Cervical back: Normal range of motion.  ?Skin: ?   General: Skin is warm and dry.  ?Neurological:  ?   Mental Status: She is alert and oriented to person, place, and time.  ?   Coordination: Coordination normal.  ? ? ?Vitals:  ? 05/27/21 1101  ?BP: 130/76  ?Pulse: (!) 57  ?Resp: 18  ?SpO2: 96%  ?Weight: 181 lb 9.6 oz (82.4 kg)  ?Height: '4\' 11"'$  (1.499 m)  ? ? ?This visit occurred during the SARS-CoV-2 public health emergency.  Safety protocols were in place, including screening questions prior to the visit, additional usage of staff PPE, and extensive cleaning of exam room while observing appropriate contact time as indicated for disinfecting solutions.   ? ?Assessment & Plan:  ? ?

## 2021-05-27 NOTE — Assessment & Plan Note (Signed)
Checking uric acid and adjust dosing of allopurinol 100 mg daily. No recent gout flares. Goal uric acid <6. ?

## 2021-05-27 NOTE — Patient Instructions (Signed)
We will check the labs today. 

## 2021-05-27 NOTE — Assessment & Plan Note (Signed)
BP at goal today on atenolol 100 mg daily and lisinopril 10 mg qam and 5 mg qpm and triamterene/hctz 37.5/25 mg daily. Checking CMP and magnesium due to prior low K for follow up.  ?

## 2021-05-27 NOTE — Assessment & Plan Note (Signed)
Checking CMP and adjust as needed. BP at goal today but is labile at times.  ?

## 2021-06-01 ENCOUNTER — Telehealth: Payer: Self-pay | Admitting: Internal Medicine

## 2021-06-01 NOTE — Telephone Encounter (Signed)
Pt stated that she was having abdominal pain along with a change in Bowel Movements: ?Pt stated that she was requesting to be seen today: Pt was notified that our soonest appointment was a week out: ?Pt stated that she was not going to wait that long: Pt was notified to go to the Urgent Care if she wanted to be seen today by a provider: ?Pt verbalized understanding with all questions answered.  ? ?

## 2021-06-01 NOTE — Telephone Encounter (Signed)
Patient called states she is feeling really sick with chronic abdominal pain seeking to speak with a nurse. ?

## 2021-06-03 ENCOUNTER — Encounter (HOSPITAL_COMMUNITY): Payer: Self-pay

## 2021-06-03 ENCOUNTER — Ambulatory Visit (INDEPENDENT_AMBULATORY_CARE_PROVIDER_SITE_OTHER): Payer: Medicare Other

## 2021-06-03 ENCOUNTER — Ambulatory Visit (HOSPITAL_COMMUNITY)
Admission: EM | Admit: 2021-06-03 | Discharge: 2021-06-03 | Disposition: A | Discharge status: Home / Self Care | Payer: Medicare Other | Attending: Physician Assistant | Admitting: Physician Assistant

## 2021-06-03 DIAGNOSIS — Z9049 Acquired absence of other specified parts of digestive tract: Secondary | ICD-10-CM | POA: Insufficient documentation

## 2021-06-03 DIAGNOSIS — Z8249 Family history of ischemic heart disease and other diseases of the circulatory system: Secondary | ICD-10-CM | POA: Diagnosis not present

## 2021-06-03 DIAGNOSIS — I10 Essential (primary) hypertension: Secondary | ICD-10-CM

## 2021-06-03 DIAGNOSIS — Z79899 Other long term (current) drug therapy: Secondary | ICD-10-CM | POA: Diagnosis not present

## 2021-06-03 DIAGNOSIS — R194 Change in bowel habit: Secondary | ICD-10-CM

## 2021-06-03 LAB — POCT URINALYSIS DIPSTICK, ED / UC
Bilirubin Urine: NEGATIVE
Glucose, UA: NEGATIVE mg/dL
Ketones, ur: NEGATIVE mg/dL
Leukocytes,Ua: NEGATIVE
Nitrite: NEGATIVE
Protein, ur: 100 mg/dL — AB
Specific Gravity, Urine: 1.02 (ref 1.005–1.030)
Urobilinogen, UA: 0.2 mg/dL (ref 0.0–1.0)
pH: 6 (ref 5.0–8.0)

## 2021-06-03 NOTE — ED Provider Notes (Signed)
?[place] ? ? ? ?CSN: [id number] ?Arrival date & time: 06/03/21  1306 ? ? ?  ? ?History   ?Chief Complaint ?Chief Complaint  ?Patient presents with  ? Abdominal Pain  ? ? ?HPI ?April Donaldson is a 88 y.o. female.  ? ?Patient presents today with a 1 week history of change in bowel habits.  She reports having small looser bowel movements does not feel she is completely emptying her bowels.  She denies any significant abdominal pain, fever, nausea, vomiting.  She does have a history of diverticulitis but is unsure if that is contributing to symptoms.  Denies any recent dietary changes or medication changes.  Denies suspicious food intake, recent antibiotics, recent travel, known sick contacts.  She is status post appendectomy and abdominal hysterectomy but still has gallbladder.  She reports last passing stool earlier today but this was a small amount.  She is passing gas normally.  She has had a decreased appetite and has been eating less frequently as a result of symptoms. ? ?In addition, patient was noted to have elevated blood pressure on intake.  She does have a history of hypertension and has been taking antihypertensive medications as prescribed.  She denies any increased sodium consumption, caffeine use, use of decongestants, significant NSAID use.  She denies any chest pain, shortness of breath, headache, vision change, dizziness. ? ? ?Past Medical History:  ?Diagnosis Date  ? Anxiety   ? Benign neoplasm of kidney, except pelvis   ? Cellulitis of leg 03/09/2013 hosp   ? Cerebrovascular disease   ? Chronic low back pain   ? Difficult intubation   ? per patient with hand surgery in 2016 at surgical center- lip was pinched and swollen after surgery- patient states dr Burney Gauze never stated a problem with intubation   ? Diverticulosis   ? DJD (degenerative joint disease)   ? Dyspnea   ? with exertion   ? GERD (gastroesophageal reflux disease)   ? no meds  ? Glaucoma   ? Gout   ? H/O hiatal hernia   ?  Hepatitis   ? hx of years ago   ? Hx of colonic polyps   ? Hypercholesteremia   ? Hypertension   ? Hypothyroid   ? IBS (irritable bowel syndrome)   ? Internal hemorrhoids   ? Obesity   ? OSA (obstructive sleep apnea)   ? NPSG 2009: AHI 20/h. CPAP - follows with [facility]  ? Renal insufficiency   ? one kidney small and non funtioning  ? Tubular adenoma 2000  ? Venous insufficiency   ? ? ?Patient Active Problem List  ? Diagnosis Date Noted  ? Hypokalemia 05/27/2021  ? Bilateral carotid artery stenosis 03/25/2021  ? Grade II diastolic dysfunction 23/55/7322  ? Chest pain 02/11/2021  ? Dyspnea 02/11/2021  ? Systolic murmur 02/54/2706  ? Numbness and tingling in left hand 06/26/2019  ? Left shoulder pain 11/28/2018  ? Fatigue 02/08/2018  ? Right shoulder pain 03/21/2017  ? Bilateral pseudophakia 04/10/2016  ? Routine general medical examination at a health care facility 06/15/2014  ? Primary open angle glaucoma of both eyes, mild stage 07/10/2011  ? Obstructive sleep apnea 11/11/2007  ? GLAUCOMA 09/22/2007  ? CKD (chronic kidney disease) stage 3, GFR 30-59 ml/min (HCC) 03/21/2007  ? Hypothyroidism 01/04/2007  ? Hyperlipidemia 01/04/2007  ? GOUT 01/04/2007  ? Morbid obesity ([place]) 01/04/2007  ? Essential hypertension 01/04/2007  ? ? ?Past Surgical History:  ?Procedure Laterality Date  ?  ABDOMINAL HYSTERECTOMY    ? APPENDECTOMY    ? CATARACT EXTRACTION, BILATERAL Bilateral 2017  ? cataracts Bilateral   ? COLONOSCOPY    ? CYSTOSCOPY/RETROGRADE/URETEROSCOPY Bilateral 04/09/2017  ? Procedure: QJJHERDEYC/XKGYJEHUDJ;  Surgeon: Raynelle Bring, MD;  Location: [facility];  Service: Urology;  Laterality: Bilateral;  ONLY NEEDS 30 MIN FOR PROCEDURE  ? DILATION AND CURETTAGE OF UTERUS    ? FUNCTIONAL ENDOSCOPIC SINUS SURGERY  05/2003  ? Dr. Ernesto Rutherford  ? hysterctomy and ooperectomy  [phone]  ? MASS EXCISION  2003  ? lt  ? MASS EXCISION Left 09/17/2013  ? Procedure: EXCISION MASS LEFT HAND;  Surgeon: Schuyler Amor, MD;  Location: [facility];  Service: Orthopedics;  Laterality: Left;  ? thyroid lobectomy for a cyst  10/2000  ? Dr. Ernesto Rutherford  ? TONSILLECTOMY    ? ? ?OB History   ?No obstetric history on file. ?  ? ? ? ?Home Medications   ? ?Prior to Admission medications   ?Medication Sig Start Date End Date Taking? Authorizing Provider  ?allopurinol (ZYLOPRIM) 100 MG tablet TAKE 1 TABLET BY MOUTH EVERY DAY 05/23/21   Hoyt Koch, MD  ?aspirin 81 MG tablet Take 81 mg by mouth daily.    [provider]  ?atenolol (TENORMIN) 50 MG tablet TAKE 2 TABLETS DAILY 01/26/21   Hoyt Koch, MD  ?dorzolamide-timolol (COSOPT) 22.3-6.8 MG/ML ophthalmic solution Place 1 drop into both eyes 2 (two) times daily.    [provider]  ?latanoprost (XALATAN) 0.005 % ophthalmic solution Place 1 drop into both eyes at bedtime.    [provider]  ?levothyroxine (SYNTHROID) 50 MCG tablet TAKE 1 TABLET BY MOUTH DAILY BEFORE BREAKFAST 11/10/20   Hoyt Koch, MD  ?lisinopril (ZESTRIL) 10 MG tablet Take 10 mg by mouth as directed. Takes '10mg'$  in the am and '5mg'$  at bedtime 02/14/19   [provider]  ?simvastatin (ZOCOR) 20 MG tablet TAKE 1 TABLET DAILY AT 6PM 04/04/21   Hoyt Koch, MD  ?triamterene-hydrochlorothiazide (DYAZIDE) 37.5-25 MG capsule TAKE 1 CAPSULE BY MOUTH EVERY DAY 02/11/21   Hoyt Koch, MD  ? ? ?Family History ?Family History  ?Problem Relation Age of Onset  ? Heart disease Mother   ? Diabetes Mother   ? Congestive Heart Failure Mother   ? Asthma Brother   ? Throat cancer Brother 73  ? Asthma Sister   ? Brain cancer Sister 53  ? Breast cancer Sister 23  ? Colon cancer Neg Hx   ? Esophageal cancer Neg Hx   ? Stomach cancer Neg Hx   ? Pancreatic cancer Neg Hx   ? ? ?Social History ?Social History  ? ?Tobacco Use  ? Smoking status: Never  ? Smokeless tobacco: Never  ?Vaping Use  ? Vaping Use: Never used  ?Substance Use Topics  ? Alcohol use: No  ?  Alcohol/week: 0.0 standard  drinks  ? Drug use: No  ? ? ? ?Allergies   ?Amlodipine besylate, Clonidine, Diltiazem hcl, Klonopin [clonazepam], Tetracycline, Doxycycline hyclate, Hydralazine, Iodine, Labetalol hcl, Lidocaine, and Olmesartan medoxomil ? ? ?Review of Systems ?Review of Systems  ?Constitutional:  Positive for activity change and appetite change. Negative for fatigue and fever.  ?Respiratory:  Negative for cough and shortness of breath.   ?Cardiovascular:  Negative for chest pain.  ?Gastrointestinal:  Positive for diarrhea. Negative for abdominal pain, blood in stool, constipation, nausea and vomiting.  ?Genitourinary:  Positive for frequency and urgency. Negative for dysuria.  ?  Neurological:  Negative for dizziness, light-headedness and headaches.  ? ? ?Physical Exam ?Triage Vital Signs ?ED Triage Vitals  ?Enc Vitals Group  ?   BP 06/03/21 1356 (!) 170/81  ?   Pulse Rate 06/03/21 1356 88  ?   Resp 06/03/21 1356 16  ?   Temp 06/03/21 1356 (!) 97.5 ?F (36.4 ?C)  ?   Temp Source 06/03/21 1356 Oral  ?   SpO2 06/03/21 1356 100 %  ?   Weight --   ?   Height --   ?   Head Circumference --   ?   Peak Flow --   ?   Pain Score 06/03/21 1357 0  ?   Pain Loc --   ?   Pain Edu? --   ?   Excl. in [place]? --   ? ?No data found. ? ?Updated Vital Signs ?BP (!) 168/77 (BP Location: Right Arm)   Pulse (!) 50   Temp (!) 97.5 ?F (36.4 ?C) (Oral)   Resp 18   SpO2 94%  ? ?Visual Acuity ?Right Eye Distance:   ?Left Eye Distance:   ?Bilateral Distance:   ? ?Right Eye Near:   ?Left Eye Near:    ?Bilateral Near:    ? ?Physical Exam ?Vitals reviewed.  ?Constitutional:   ?   General: She is awake. She is not in acute distress. ?   Appearance: Normal appearance. She is well-developed. She is not ill-appearing.  ?   Comments: Very pleasant female appears stated age in no acute distress sitting comfortably in exam room  ?HENT:  ?   Head: Normocephalic and atraumatic.  ?Cardiovascular:  ?   Rate and Rhythm: Normal rate and regular rhythm.  ?   Heart sounds: Normal  heart sounds, S1 normal and S2 normal. No murmur heard. ?Pulmonary:  ?   Effort: Pulmonary effort is normal.  ?   Breath sounds: Normal breath sounds. No wheezing, rhonchi or rales.  ?   Comments: Clear to [employer]

## 2021-06-03 NOTE — Discharge Instructions (Signed)
There was no evidence of a blockage on your x-ray which is great news.  I would recommend that you start a fiber supplement such as Benefiber.  Make sure you are drinking plenty of fluid.  Eat small frequent meals.  Avoid spicy/acidic/fatty foods.  If your symptoms are not improving by next week please follow-up with your PCP or GI specialist.  If at any point you have severe abdominal pain, blood in your stool, nausea, vomiting, you stop having stools, you stop passing gas you need to go to the emergency room as we discussed. ? ?Your blood pressure is elevated.  Please monitor this at home and follow-up with your PCP or nephrologist.  Monitor your diet for salt and try to avoid caffeine.  Avoid NSAIDs including aspirin, ibuprofen/Advil, naproxen/Aleve and decongestants.  If you develop any chest pain, shortness of breath, headache, vision change in the setting of high blood pressure you need to go to the emergency room immediately. ?

## 2021-06-03 NOTE — ED Triage Notes (Signed)
Pt reports loose stools and abdominal pain for several days. She saw her PCP on Monday and they suggested urine prolapse. Pt would like to discuss her concerns.  ?

## 2021-06-04 ENCOUNTER — Other Ambulatory Visit: Payer: Self-pay

## 2021-06-04 LAB — URINE CULTURE: Culture: NO GROWTH

## 2021-06-05 ENCOUNTER — Emergency Department (HOSPITAL_COMMUNITY)
Admission: EM | Admit: 2021-06-05 | Discharge: 2021-06-06 | Disposition: A | Discharge status: Home / Self Care | Payer: Medicare Other | Attending: Emergency Medicine | Admitting: Emergency Medicine

## 2021-06-05 ENCOUNTER — Encounter (HOSPITAL_COMMUNITY): Payer: Self-pay | Admitting: Emergency Medicine

## 2021-06-05 ENCOUNTER — Other Ambulatory Visit: Payer: Self-pay

## 2021-06-05 DIAGNOSIS — I158 Other secondary hypertension: Secondary | ICD-10-CM

## 2021-06-05 DIAGNOSIS — Z7982 Long term (current) use of aspirin: Secondary | ICD-10-CM | POA: Insufficient documentation

## 2021-06-05 DIAGNOSIS — E039 Hypothyroidism, unspecified: Secondary | ICD-10-CM | POA: Diagnosis not present

## 2021-06-05 DIAGNOSIS — Z79899 Other long term (current) drug therapy: Secondary | ICD-10-CM | POA: Diagnosis not present

## 2021-06-05 DIAGNOSIS — I10 Essential (primary) hypertension: Secondary | ICD-10-CM | POA: Insufficient documentation

## 2021-06-05 NOTE — ED Triage Notes (Signed)
Pt c/o hypertension. BP was elevated at PCP office, pt was told to recheck and reach out to nephro ?

## 2021-06-06 MED ORDER — ACETAMINOPHEN 500 MG PO TABS
1000.0000 mg | ORAL_TABLET | Freq: Once | ORAL | Status: AC
Start: 2021-06-06 — End: 2021-06-06
  Administered 2021-06-06: 1000 mg via ORAL
  Filled 2021-06-06: qty 2

## 2021-06-06 NOTE — ED Provider Notes (Signed)
?[facility] ?Provider Note ? ?CSN: [id number] ?Arrival date & time: 06/05/21 2332 ? ?Chief Complaint(s) ?Hypertension ? ?HPI ?April Donaldson is a 88 y.o. female  ?  ? ?Patient reported that she has been having some abdominal discomfort that improved with over-the-counter fiber supplements.  She was seen at urgent care last week and noted to have elevated blood pressures with systolics in the 505L and 170s.  She was instructed to check them and follow-up with PCP.  Tonight her blood pressures were up in the 200s on several BP checks prompting her visit.  She states that she has not missed any of her medications.  Denies any over-the-counter decongestants or caffeine use. ? ?The history is provided by the patient.  ?Hypertension ?This is a chronic problem. Associated symptoms include headaches (mild right sided). Pertinent negatives include no chest pain, no abdominal pain and no shortness of breath. Nothing aggravates the symptoms. Nothing relieves the symptoms.  ? ? ?Past Medical History ?Past Medical History:  ?Diagnosis Date  ? Anxiety   ? Benign neoplasm of kidney, except pelvis   ? Cellulitis of leg 03/09/2013 hosp   ? Cerebrovascular disease   ? Chronic low back pain   ? Difficult intubation   ? per patient with hand surgery in 2016 at surgical center- lip was pinched and swollen after surgery- patient states dr Burney Gauze never stated a problem with intubation   ? Diverticulosis   ? DJD (degenerative joint disease)   ? Dyspnea   ? with exertion   ? GERD (gastroesophageal reflux disease)   ? no meds  ? Glaucoma   ? Gout   ? H/O hiatal hernia   ? Hepatitis   ? hx of years ago   ? Hx of colonic polyps   ? Hypercholesteremia   ? Hypertension   ? Hypothyroid   ? IBS (irritable bowel syndrome)   ? Internal hemorrhoids   ? Obesity   ? OSA (obstructive sleep apnea)   ? NPSG 2009: AHI 20/h. CPAP - follows with [facility]  ? Renal insufficiency   ? one kidney small and non funtioning  ?  Tubular adenoma 2000  ? Venous insufficiency   ? ?Patient Active Problem List  ? Diagnosis Date Noted  ? Hypokalemia 05/27/2021  ? Bilateral carotid artery stenosis 03/25/2021  ? Grade II diastolic dysfunction 97/67/3419  ? Chest pain 02/11/2021  ? Dyspnea 02/11/2021  ? Systolic murmur 37/90/2409  ? Numbness and tingling in left hand 06/26/2019  ? Left shoulder pain 11/28/2018  ? Fatigue 02/08/2018  ? Right shoulder pain 03/21/2017  ? Bilateral pseudophakia 04/10/2016  ? Routine general medical examination at a health care facility 06/15/2014  ? Primary open angle glaucoma of both eyes, mild stage 07/10/2011  ? Obstructive sleep apnea 11/11/2007  ? GLAUCOMA 09/22/2007  ? CKD (chronic kidney disease) stage 3, GFR 30-59 ml/min (HCC) 03/21/2007  ? Hypothyroidism 01/04/2007  ? Hyperlipidemia 01/04/2007  ? GOUT 01/04/2007  ? Morbid obesity ([place]) 01/04/2007  ? Essential hypertension 01/04/2007  ? ?Home Medication(s) ?Prior to Admission medications   ?Medication Sig Start Date End Date Taking? Authorizing Provider  ?allopurinol (ZYLOPRIM) 100 MG tablet TAKE 1 TABLET BY MOUTH EVERY DAY 05/23/21   Hoyt Koch, MD  ?aspirin 81 MG tablet Take 81 mg by mouth daily.    [provider]  ?atenolol (TENORMIN) 50 MG tablet TAKE 2 TABLETS DAILY 01/26/21   Hoyt Koch, MD  ?dorzolamide-timolol (COSOPT) 22.3-6.8 MG/ML  ophthalmic solution Place 1 drop into both eyes 2 (two) times daily.    [provider]  ?latanoprost (XALATAN) 0.005 % ophthalmic solution Place 1 drop into both eyes at bedtime.    [provider]  ?levothyroxine (SYNTHROID) 50 MCG tablet TAKE 1 TABLET BY MOUTH DAILY BEFORE BREAKFAST 11/10/20   Hoyt Koch, MD  ?lisinopril (ZESTRIL) 10 MG tablet Take 10 mg by mouth as directed. Takes '10mg'$  in the am and '5mg'$  at bedtime 02/14/19   [provider]  ?simvastatin (ZOCOR) 20 MG tablet TAKE 1 TABLET DAILY AT 6PM 04/04/21   Hoyt Koch, MD   ?triamterene-hydrochlorothiazide (DYAZIDE) 37.5-25 MG capsule TAKE 1 CAPSULE BY MOUTH EVERY DAY 02/11/21   Hoyt Koch, MD  ?                                                                                                                                  ?Allergies ?Amlodipine besylate, Clonidine, Diltiazem hcl, Klonopin [clonazepam], Tetracycline, Doxycycline hyclate, Hydralazine, Iodine, Labetalol hcl, Lidocaine, and Olmesartan medoxomil ? ?Review of Systems ?Review of Systems  ?Respiratory:  Negative for shortness of breath.   ?Cardiovascular:  Negative for chest pain.  ?Gastrointestinal:  Negative for abdominal pain.  ?Neurological:  Positive for headaches (mild right sided).  ?As noted in HPI ? ?Physical Exam ?Vital Signs  ?I have reviewed the triage vital signs ?BP 133/71   Pulse 74   Temp 98.1 ?F (36.7 ?C) (Oral)   Resp 16   Ht '4\' 11"'$  (1.499 m)   Wt 81.2 kg   SpO2 95%   BMI 36.15 kg/m?  ? ?Physical Exam ?Vitals reviewed.  ?Constitutional:   ?   General: She is not in acute distress. ?   Appearance: She is well-developed. She is not diaphoretic.  ?HENT:  ?   Head: Normocephalic and atraumatic.  ?   Nose: Nose normal.  ?Eyes:  ?   General: No scleral icterus.    ?   Right eye: No discharge.     ?   Left eye: No discharge.  ?   Conjunctiva/sclera: Conjunctivae normal.  ?   Pupils: Pupils are equal, round, and reactive to light.  ?Cardiovascular:  ?   Rate and Rhythm: Normal rate and regular rhythm.  ?   Heart sounds: No murmur heard. ?  No friction rub. No gallop.  ?Pulmonary:  ?   Effort: Pulmonary effort is normal. No respiratory distress.  ?   Breath sounds: Normal breath sounds. No stridor. No rales.  ?Abdominal:  ?   General: There is no distension.  ?   Palpations: Abdomen is soft.  ?   Tenderness: There is no abdominal tenderness.  ?Musculoskeletal:     ?   General: No tenderness.  ?   Cervical back: Normal range of motion and neck supple.  ?Skin: ?   General: Skin is warm and dry.  ?  Findings: No erythema or rash.  ?Neurological:  ?   Mental Status: She is alert and oriented to person, place, and time.  ?   Motor: Motor function is intact.  ?   Coordination: Coordination is intact.  ?   Gait: Gait is intact.  ? ? ?ED Results and Treatments ?Labs ?(all labs ordered are listed, but only abnormal results are displayed) ?Labs Reviewed - No data to display                                                                                                                       ?EKG ? EKG Interpretation ? ?Date/Time:  Sunday June 05 2021 23:27:39 EDT ?Ventricular Rate:  53 ?PR Interval:  150 ?QRS Duration: 84 ?QT Interval:  426 ?QTC Calculation: 399 ?R Axis:   -20 ?Text Interpretation: Sinus bradycardia No acute changes When compared with ECG of 04-Jun-2021 01:03, PREVIOUS ECG IS PRESENT Confirmed by Addison Lank [phone]) on 06/06/2021 1:08:05 AM ?  ? ?  ? ?Radiology ?No results found. ? ?Pertinent labs & imaging results that were available during my care of the patient were reviewed by me and considered in my medical decision making (see MDM for details). ? ?Medications Ordered in ED ?Medications  ?acetaminophen (TYLENOL) tablet 1,000 mg (1,000 mg Oral Given 06/06/21 0149)  ?                                                               ?                                                                    ?Procedures ?Procedures ? ?(including critical care time) ? ?Medical Decision Making / ED Course ? ? ? Complexity of Problem: ? ?Patient's presenting problem/concern, DDX, and MDM listed below: ?Hypertension ?Initial systolics in the 038U.  Relatively  ? ?Hospitalization Considered:  ?no ? ?Initial Intervention:  ?Tylenol for mild headache ? ?  Complexity of Data: ?  ? ?Laboratory Tests ordered listed below with my independent interpretation: ?Not necessary. ?Patient had reassuring labs in the last 2 weeks following a PCP visit. ?  ?Imaging Studies ordered listed below with my independent  interpretation: ?Not indicated at this time ?  ?  ?ED Course:   ? ?Assessment, Add'l Intervention, and Reassessment: ?Hypertension ?Resolved without intervention. ?She was monitored for several hours and blood pressures trended down to the 130s

## 2021-06-23 ENCOUNTER — Encounter: Payer: Self-pay | Admitting: Internal Medicine

## 2021-06-23 ENCOUNTER — Ambulatory Visit (INDEPENDENT_AMBULATORY_CARE_PROVIDER_SITE_OTHER): Payer: Medicare Other | Admitting: Internal Medicine

## 2021-06-23 VITALS — BP 162/78 | HR 97 | Ht 59.0 in | Wt 177.0 lb

## 2021-06-23 DIAGNOSIS — K429 Umbilical hernia without obstruction or gangrene: Secondary | ICD-10-CM | POA: Diagnosis not present

## 2021-06-23 DIAGNOSIS — K5900 Constipation, unspecified: Secondary | ICD-10-CM | POA: Diagnosis not present

## 2021-06-23 NOTE — Progress Notes (Signed)
? ?DAISY LITES 88 y.o. Jan 15, 1932 [id number] ? ?Assessment & Plan:  ? ?Encounter Diagnoses  ?Name Primary?  ? Acute constipation Yes  ? Umbilical hernia without obstruction and without gangrene   ? ?Continue Benefiber and see me as needed.  No intervention for the umbilical hernia at this point given her age and lack of symptoms.  She was reassured about that in a patient handout was provided. ? ? ?Subjective:  ? ?Chief Complaint: Abdominal pain and constipation ? ?HPI ? ?88 year old African-American woman who developed transient constipation the other day and went to urgent care on April 7.  She had abdominal pain in the lower abdomen as well.  A KUB was negative.  They advised her to take some Benefiber.  Since that time she has been well but she followed up per their recommendations.  In general she moves her bowels well but she became constipated and lower abdominal discomfort.  No bleeding.  Previous colonoscopy 2011 ? ?Colonoscopy 2011 ?    1) Two polyps in the proximal transverse colon - diminutive and ?    removed adenomas ?    2) Diverticula, scattered in the left colon ?    3) Stenosis in the sigmoid colon - thought due to adhesions ?    4) Internal hemorrhoids ?    5) Otherwise normal examination ? ?Allergies  ?Allergen Reactions  ? Amlodipine Besylate Swelling and Other (See Comments)  ?  Causes swelling  ? Clonidine Nausea Only and Other (See Comments)  ?  Sweating, felt faint  ? Diltiazem Hcl Swelling and Other (See Comments)  ?  meds did not work for her----causes swelling  ? Klonopin [Clonazepam] Other (See Comments)  ?  Vivid dreams  ? Tetracycline Swelling  ? Doxycycline Hyclate Other (See Comments)  ?  REACTION:  tetracycline  ? Hydralazine Rash  ? Iodine Other (See Comments)  ?  IVP contrast ?Pt. States topical iodine is fine to use without any irritation. H.Mickley RN  ? Labetalol Hcl Other (See Comments)  ?  Unknown  ? Lidocaine Rash  ? Olmesartan Medoxomil Other (See Comments)  ?   Unknown  ? ?Current Meds  ?Medication Sig  ? allopurinol (ZYLOPRIM) 100 MG tablet TAKE 1 TABLET BY MOUTH EVERY DAY  ? aspirin 81 MG tablet Take 81 mg by mouth daily.  ? atenolol (TENORMIN) 50 MG tablet TAKE 2 TABLETS DAILY  ? dorzolamide-timolol (COSOPT) 22.3-6.8 MG/ML ophthalmic solution Place 1 drop into both eyes 2 (two) times daily.  ? latanoprost (XALATAN) 0.005 % ophthalmic solution Place 1 drop into both eyes at bedtime.  ? levothyroxine (SYNTHROID) 50 MCG tablet TAKE 1 TABLET BY MOUTH DAILY BEFORE BREAKFAST  ? lisinopril (ZESTRIL) 10 MG tablet Take 10 mg by mouth as directed. Takes '10mg'$  in the am and '5mg'$  at bedtime  ? simvastatin (ZOCOR) 20 MG tablet TAKE 1 TABLET DAILY AT 6PM  ? triamterene-hydrochlorothiazide (DYAZIDE) 37.5-25 MG capsule TAKE 1 CAPSULE BY MOUTH EVERY DAY  ? Wheat Dextrin (BENEFIBER DRINK MIX PO) Take by mouth daily.  ? ?Past Medical History:  ?Diagnosis Date  ? Anxiety   ? Benign neoplasm of kidney, except pelvis   ? Cellulitis of leg 03/09/2013 hosp   ? Cerebrovascular disease   ? Chronic low back pain   ? Diastolic CHF ([place])   ? Dr Daneen Schick  ? Difficult intubation   ? per patient with hand surgery in 2016 at surgical center- lip was pinched and swollen after surgery- patient  states dr Burney Gauze never stated a problem with intubation   ? Diverticulosis   ? DJD (degenerative joint disease)   ? Dyspnea   ? with exertion   ? GERD (gastroesophageal reflux disease)   ? no meds  ? Glaucoma   ? Gout   ? H/O hiatal hernia   ? Hepatitis   ? hx of years ago   ? Hx of colonic polyps   ? Hypercholesteremia   ? Hypertension   ? Hypothyroid   ? IBS (irritable bowel syndrome)   ? Internal hemorrhoids   ? Obesity   ? OSA (obstructive sleep apnea)   ? NPSG 2009: AHI 20/h. CPAP - follows with [facility]  ? Renal insufficiency   ? one kidney small and non funtioning  ? Tubular adenoma 2000  ? Venous insufficiency   ? ?Past Surgical History:  ?Procedure Laterality Date  ? ABDOMINAL HYSTERECTOMY    ?  APPENDECTOMY    ? CATARACT EXTRACTION, BILATERAL Bilateral 2017  ? cataracts Bilateral   ? COLONOSCOPY    ? CYSTOSCOPY/RETROGRADE/URETEROSCOPY Bilateral 04/09/2017  ? Procedure: BPZWCHENID/POEUMPNTIR;  Surgeon: Raynelle Bring, MD;  Location: [facility];  Service: Urology;  Laterality: Bilateral;  ONLY NEEDS 30 MIN FOR PROCEDURE  ? DILATION AND CURETTAGE OF UTERUS    ? FUNCTIONAL ENDOSCOPIC SINUS SURGERY  05/2003  ? Dr. Ernesto Rutherford  ? hysterctomy and ooperectomy  [phone]  ? MASS EXCISION  2003  ? lt  ? MASS EXCISION Left 09/17/2013  ? Procedure: EXCISION MASS LEFT HAND;  Surgeon: Schuyler Amor, MD;  Location: [facility];  Service: Orthopedics;  Laterality: Left;  ? thyroid lobectomy for a cyst  10/2000  ? Dr. Ernesto Rutherford  ? TONSILLECTOMY    ? ?Social History  ? ?Social History Narrative  ? Lives alone, indep - never married  ? g-dtr in [place], siblings, nieces in town  ? Keeps in touch with a few friends who talk regularly  ? ?family history includes Asthma in her brother and sister; Brain cancer (age of onset: 41) in her sister; Breast cancer (age of onset: 35) in her sister; Congestive Heart Failure in her mother; Diabetes in her mother; Heart disease in her mother; Throat cancer (age of onset: 66) in her brother. ? ? ?Review of Systems ? ?As above ?Objective:  ?BP (!) 162/78   Pulse 97   Ht '4\' 11"'$  (1.499 m)   Wt 177 lb (80.3 kg)   BMI 35.75 kg/m?  ?NAD elderly bw ?Abd obese, soft and NT low midline and umbilical scar ?Small soft and reducible umbilical hernia ? ?

## 2021-06-23 NOTE — Patient Instructions (Addendum)
Continue Benefiber.  ? ?Follow up as needed.  ? ?The [facility] providers would like to encourage you to use [facility] to communicate with providers for non-urgent requests or questions.  Due to long hold times on the telephone, sending your provider a message by [facility] may be a faster and more efficient way to get a response.  Please allow 48 business hours for a response.  Please remember that this is for non-urgent requests.   ? ? ?I appreciate the opportunity to care for you. ?Silvano Rusk, MD, [facility] ?

## 2021-06-27 ENCOUNTER — Encounter: Payer: Self-pay | Admitting: Internal Medicine

## 2021-06-27 ENCOUNTER — Ambulatory Visit (INDEPENDENT_AMBULATORY_CARE_PROVIDER_SITE_OTHER): Payer: Medicare Other | Admitting: Internal Medicine

## 2021-06-27 DIAGNOSIS — N1832 Chronic kidney disease, stage 3b: Secondary | ICD-10-CM

## 2021-06-27 DIAGNOSIS — I6523 Occlusion and stenosis of bilateral carotid arteries: Secondary | ICD-10-CM

## 2021-06-27 DIAGNOSIS — I5189 Other ill-defined heart diseases: Secondary | ICD-10-CM

## 2021-06-27 DIAGNOSIS — I10 Essential (primary) hypertension: Secondary | ICD-10-CM

## 2021-06-27 NOTE — Patient Instructions (Signed)
Keep doing the fiber for the stomach and plenty of fluids and monitoring the blood pressure. ?

## 2021-06-27 NOTE — Progress Notes (Signed)
? ?  Subjective:  ? ?Patient ID: April Donaldson, female    DOB: [birth year], 88 y.o.   MRN: [id number] ? ?HPI ?The patient is an 88 YO female coming in for ER follow up. Questions about recent health. ? ?Review of Systems  ?Constitutional: Negative.   ?HENT: Negative.    ?Eyes: Negative.   ?Respiratory:  Negative for cough, chest tightness and shortness of breath.   ?Cardiovascular:  Negative for chest pain, palpitations and leg swelling.  ?Gastrointestinal:  Negative for abdominal distention, abdominal pain, constipation, diarrhea, nausea and vomiting.  ?Musculoskeletal: Negative.   ?Skin: Negative.   ?Neurological: Negative.   ?Psychiatric/Behavioral: Negative.    ? ?Objective:  ?Physical Exam ?Constitutional:   ?   Appearance: She is well-developed. She is obese.  ?HENT:  ?   Head: Normocephalic and atraumatic.  ?Cardiovascular:  ?   Rate and Rhythm: Normal rate and regular rhythm.  ?Pulmonary:  ?   Effort: Pulmonary effort is normal. No respiratory distress.  ?   Breath sounds: Normal breath sounds. No wheezing or rales.  ?Abdominal:  ?   General: Bowel sounds are normal. There is no distension.  ?   Palpations: Abdomen is soft.  ?   Tenderness: There is no abdominal tenderness. There is no rebound.  ?Musculoskeletal:  ?   Cervical back: Normal range of motion.  ?Skin: ?   General: Skin is warm and dry.  ?Neurological:  ?   Mental Status: She is alert and oriented to person, place, and time.  ?   Coordination: Coordination normal.  ? ? ?Vitals:  ? 06/27/21 0857  ?BP: 124/80  ?Pulse: (!) 51  ?Resp: 18  ?SpO2: 98%  ?Weight: 176 lb (79.8 kg)  ?Height: '4\' 11"'$  (1.499 m)  ? ? ?This visit occurred during the SARS-CoV-2 public health emergency.  Safety protocols were in place, including screening questions prior to the visit, additional usage of staff PPE, and extensive cleaning of exam room while observing appropriate contact time as indicated for disinfecting solutions.  ? ?Assessment & Plan:  ?Visit time 20 minutes in  face to face communication with patient and coordination of care, additional 10 minutes spent in record review, coordination or care, ordering tests, communicating/referring to other healthcare professionals, documenting in medical records all on the same day of the visit for total time 30 minutes spent on the visit.  ? ?

## 2021-06-27 NOTE — Assessment & Plan Note (Signed)
Labs are stable and reviewed from most recent visit. BP at goal today but is labile at home sometimes.  ?

## 2021-06-27 NOTE — Assessment & Plan Note (Signed)
Counseled about this finding today and explained systolic and diastolic dysfunction and diet and lifestyle changes to help including controlled BP. ?

## 2021-06-27 NOTE — Assessment & Plan Note (Addendum)
BP at goal today on lisinopril 10 mg morning 5 mg evening and atenolol 100 mg daily and hctz 25 mg daily. Will continue at current dosing. She did have questions and need counseling about recent US carotid.  ?

## 2021-06-27 NOTE — Assessment & Plan Note (Signed)
Overall stable at 1-39% bilateral and unless change does not need further screening. Is taking simvastatin 20 mg daily.  ?

## 2021-07-28 ENCOUNTER — Ambulatory Visit (INDEPENDENT_AMBULATORY_CARE_PROVIDER_SITE_OTHER): Payer: Medicare Other | Admitting: Orthopaedic Surgery

## 2021-07-28 ENCOUNTER — Ambulatory Visit (INDEPENDENT_AMBULATORY_CARE_PROVIDER_SITE_OTHER): Payer: Medicare Other

## 2021-07-28 ENCOUNTER — Encounter: Payer: Self-pay | Admitting: Orthopaedic Surgery

## 2021-07-28 DIAGNOSIS — M542 Cervicalgia: Secondary | ICD-10-CM

## 2021-07-28 NOTE — Progress Notes (Signed)
Office Visit Note   Patient: April Donaldson           Date of Birth: [birth year]           MRN: [id number] Visit Date: 07/28/2021              Requested by: Hoyt Koch, MD [address] [place],  [place] [address] PCP: Hoyt Koch, MD   Assessment & Plan: Visit Diagnoses:  1. Neck pain     Plan: Pleasant 88 year old woman with a long history of low back pain that was followed by Dr. Junius Roads.  She presents today with a chief complaint of stiffness and pain in her neck that began a couple weeks ago.  She denies any injury.  She has not had this before.  She denies any paresthesias in her hands or weakness.  She actually says today is gotten somewhat better.  She does see a chiropractor on a regular basis and she is seeing the same 1 for 17 years.  She has a little arthritis in her spine by x-ray but overall well-maintained.  Since she has a good rapport with her chiropractor would recommend him to work on her neck a little bit.  Certainly we will be happy to refer her to physical therapy.  Follow-Up Instructions: No follow-ups on file.   Orders:  Orders Placed This Encounter  Procedures   XR Cervical Spine 2 or 3 views   No orders of the defined types were placed in this encounter.     Procedures: No procedures performed   Clinical Data: No additional findings.   Subjective: Chief Complaint  Patient presents with   Neck - Pain  Patient presents today for neck pain. She said that she woke two weeks ago with pain in her neck. No known injury. The pain has continued and now radiates into her upper arms bilaterally. No numbness or tingling in her upper extremities. She is right hand dominant. She does not like taking pain medicine, but will rarely take Tylenol if needed.    Review of Systems  All other systems reviewed and are negative.   Objective: Vital Signs: There were no vitals taken for this visit.  Physical Exam Constitutional:       Appearance: Normal appearance.  Pulmonary:     Effort: Pulmonary effort is normal.  Skin:    General: Skin is warm and dry.  Neurological:     Mental Status: She is alert.  Psychiatric:        Mood and Affect: Mood normal.        Behavior: Behavior normal.    Ortho Exam Patient appears well.  She has some mild stiffness with rotation of her head to the right and to the left but very little pain.  Has excellent flexibility with touching her chin to her chest and extension.  Deep tendon reflexes are congruent bilaterally.  She has good sensation in her hands.  She has good range of motion.  Her strength is good and equal bilaterally Specialty Comments:  No specialty comments available.  Imaging: No results found.   PMFS History: Patient Active Problem List   Diagnosis Date Noted   Hypokalemia 05/27/2021   Bilateral carotid artery stenosis 03/25/2021   Grade II diastolic dysfunction 16/08/3708   Chest pain 02/11/2021   Dyspnea 62/69/4854   Systolic murmur 62/70/3500   Numbness and tingling in left hand 06/26/2019   Left shoulder pain 11/28/2018   Fatigue 02/08/2018  Right shoulder pain 03/21/2017   Bilateral pseudophakia 04/10/2016   Routine general medical examination at a health care facility 06/15/2014   Primary open angle glaucoma of both eyes, mild stage 07/10/2011   Obstructive sleep apnea 11/11/2007   GLAUCOMA 09/22/2007   CKD (chronic kidney disease) stage 3, GFR 30-59 ml/min ([facility]) 03/21/2007   Hypothyroidism 01/04/2007   Hyperlipidemia 01/04/2007   GOUT 01/04/2007   Morbid obesity ([facility]) 01/04/2007   Essential hypertension 01/04/2007   Past Medical History:  Diagnosis Date   Anxiety    Benign neoplasm of kidney, except pelvis    Cellulitis of leg 03/09/2013 hosp    Cerebrovascular disease    Chronic low back pain    Diastolic CHF ([facility])    Dr Daneen Schick   Difficult intubation    per patient with hand surgery in 2016 at surgical center- lip was pinched and  swollen after surgery- patient states dr Burney Gauze never stated a problem with intubation    Diverticulosis    DJD (degenerative joint disease)    Dyspnea    with exertion    GERD (gastroesophageal reflux disease)    no meds   Glaucoma    Gout    H/O hiatal hernia    Hepatitis    hx of years ago    Hx of colonic polyps    Hypercholesteremia    Hypertension    Hypothyroid    IBS (irritable bowel syndrome)    Internal hemorrhoids    Obesity    OSA (obstructive sleep apnea)    NPSG 2009: AHI 20/h. CPAP - follows with [facility]   Renal insufficiency    one kidney small and non funtioning   Tubular adenoma 2000   Venous insufficiency     Family History  Problem Relation Age of Onset   Heart disease Mother    Diabetes Mother    Congestive Heart Failure Mother    Asthma Brother    Throat cancer Brother 56   Asthma Sister    Brain cancer Sister 91   Breast cancer Sister 12   Colon cancer Neg Hx    Esophageal cancer Neg Hx    Stomach cancer Neg Hx    Pancreatic cancer Neg Hx     Past Surgical History:  Procedure Laterality Date   ABDOMINAL HYSTERECTOMY     APPENDECTOMY     CATARACT EXTRACTION, BILATERAL Bilateral 2017   cataracts Bilateral    COLONOSCOPY     CYSTOSCOPY/RETROGRADE/URETEROSCOPY Bilateral 04/09/2017   Procedure: CYSTOSCOPY/RETROGRADE;  Surgeon: Raynelle Bring, MD;  Location: [facility];  Service: Urology;  Laterality: Bilateral;  ONLY NEEDS 30 MIN FOR PROCEDURE   DILATION AND CURETTAGE OF UTERUS     FUNCTIONAL ENDOSCOPIC SINUS SURGERY  05/2003   Dr. Ernesto Rutherford   hysterctomy and ooperectomy  1970's   MASS EXCISION  2003   lt   MASS EXCISION Left 09/17/2013   Procedure: EXCISION MASS LEFT HAND;  Surgeon: Schuyler Amor, MD;  Location: [facility];  Service: Orthopedics;  Laterality: Left;   thyroid lobectomy for a cyst  10/2000   Dr. Ernesto Rutherford   TONSILLECTOMY     Social History   Occupational History   Occupation: retired from worked in [employer]  plant    Employer: RETIRED  Tobacco Use   Smoking status: Never   Smokeless tobacco: Never  Vaping Use   Vaping Use: Never used  Substance and Sexual Activity   Alcohol use: No    Alcohol/week: 0.0 standard  drinks   Drug use: No   Sexual activity: Not Currently

## 2021-08-12 ENCOUNTER — Encounter: Payer: Self-pay | Admitting: Internal Medicine

## 2021-08-12 ENCOUNTER — Ambulatory Visit (INDEPENDENT_AMBULATORY_CARE_PROVIDER_SITE_OTHER): Payer: Medicare Other | Admitting: Internal Medicine

## 2021-08-12 ENCOUNTER — Ambulatory Visit (INDEPENDENT_AMBULATORY_CARE_PROVIDER_SITE_OTHER): Payer: Medicare Other

## 2021-08-12 VITALS — BP 152/78 | HR 59 | Temp 98.1°F | Ht 59.0 in | Wt 177.8 lb

## 2021-08-12 DIAGNOSIS — N1832 Chronic kidney disease, stage 3b: Secondary | ICD-10-CM

## 2021-08-12 DIAGNOSIS — M25511 Pain in right shoulder: Secondary | ICD-10-CM | POA: Diagnosis not present

## 2021-08-12 DIAGNOSIS — I10 Essential (primary) hypertension: Secondary | ICD-10-CM | POA: Diagnosis not present

## 2021-08-12 DIAGNOSIS — M25512 Pain in left shoulder: Secondary | ICD-10-CM | POA: Diagnosis not present

## 2021-08-12 DIAGNOSIS — M542 Cervicalgia: Secondary | ICD-10-CM | POA: Diagnosis not present

## 2021-08-12 NOTE — Patient Instructions (Signed)
Please continue all other medications as before, and refills have been done if requested.  Please have the pharmacy call with any other refills you may need.  Please keep your appointments with your specialists as you may have planned  Please go to the XRAY Department in the first floor for the x-ray testing  You will be contacted by phone if any changes need to be made immediately.  Otherwise, you will receive a letter about your results with an explanation, but please check with MyChart first.  Please remember to sign up for MyChart if you have not done so, as this will be important to you in the future with finding out test results, communicating by private email, and scheduling acute appointments online when needed.  You will be contacted regarding the referral for: Sports medicine

## 2021-08-12 NOTE — Progress Notes (Unsigned)
Patient ID: April Donaldson, female   DOB: 1931/09/15, 88 y.o.   MRN: [id number]        Chief Complaint: follow up low back pain, neck pain, ckd, bilateral shoudler pain       HPI:  April Donaldson is a 88 y.o. female with hx of chronic lbp managed with seeing chiropracter once monthly, now c/o right post lateral neck pain x 1 mo, sharp without specific radicular pain, has seen ortho June 1 Dr Durward Fortes, and plans to f/u chiropracter for this as well.  Now here with worsening bilateral shoulder pain not seeming related to neck pain, with pain worse to abduct each arm , left > right. Cant reach around behing or overhead as well for several months.  Pt denies chest pain, increased sob or doe, wheezing, orthopnea, PND, increased LE swelling, palpitations, dizziness or syncope.   Pt denies polydipsia, polyuria, or new focal neuro s/s.          Wt Readings from Last 3 Encounters:  08/12/21 177 lb 12.8 oz (80.6 kg)  06/27/21 176 lb (79.8 kg)  06/23/21 177 lb (80.3 kg)   BP Readings from Last 3 Encounters:  08/12/21 (!) 152/78  06/27/21 124/80  06/23/21 (!) 162/78         Past Medical History:  Diagnosis Date   Anxiety    Benign neoplasm of kidney, except pelvis    Cellulitis of leg 03/09/2013 hosp    Cerebrovascular disease    Chronic low back pain    Diastolic CHF ([facility])    Dr Daneen Schick   Difficult intubation    per patient with hand surgery in 2016 at surgical center- lip was pinched and swollen after surgery- patient states dr Burney Gauze never stated a problem with intubation    Diverticulosis    DJD (degenerative joint disease)    Dyspnea    with exertion    GERD (gastroesophageal reflux disease)    no meds   Glaucoma    Gout    H/O hiatal hernia    Hepatitis    hx of years ago    Hx of colonic polyps    Hypercholesteremia    Hypertension    Hypothyroid    IBS (irritable bowel syndrome)    Internal hemorrhoids    Obesity    OSA (obstructive sleep apnea)    NPSG 2009: AHI  20/h. CPAP - follows with [facility]   Renal insufficiency    one kidney small and non funtioning   Tubular adenoma 2000   Venous insufficiency    Past Surgical History:  Procedure Laterality Date   ABDOMINAL HYSTERECTOMY     APPENDECTOMY     CATARACT EXTRACTION, BILATERAL Bilateral 2017   cataracts Bilateral    COLONOSCOPY     CYSTOSCOPY/RETROGRADE/URETEROSCOPY Bilateral 04/09/2017   Procedure: CYSTOSCOPY/RETROGRADE;  Surgeon: Raynelle Bring, MD;  Location: [facility];  Service: Urology;  Laterality: Bilateral;  ONLY NEEDS 30 MIN FOR PROCEDURE   DILATION AND CURETTAGE OF UTERUS     FUNCTIONAL ENDOSCOPIC SINUS SURGERY  05/2003   Dr. Ernesto Rutherford   hysterctomy and ooperectomy  1970's   MASS EXCISION  2003   lt   MASS EXCISION Left 09/17/2013   Procedure: EXCISION MASS LEFT HAND;  Surgeon: Schuyler Amor, MD;  Location: [facility];  Service: Orthopedics;  Laterality: Left;   thyroid lobectomy for a cyst  10/2000   Dr. Ernesto Rutherford   TONSILLECTOMY      reports that she  has never smoked. She has never used smokeless tobacco. She reports that she does not drink alcohol and does not use drugs. family history includes Asthma in her brother and sister; Brain cancer (age of onset: 16) in her sister; Breast cancer (age of onset: 31) in her sister; Congestive Heart Failure in her mother; Diabetes in her mother; Heart disease in her mother; Throat cancer (age of onset: 73) in her brother. Allergies  Allergen Reactions   Amlodipine Besylate Swelling and Other (See Comments)    Causes swelling   Clonidine Nausea Only and Other (See Comments)    Sweating, felt faint   Diltiazem Hcl Swelling and Other (See Comments)    meds did not work for her----causes swelling   Klonopin [Clonazepam] Other (See Comments)    Vivid dreams   Tetracycline Swelling   Doxycycline Hyclate Other (See Comments)    REACTION:  tetracycline   Hydralazine Rash   Iodine Other (See Comments)    IVP contrast Pt.  States topical iodine is fine to use without any irritation. H.Mickley RN   Labetalol Hcl Other (See Comments)    Unknown   Lidocaine Rash   Olmesartan Medoxomil Other (See Comments)    Unknown   Current Outpatient Medications on File Prior to Visit  Medication Sig Dispense Refill   allopurinol (ZYLOPRIM) 100 MG tablet TAKE 1 TABLET BY MOUTH EVERY DAY 30 tablet 5   aspirin 81 MG tablet Take 81 mg by mouth daily.     atenolol (TENORMIN) 50 MG tablet TAKE 2 TABLETS DAILY 180 tablet 3   dorzolamide-timolol (COSOPT) 22.3-6.8 MG/ML ophthalmic solution Place 1 drop into both eyes 2 (two) times daily.     latanoprost (XALATAN) 0.005 % ophthalmic solution Place 1 drop into both eyes at bedtime.     levothyroxine (SYNTHROID) 50 MCG tablet TAKE 1 TABLET BY MOUTH DAILY BEFORE BREAKFAST 90 tablet 3   lisinopril (ZESTRIL) 10 MG tablet Take 10 mg by mouth as directed. Takes '10mg'$  in the am and '5mg'$  at bedtime     lisinopril (ZESTRIL) 5 MG tablet Take 5 mg by mouth at bedtime.     simvastatin (ZOCOR) 20 MG tablet TAKE 1 TABLET DAILY AT 6PM 90 tablet 3   triamterene-hydrochlorothiazide (DYAZIDE) 37.5-25 MG capsule TAKE 1 CAPSULE BY MOUTH EVERY DAY 90 capsule 1   Wheat Dextrin (BENEFIBER DRINK MIX PO) Take by mouth daily.     No current facility-administered medications on file prior to visit.        ROS:  All others reviewed and negative.  Objective        PE:  BP (!) 152/78 (BP Location: Right Arm, Patient Position: Sitting, Cuff Size: Large)   Pulse (!) 59   Temp 98.1 F (36.7 C) (Oral)   Ht '4\' 11"'$  (1.499 m)   Wt 177 lb 12.8 oz (80.6 kg)   SpO2 97%   BMI 35.91 kg/m                 Constitutional: Pt appears in NAD               HENT: Head: NCAT.                Right Ear: External ear normal.                 Left Ear: External ear normal.                Eyes: . Pupils are equal, round,  and reactive to light. Conjunctivae and EOM are normal               Nose: without d/c or deformity                Neck: Neck supple. Gross normal ROM               Cardiovascular: Normal rate and regular rhythm.                 Pulmonary/Chest: Effort normal and breath sounds without rales or wheezing.                Bilateral shoulder NT, but has pain on abduction and forward elevation , o/w neurovasc intact               Neurological: Pt is alert. At baseline orientation, motor grossly intact               Skin: Skin is warm. No rashes, no other new lesions, LE edema - none               Psychiatric: Pt behavior is normal without agitation   Micro: none  Cardiac tracings I have personally interpreted today:  none  Pertinent Radiological findings (summarize): none   Lab Results  Component Value Date   WBC 7.7 02/11/2021   HGB 14.1 02/11/2021   HCT 43.3 02/11/2021   PLT 262.0 02/11/2021   GLUCOSE 79 05/27/2021   CHOL 185 02/11/2021   TRIG 140.0 02/11/2021   HDL 65.30 02/11/2021   LDLCALC 92 02/11/2021   ALT 15 05/27/2021   AST 21 05/27/2021   NA 141 05/27/2021   K 3.9 05/27/2021   CL 105 05/27/2021   CREATININE 1.28 (H) 05/27/2021   BUN 22 05/27/2021   CO2 28 05/27/2021   TSH 3.07 02/11/2021   HGBA1C 5.3 02/11/2021   Assessment/Plan:  VONNIE SPAGNOLO is a 88 y.o. Black or African American [2] female with  has a past medical history of Anxiety, Benign neoplasm of kidney, except pelvis, Cellulitis of leg (03/09/2013 hosp ), Cerebrovascular disease, Chronic low back pain, Diastolic CHF ([place]), Difficult intubation, Diverticulosis, DJD (degenerative joint disease), Dyspnea, GERD (gastroesophageal reflux disease), Glaucoma, Gout, H/O hiatal hernia, Hepatitis, colonic polyps, Hypercholesteremia, Hypertension, Hypothyroid, IBS (irritable bowel syndrome), Internal hemorrhoids, Obesity, OSA (obstructive sleep apnea), Renal insufficiency, Tubular adenoma (2000), and Venous insufficiency.  Bilateral shoulder pain C/w probable rot cuff disorder bilateral, for referral sport med for possible  cortisone , and xrays today  Cervical pain (neck) C/w probable underlying cervical spine DJD, to f/u chiropracter as planned  CKD (chronic kidney disease) stage 3, GFR 30-59 ml/min Lab Results  Component Value Date   CREATININE 1.28 (H) 05/27/2021   Stable overall, cont to avoid nephrotoxins, avoid nsaid use  Essential hypertension BP Readings from Last 3 Encounters:  08/12/21 (!) 152/78  06/27/21 124/80  06/23/21 (!) 162/78   Mild elevated, likely reactive, pt to continue medical treatment tenormin 50 mg qd, dyazide 1 qd, lisinopril 10 d  Followup: Return if symptoms worsen or fail to improve.  Cathlean Cower, MD 08/14/2021 8:46 PM [facility]

## 2021-08-14 ENCOUNTER — Encounter: Payer: Self-pay | Admitting: Internal Medicine

## 2021-08-14 NOTE — Assessment & Plan Note (Signed)
BP Readings from Last 3 Encounters:  08/12/21 (!) 152/78  06/27/21 124/80  06/23/21 (!) 162/78   Mild elevated, likely reactive, pt to continue medical treatment tenormin 50 mg qd, dyazide 1 qd, lisinopril 10 d

## 2021-08-14 NOTE — Assessment & Plan Note (Signed)
Lab Results  Component Value Date   CREATININE 1.28 (H) 05/27/2021   Stable overall, cont to avoid nephrotoxins, avoid nsaid use

## 2021-08-14 NOTE — Assessment & Plan Note (Signed)
C/w probable rot cuff disorder bilateral, for referral sport med for possible cortisone , and xrays today

## 2021-08-14 NOTE — Assessment & Plan Note (Signed)
C/w probable underlying cervical spine DJD, to f/u chiropracter as planned

## 2021-08-16 NOTE — Progress Notes (Addendum)
April Donaldson D.April Donaldson April Donaldson Phone: [phone]   Assessment and Plan:     1. Bilateral shoulder pain, unspecified chronicity 2. Subacromial bursitis of both shoulders -Acute, uncomplicated, initial sports medicine visit - Worsening bilateral shoulder pain with subacute to chronic neck pain.  Most consistent with bilateral subacromial bursitis, likely irritated with patient compensating for neck pain - Reviewed bilateral shoulder x-rays with patient in clinic.  My interpretation: No acute fracture or dislocation.  Minimal cortical changes through glenohumeral and AC joints - Patient elected for bilateral subacromial CSI.  Tolerated well per note below - Do not recommend NSAID use due to patient age and comorbidities - Tylenol as needed for breakthrough pain - Start HEP.  Offered PT which patient declines at this time, though could be considered at future visit  Procedure: Subacromial Injection Side: Bilateral  Risks explained and consent was given verbally. The site was cleaned with alcohol prep. A steroid injection was performed from posterior approach using 32m of 1% lidocaine without epinephrine and 16mof kenalog '40mg'$ /ml. This was well tolerated and resulted in symptomatic relief.  Needle was removed, hemostasis achieved, and post injection instructions were explained.  Procedure was repeated on contralateral side.  Pt was advised to call or return to clinic if these symptoms worsen or fail to improve as anticipated.    Pertinent previous records reviewed include left shoulder x-ray 08/12/2021, right shoulder x-ray 08/12/2021, C-spine x-ray 07/28/2021   Follow Up: 3 to 4 weeks for reevaluation.  Could consider adding physical therapy if no improvement or worsening of symptoms   Subjective:   I, April Donaldson, am serving as a scEducation administratoror Doctor BeGlennon MacChief Complaint: neck and bilateral shoulder  pain  HPI:   08/17/2021 Patient is an [age]ear old female complaining of bilateral shoulder and neck pain. Patient states right post lateral neck pain x 1 mo, sharp without specific radicular pain, has seen ortho June 1 Dr WhDurward Fortesand plans to f/u chiropracter for this as well.  Now here with worsening bilateral shoulder pain not seeming related to neck pain, with pain worse to abduct each arm , left > right. Cant reach around behing or overhead as well for several months. States that her pain is a lot better , still some soreness ,last night she had a terrible ache in her neck on the left side  Relevant Historical Information: Hypertension, gout, OSA, CKD stage III  Additional pertinent review of systems negative.   Current Outpatient Medications:    allopurinol (ZYLOPRIM) 100 MG tablet, TAKE 1 TABLET BY MOUTH EVERY DAY, Disp: 30 tablet, Rfl: 5   aspirin 81 MG tablet, Take 81 mg by mouth daily., Disp: , Rfl:    atenolol (TENORMIN) 50 MG tablet, TAKE 2 TABLETS DAILY, Disp: 180 tablet, Rfl: 3   dorzolamide-timolol (COSOPT) 22.3-6.8 MG/ML ophthalmic solution, Place 1 drop into both eyes 2 (two) times daily., Disp: , Rfl:    latanoprost (XALATAN) 0.005 % ophthalmic solution, Place 1 drop into both eyes at bedtime., Disp: , Rfl:    levothyroxine (SYNTHROID) 50 MCG tablet, TAKE 1 TABLET BY MOUTH DAILY BEFORE BREAKFAST, Disp: 90 tablet, Rfl: 3   lisinopril (ZESTRIL) 10 MG tablet, Take 10 mg by mouth as directed. Takes '10mg'$  in the am and '5mg'$  at bedtime, Disp: , Rfl:    lisinopril (ZESTRIL) 5 MG tablet, Take 5 mg by mouth at bedtime., Disp: , Rfl:  simvastatin (ZOCOR) 20 MG tablet, TAKE 1 TABLET DAILY AT 6PM, Disp: 90 tablet, Rfl: 3   triamterene-hydrochlorothiazide (DYAZIDE) 37.5-25 MG capsule, TAKE 1 CAPSULE BY MOUTH EVERY DAY, Disp: 90 capsule, Rfl: 1   Wheat Dextrin (BENEFIBER DRINK MIX PO), Take by mouth daily., Disp: , Rfl:    Objective:     Vitals:   08/17/21 1425  BP: (!) 150/78   Pulse: (!) 57  SpO2: 99%  Weight: 177 lb (80.3 kg)  Height: '4\' 11"'$  (1.499 m)      Body mass index is 35.75 kg/m.    Physical Exam:    Gen: Appears well, nad, nontoxic and pleasant Neuro:sensation intact, strength is 5/5 with df/pf/inv/ev, muscle tone wnl Skin: no suspicious lesion or defmority Psych: A&O, appropriate mood and affect  Bilateral shoulder: no deformity, swelling or muscle wasting No scapular winging FF 150, abd 150, int 30, ext 80 TTP deltoid NTTP over the [place], clavicle, ac, coracoid, biceps groove, humerus,  , trapezius, cervical spine Positive Neer, Hawking's, empty can Neg  subscap liftoff, speeds,   Neg ant drawer, sulcus sign, apprehension Negative Spurling's test bilat FROM of neck    Electronically signed by:  April Donaldson D.[facility] 2:49 PM 08/17/21

## 2021-08-17 ENCOUNTER — Ambulatory Visit (INDEPENDENT_AMBULATORY_CARE_PROVIDER_SITE_OTHER): Payer: Medicare Other | Admitting: Sports Medicine

## 2021-08-17 ENCOUNTER — Other Ambulatory Visit: Payer: Self-pay | Admitting: Internal Medicine

## 2021-08-17 VITALS — BP 150/78 | HR 57 | Ht 59.0 in | Wt 177.0 lb

## 2021-08-17 DIAGNOSIS — M7552 Bursitis of left shoulder: Secondary | ICD-10-CM | POA: Diagnosis not present

## 2021-08-17 DIAGNOSIS — M25511 Pain in right shoulder: Secondary | ICD-10-CM

## 2021-08-17 DIAGNOSIS — M25512 Pain in left shoulder: Secondary | ICD-10-CM | POA: Diagnosis not present

## 2021-08-17 DIAGNOSIS — M7551 Bursitis of right shoulder: Secondary | ICD-10-CM

## 2021-08-17 NOTE — Patient Instructions (Addendum)
Good to see you Shoulder HEP 3 week follow up  

## 2021-09-07 NOTE — Progress Notes (Unsigned)
    April Donaldson D.[place] Phone: [phone]   Assessment and Plan:     There are no diagnoses linked to this encounter.  ***   Pertinent previous records reviewed include ***   Follow Up: ***     Subjective:   I, April Donaldson, am serving as a Education administrator for April Donaldson   Chief Complaint: neck and bilateral shoulder pain   HPI:    08/17/2021 Patient is an 88 year old female complaining of bilateral shoulder and neck pain. Patient states right post lateral neck pain x 1 mo, sharp without specific radicular pain, has seen ortho June 1 Dr Durward Fortes, and plans to f/u chiropracter for this as well.  Now here with worsening bilateral shoulder pain not seeming related to neck pain, with pain worse to abduct each arm , left > right. Cant reach around behing or overhead as well for several months. States that her pain is a lot better , still some soreness ,last night she had a terrible ache in her neck on the left side  09/08/2021 Patient states  Relevant Historical Information: Hypertension, gout, OSA, CKD stage III  Additional pertinent review of systems negative.   Current Outpatient Medications:    allopurinol (ZYLOPRIM) 100 MG tablet, TAKE 1 TABLET BY MOUTH EVERY DAY, Disp: 30 tablet, Rfl: 5   aspirin 81 MG tablet, Take 81 mg by mouth daily., Disp: , Rfl:    atenolol (TENORMIN) 50 MG tablet, TAKE 2 TABLETS DAILY, Disp: 180 tablet, Rfl: 3   dorzolamide-timolol (COSOPT) 22.3-6.8 MG/ML ophthalmic solution, Place 1 drop into both eyes 2 (two) times daily., Disp: , Rfl:    latanoprost (XALATAN) 0.005 % ophthalmic solution, Place 1 drop into both eyes at bedtime., Disp: , Rfl:    levothyroxine (SYNTHROID) 50 MCG tablet, TAKE 1 TABLET BY MOUTH DAILY BEFORE BREAKFAST, Disp: 90 tablet, Rfl: 3   lisinopril (ZESTRIL) 10 MG tablet, Take 10 mg by mouth as directed. Takes '10mg'$  in the am and '5mg'$  at bedtime,  Disp: , Rfl:    lisinopril (ZESTRIL) 5 MG tablet, Take 5 mg by mouth at bedtime., Disp: , Rfl:    simvastatin (ZOCOR) 20 MG tablet, TAKE 1 TABLET DAILY AT 6PM, Disp: 90 tablet, Rfl: 3   triamterene-hydrochlorothiazide (DYAZIDE) 37.5-25 MG capsule, TAKE 1 CAPSULE BY MOUTH EVERY DAY, Disp: 90 capsule, Rfl: 1   Wheat Dextrin (BENEFIBER DRINK MIX PO), Take by mouth daily., Disp: , Rfl:    Objective:     There were no vitals filed for this visit.    There is no height or weight on file to calculate BMI.    Physical Exam:    ***   Electronically signed by:  April Donaldson D.[facility] 2:14 PM 09/07/21

## 2021-09-08 ENCOUNTER — Ambulatory Visit (INDEPENDENT_AMBULATORY_CARE_PROVIDER_SITE_OTHER): Payer: Medicare Other | Admitting: Sports Medicine

## 2021-09-08 VITALS — BP 130/80 | HR 67 | Ht 59.0 in | Wt 174.0 lb

## 2021-09-08 DIAGNOSIS — M25511 Pain in right shoulder: Secondary | ICD-10-CM | POA: Diagnosis not present

## 2021-09-08 DIAGNOSIS — M7551 Bursitis of right shoulder: Secondary | ICD-10-CM | POA: Diagnosis not present

## 2021-09-08 DIAGNOSIS — M7552 Bursitis of left shoulder: Secondary | ICD-10-CM | POA: Diagnosis not present

## 2021-09-08 DIAGNOSIS — M25512 Pain in left shoulder: Secondary | ICD-10-CM

## 2021-09-08 NOTE — Patient Instructions (Addendum)
Good to see you  Pt referral  Tylenol 500  mg 2 times a day for pain relief  4-6 week follow up

## 2021-09-22 ENCOUNTER — Encounter: Payer: Self-pay | Admitting: Internal Medicine

## 2021-09-22 ENCOUNTER — Ambulatory Visit (INDEPENDENT_AMBULATORY_CARE_PROVIDER_SITE_OTHER): Payer: Medicare Other | Admitting: Internal Medicine

## 2021-09-22 VITALS — BP 124/72 | HR 57 | Resp 18 | Ht 59.0 in | Wt 173.6 lb

## 2021-09-22 DIAGNOSIS — E785 Hyperlipidemia, unspecified: Secondary | ICD-10-CM | POA: Diagnosis not present

## 2021-09-22 DIAGNOSIS — N1832 Chronic kidney disease, stage 3b: Secondary | ICD-10-CM

## 2021-09-22 DIAGNOSIS — E039 Hypothyroidism, unspecified: Secondary | ICD-10-CM

## 2021-09-22 DIAGNOSIS — G8929 Other chronic pain: Secondary | ICD-10-CM

## 2021-09-22 DIAGNOSIS — I10 Essential (primary) hypertension: Secondary | ICD-10-CM

## 2021-09-22 DIAGNOSIS — M25511 Pain in right shoulder: Secondary | ICD-10-CM

## 2021-09-22 DIAGNOSIS — M25512 Pain in left shoulder: Secondary | ICD-10-CM

## 2021-09-22 LAB — CBC
HCT: 33.6 % — ABNORMAL LOW (ref 36.0–46.0)
Hemoglobin: 10.9 g/dL — ABNORMAL LOW (ref 12.0–15.0)
MCHC: 32.6 g/dL (ref 30.0–36.0)
MCV: 87.6 fl (ref 78.0–100.0)
Platelets: 421 10*3/uL — ABNORMAL HIGH (ref 150.0–400.0)
RBC: 3.83 Mil/uL — ABNORMAL LOW (ref 3.87–5.11)
RDW: 13.9 % (ref 11.5–15.5)
WBC: 10.9 10*3/uL — ABNORMAL HIGH (ref 4.0–10.5)

## 2021-09-22 LAB — LIPID PANEL
Cholesterol: 129 mg/dL (ref 0–200)
HDL: 44.9 mg/dL (ref >39.00)
LDL Cholesterol: 66 mg/dL (ref 0–99)
NonHDL: 84.08
Total CHOL/HDL Ratio: 3
Triglycerides: 91 mg/dL (ref 0.0–149.0)
VLDL: 18.2 mg/dL (ref 0.0–40.0)

## 2021-09-22 LAB — TSH: TSH: 2.57 u[IU]/mL (ref 0.35–5.50)

## 2021-09-22 NOTE — Progress Notes (Signed)
   Subjective:   Patient ID: [name], female    DOB: Mar 19, 1931, 88 y.o.   MRN: [id number]  HPI The patient is an 88 YO female coming in for follow up and shoulder pain. Recent labs with nephrologist.   Review of Systems  Constitutional: Negative.   HENT: Negative.    Eyes: Negative.   Respiratory:  Negative for cough, chest tightness and shortness of breath.   Cardiovascular:  Negative for chest pain, palpitations and leg swelling.  Gastrointestinal:  Negative for abdominal distention, abdominal pain, constipation, diarrhea, nausea and vomiting.  Musculoskeletal:  Positive for arthralgias and myalgias.  Skin: Negative.   Neurological: Negative.   Psychiatric/Behavioral: Negative.      Objective:  Physical Exam Constitutional:      Appearance: She is well-developed.  HENT:     Head: Normocephalic and atraumatic.  Cardiovascular:     Rate and Rhythm: Normal rate and regular rhythm.  Pulmonary:     Effort: Pulmonary effort is normal. No respiratory distress.     Breath sounds: Normal breath sounds. No wheezing or rales.  Abdominal:     General: Bowel sounds are normal. There is no distension.     Palpations: Abdomen is soft.     Tenderness: There is no abdominal tenderness. There is no rebound.  Musculoskeletal:        General: Tenderness present.     Cervical back: Normal range of motion.  Skin:    General: Skin is warm and dry.  Neurological:     Mental Status: She is alert and oriented to person, place, and time.     Coordination: Coordination normal.     Vitals:   09/22/21 0957  BP: 124/72  Pulse: (!) 57  Resp: 18  SpO2: 97%  Weight: 173 lb 9.6 oz (78.7 kg)  Height: '4\' 11"'$  (1.499 m)    Assessment & Plan:

## 2021-09-23 NOTE — Assessment & Plan Note (Signed)
Checking TSH and adjust synthroid 50 mcg daily as needed. 

## 2021-09-23 NOTE — Assessment & Plan Note (Signed)
Recent labs with nephrology so will not repeat today. BP at goal and no DM.

## 2021-09-23 NOTE — Assessment & Plan Note (Signed)
Checking lipid panel and adjust simvastatin 20 mg daily as needed for LDL goal <100.

## 2021-09-23 NOTE — Assessment & Plan Note (Signed)
BP at goal today. Less variability at home with some weight loss. She is working on more weight loss. Taking atenolol 100 mg daily and lisinopril 15 mg daily (10 qam, 5 qpm). Recent renal function with nephrology so will not repeat today.

## 2021-09-23 NOTE — Assessment & Plan Note (Signed)
Can use tylenol for pain. She knows not to take NSAIDs due to CKD 3b.

## 2021-09-28 ENCOUNTER — Other Ambulatory Visit: Payer: Self-pay | Admitting: Internal Medicine

## 2021-09-28 DIAGNOSIS — D649 Anemia, unspecified: Secondary | ICD-10-CM

## 2021-09-30 ENCOUNTER — Telehealth: Payer: Self-pay | Admitting: Internal Medicine

## 2021-09-30 NOTE — Telephone Encounter (Signed)
See result note.  

## 2021-09-30 NOTE — Telephone Encounter (Signed)
Pt called and requested to speak with April Donaldson. She stated she got a message on her phone to call our office.   Please advise.

## 2021-10-03 NOTE — Telephone Encounter (Signed)
Pt would like her a copy of her labs sent to her via mail.  I was also able to sched the pt an apptmnt for the lab on 10/24/2021 for her CBC W/ Diff recheck.

## 2021-10-03 NOTE — Telephone Encounter (Signed)
Copy has been placed in the mail as requested by the patient.

## 2021-10-05 NOTE — Progress Notes (Deleted)
    April Donaldson D.[place] Phone: [phone]   Assessment and Plan:     There are no diagnoses linked to this encounter.  ***   Pertinent previous records reviewed include ***   Follow Up: ***     Subjective:   I, April Donaldson, am serving as a Education administrator for Doctor Glennon Mac   Chief Complaint: neck and bilateral shoulder pain   HPI:    08/17/2021 Patient is an 88 year old female complaining of bilateral shoulder and neck pain. Patient states right post lateral neck pain x 1 mo, sharp without specific radicular pain, has seen ortho June 1 Dr Durward Fortes, and plans to f/u chiropracter for this as well.  Now here with worsening bilateral shoulder pain not seeming related to neck pain, with pain worse to abduct each arm , left > right. Cant reach around behing or overhead as well for several months. States that her pain is a lot better , still some soreness ,last night she had a terrible ache in her neck on the left side   09/08/2021 Patient states she was feeling good yesterday and now today is not a good day , in the morning she is in pain but once the day goes on she gets better   10/13/2021 Patient states    Relevant Historical Information: Hypertension, gout, OSA, CKD stage III  Additional pertinent review of systems negative.   Current Outpatient Medications:    allopurinol (ZYLOPRIM) 100 MG tablet, TAKE 1 TABLET BY MOUTH EVERY DAY, Disp: 30 tablet, Rfl: 5   aspirin 81 MG tablet, Take 81 mg by mouth daily., Disp: , Rfl:    atenolol (TENORMIN) 50 MG tablet, TAKE 2 TABLETS DAILY, Disp: 180 tablet, Rfl: 3   dorzolamide-timolol (COSOPT) 22.3-6.8 MG/ML ophthalmic solution, Place 1 drop into both eyes 2 (two) times daily., Disp: , Rfl:    latanoprost (XALATAN) 0.005 % ophthalmic solution, Place 1 drop into both eyes at bedtime., Disp: , Rfl:    levothyroxine (SYNTHROID) 50 MCG tablet, TAKE 1 TABLET  BY MOUTH DAILY BEFORE BREAKFAST, Disp: 90 tablet, Rfl: 3   lisinopril (ZESTRIL) 10 MG tablet, Take 10 mg by mouth as directed. Takes '10mg'$  in the am and '5mg'$  at bedtime, Disp: , Rfl:    lisinopril (ZESTRIL) 5 MG tablet, Take 5 mg by mouth at bedtime., Disp: , Rfl:    simvastatin (ZOCOR) 20 MG tablet, TAKE 1 TABLET DAILY AT 6PM, Disp: 90 tablet, Rfl: 3   triamterene-hydrochlorothiazide (DYAZIDE) 37.5-25 MG capsule, TAKE 1 CAPSULE BY MOUTH EVERY DAY, Disp: 90 capsule, Rfl: 1   Wheat Dextrin (BENEFIBER DRINK MIX PO), Take by mouth daily., Disp: , Rfl:    Objective:     There were no vitals filed for this visit.    There is no height or weight on file to calculate BMI.    Physical Exam:    ***   Electronically signed by:  April Donaldson D.[facility] 8:46 AM 10/05/21

## 2021-10-12 ENCOUNTER — Ambulatory Visit (INDEPENDENT_AMBULATORY_CARE_PROVIDER_SITE_OTHER): Payer: Medicare Other | Admitting: Orthopedic Surgery

## 2021-10-12 ENCOUNTER — Encounter: Payer: Self-pay | Admitting: Orthopedic Surgery

## 2021-10-12 DIAGNOSIS — M25511 Pain in right shoulder: Secondary | ICD-10-CM

## 2021-10-12 NOTE — Progress Notes (Signed)
Office Visit Note   Patient: April Donaldson           Date of Birth: [birth year]           MRN: [id number] Visit Date: 10/12/2021 Requested by: Hoyt Koch, MD [address] [place],   [id number] PCP: Hoyt Koch, MD  Subjective: Chief Complaint  Patient presents with   Left Shoulder - Pain   Right Shoulder - Pain    HPI: Patient presents for evaluation of bilateral shoulder pain right worse than left.  She is right-hand dominant.  Has had pain for several months.  Pain does wake her from sleep at night and he is unable to lay on either side.  Reports neck pain with some radiating pain below the elbows.  No numbness and tingling.  Does describe some decreased range of motion bilateral shoulder stiffness.  Reports pain mornings of the shoulders.  Takes Tylenol as needed with minimal relief.  She has only 1 functioning kidney.  She has seen Dr. Glennon Mac with sports medicine due to want her to go to physical therapy also she received bilateral subacromial injections which gave her short-term.              ROS: All systems reviewed are negative as they relate to the chief complaint within the history of present illness.  Patient denies  fevers or chills.   Assessment & Plan: Visit Diagnoses:  1. Right shoulder pain, unspecified chronicity     Plan: Impression is bilateral shoulder pain right worse than left.  Rotator cuff strength is pretty reasonable and radiographs-Return arthritis.  She has had a physical therapy injection with continued pain.  MRI of right shoulder indicated to evaluate for bursitis versus occult glenohumeral arthritis to guide further injection therapy.  Do not anticipate surgical intervention.  Follow-up after study  Follow-Up Instructions: Return for after MRI.   Orders:  Orders Placed This Encounter  Procedures   MR SHOULDER RIGHT WO CONTRAST   No orders of the defined types were placed in this encounter.     Procedures: No  procedures performed   Clinical Data: No additional findings.  Objective: Vital Signs: There were no vitals taken for this visit.  Physical Exam:   Constitutional: Patient appears well-developed HEENT:  Head: Normocephalic Eyes:EOM are normal Neck: Normal range of motion Cardiovascular: Normal rate Pulmonary/chest: Effort normal Neurologic: Patient is alert Skin: Skin is warm Psychiatric: Patient has normal mood and affect   Ortho Exam: Ortho exam demonstrates good cervical spine range of motion.  5 out of 5 grip EPL FPL rotation/extension bicep triceps and deltoid strength.  Passive range of motion of both shoulders is 60/95/160.  Rotator cuff strength intact infraspinatus extremity subscap muscle testing.  No masses lymphadenopathy or skin changes noted in the shoulder regions.  Radial pulse intact.  No lymphadenopathy or AC joint tenderness in either shoulder.  Not too much coarse grinding or crepitus in the shoulder with internal/external rotation on the right-hand side at 90 degrees of abduction  Specialty Comments:  No specialty comments available.  Imaging: No results found.   PMFS History: Patient Active Problem List   Diagnosis Date Noted   Bilateral shoulder pain 08/12/2021   Cervical pain (neck) 07/28/2021   Hypokalemia 05/27/2021   Bilateral carotid artery stenosis 03/25/2021   Grade II diastolic dysfunction 82/42/3536   Chest pain 02/11/2021   Dyspnea 14/43/1540   Systolic murmur 08/67/6195   Numbness and tingling in left hand 06/26/2019  Left shoulder pain 11/28/2018   Fatigue 02/08/2018   Bilateral pseudophakia 04/10/2016   Routine general medical examination at a health care facility 06/15/2014   Primary open angle glaucoma of both eyes, mild stage 07/10/2011   Obstructive sleep apnea 11/11/2007   GLAUCOMA 09/22/2007   CKD (chronic kidney disease) stage 3, GFR 30-59 ml/min ([facility]) 03/21/2007   Hypothyroidism 01/04/2007   Hyperlipidemia 01/04/2007    GOUT 01/04/2007   Morbid obesity ([place]) 01/04/2007   Essential hypertension 01/04/2007   Past Medical History:  Diagnosis Date   Anxiety    Benign neoplasm of kidney, except pelvis    Cellulitis of leg 03/09/2013 hosp    Cerebrovascular disease    Chronic low back pain    Diastolic CHF ([facility])    Dr Daneen Schick   Difficult intubation    per patient with hand surgery in 2016 at surgical center- lip was pinched and swollen after surgery- patient states dr Burney Gauze never stated a problem with intubation    Diverticulosis    DJD (degenerative joint disease)    Dyspnea    with exertion    GERD (gastroesophageal reflux disease)    no meds   Glaucoma    Gout    H/O hiatal hernia    Hepatitis    hx of years ago    Hx of colonic polyps    Hypercholesteremia    Hypertension    Hypothyroid    IBS (irritable bowel syndrome)    Internal hemorrhoids    Obesity    OSA (obstructive sleep apnea)    NPSG 2009: AHI 20/h. CPAP - follows with [facility]   Renal insufficiency    one kidney small and non funtioning   Tubular adenoma 2000   Venous insufficiency     Family History  Problem Relation Age of Onset   Heart disease Mother    Diabetes Mother    Congestive Heart Failure Mother    Asthma Brother    Throat cancer Brother 58   Asthma Sister    Brain cancer Sister 54   Breast cancer Sister 56   Colon cancer Neg Hx    Esophageal cancer Neg Hx    Stomach cancer Neg Hx    Pancreatic cancer Neg Hx     Past Surgical History:  Procedure Laterality Date   ABDOMINAL HYSTERECTOMY     APPENDECTOMY     CATARACT EXTRACTION, BILATERAL Bilateral 2017   cataracts Bilateral    COLONOSCOPY     CYSTOSCOPY/RETROGRADE/URETEROSCOPY Bilateral 04/09/2017   Procedure: CYSTOSCOPY/RETROGRADE;  Surgeon: Raynelle Bring, MD;  Location: [facility];  Service: Urology;  Laterality: Bilateral;  ONLY NEEDS 30 MIN FOR PROCEDURE   DILATION AND CURETTAGE OF UTERUS     FUNCTIONAL ENDOSCOPIC SINUS SURGERY  05/2003   Dr.  Ernesto Rutherford   hysterctomy and ooperectomy  1970's   MASS EXCISION  2003   lt   MASS EXCISION Left 09/17/2013   Procedure: EXCISION MASS LEFT HAND;  Surgeon: Schuyler Amor, MD;  Location: [facility];  Service: Orthopedics;  Laterality: Left;   thyroid lobectomy for a cyst  10/2000   Dr. 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

## 2021-10-13 ENCOUNTER — Ambulatory Visit: Payer: Medicare Other | Admitting: Sports Medicine

## 2021-10-19 ENCOUNTER — Telehealth: Payer: Self-pay | Admitting: Internal Medicine

## 2021-10-19 NOTE — Telephone Encounter (Signed)
Pt is requesting a call. She would like to discuss her feet. She said they have been swollen since 10/13/21 and she is not able to put on her shoes. She said she has a lab appointment on Monday 10/24/21 but she would like to discuss her current feet issues to see if she may need any additional blood work ordered before her appt.    CB: [phone]

## 2021-10-20 ENCOUNTER — Telehealth: Payer: Self-pay | Admitting: Interventional Cardiology

## 2021-10-20 NOTE — Telephone Encounter (Signed)
Returned call to patient. She reports she has swelling of left foot for the past week. She states she is not sure if she has eaten more salt lately.  Patient denies SOB. She reports she is sedentary, and will elevate feet/legs when sitting. Advised patient to contact her PCP as this is who is managing her current fluid pill and other medications. Patient states she called Dr. Nathanial Millman office yesterday but has not heard back yet. Advised patient to call Dr. Nathanial Millman office again today to follow-up on possibly adding labs to lab appt for 8/28 (possibly BMET, BNP).  Patient states she will call PCPs office again about her swelling and lab work for 8/28.

## 2021-10-20 NOTE — Telephone Encounter (Signed)
Pt c/o swelling: STAT is pt has developed SOB within 24 hours  If swelling, where is the swelling located? Left foot swelling  How much weight have you gained and in what time span? Has not gained weight, she states she has lost some.   Have you gained 3 pounds in a day or 5 pounds in a week? no  Do you have a log of your daily weights (if so, list)? no  Are you currently taking a fluid pill? yes  Are you currently SOB? no  Have you traveled recently? no

## 2021-10-20 NOTE — Telephone Encounter (Signed)
Pt has stated she is having an on going issue with her feet that her foot doctor states she thinks its an heart issue causing it. Pt wants to know if there was any other labs that amy be needed to be ordered before her lab apptmnt on 10/24/2021.

## 2021-10-21 NOTE — Telephone Encounter (Signed)
Okay to get labs if needed on Monday.

## 2021-10-24 ENCOUNTER — Other Ambulatory Visit (INDEPENDENT_AMBULATORY_CARE_PROVIDER_SITE_OTHER): Payer: Medicare Other

## 2021-10-24 DIAGNOSIS — D649 Anemia, unspecified: Secondary | ICD-10-CM | POA: Diagnosis not present

## 2021-10-24 LAB — CBC WITH DIFFERENTIAL/PLATELET
Basophils Absolute: 0.1 10*3/uL (ref 0.0–0.1)
Basophils Relative: 0.6 % (ref 0.0–3.0)
Eosinophils Absolute: 0.1 10*3/uL (ref 0.0–0.7)
Eosinophils Relative: 0.7 % (ref 0.0–5.0)
HCT: 30.7 % — ABNORMAL LOW (ref 36.0–46.0)
Hemoglobin: 10 g/dL — ABNORMAL LOW (ref 12.0–15.0)
Lymphocytes Relative: 25.9 % (ref 12.0–46.0)
Lymphs Abs: 3.1 10*3/uL (ref 0.7–4.0)
MCHC: 32.7 g/dL (ref 30.0–36.0)
MCV: 85.5 fl (ref 78.0–100.0)
Monocytes Absolute: 1.3 10*3/uL — ABNORMAL HIGH (ref 0.1–1.0)
Monocytes Relative: 10.6 % (ref 3.0–12.0)
Neutro Abs: 7.4 10*3/uL (ref 1.4–7.7)
Neutrophils Relative %: 62.2 % (ref 43.0–77.0)
Platelets: 376 10*3/uL (ref 150.0–400.0)
RBC: 3.59 Mil/uL — ABNORMAL LOW (ref 3.87–5.11)
RDW: 14.8 % (ref 11.5–15.5)
WBC: 12 10*3/uL — ABNORMAL HIGH (ref 4.0–10.5)

## 2021-10-25 ENCOUNTER — Encounter: Payer: Self-pay | Admitting: Internal Medicine

## 2021-10-25 ENCOUNTER — Ambulatory Visit (INDEPENDENT_AMBULATORY_CARE_PROVIDER_SITE_OTHER): Payer: Medicare Other | Admitting: Internal Medicine

## 2021-10-25 VITALS — BP 136/70 | HR 75 | Temp 98.1°F | Ht 59.0 in | Wt 169.0 lb

## 2021-10-25 DIAGNOSIS — E119 Type 2 diabetes mellitus without complications: Secondary | ICD-10-CM

## 2021-10-25 DIAGNOSIS — M21969 Unspecified acquired deformity of unspecified lower leg: Secondary | ICD-10-CM | POA: Insufficient documentation

## 2021-10-25 DIAGNOSIS — M7989 Other specified soft tissue disorders: Secondary | ICD-10-CM | POA: Diagnosis not present

## 2021-10-25 DIAGNOSIS — D649 Anemia, unspecified: Secondary | ICD-10-CM | POA: Diagnosis not present

## 2021-10-25 DIAGNOSIS — G4733 Obstructive sleep apnea (adult) (pediatric): Secondary | ICD-10-CM

## 2021-10-25 DIAGNOSIS — N1832 Chronic kidney disease, stage 3b: Secondary | ICD-10-CM

## 2021-10-25 HISTORY — DX: Type 2 diabetes mellitus without complications: E11.9

## 2021-10-25 LAB — COMPREHENSIVE METABOLIC PANEL
ALT: 14 U/L (ref 0–35)
AST: 17 U/L (ref 0–37)
Albumin: 3.6 g/dL (ref 3.5–5.2)
Alkaline Phosphatase: 74 U/L (ref 39–117)
BUN: 23 mg/dL (ref 6–23)
CO2: 25 mEq/L (ref 19–32)
Calcium: 9.2 mg/dL (ref 8.4–10.5)
Chloride: 102 mEq/L (ref 96–112)
Creatinine, Ser: 1.09 mg/dL (ref 0.40–1.20)
GFR: 45.59 mL/min — ABNORMAL LOW (ref >60.00)
Glucose, Bld: 89 mg/dL (ref 70–99)
Potassium: 3.5 mEq/L (ref 3.5–5.1)
Sodium: 137 mEq/L (ref 135–145)
Total Bilirubin: 0.4 mg/dL (ref 0.2–1.2)
Total Protein: 7.8 g/dL (ref 6.0–8.3)

## 2021-10-25 LAB — POCT GLYCOSYLATED HEMOGLOBIN (HGB A1C): Hemoglobin A1C: 5.5 % (ref 4.0–5.6)

## 2021-10-25 LAB — BRAIN NATRIURETIC PEPTIDE: Pro B Natriuretic peptide (BNP): 40 pg/mL (ref 0.0–100.0)

## 2021-10-25 NOTE — Assessment & Plan Note (Signed)
POC HgA1c done today for monitoring and was 5.5 off medications. Will resolve and add to history since it has been <6 off meds for >1 year.

## 2021-10-25 NOTE — Assessment & Plan Note (Signed)
Checking CMP today given new ankle swelling.

## 2021-10-25 NOTE — Progress Notes (Signed)
   Subjective:   Patient ID: April Donaldson, female    DOB: February 08, 1932, 88 y.o.   MRN: [id number]  HPI The patient is an 88 YO female coming in for swelling in ankles.   Review of Systems  Constitutional: Negative.   HENT: Negative.    Eyes: Negative.   Respiratory:  Negative for cough, chest tightness and shortness of breath.   Cardiovascular:  Positive for leg swelling. Negative for chest pain and palpitations.  Gastrointestinal:  Negative for abdominal distention, abdominal pain, constipation, diarrhea, nausea and vomiting.  Musculoskeletal: Negative.   Skin: Negative.   Neurological: Negative.   Psychiatric/Behavioral: Negative.      Objective:  Physical Exam Constitutional:      Appearance: She is well-developed.  HENT:     Head: Normocephalic and atraumatic.  Cardiovascular:     Rate and Rhythm: Normal rate and regular rhythm.  Pulmonary:     Effort: Pulmonary effort is normal. No respiratory distress.     Breath sounds: Normal breath sounds. No wheezing or rales.  Abdominal:     General: Bowel sounds are normal. There is no distension.     Palpations: Abdomen is soft.     Tenderness: There is no abdominal tenderness. There is no rebound.  Musculoskeletal:     Cervical back: Normal range of motion.  Skin:    General: Skin is warm and dry.  Neurological:     Mental Status: She is alert and oriented to person, place, and time.     Coordination: Coordination normal.     Vitals:   10/25/21 1055  BP: 136/70  Pulse: 75  Temp: 98.1 F (36.7 C)  TempSrc: Oral  SpO2: 98%  Weight: 169 lb (76.7 kg)  Height: '4\' 11"'$  (1.499 m)    Assessment & Plan:

## 2021-10-25 NOTE — Assessment & Plan Note (Signed)
Checking CMP and BNP. Adjust as needed. Last echo 2021.

## 2021-10-25 NOTE — Assessment & Plan Note (Signed)
No clinical signs of blood loss. Reviewed labs done yesterday and there is some decline in Hg to 10. Will have her start taking iron daily. Monitor for signs of bleeding. She is not on blood thinner but taking aspirin.

## 2021-10-25 NOTE — Patient Instructions (Signed)
We will check the labs today. 

## 2021-10-25 NOTE — Assessment & Plan Note (Signed)
Not using CPAP lately. Advised to resume when able.

## 2021-10-27 ENCOUNTER — Ambulatory Visit: Payer: Medicare Other | Admitting: Orthopedic Surgery

## 2021-10-27 ENCOUNTER — Ambulatory Visit
Admission: RE | Admit: 2021-10-27 | Discharge: 2021-10-27 | Disposition: A | Discharge status: Home / Self Care | Payer: Medicare Other | Source: Ambulatory Visit | Attending: Orthopedic Surgery | Admitting: Orthopedic Surgery

## 2021-10-27 DIAGNOSIS — M25511 Pain in right shoulder: Secondary | ICD-10-CM

## 2021-11-02 ENCOUNTER — Encounter: Payer: Self-pay | Admitting: Orthopedic Surgery

## 2021-11-02 ENCOUNTER — Ambulatory Visit (INDEPENDENT_AMBULATORY_CARE_PROVIDER_SITE_OTHER): Payer: Medicare Other | Admitting: Orthopedic Surgery

## 2021-11-02 DIAGNOSIS — M79601 Pain in right arm: Secondary | ICD-10-CM

## 2021-11-02 NOTE — Progress Notes (Signed)
Office Visit Note   Patient: April Donaldson           Date of Birth: [birth year]           MRN: [id number] Visit Date: 11/02/2021 Requested by: Hoyt Koch, MD [address] [place],  [place] [address] PCP: Hoyt Koch, MD  Subjective: Chief Complaint  Patient presents with   Other     Scan review    HPI: [name] is an 88 year old patient with right shoulder pain.  Since she was last seen she had an MRI scan of her right shoulder.  Does show bursitis with no full-thickness rotator cuff tear.  Taking Tylenol for pain.  Having some left shoulder symptoms as well.  Part of her unscrew.  Had left carpal tunnel surgery done before and she is having similar symptoms on the right-hand side in terms of loss of strength.  Not too much in terms of numbness and tingling.  Had C-spine plain x-rays which did not show much arthritis.  Not too much radicular symptoms.             ROS: All systems reviewed are negative as they relate to the chief complaint within the history of present illness.  Patient denies  fevers or chills.   Assessment & Plan: Visit Diagnoses:  1. Right arm pain     Plan: Impression is right shoulder bursitis with potential for subacromial injection to give some relief.  Has had some of those before without too much relief that was sustained.  Again nothing operative in the shoulder.  She is having some right wrist and arm weakness.  Plan nerve conduction to evaluate for carpal tunnel syndrome.  I do not think a neck MRI scan indicated at this time based on history and exam.  Follow-up after that study.  Follow-Up Instructions: No follow-ups on file.   Orders:  Orders Placed This Encounter  Procedures   Ambulatory referral to Physical Medicine Rehab   No orders of the defined types were placed in this encounter.     Procedures: No procedures performed   Clinical Data: No additional findings.  Objective: Vital Signs: There were no vitals  taken for this visit.  Physical Exam:   Constitutional: Patient appears well-developed HEENT:  Head: Normocephalic Eyes:EOM are normal Neck: Normal range of motion Cardiovascular: Normal rate Pulmonary/chest: Effort normal Neurologic: Patient is alert Skin: Skin is warm Psychiatric: Patient has normal mood and affect   Ortho Exam: Ortho exam demonstrates good cervical spine range of motion.  In both shoulders she has passive range of motion of 50/95/170.  Rotator cuff strength is excellent with infraspinatus extremities and subscap muscle testing with no tenderness to [facility] joint palpation directly.  No coarse grinding or crepitus with internal/external rotation of the shoulder at 90 degrees of abduction.  Specialty Comments:  No specialty comments available.  Imaging: No results found.   PMFS History: Patient Active Problem List   Diagnosis Date Noted   Acquired deformity of lower leg 10/25/2021   Leg swelling 10/25/2021   Anemia 10/25/2021   Bilateral shoulder pain 08/12/2021   Cervical pain (neck) 07/28/2021   Hypokalemia 05/27/2021   Bilateral carotid artery stenosis 03/25/2021   Grade II diastolic dysfunction 68/34/1962   Chest pain 02/11/2021   Dyspnea 22/97/9892   Systolic murmur 11/94/1740   Numbness and tingling in left hand 06/26/2019   Left shoulder pain 11/28/2018   Fatigue 02/08/2018   Bilateral pseudophakia 04/10/2016   Routine  general medical examination at a health care facility 06/15/2014   Primary open angle glaucoma of both eyes, mild stage 07/10/2011   Obstructive sleep apnea 11/11/2007   GLAUCOMA 09/22/2007   CKD (chronic kidney disease) stage 3, GFR 30-59 ml/min ([place]) 03/21/2007   Hypothyroidism 01/04/2007   Hyperlipidemia 01/04/2007   GOUT 01/04/2007   Essential hypertension 01/04/2007   Past Medical History:  Diagnosis Date   Anxiety    Benign neoplasm of kidney, except pelvis    Cellulitis of leg 03/09/2013 hosp    Cerebrovascular disease     Chronic low back pain    Diabetes mellitus without complication ([place]) 06/20/5359   Diastolic CHF ([facility])    Dr Daneen Schick   Difficult intubation    per patient with hand surgery in 2016 at surgical center- lip was pinched and swollen after surgery- patient states dr Burney Gauze never stated a problem with intubation    Diverticulosis    DJD (degenerative joint disease)    Dyspnea    with exertion    GERD (gastroesophageal reflux disease)    no meds   Glaucoma    Gout    H/O hiatal hernia    Hepatitis    hx of years ago    Hx of colonic polyps    Hypercholesteremia    Hypertension    Hypothyroid    IBS (irritable bowel syndrome)    Internal hemorrhoids    Obesity    OSA (obstructive sleep apnea)    NPSG 2009: AHI 20/h. CPAP - follows with [facility]   Renal insufficiency    one kidney small and non funtioning   Tubular adenoma 2000   Venous insufficiency     Family History  Problem Relation Age of Onset   Heart disease Mother    Diabetes Mother    Congestive Heart Failure Mother    Asthma Brother    Throat cancer Brother 17   Asthma Sister    Brain cancer Sister 35   Breast cancer Sister 74   Colon cancer Neg Hx    Esophageal cancer Neg Hx    Stomach cancer Neg Hx    Pancreatic cancer Neg Hx     Past Surgical History:  Procedure Laterality Date   ABDOMINAL HYSTERECTOMY     APPENDECTOMY     CATARACT EXTRACTION, BILATERAL Bilateral 2017   cataracts Bilateral    COLONOSCOPY     CYSTOSCOPY/RETROGRADE/URETEROSCOPY Bilateral 04/09/2017   Procedure: CYSTOSCOPY/RETROGRADE;  Surgeon: Raynelle Bring, MD;  Location: [facility];  Service: Urology;  Laterality: Bilateral;  ONLY NEEDS 30 MIN FOR PROCEDURE   DILATION AND CURETTAGE OF UTERUS     FUNCTIONAL ENDOSCOPIC SINUS SURGERY  05/2003   Dr. Ernesto Rutherford   hysterctomy and ooperectomy  1970's   MASS EXCISION  2003   lt   MASS EXCISION Left 09/17/2013   Procedure: EXCISION MASS LEFT HAND;  Surgeon: Schuyler Amor, MD;  Location:  [facility];  Service: Orthopedics;  Laterality: Left;   thyroid lobectomy for a cyst  10/2000   Dr. 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

## 2021-11-08 ENCOUNTER — Ambulatory Visit (INDEPENDENT_AMBULATORY_CARE_PROVIDER_SITE_OTHER): Payer: Medicare Other | Admitting: Pulmonary Disease

## 2021-11-08 ENCOUNTER — Telehealth: Payer: Self-pay

## 2021-11-08 ENCOUNTER — Encounter: Payer: Self-pay | Admitting: Pulmonary Disease

## 2021-11-08 VITALS — BP 140/76 | HR 66 | Ht 59.0 in | Wt 170.6 lb

## 2021-11-08 DIAGNOSIS — G4733 Obstructive sleep apnea (adult) (pediatric): Secondary | ICD-10-CM | POA: Diagnosis not present

## 2021-11-08 DIAGNOSIS — Z9989 Dependence on other enabling machines and devices: Secondary | ICD-10-CM | POA: Diagnosis not present

## 2021-11-08 NOTE — Patient Instructions (Signed)
Follow up in 6 months 

## 2021-11-08 NOTE — Telephone Encounter (Signed)
Pt is scheduled to see [facility] and declined the appt for today. Pt did make an appt for tomorrow 140 with Dr. Mitchel Honour and will go to [facility] or ER if it worsens.   Pt states that she will try to increase iron in her diet and if that doesn't help she will then try the supplements again every other day.  FYI

## 2021-11-08 NOTE — Telephone Encounter (Signed)
Does any office location have apt today? Okay to do iron every other day. Iron gluconate is less likely to constipate. If this still constipates she can try increasing iron in her diet instead.

## 2021-11-08 NOTE — Progress Notes (Signed)
Pulmonary, Critical Care, and Sleep Medicine  Chief Complaint  Patient presents with   Follow-up    Hasn't used cpap in a few mo due to not being able to use hands    Constitutional:  BP (!) 140/76 (BP Location: Left Arm, Cuff Size: Large)   Pulse 66   Ht '4\' 11"'$  (1.499 m)   Wt 170 lb 9.6 oz (77.4 kg)   SpO2 98%   BMI 34.46 kg/m   Past Medical History:  IBS, Hypothyroidism, HTN, HLD, Colon polyps, HH, Gout, Glaucoma, GERD, CVA, Anxiety, Cellulitis Lt leg 2015, Chronic back pain, Difficult intubation  Past Surgical History:  She  has a past surgical history that includes hysterctomy and ooperectomy (1970's); thyroid lobectomy for a cyst (10/2000); Functional endoscopic sinus surgery (05/2003); Colonoscopy; Tonsillectomy; Appendectomy; Dilation and curettage of uterus; Mass excision (2003); Mass excision (Left, 09/17/2013); cataracts (Bilateral); Abdominal hysterectomy; Cystoscopy/retrograde/ureteroscopy (Bilateral, 04/09/2017); and Cataract extraction, bilateral (Bilateral, 2017).  Brief Summary:  April Donaldson is a 88 y.o. female with obstructive sleep apnea.      Subjective:   She is followed by Dr. Marlou Sa with orthopedics for Rt shoulder bursitis.  She has pain and numbness in her Rt > Lt arms and hands.  As a result she hasn't been able to put on her CPAP mask or put in water for her humidifier.   She is sleeping intermittently at night and during the day.  She is restless when sleeping due to arm and shoulder pain.  Physical Exam:   Appearance - well kempt   ENMT - no sinus tenderness, no oral exudate, no LAN, Mallampati 3 airway, no stridor  Respiratory - equal breath sounds bilaterally, no wheezing or rales  CV - s1s2 regular rate and rhythm, no murmurs  Ext - no clubbing, no edema  Skin - no rashes  Psych - normal mood and affect   Sleep Tests:  PSG 10/14/07 >> AHI 20 Auto CPAP 03/14/20 to 06/11/20 >> used on 71 of 90 nights with average 5 hrs 25 min.   Average AHI 0.3 with median CPAP 9 and 95 th percentile CPAP 12 cm H2O  Cardiac Tests:  Echo 02/12/20 >> EF 55 to 60%, grade 2 DD  Social History:  She  reports that she has never smoked. She has never used smokeless tobacco. She reports that she does not drink alcohol and does not use drugs.  Family History:  Her family history includes Asthma in her brother and sister; Brain cancer (age of onset: 80) in her sister; Breast cancer (age of onset: 14) in her sister; Congestive Heart Failure in her mother; Diabetes in her mother; Heart disease in her mother; Throat cancer (age of onset: 76) in her brother.     Assessment/Plan:   Obstructive sleep apnea. - she hasn't been able to use CPAP due to arm and hand weakness - gets supplies from [employer] - her current CPAP is more than 86 yrs old - will reassess her status in 6 months and then see if she should get a new auto CPAP device  Arm pain and weakness. - she is followed by orthopedic surgery   Obesity with deconditioning. - encouraged her to maintain a regular exercise regimen  Diastolic CHF. - followed by Dr. Daneen Schick with cardiology  Time Spent Involved in Patient Care on Day of Examination:  15 minutes  Follow up:   Patient Instructions  Follow up in 6 months  Medication List:   Allergies  as of 11/08/2021       Reactions   Amlodipine Besylate Swelling, Other (See Comments)   Causes swelling   Clonidine Nausea Only, Other (See Comments)   Sweating, felt faint   Diltiazem Hcl Swelling, Other (See Comments)   meds did not work for her----causes swelling   Klonopin [clonazepam] Other (See Comments)   Vivid dreams   Tetracycline Swelling   Doxycycline Hyclate Other (See Comments)   REACTION:  tetracycline   Hydralazine Rash   Iodine Other (See Comments)   IVP contrast Pt. States topical iodine is fine to use without any irritation. H.Mickley RN   Labetalol Hcl Other (See Comments)   Unknown   Lidocaine Rash    Olmesartan Medoxomil Other (See Comments)   Unknown        Medication List        Accurate as of November 08, 2021  2:51 PM. If you have any questions, ask your nurse or doctor.          allopurinol 100 MG tablet Commonly known as: ZYLOPRIM TAKE 1 TABLET BY MOUTH EVERY DAY   aspirin 81 MG tablet Take 81 mg by mouth daily.   atenolol 50 MG tablet Commonly known as: TENORMIN TAKE 2 TABLETS DAILY   BENEFIBER DRINK MIX PO Take by mouth daily.   dorzolamide-timolol 22.3-6.8 MG/ML ophthalmic solution Commonly known as: COSOPT Place 1 drop into both eyes 2 (two) times daily.   latanoprost 0.005 % ophthalmic solution Commonly known as: XALATAN Place 1 drop into both eyes at bedtime.   levothyroxine 50 MCG tablet Commonly known as: SYNTHROID TAKE 1 TABLET BY MOUTH DAILY BEFORE BREAKFAST   lisinopril 10 MG tablet Commonly known as: ZESTRIL Take 10 mg by mouth as directed. Takes '10mg'$  in the am and '5mg'$  at bedtime   lisinopril 5 MG tablet Commonly known as: ZESTRIL Take 5 mg by mouth at bedtime.   simvastatin 20 MG tablet Commonly known as: ZOCOR TAKE 1 TABLET DAILY AT 6PM   triamterene-hydrochlorothiazide 37.5-25 MG capsule Commonly known as: DYAZIDE TAKE 1 CAPSULE BY MOUTH EVERY DAY        Signature:  Chesley Mires, MD [place] Pager - [phone] 11/08/2021, 2:51 PM

## 2021-11-08 NOTE — Telephone Encounter (Signed)
Pt is calling stating that she noticed that her bottom lip was swollen yesterday. Pt called nurse line yesterday they advised her to get an appt with PCP. Currently we do not have any openings available until tomorrow.   Pt was advised to start taking iron supplements and she took only 2 and became constipated so she discontinued taking them. Pt is wanting a recommendation if she needs to continue taking them what can she do to stay regular and not get constipated.    Please advise

## 2021-11-09 ENCOUNTER — Encounter: Payer: Self-pay | Admitting: Emergency Medicine

## 2021-11-09 ENCOUNTER — Ambulatory Visit (INDEPENDENT_AMBULATORY_CARE_PROVIDER_SITE_OTHER): Payer: Medicare Other | Admitting: Emergency Medicine

## 2021-11-09 VITALS — BP 130/82 | HR 81 | Temp 98.2°F | Ht 59.0 in | Wt 170.0 lb

## 2021-11-09 DIAGNOSIS — T7840XA Allergy, unspecified, initial encounter: Secondary | ICD-10-CM

## 2021-11-09 NOTE — Progress Notes (Signed)
April Donaldson 88 y.o.   Chief Complaint  Patient presents with  . Facial Swelling    Pt stated the swelling started last night possibly due to something being eaten.    HISTORY OF PRESENT ILLNESS: Acute problem visit today.  Patient of Dr. Pricilla Holm. This is a 88 y.o. female developed allergic reaction with lip swelling last night.  Suspicious of pickled beets she ate.  Much better today. No other complaints or medical concerns today.  HPI   Prior to Admission medications   Medication Sig Start Date End Date Taking? Authorizing Provider  allopurinol (ZYLOPRIM) 100 MG tablet TAKE 1 TABLET BY MOUTH EVERY DAY 05/23/21  Yes Hoyt Koch, MD  aspirin 81 MG tablet Take 81 mg by mouth daily.   Yes [provider]  atenolol (TENORMIN) 50 MG tablet TAKE 2 TABLETS DAILY 01/26/21  Yes Hoyt Koch, MD  dorzolamide-timolol (COSOPT) 22.3-6.8 MG/ML ophthalmic solution Place 1 drop into both eyes 2 (two) times daily.   Yes [provider]  latanoprost (XALATAN) 0.005 % ophthalmic solution Place 1 drop into both eyes at bedtime.   Yes [provider]  levothyroxine (SYNTHROID) 50 MCG tablet TAKE 1 TABLET BY MOUTH DAILY BEFORE BREAKFAST 11/10/20  Yes Hoyt Koch, MD  lisinopril (ZESTRIL) 10 MG tablet Take 10 mg by mouth as directed. Takes '10mg'$  in the am and '5mg'$  at bedtime 02/14/19  Yes [provider]  lisinopril (ZESTRIL) 5 MG tablet Take 5 mg by mouth at bedtime. 05/30/21  Yes [provider]  simvastatin (ZOCOR) 20 MG tablet TAKE 1 TABLET DAILY AT 6PM 04/04/21  Yes Hoyt Koch, MD  triamterene-hydrochlorothiazide (DYAZIDE) 37.5-25 MG capsule TAKE 1 CAPSULE BY MOUTH EVERY DAY 08/17/21  Yes Hoyt Koch, MD  Wheat Dextrin (BENEFIBER DRINK MIX PO) Take by mouth daily.   Yes [provider]    Allergies  Allergen Reactions  . Amlodipine Besylate Swelling and Other (See Comments)    Causes  swelling  . Clonidine Nausea Only and Other (See Comments)    Sweating, felt faint  . Diltiazem Hcl Swelling and Other (See Comments)    meds did not work for her----causes swelling  . Klonopin [Clonazepam] Other (See Comments)    Vivid dreams  . Tetracycline Swelling  . Doxycycline Hyclate Other (See Comments)    REACTION:  tetracycline  . Hydralazine Rash  . Iodine Other (See Comments)    IVP contrast Pt. States topical iodine is fine to use without any irritation. H.Mickley RN  . Labetalol Hcl Other (See Comments)    Unknown  . Lidocaine Rash  . Olmesartan Medoxomil Other (See Comments)    Unknown    Patient Active Problem List   Diagnosis Date Noted  . Acquired deformity of lower leg 10/25/2021  . Leg swelling 10/25/2021  . Anemia 10/25/2021  . Bilateral shoulder pain 08/12/2021  . Cervical pain (neck) 07/28/2021  . Hypokalemia 05/27/2021  . Bilateral carotid artery stenosis 03/25/2021  . Grade II diastolic dysfunction 89/38/1017  . Chest pain 02/11/2021  . Dyspnea 02/11/2021  . Systolic murmur 51/03/5850  . Numbness and tingling in left hand 06/26/2019  . Left shoulder pain 11/28/2018  . Fatigue 02/08/2018  . Bilateral pseudophakia 04/10/2016  . Routine general medical examination at a health care facility 06/15/2014  . Primary open angle glaucoma of both eyes, mild stage 07/10/2011  . Obstructive sleep apnea 11/11/2007  . GLAUCOMA 09/22/2007  . CKD (chronic kidney disease) stage  3, GFR 30-59 ml/min (HCC) 03/21/2007  . Hypothyroidism 01/04/2007  . Hyperlipidemia 01/04/2007  . GOUT 01/04/2007  . Essential hypertension 01/04/2007    Past Medical History:  Diagnosis Date  . Anxiety   . Benign neoplasm of kidney, except pelvis   . Cellulitis of leg 03/09/2013 hosp   . Cerebrovascular disease   . Chronic low back pain   . Diabetes mellitus without complication ([facility]) 10/07/9145  . Diastolic CHF ([facility])    Dr Daneen Schick  . Difficult intubation    per patient  with hand surgery in 2016 at surgical center- lip was pinched and swollen after surgery- patient states dr Burney Gauze never stated a problem with intubation   . Diverticulosis   . DJD (degenerative joint disease)   . Dyspnea    with exertion   . GERD (gastroesophageal reflux disease)    no meds  . Glaucoma   . Gout   . H/O hiatal hernia   . Hepatitis    hx of years ago   . Hx of colonic polyps   . Hypercholesteremia   . Hypertension   . Hypothyroid   . IBS (irritable bowel syndrome)   . Internal hemorrhoids   . Obesity   . OSA (obstructive sleep apnea)    NPSG 2009: AHI 20/h. CPAP - follows with [facility]  . Renal insufficiency    one kidney small and non funtioning  . Tubular adenoma 2000  . Venous insufficiency     Past Surgical History:  Procedure Laterality Date  . ABDOMINAL HYSTERECTOMY    . APPENDECTOMY    . CATARACT EXTRACTION, BILATERAL Bilateral 2017  . cataracts Bilateral   . COLONOSCOPY    . CYSTOSCOPY/RETROGRADE/URETEROSCOPY Bilateral 04/09/2017   Procedure: CYSTOSCOPY/RETROGRADE;  Surgeon: Raynelle Bring, MD;  Location: [facility];  Service: Urology;  Laterality: Bilateral;  ONLY NEEDS 30 MIN FOR PROCEDURE  . DILATION AND CURETTAGE OF UTERUS    . FUNCTIONAL ENDOSCOPIC SINUS SURGERY  05/2003   Dr. Ernesto Rutherford  . hysterctomy and ooperectomy  1970's  . MASS EXCISION  2003   lt  . MASS EXCISION Left 09/17/2013   Procedure: EXCISION MASS LEFT HAND;  Surgeon: Schuyler Amor, MD;  Location: [facility];  Service: Orthopedics;  Laterality: Left;  . thyroid lobectomy for a cyst  10/2000   Dr. Ernesto Rutherford  . TONSILLECTOMY      Social History   Socioeconomic History  . Marital status: Single    Spouse name: Not on file  . Number of children: 1  . Years of education: Not on file  . Highest education level: Not on file  Occupational History  . Occupation: retired from worked in Engineer, manufacturing systems: RETIRED  Tobacco Use  . Smoking status: Never  .  Smokeless tobacco: Never  Vaping Use  . Vaping Use: Never used  Substance and Sexual Activity  . Alcohol use: No    Alcohol/week: 0.0 standard drinks of alcohol  . Drug use: No  . Sexual activity: Not Currently  Other Topics Concern  . Not on file  Social History Narrative   Lives alone, indep - never married   g-dtr in [place], siblings, nieces in [place] in touch with a few friends who talk regularly   Social Determinants of Health   Financial Resource Strain: [place]  (01/17/2021)   Overall Financial Resource Strain (CARDIA)   . Difficulty of Paying Living Expenses: Not hard at all  Food Insecurity:  No Food Insecurity (01/17/2021)   Hunger Vital Sign   . Worried About Charity fundraiser in the Last Year: Never true   . Ran Out of Food in the Last Year: Never true  Transportation Needs: No Transportation Needs (01/17/2021)   PRAPARE - Transportation   . Lack of Transportation (Medical): No   . Lack of Transportation (Non-Medical): No  Physical Activity: Sufficiently Active (01/17/2021)   Exercise Vital Sign   . Days of Exercise per Week: 5 days   . Minutes of Exercise per Session: 30 min  Stress: No Stress Concern Present (01/17/2021)   [place]   . Feeling of Stress : Not at all  Social Connections: Moderately Isolated (01/17/2021)   Social Connection and Isolation Panel [NHANES]   . Frequency of Communication with Friends and Family: More than three times a week   . Frequency of Social Gatherings with Friends and Family: More than three times a week   . Attends Religious Services: More than 4 times per year   . Active Member of Clubs or Organizations: No   . Attends Archivist Meetings: Never   . Marital Status: Widowed  Intimate Partner Violence: Not At Risk (01/17/2021)   Humiliation, Afraid, Rape, and Kick questionnaire   . Fear of Current or Ex-Partner: No   . Emotionally  Abused: No   . Physically Abused: No   . Sexually Abused: No    Family History  Problem Relation Age of Onset  . Heart disease Mother   . Diabetes Mother   . Congestive Heart Failure Mother   . Asthma Brother   . Throat cancer Brother 60  . Asthma Sister   . Brain cancer Sister 25  . Breast cancer Sister 74  . Colon cancer Neg Hx   . Esophageal cancer Neg Hx   . Stomach cancer Neg Hx   . Pancreatic cancer Neg Hx      Review of Systems  Constitutional: Negative.  Negative for chills and fever.  HENT: Negative.  Negative for congestion and sore throat.   Respiratory: Negative.  Negative for cough and shortness of breath.   Cardiovascular: Negative.  Negative for chest pain and palpitations.  Gastrointestinal:  Negative for abdominal pain, diarrhea, nausea and vomiting.  Genitourinary: Negative.   Skin: Negative.  Negative for itching and rash.  Neurological: Negative.  Negative for dizziness and headaches.  All other systems reviewed and are negative.   Today's Vitals   11/09/21 1339  BP: 130/82  Pulse: 81  Temp: 98.2 F (36.8 C)  TempSrc: Oral  SpO2: 90%  Weight: 170 lb (77.1 kg)  Height: '4\' 11"'$  (1.499 m)   Body mass index is 34.34 kg/m.  Physical Exam Vitals reviewed.  Constitutional:      Appearance: Normal appearance.  HENT:     Head: Normocephalic.     Mouth/Throat:     Mouth: Mucous membranes are moist.     Pharynx: Oropharynx is clear.     Comments: No swollen lips.  No angioedema Eyes:     Extraocular Movements: Extraocular movements intact.     Conjunctiva/sclera: Conjunctivae normal.     Pupils: Pupils are equal, round, and reactive to light.  Neck:     Comments: Surgical scar from partial thyroidectomy Cardiovascular:     Rate and Rhythm: Normal rate and regular rhythm.     Heart sounds: Murmur heard.  Pulmonary:     Effort: Pulmonary  effort is normal.     Breath sounds: Normal breath sounds.  Musculoskeletal:     Cervical back: No  tenderness.  Lymphadenopathy:     Cervical: No cervical adenopathy.  Neurological:     General: No focal deficit present.     Mental Status: She is alert and oriented to person, place, and time.  Psychiatric:        Mood and Affect: Mood normal.        Behavior: Behavior normal.     ASSESSMENT & PLAN: A total of 30 minutes was spent with the patient and counseling/coordination of care regarding preparing for this visit, review of most recent office visit notes, review of multiple chronic medical problems and their treatment, review of all medications, diagnosis of localized allergic reaction and need to avoid trigger, treatment, prognosis, documentation and need for follow-up if no better or worse during the next several days.  Problem List Items Addressed This Visit       Other   Allergic reaction - Primary    Clinically stable.  No anaphylaxis. Back to normal today. Advised to avoid trigger. Keep Benadryl at home for future occasions. ED precautions given. Advised to contact the office if symptoms return.      Patient Instructions  Allergies, Adult An allergy means that your body reacts to something that bothers it (allergen). This can happen from something that you eat, breathe in, or touch. Allergies often affect the nose, eyes, skin, and stomach. They can be mild, moderate, or very bad (severe). An allergy cannot spread from person to person. They can happen at any age. Sometimes, people outgrow them. What are the causes? Outdoor things, such as pollen, car fumes, and mold. Indoor things, such as dust, smoke, mold, and pets. Foods. Medicines. Things that bother your skin, such as perfume and bug bites. What increases the risk? Having family members with allergies or asthma. What are the signs or symptoms? Symptoms depend on how bad your allergy is. Mild to moderate symptoms Runny nose, stuffy nose, or sneezing. Itchy mouth, ears, or throat. A feeling of mucus  dripping down the back of your throat. Sore throat. Eyes that are itchy, red, watery, or puffy. A skin rash, or red, swollen areas of skin (hives). Stomach cramps or bloating. Severe symptoms Very bad allergies to food, medicine, or bug bites may cause a very bad allergy reaction (anaphylaxis). This can be life-threatening. Symptoms include: A red face. Wheezing or coughing. Swollen lips, tongue, or mouth. Tight or swollen throat. Chest pain or tightness, or a fast heartbeat. Trouble breathing or shortness of breath. Pain in your belly (abdomen), vomiting, or watery poop (diarrhea). Feeling dizzy or fainting. How is this treated?     Treatment for this condition depends on your symptoms. Treatment may include: Cold, wet cloths for itching and swelling. Eye drops, nose sprays, or skin creams. Washing out your nose each day. A humidifier. Medicines. A change to the foods you eat. Being exposed again and again to tiny amounts of allergens. This helps your body get used to them. You might have: Allergy shots. Very small amounts of allergen put under your tongue. An emergency shot (auto-injector pen) if you have a very bad allergy reaction. This is a medicine with a needle. You can put it into your skin by yourself. Your doctor will teach you how to use it. Follow these instructions at home: Medicines  Take or apply over-the-counter and prescription medicines only as told by your doctor.  If you are at risk for a very bad allergy reaction, keep an auto-injector pen with you all the time. Eating and drinking Follow instructions from your doctor about what to eat and drink. Drink enough fluid to keep your pee (urine) pale yellow. General instructions If you have ever had a very bad allergy reaction, wear a medical alert bracelet or necklace. Stay away from things that you are allergic to. Keep all follow-up visits as told by your doctor. This is important. Contact a doctor  if: Your symptoms do not get better with treatment. Get help right away if: You have symptoms of a very bad allergy reaction. These include: A swollen mouth, tongue, or throat. Pain or tightness in your chest. Trouble breathing. Being short of breath. Dizziness. Fainting. Very bad pain in your belly. Vomiting. Watery poop. These symptoms may be an emergency. Do not wait to see if the symptoms will go away. Get medical help right away. Call your local emergency services (911 in the [place] Do not drive yourself to the hospital. Summary Take or apply over-the-counter and prescription medicines only as told by your doctor. Stay away from things you are allergic to. If you are at risk for a very bad allergy reaction, carry an auto-injector pen all the time. Wear a medical alert bracelet or necklace. Very bad allergy reactions can be life-threatening. Get help right away. This information is not intended to replace advice given to you by your health care provider. Make sure you discuss any questions you have with your health care provider. Document Revised: 12/25/2018 Document Reviewed: 12/25/2018 Elsevier Patient Education  [place], MD [facility]

## 2021-11-09 NOTE — Patient Instructions (Signed)
Allergies, Adult An allergy means that your body reacts to something that bothers it (allergen). This can happen from something that you eat, breathe in, or touch. Allergies often affect the nose, eyes, skin, and stomach. They can be mild, moderate, or very bad (severe). An allergy cannot spread from person to person. They can happen at any age. Sometimes, people outgrow them. What are the causes? Outdoor things, such as pollen, car fumes, and mold. Indoor things, such as dust, smoke, mold, and pets. Foods. Medicines. Things that bother your skin, such as perfume and bug bites. What increases the risk? Having family members with allergies or asthma. What are the signs or symptoms? Symptoms depend on how bad your allergy is. Mild to moderate symptoms Runny nose, stuffy nose, or sneezing. Itchy mouth, ears, or throat. A feeling of mucus dripping down the back of your throat. Sore throat. Eyes that are itchy, red, watery, or puffy. A skin rash, or red, swollen areas of skin (hives). Stomach cramps or bloating. Severe symptoms Very bad allergies to food, medicine, or bug bites may cause a very bad allergy reaction (anaphylaxis). This can be life-threatening. Symptoms include: A red face. Wheezing or coughing. Swollen lips, tongue, or mouth. Tight or swollen throat. Chest pain or tightness, or a fast heartbeat. Trouble breathing or shortness of breath. Pain in your belly (abdomen), vomiting, or watery poop (diarrhea). Feeling dizzy or fainting. How is this treated?     Treatment for this condition depends on your symptoms. Treatment may include: Cold, wet cloths for itching and swelling. Eye drops, nose sprays, or skin creams. Washing out your nose each day. A humidifier. Medicines. A change to the foods you eat. Being exposed again and again to tiny amounts of allergens. This helps your body get used to them. You might have: Allergy shots. Very small amounts of allergen put  under your tongue. An emergency shot (auto-injector pen) if you have a very bad allergy reaction. This is a medicine with a needle. You can put it into your skin by yourself. Your doctor will teach you how to use it. Follow these instructions at home: Medicines  Take or apply over-the-counter and prescription medicines only as told by your doctor. If you are at risk for a very bad allergy reaction, keep an auto-injector pen with you all the time. Eating and drinking Follow instructions from your doctor about what to eat and drink. Drink enough fluid to keep your pee (urine) pale yellow. General instructions If you have ever had a very bad allergy reaction, wear a medical alert bracelet or necklace. Stay away from things that you are allergic to. Keep all follow-up visits as told by your doctor. This is important. Contact a doctor if: Your symptoms do not get better with treatment. Get help right away if: You have symptoms of a very bad allergy reaction. These include: A swollen mouth, tongue, or throat. Pain or tightness in your chest. Trouble breathing. Being short of breath. Dizziness. Fainting. Very bad pain in your belly. Vomiting. Watery poop. These symptoms may be an emergency. Do not wait to see if the symptoms will go away. Get medical help right away. Call your local emergency services (911 in the [place] Do not drive yourself to the hospital. Summary Take or apply over-the-counter and prescription medicines only as told by your doctor. Stay away from things you are allergic to. If you are at risk for a very bad allergy reaction, carry an auto-injector pen all the time.   Wear a medical alert bracelet or necklace. Very bad allergy reactions can be life-threatening. Get help right away. This information is not intended to replace advice given to you by your health care provider. Make sure you discuss any questions you have with your health care provider. Document Revised:  12/25/2018 Document Reviewed: 12/25/2018 Elsevier Patient Education  2023 Elsevier Inc.  

## 2021-11-09 NOTE — Assessment & Plan Note (Signed)
Clinically stable.  No anaphylaxis. Back to normal today. Advised to avoid trigger. Keep Benadryl at home for future occasions. ED precautions given. Advised to contact the office if symptoms return.

## 2021-11-14 ENCOUNTER — Ambulatory Visit (INDEPENDENT_AMBULATORY_CARE_PROVIDER_SITE_OTHER): Payer: Medicare Other

## 2021-11-14 DIAGNOSIS — Z23 Encounter for immunization: Secondary | ICD-10-CM

## 2021-11-14 NOTE — Progress Notes (Signed)
After obtaining consent, and per orders of Dr. Sharlet Salina, injection of Flu given on the left deltoid by Marrian Salvage. Patient tolerated well and instructed to report any adverse reaction to me immediately.

## 2021-11-16 ENCOUNTER — Other Ambulatory Visit: Payer: Self-pay | Admitting: Internal Medicine

## 2021-11-16 ENCOUNTER — Ambulatory Visit (INDEPENDENT_AMBULATORY_CARE_PROVIDER_SITE_OTHER): Payer: Medicare Other | Admitting: Physical Medicine and Rehabilitation

## 2021-11-16 DIAGNOSIS — R202 Paresthesia of skin: Secondary | ICD-10-CM

## 2021-11-16 NOTE — Progress Notes (Signed)
Numeric Pain Rating Scale and Functional Assessment Average Pain 0   In the last MONTH (on 0-10 scale) has pain interfered with the following?  1. General activity like being  able to carry out your everyday physical activities such as walking, climbing stairs, carrying groceries, or moving a chair?  Rating(6) Pain down arm that causes weakness in grip. Its slowly getting better.  +Driver, -BT, -Dye Allergies.

## 2021-11-17 NOTE — Procedures (Signed)
EMG & NCV Findings: Evaluation of the right median motor nerve showed reduced amplitude (2.6 mV).  All remaining nerves (as indicated in the following tables) were within normal limits.    All examined muscles (as indicated in the following table) showed no evidence of electrical instability.    Impression: Essentially NORMAL electrodiagnostic study of the right upper limb.  There is no significant electrodiagnostic evidence of nerve entrapment, brachial plexopathy or cervical radiculopathy.    As you know, purely sensory or demyelinating radiculopathies and chemical radiculitis may not be detected with this particular electrodiagnostic study.  Recommendations: 1.  Follow-up with referring physician. 2.  Continue current management of symptoms.  ___________________________ Laurence Spates FAAPMR Board Certified, American Board of Physical Medicine and Rehabilitation    Nerve Conduction Studies Anti Sensory Summary Table   Stim Site NR Peak (ms) Norm Peak (ms) P-T Amp (V) Norm P-T Amp Site1 Site2 Delta-P (ms) Dist (cm) Vel (m/s) Norm Vel (m/s)  Right Median Acr Palm Anti Sensory (2nd Digit)  32.6C  Wrist    3.5 <3.6 38.4 >10 Wrist Palm 2.7 0.0    Palm    0.8 <2.0 40.2         Right Radial Anti Sensory (Base 1st Digit)  32.8C  Wrist    2.0 <3.1 20.5  Wrist Base 1st Digit 2.0 0.0    Right Ulnar Anti Sensory (5th Digit)  33.1C  Wrist    3.2 <3.7 24.6 >15.0 Wrist 5th Digit 3.2 14.0 44 >38   Motor Summary Table   Stim Site NR Onset (ms) Norm Onset (ms) O-P Amp (mV) Norm O-P Amp Site1 Site2 Delta-0 (ms) Dist (cm) Vel (m/s) Norm Vel (m/s)  Right Median Motor (Abd Poll Brev)  32.7C  Wrist    3.3 <4.2 *2.6 >5 Elbow Wrist 3.7 19.0 51 >50  Elbow    7.0  2.2         Right Ulnar Motor (Abd Dig Min)  32.9C  Wrist    3.0 <4.2 5.2 >3 B Elbow Wrist 3.0 18.0 60 >53  B Elbow    6.0  5.7  A Elbow B Elbow 1.5 10.0 67 >53  A Elbow    7.5  1.7          EMG   Side Muscle Nerve Root Ins Act Fibs  Psw Amp Dur Poly Recrt Int Fraser Din Comment  Right Abd Poll Brev Median C8-T1 Nml Nml Nml Nml Nml 0 Nml Nml   Right 1stDorInt Ulnar C8-T1 Nml Nml Nml Nml Nml 0 Nml Nml   Right PronatorTeres Median C6-7 Nml Nml Nml Nml Nml 0 Nml Nml   Right Biceps Musculocut C5-6 Nml Nml Nml Nml Nml 0 Nml Nml   Right Deltoid Axillary C5-6 Nml Nml Nml Nml Nml 0 Nml Nml     Nerve Conduction Studies Anti Sensory Left/Right Comparison   Stim Site L Lat (ms) R Lat (ms) L-R Lat (ms) L Amp (V) R Amp (V) L-R Amp (%) Site1 Site2 L Vel (m/s) R Vel (m/s) L-R Vel (m/s)  Median Acr Palm Anti Sensory (2nd Digit)  32.6C  Wrist  3.5   38.4  Wrist Palm     Palm  0.8   40.2        Radial Anti Sensory (Base 1st Digit)  32.8C  Wrist  2.0   20.5  Wrist Base 1st Digit     Ulnar Anti Sensory (5th Digit)  33.1C  Wrist  3.2   24.6  Wrist 5th Digit  44    Motor Left/Right Comparison   Stim Site L Lat (ms) R Lat (ms) L-R Lat (ms) L Amp (mV) R Amp (mV) L-R Amp (%) Site1 Site2 L Vel (m/s) R Vel (m/s) L-R Vel (m/s)  Median Motor (Abd Poll Brev)  32.7C  Wrist  3.3   *2.6  Elbow Wrist  51   Elbow  7.0   2.2        Ulnar Motor (Abd Dig Min)  32.9C  Wrist  3.0   5.2  B Elbow Wrist  60   B Elbow  6.0   5.7  A Elbow B Elbow  67   A Elbow  7.5   1.7           Waveforms:

## 2021-11-20 NOTE — Progress Notes (Signed)
April Donaldson - 88 y.o. female MRN [id number]  Date of birth: 1931-03-22  Office Visit Note: Visit Date: 11/16/2021 PCP: Hoyt Koch, MD Referred by: Meredith Pel, MD  Subjective: Chief Complaint  Patient presents with   Right Shoulder - Pain   HPI:  April Donaldson is a 88 y.o. female who comes in today at the request of Dr. Anderson Malta for electrodiagnostic study of the Right upper extremities.  Patient is Right hand dominant.  She has a remote history of left carpal tunnel release now with right shoulder and arm complaints with weakness in the right hand.  Some tingling at times.  Does feel like her symptoms seem to be similar as it was on the left.  No prior electrodiagnostic study to review.   ROS Otherwise per HPI.  Assessment & Plan: Visit Diagnoses:    ICD-10-CM   1. Paresthesia of skin  R20.2 NCV with EMG (electromyography)      Plan: Impression: Essentially NORMAL electrodiagnostic study of the right upper limb.  There is no significant electrodiagnostic evidence of nerve entrapment, brachial plexopathy or cervical radiculopathy.    As you know, purely sensory or demyelinating radiculopathies and chemical radiculitis may not be detected with this particular electrodiagnostic study.  Recommendations: 1.  Follow-up with referring physician. 2.  Continue current management of symptoms.  Meds & Orders: No orders of the defined types were placed in this encounter.   Orders Placed This Encounter  Procedures   NCV with EMG (electromyography)    Follow-up: Return for  G. Alphonzo Severance, MD.   Procedures: No procedures performed  EMG & NCV Findings: Evaluation of the right median motor nerve showed reduced amplitude (2.6 mV).  All remaining nerves (as indicated in the following tables) were within normal limits.    All examined muscles (as indicated in the following table) showed no evidence of electrical instability.    Impression: Essentially  NORMAL electrodiagnostic study of the right upper limb.  There is no significant electrodiagnostic evidence of nerve entrapment, brachial plexopathy or cervical radiculopathy.    As you know, purely sensory or demyelinating radiculopathies and chemical radiculitis may not be detected with this particular electrodiagnostic study.  Recommendations: 1.  Follow-up with referring physician. 2.  Continue current management of symptoms.  ___________________________ Laurence Spates FAAPMR Board Certified, American Board of Physical Medicine and Rehabilitation    Nerve Conduction Studies Anti Sensory Summary Table   Stim Site NR Peak (ms) Norm Peak (ms) P-T Amp (V) Norm P-T Amp Site1 Site2 Delta-P (ms) Dist (cm) Vel (m/s) Norm Vel (m/s)  Right Median Acr Palm Anti Sensory (2nd Digit)  32.6C  Wrist    3.5 <3.6 38.4 >10 Wrist Palm 2.7 0.0    Palm    0.8 <2.0 40.2         Right Radial Anti Sensory (Base 1st Digit)  32.8C  Wrist    2.0 <3.1 20.5  Wrist Base 1st Digit 2.0 0.0    Right Ulnar Anti Sensory (5th Digit)  33.1C  Wrist    3.2 <3.7 24.6 >15.0 Wrist 5th Digit 3.2 14.0 44 >38   Motor Summary Table   Stim Site NR Onset (ms) Norm Onset (ms) O-P Amp (mV) Norm O-P Amp Site1 Site2 Delta-0 (ms) Dist (cm) Vel (m/s) Norm Vel (m/s)  Right Median Motor (Abd Poll Brev)  32.7C  Wrist    3.3 <4.2 *2.6 >5 Elbow Wrist 3.7 19.0 51 >50  Elbow  7.0  2.2         Right Ulnar Motor (Abd Dig Min)  32.9C  Wrist    3.0 <4.2 5.2 >3 B Elbow Wrist 3.0 18.0 60 >53  B Elbow    6.0  5.7  A Elbow B Elbow 1.5 10.0 67 >53  A Elbow    7.5  1.7          EMG   Side Muscle Nerve Root Ins Act Fibs Psw Amp Dur Poly Recrt Int Fraser Din Comment  Right Abd Poll Brev Median C8-T1 Nml Nml Nml Nml Nml 0 Nml Nml   Right 1stDorInt Ulnar C8-T1 Nml Nml Nml Nml Nml 0 Nml Nml   Right PronatorTeres Median C6-7 Nml Nml Nml Nml Nml 0 Nml Nml   Right Biceps Musculocut C5-6 Nml Nml Nml Nml Nml 0 Nml Nml   Right Deltoid Axillary C5-6 Nml  Nml Nml Nml Nml 0 Nml Nml     Nerve Conduction Studies Anti Sensory Left/Right Comparison   Stim Site L Lat (ms) R Lat (ms) L-R Lat (ms) L Amp (V) R Amp (V) L-R Amp (%) Site1 Site2 L Vel (m/s) R Vel (m/s) L-R Vel (m/s)  Median Acr Palm Anti Sensory (2nd Digit)  32.6C  Wrist  3.5   38.4  Wrist Palm     Palm  0.8   40.2        Radial Anti Sensory (Base 1st Digit)  32.8C  Wrist  2.0   20.5  Wrist Base 1st Digit     Ulnar Anti Sensory (5th Digit)  33.1C  Wrist  3.2   24.6  Wrist 5th Digit  44    Motor Left/Right Comparison   Stim Site L Lat (ms) R Lat (ms) L-R Lat (ms) L Amp (mV) R Amp (mV) L-R Amp (%) Site1 Site2 L Vel (m/s) R Vel (m/s) L-R Vel (m/s)  Median Motor (Abd Poll Brev)  32.7C  Wrist  3.3   *2.6  Elbow Wrist  51   Elbow  7.0   2.2        Ulnar Motor (Abd Dig Min)  32.9C  Wrist  3.0   5.2  B Elbow Wrist  60   B Elbow  6.0   5.7  A Elbow B Elbow  67   A Elbow  7.5   1.7           Waveforms:             Clinical History: No specialty comments available.     Objective:  VS:  HT:    WT:   BMI:     BP:   HR: bpm  TEMP: ( )  RESP:  Physical Exam Musculoskeletal:        General: No swelling, tenderness or deformity.     Comments: Inspection reveals no atrophy of the bilateral APB or FDI or hand intrinsics. There is no swelling, color changes, allodynia or dystrophic changes. There is 5 out of 5 strength in the bilateral wrist extension, finger abduction and long finger flexion. There is intact sensation to light touch in all dermatomal and peripheral nerve distributions. There is a negative Phalen's test bilaterally. There is a negative Hoffmann's test bilaterally.  Skin:    General: Skin is warm and dry.     Findings: No erythema or rash.  Neurological:     General: No focal deficit present.     Mental Status: She is alert and oriented to person,  place, and time.     Motor: No weakness or abnormal muscle tone.     Coordination: Coordination normal.   Psychiatric:        Mood and Affect: Mood normal.        Behavior: Behavior normal.      Imaging: No results found.

## 2021-12-05 ENCOUNTER — Encounter: Payer: Self-pay | Admitting: Internal Medicine

## 2021-12-05 ENCOUNTER — Ambulatory Visit (INDEPENDENT_AMBULATORY_CARE_PROVIDER_SITE_OTHER): Payer: Medicare Other | Admitting: Orthopedic Surgery

## 2021-12-05 ENCOUNTER — Ambulatory Visit (INDEPENDENT_AMBULATORY_CARE_PROVIDER_SITE_OTHER): Payer: Medicare Other | Admitting: Internal Medicine

## 2021-12-05 VITALS — BP 144/80 | HR 58 | Ht 59.0 in | Wt 167.0 lb

## 2021-12-05 DIAGNOSIS — D509 Iron deficiency anemia, unspecified: Secondary | ICD-10-CM

## 2021-12-05 DIAGNOSIS — R29898 Other symptoms and signs involving the musculoskeletal system: Secondary | ICD-10-CM

## 2021-12-05 DIAGNOSIS — M1A00X0 Idiopathic chronic gout, unspecified site, without tophus (tophi): Secondary | ICD-10-CM | POA: Diagnosis not present

## 2021-12-05 DIAGNOSIS — B372 Candidiasis of skin and nail: Secondary | ICD-10-CM

## 2021-12-05 DIAGNOSIS — I10 Essential (primary) hypertension: Secondary | ICD-10-CM | POA: Diagnosis not present

## 2021-12-05 LAB — CBC WITH DIFFERENTIAL/PLATELET
Basophils Absolute: 0.1 10*3/uL (ref 0.0–0.1)
Basophils Relative: 1.1 % (ref 0.0–3.0)
Eosinophils Absolute: 0.1 10*3/uL (ref 0.0–0.7)
Eosinophils Relative: 0.9 % (ref 0.0–5.0)
HCT: 34 % — ABNORMAL LOW (ref 36.0–46.0)
Hemoglobin: 11 g/dL — ABNORMAL LOW (ref 12.0–15.0)
Lymphocytes Relative: 27 % (ref 12.0–46.0)
Lymphs Abs: 3 10*3/uL (ref 0.7–4.0)
MCHC: 32.3 g/dL (ref 30.0–36.0)
MCV: 84 fl (ref 78.0–100.0)
Monocytes Absolute: 1 10*3/uL (ref 0.1–1.0)
Monocytes Relative: 9 % (ref 3.0–12.0)
Neutro Abs: 6.9 10*3/uL (ref 1.4–7.7)
Neutrophils Relative %: 62 % (ref 43.0–77.0)
Platelets: 438 10*3/uL — ABNORMAL HIGH (ref 150.0–400.0)
RBC: 4.05 Mil/uL (ref 3.87–5.11)
RDW: 15.8 % — ABNORMAL HIGH (ref 11.5–15.5)
WBC: 11.1 10*3/uL — ABNORMAL HIGH (ref 4.0–10.5)

## 2021-12-05 MED ORDER — NYSTATIN-TRIAMCINOLONE 100000-0.1 UNIT/GM-% EX OINT: 1.0000 | TOPICAL_OINTMENT | Freq: Two times a day (BID) | CUTANEOUS | 3 refills | Status: DC

## 2021-12-05 NOTE — Assessment & Plan Note (Signed)
Has been taking iron for a few weeks. No reported bleeding or dark stools. Checking CBC with diff and adjust as needed.

## 2021-12-05 NOTE — Assessment & Plan Note (Signed)
Mildly high today but typically at goal at home. She has had multiple intolerances to meds. Will continue atenolol 100 mg daily and lisinopril 15 mg daily and hctz 25 mg daily. Recent labs stable.

## 2021-12-05 NOTE — Progress Notes (Signed)
   Subjective:   Patient ID: April Donaldson, female    DOB: 07-16-1931, 88 y.o.   MRN: [id number]  HPI The patient is an 88 YO female coming in for acute visit.   Review of Systems  Constitutional: Negative.   HENT: Negative.    Eyes: Negative.   Respiratory:  Negative for cough, chest tightness and shortness of breath.   Cardiovascular:  Negative for chest pain, palpitations and leg swelling.  Gastrointestinal:  Negative for abdominal distention, abdominal pain, constipation, diarrhea, nausea and vomiting.  Musculoskeletal:  Positive for arthralgias and myalgias.  Skin: Negative.   Neurological: Negative.   Psychiatric/Behavioral: Negative.      Objective:  Physical Exam Constitutional:      Appearance: She is well-developed.  HENT:     Head: Normocephalic and atraumatic.  Cardiovascular:     Rate and Rhythm: Normal rate and regular rhythm.  Pulmonary:     Effort: Pulmonary effort is normal. No respiratory distress.     Breath sounds: Normal breath sounds. No wheezing or rales.  Abdominal:     General: Bowel sounds are normal. There is no distension.     Palpations: Abdomen is soft.     Tenderness: There is no abdominal tenderness. There is no rebound.  Musculoskeletal:     Cervical back: Normal range of motion.  Skin:    General: Skin is warm and dry.  Neurological:     Mental Status: She is alert and oriented to person, place, and time.     Coordination: Coordination normal.     Vitals:   12/05/21 1102 12/05/21 1108  BP: (!) 146/84 (!) 144/80  Pulse: (!) 58   SpO2: 98%   Weight: 167 lb (75.8 kg)   Height: '4\' 11"'$  (1.499 m)     Assessment & Plan:

## 2021-12-05 NOTE — Patient Instructions (Addendum)
We have sent in nystatin cream to use twice a day on the skin.  We will check the labs today.

## 2021-12-05 NOTE — Assessment & Plan Note (Signed)
Rx nystatin triamcinolone cream/ointment to use on the skin. She is advised to avoid scratching and she uses corn starch once healed for moisture.

## 2021-12-05 NOTE — Assessment & Plan Note (Signed)
We discussed that hctz can contribute to uric acid levels but since she is taking allopurinol that we would continue given her intolerances to BP meds. She had been taking this every other day and since moving back to daily no flares.

## 2021-12-08 ENCOUNTER — Ambulatory Visit (INDEPENDENT_AMBULATORY_CARE_PROVIDER_SITE_OTHER): Payer: Medicare Other | Admitting: Rehabilitative and Restorative Service Providers"

## 2021-12-08 ENCOUNTER — Encounter: Payer: Self-pay | Admitting: Rehabilitative and Restorative Service Providers"

## 2021-12-08 ENCOUNTER — Other Ambulatory Visit: Payer: Self-pay

## 2021-12-08 DIAGNOSIS — M25632 Stiffness of left wrist, not elsewhere classified: Secondary | ICD-10-CM

## 2021-12-08 DIAGNOSIS — M25631 Stiffness of right wrist, not elsewhere classified: Secondary | ICD-10-CM

## 2021-12-08 DIAGNOSIS — M25531 Pain in right wrist: Secondary | ICD-10-CM

## 2021-12-08 DIAGNOSIS — M25611 Stiffness of right shoulder, not elsewhere classified: Secondary | ICD-10-CM | POA: Diagnosis not present

## 2021-12-08 DIAGNOSIS — M25612 Stiffness of left shoulder, not elsewhere classified: Secondary | ICD-10-CM

## 2021-12-08 DIAGNOSIS — M25512 Pain in left shoulder: Secondary | ICD-10-CM | POA: Diagnosis not present

## 2021-12-08 DIAGNOSIS — M6281 Muscle weakness (generalized): Secondary | ICD-10-CM

## 2021-12-08 DIAGNOSIS — R278 Other lack of coordination: Secondary | ICD-10-CM

## 2021-12-08 NOTE — Therapy (Signed)
OUTPATIENT OCCUPATIONAL THERAPY ORTHO EVALUATION  Patient Name: April Donaldson MRN: [id number] DOB:1931/12/13, 88 y.o., female Today's Date: 12/08/2021  PCP: Pricilla Holm, MD REFERRING PROVIDER: Meredith Pel, MD   OT End of Session - 12/08/21 1443     Visit Number 1    Number of Visits 12    Date for OT Re-Evaluation 01/20/22    Authorization Type [employer] Medicare    Progress Note Due on Visit 10    OT Start Time 1443    OT Stop Time 1527    OT Time Calculation (min) 44 min    Activity Tolerance Patient tolerated treatment well;No increased pain;Patient limited by pain;Patient limited by fatigue    Behavior During Therapy [facility] for tasks assessed/performed             Past Medical History:  Diagnosis Date   Anxiety    Benign neoplasm of kidney, except pelvis    Cellulitis of leg 03/09/2013 hosp    Cerebrovascular disease    Chronic low back pain    Diabetes mellitus without complication ([place]) 9/62/8366   Diastolic CHF ([facility])    Dr Daneen Schick   Difficult intubation    per patient with hand surgery in 2016 at surgical center- lip was pinched and swollen after surgery- patient states dr Burney Gauze never stated a problem with intubation    Diverticulosis    DJD (degenerative joint disease)    Dyspnea    with exertion    GERD (gastroesophageal reflux disease)    no meds   Glaucoma    Gout    H/O hiatal hernia    Hepatitis    hx of years ago    Hx of colonic polyps    Hypercholesteremia    Hypertension    Hypothyroid    IBS (irritable bowel syndrome)    Internal hemorrhoids    Obesity    OSA (obstructive sleep apnea)    NPSG 2009: AHI 20/h. CPAP - follows with [facility]   Renal insufficiency    one kidney small and non funtioning   Tubular adenoma 2000   Venous insufficiency    Past Surgical History:  Procedure Laterality Date   ABDOMINAL HYSTERECTOMY     APPENDECTOMY     CATARACT EXTRACTION, BILATERAL Bilateral 2017   cataracts Bilateral     COLONOSCOPY     CYSTOSCOPY/RETROGRADE/URETEROSCOPY Bilateral 04/09/2017   Procedure: CYSTOSCOPY/RETROGRADE;  Surgeon: Raynelle Bring, MD;  Location: [facility];  Service: Urology;  Laterality: Bilateral;  ONLY NEEDS 30 MIN FOR PROCEDURE   DILATION AND CURETTAGE OF UTERUS     FUNCTIONAL ENDOSCOPIC SINUS SURGERY  05/2003   Dr. Ernesto Rutherford   hysterctomy and ooperectomy  1970's   MASS EXCISION  2003   lt   MASS EXCISION Left 09/17/2013   Procedure: EXCISION MASS LEFT HAND;  Surgeon: Schuyler Amor, MD;  Location: [place];  Service: Orthopedics;  Laterality: Left;   thyroid lobectomy for a cyst  10/2000   Dr. Ernesto Rutherford   TONSILLECTOMY     Patient Active Problem List   Diagnosis Date Noted   Allergic reaction 11/09/2021   Acquired deformity of lower leg 10/25/2021   Leg swelling 10/25/2021   Anemia 10/25/2021   Bilateral shoulder pain 08/12/2021   Cervical pain (neck) 07/28/2021   Hypokalemia 05/27/2021   Bilateral carotid artery stenosis 03/25/2021   Grade II diastolic dysfunction 29/47/6546   Chest pain 02/11/2021   Dyspnea 50/35/4656   Systolic murmur 81/27/5170   Numbness  and tingling in left hand 06/26/2019   Left shoulder pain 11/28/2018   Fatigue 02/08/2018   Candidal dermatitis 07/21/2016   Bilateral pseudophakia 04/10/2016   Routine general medical examination at a health care facility 06/15/2014   Primary open angle glaucoma of both eyes, mild stage 07/10/2011   Obstructive sleep apnea 11/11/2007   GLAUCOMA 09/22/2007   CKD (chronic kidney disease) stage 3, GFR 30-59 ml/min ([place]) 03/21/2007   Hypothyroidism 01/04/2007   Hyperlipidemia 01/04/2007   GOUT 01/04/2007   Essential hypertension 01/04/2007    ONSET DATE: About June 2023 (~3.5 months)  REFERRING DIAG: R29.898 (ICD-10-CM) - Bilateral arm weakness  THERAPY DIAG:  Stiffness of left shoulder, not elsewhere classified  Stiffness of right shoulder, not elsewhere classified  Stiffness of right  wrist, not elsewhere classified  Stiffness of left wrist, not elsewhere classified  Other lack of coordination  Muscle weakness (generalized)  Pain in right wrist  Acute pain of left shoulder  Rationale for Evaluation and Treatment Rehabilitation  SUBJECTIVE:   SUBJECTIVE STATEMENT: She is is retired, She states some memory issues but that she thinks that in June this year, she started having neck pain and b/l shoulder pain R > L. She states she lost ability to raise her arms, and pain and stiffness seemed to travel down her arms into her hands. She states her two main issues now are painful stiff shoulders and wrist/hands R > L. She states having MRI and EMG and having no significant results.    PERTINENT HISTORY: Per MD orders: "BUE general strengthening. 2x/wk for 6wks." She also has hx of Rt shoulder pain.   PRECAUTIONS: None  WEIGHT BEARING RESTRICTIONS No  PAIN:  Are you having pain? No Rating: 0/10 at rest, up to 6-7/10 in past week at worst when scrubbing her sink  FALLS: Has patient fallen in last 6 months? No  LIVING ENVIRONMENT: Lives with: lives alone and has had folks come over occasionally to help with IADLs, she does drive herself. She states some days she avoids ADLs like dressing due to pain and she could not stand up from her bed at times.    PLOF: Independent  PATIENT GOALS To get both arms and hands, etc., strong and less to do what I need to do.    OBJECTIVE: (All objective assessments below are from initial evaluation on: 12/08/21 unless otherwise specified.)    HAND DOMINANCE: Right   ADLs: Overall ADLs: States decreased ability to grab, hold household objects, pain and inability to open containers, perform FMS tasks (manipulate fasteners on clothing), mild to moderate bathing problems as well.    FUNCTIONAL OUTCOME MEASURES: Eval: Quck DASH 43% impairment today  (Higher % Score  =  More Impairment)     UPPER EXTREMITY ROM     Shoulder to  Wrist AROM Right eval Left eval  Shoulder flexion 132 113  Shoulder abduction 132 91 pain  Shoulder extension    Shoulder internal rotation Rear hip Lateral hip  Shoulder external rotation    Elbow flexion full full  Elbow extension full full  Forearm supination full full  Forearm pronation  full full  Wrist flexion 62 45  Wrist extension 65 68  (Blank rows = not tested)   Hand AROM Right eval Left eval  Full Fist Ability (or Gap to Distal Palmar Crease) full full  Thumb Opposition to Small Finger (or Gap) full full  (Blank rows = not tested)   UPPER EXTREMITY MMT:  Eval:  NT at eval due to pain and time constraints. But mostly 3-/5 MMT observed in b/l UEs   MMT Right TBD Left TBD  Shoulder flexion    Shoulder abduction    Shoulder adduction    Shoulder extension    Shoulder internal rotation    Shoulder external rotation    Middle trapezius    Lower trapezius    Elbow flexion    Elbow extension    Forearm supination    Forearm pronation    Wrist flexion    Wrist extension    Wrist ulnar deviation    Wrist radial deviation    (Blank rows = not tested)  HAND FUNCTION: Eval: Observed weakness in affected hand.  Grip strength Right: 26 lbs, Left: 16 lbs   COORDINATION: Eval: Observed coordination difficulties with both hands and Lt > Rt shoulder.  Box and Blocks Test: TBD Blocks today (TBD is [facility]); 9 Hole Peg Test Right: TBD sec, Left: TBD sec  SENSATION: Eval: Light touch intact today (also states "negative" EMG findings)   EDEMA:   Eval: none significant   COGNITION: Eval: Overall cognitive status: WFL for evaluation today, though mild memory issues   OBSERVATIONS:   Eval: She seemed to have better motion in Rt dom shoulder and arm today, Lt looked like impinging at 90* abd, painful. Both hands seemed to be sluggish motion and poor coordination. She describes something like a learned helplessness caused by pain that kept her in bed a times, resulted  in decreased motion and function with distal UEs, likely exacerbating issues.     TODAY'S TREATMENT:  Post-evaluation treatment: There was not much time for tx today due to lengthy complex eval, but OT did make safety recommendation for bed cane equipment, as she states being "trapped in bed" at one point. Also OT educates on the 3 main points of therapy: compensation/modification of tasks, remediation of stiffness and weakness (will be addressed with HEP), and energy conservation ideas.  She states happy to get started, but that she may have a hard time getting to therapy appointments due to pains at times and other health care appointments.     PATIENT EDUCATION: Education details: See tx section above for details  Person educated: Patient Education method: Verbal Instruction, Teach back, Handouts  Education comprehension: States and demonstrates understanding, Additional Education required    HOME EXERCISE PROGRAM: See tx section above for details    GOALS: Goals reviewed with patient? Yes   SHORT TERM GOALS: (STG required if POC>30 days) Target Date: 12/23/21   Pt will demo/state understanding of initial HEP to improve pain levels and prerequisite motion. Goal status: INITIAL   LONG TERM GOALS: Target Date: 01/20/22  Pt will improve functional ability by decreased impairment per Quick DASH assessment from 43% to 20% or better, for better quality of life. Goal status: INITIAL  2.  Pt will improve grip strength in Lt hand from 16lbs to at least 25#lbs for functional use at home and in IADLs. Goal status: INITIAL  3.  Pt will improve A/ROM in Lt sh abd from 90* painful to at least 125*, to have functional motion for tasks like reach and grasp.  Goal status: INITIAL  4.  Pt will improve strength in b/l shoulder flexion from 3-/5 MMT to at least 4+/5 MMT to have increased functional ability to carry out selfcare and higher-level homecare tasks with no difficulty. Goal status:  INITIAL  5.  Pt will improve coordination skills  in b/l hands, as seen by better score on 9HPT testing from TBD to TBD or better to have increased functional ability to carry out fine motor tasks (fasteners, etc.) and more complex, coordinated IADLs (meal prep, sports, etc.).  Goal status: TBD- will update after next session  6.  Pt will decrease pain at worst from 6-7/10 to 2/10 or better to have better sleep and occupational participation in daily roles. Goal status: INITIAL   ASSESSMENT:  CLINICAL IMPRESSION: Patient is a 88 y.o. female who was seen today for occupational therapy evaluation for b/l UE pain and stiffness from shoulders to hands. She will benefit form OP OT to increase quality of life.    PERFORMANCE DEFICITS in functional skills including ADLs, IADLs, coordination, dexterity, tone, ROM, strength, pain, fascial restrictions, flexibility, FMC, GMC, mobility, balance, body mechanics, endurance, and UE functional use, cognitive skills including emotional, energy/drive, problem solving, and safety awareness, and psychosocial skills including coping strategies, environmental adaptation, habits, and routines and behaviors.   IMPAIRMENTS are limiting patient from ADLs, IADLs, rest and sleep, leisure, and social participation.   COMORBIDITIES has co-morbidities such as anxiety, CHF, DM II, gout, glaucoma, HTN, and others  that affects occupational performance. Patient will benefit from skilled OT to address above impairments and improve overall function.  MODIFICATION OR ASSISTANCE TO COMPLETE EVALUATION: Min-Moderate modification of tasks or assist with assess necessary to complete an evaluation.  OT OCCUPATIONAL PROFILE AND HISTORY: Detailed assessment: Review of records and additional review of physical, cognitive, psychosocial history related to current functional performance.  CLINICAL DECISION MAKING: High - multiple treatment options, significant modification of task  necessary  REHAB POTENTIAL: Good  EVALUATION COMPLEXITY: Moderate      PLAN: OT FREQUENCY: 2x/week  OT DURATION: 6 weeks (through 01/20/22)   PLANNED INTERVENTIONS: self care/ADL training, therapeutic exercise, therapeutic activity, neuromuscular re-education, manual therapy, passive range of motion, splinting, electrical stimulation, ultrasound, fluidotherapy, moist heat, cryotherapy, contrast bath, patient/family education, energy conservation, and coping strategies training  RECOMMENDED OTHER SERVICES: none now   CONSULTED AND AGREED WITH PLAN OF CARE: Patient  PLAN FOR NEXT SESSION: Give shoulder and wrist stretches for HEP, attempt fnl activities (B&B, 9HPT) to improve motion, coordination, strength.  Try to help her find better sleep postures.    Benito Mccreedy, OTR/L, CHT 12/08/2021, 5:58 PM

## 2021-12-11 ENCOUNTER — Encounter: Payer: Self-pay | Admitting: Orthopedic Surgery

## 2021-12-11 NOTE — Progress Notes (Signed)
Office Visit Note   Patient: April Donaldson           Date of Birth: 1931-04-03           MRN: [id number] Visit Date: 12/05/2021 Requested by: Hoyt Koch, MD [address] [place],  [place] [address] PCP: Hoyt Koch, MD  Subjective: Chief Complaint  Patient presents with   Other     Review EMG/NCV    HPI: April Donaldson is a 88 y.o. female who presents to the office reporting right arm weakness.  She really cannot open anything.  Hard for her to unscrew cans.  Shoulder is better.  Hard for her to do her work at home.  Since she was last seen she had an EMG nerve study which was normal..                ROS: All systems reviewed are negative as they relate to the chief complaint within the history of present illness.  Patient denies fevers or chills.  Assessment & Plan: Visit Diagnoses:  1. Bilateral arm weakness     Plan: Impression is generalized weakness bilateral upper extremities with some hand weakness.  No nerve compression by EMG nerve study.  Plan at this time is Occupational Therapy upstairs 2 times a week for 6 weeks for bilateral upper extremity general strengthening.  8-week return on Friday afternoon for clinical recheck.  Follow-Up Instructions: No follow-ups on file.   Orders:  Orders Placed This Encounter  Procedures   Ambulatory referral to Occupational Therapy   No orders of the defined types were placed in this encounter.     Procedures: No procedures performed   Clinical Data: No additional findings.  Objective: Vital Signs: There were no vitals taken for this visit.  Physical Exam:  Constitutional: Patient appears well-developed HEENT:  Head: Normocephalic Eyes:EOM are normal Neck: Normal range of motion Cardiovascular: Normal rate Pulmonary/chest: Effort normal Neurologic: Patient is alert Skin: Skin is warm Psychiatric: Patient has normal mood and affect  Ortho Exam: Ortho exam demonstrates good cervical  spine range of motion.  5 out of 5 grip EPL FPL interosseous wrist flexion extension biceps triceps and deltoid strength.  No paresthesias C5-T1.  Radial pulse intact bilaterally.  Has very good range of motion wrist shoulders elbows with no real coarse grinding or crepitus in the shoulder.  No other masses lymphadenopathy or skin changes noted in the bilateral upper extremity regions.  Specialty Comments:  No specialty comments available.  Imaging: No results found.   PMFS History: Patient Active Problem List   Diagnosis Date Noted   Allergic reaction 11/09/2021   Acquired deformity of lower leg 10/25/2021   Leg swelling 10/25/2021   Anemia 10/25/2021   Bilateral shoulder pain 08/12/2021   Cervical pain (neck) 07/28/2021   Hypokalemia 05/27/2021   Bilateral carotid artery stenosis 03/25/2021   Grade II diastolic dysfunction 70/17/7939   Chest pain 02/11/2021   Dyspnea 03/00/9233   Systolic murmur 00/76/2263   Numbness and tingling in left hand 06/26/2019   Left shoulder pain 11/28/2018   Fatigue 02/08/2018   Candidal dermatitis 07/21/2016   Bilateral pseudophakia 04/10/2016   Routine general medical examination at a health care facility 06/15/2014   Primary open angle glaucoma of both eyes, mild stage 07/10/2011   Obstructive sleep apnea 11/11/2007   GLAUCOMA 09/22/2007   CKD (chronic kidney disease) stage 3, GFR 30-59 ml/min ([place]) 03/21/2007   Hypothyroidism 01/04/2007   Hyperlipidemia 01/04/2007  GOUT 01/04/2007   Essential hypertension 01/04/2007   Past Medical History:  Diagnosis Date   Anxiety    Benign neoplasm of kidney, except pelvis    Cellulitis of leg 03/09/2013 hosp    Cerebrovascular disease    Chronic low back pain    Diabetes mellitus without complication ([place]) 1/95/0932   Diastolic CHF (HCC)    Dr Daneen Schick   Difficult intubation    per patient with hand surgery in 2016 at surgical center- lip was pinched and swollen after surgery- patient states  dr Burney Gauze never stated a problem with intubation    Diverticulosis    DJD (degenerative joint disease)    Dyspnea    with exertion    GERD (gastroesophageal reflux disease)    no meds   Glaucoma    Gout    H/O hiatal hernia    Hepatitis    hx of years ago    Hx of colonic polyps    Hypercholesteremia    Hypertension    Hypothyroid    IBS (irritable bowel syndrome)    Internal hemorrhoids    Obesity    OSA (obstructive sleep apnea)    NPSG 2009: AHI 20/h. CPAP - follows with [facility]   Renal insufficiency    one kidney small and non funtioning   Tubular adenoma 2000   Venous insufficiency     Family History  Problem Relation Age of Onset   Heart disease Mother    Diabetes Mother    Congestive Heart Failure Mother    Asthma Brother    Throat cancer Brother 2   Asthma Sister    Brain cancer Sister 87   Breast cancer Sister 78   Colon cancer Neg Hx    Esophageal cancer Neg Hx    Stomach cancer Neg Hx    Pancreatic cancer Neg Hx     Past Surgical History:  Procedure Laterality Date   ABDOMINAL HYSTERECTOMY     APPENDECTOMY     CATARACT EXTRACTION, BILATERAL Bilateral 2017   cataracts Bilateral    COLONOSCOPY     CYSTOSCOPY/RETROGRADE/URETEROSCOPY Bilateral 04/09/2017   Procedure: CYSTOSCOPY/RETROGRADE;  Surgeon: Raynelle Bring, MD;  Location: [facility];  Service: Urology;  Laterality: Bilateral;  ONLY NEEDS 30 MIN FOR PROCEDURE   DILATION AND CURETTAGE OF UTERUS     FUNCTIONAL ENDOSCOPIC SINUS SURGERY  05/2003   Dr. Ernesto Rutherford   hysterctomy and ooperectomy  1970's   MASS EXCISION  2003   lt   MASS EXCISION Left 09/17/2013   Procedure: EXCISION MASS LEFT HAND;  Surgeon: Schuyler Amor, MD;  Location: [facility];  Service: Orthopedics;  Laterality: Left;   thyroid lobectomy for a cyst  10/2000   Dr. Ernesto Rutherford   TONSILLECTOMY     Social History   Occupational History   Occupation: retired from worked in Engineer, manufacturing systems: RETIRED  Tobacco Use    Smoking status: Never   Smokeless tobacco: Never  Vaping Use   Vaping Use: Never used  Substance and Sexual Activity   Alcohol use: No    Alcohol/week: 0.0 standard drinks of alcohol   Drug use: No   Sexual activity: Not Currently

## 2021-12-13 ENCOUNTER — Ambulatory Visit (INDEPENDENT_AMBULATORY_CARE_PROVIDER_SITE_OTHER): Payer: Medicare Other | Admitting: Rehabilitative and Restorative Service Providers"

## 2021-12-13 ENCOUNTER — Encounter: Payer: Self-pay | Admitting: Rehabilitative and Restorative Service Providers"

## 2021-12-13 DIAGNOSIS — M6281 Muscle weakness (generalized): Secondary | ICD-10-CM

## 2021-12-13 DIAGNOSIS — R278 Other lack of coordination: Secondary | ICD-10-CM

## 2021-12-13 DIAGNOSIS — M25531 Pain in right wrist: Secondary | ICD-10-CM

## 2021-12-13 DIAGNOSIS — M25612 Stiffness of left shoulder, not elsewhere classified: Secondary | ICD-10-CM | POA: Diagnosis not present

## 2021-12-13 DIAGNOSIS — M25631 Stiffness of right wrist, not elsewhere classified: Secondary | ICD-10-CM | POA: Diagnosis not present

## 2021-12-13 DIAGNOSIS — M25611 Stiffness of right shoulder, not elsewhere classified: Secondary | ICD-10-CM

## 2021-12-13 DIAGNOSIS — M25632 Stiffness of left wrist, not elsewhere classified: Secondary | ICD-10-CM

## 2021-12-13 DIAGNOSIS — M25512 Pain in left shoulder: Secondary | ICD-10-CM

## 2021-12-13 NOTE — Therapy (Signed)
OUTPATIENT OCCUPATIONAL THERAPY TREATMENT NOTE  Patient Name: April Donaldson MRN: [id number] DOB:August 14, 1931, 88 y.o., female Today's Date: 12/13/2021  PCP: Pricilla Holm, MD REFERRING PROVIDER: Meredith Pel, MD   OT End of Session - 12/13/21 1154     Visit Number 2    Number of Visits 12    Date for OT Re-Evaluation 01/20/22    Authorization Type [employer] Medicare    Progress Note Due on Visit 10    OT Start Time 1154    OT Stop Time 1234    OT Time Calculation (min) 40 min    Activity Tolerance Patient tolerated treatment well;No increased pain;Patient limited by pain;Patient limited by fatigue    Behavior During Therapy [facility] for tasks assessed/performed             Past Medical History:  Diagnosis Date   Anxiety    Benign neoplasm of kidney, except pelvis    Cellulitis of leg 03/09/2013 hosp    Cerebrovascular disease    Chronic low back pain    Diabetes mellitus without complication ([place]) 5/85/2778   Diastolic CHF (HCC)    Dr Daneen Schick   Difficult intubation    per patient with hand surgery in 2016 at surgical center- lip was pinched and swollen after surgery- patient states dr Burney Gauze never stated a problem with intubation    Diverticulosis    DJD (degenerative joint disease)    Dyspnea    with exertion    GERD (gastroesophageal reflux disease)    no meds   Glaucoma    Gout    H/O hiatal hernia    Hepatitis    hx of years ago    Hx of colonic polyps    Hypercholesteremia    Hypertension    Hypothyroid    IBS (irritable bowel syndrome)    Internal hemorrhoids    Obesity    OSA (obstructive sleep apnea)    NPSG 2009: AHI 20/h. CPAP - follows with [facility]   Renal insufficiency    one kidney small and non funtioning   Tubular adenoma 2000   Venous insufficiency    Past Surgical History:  Procedure Laterality Date   ABDOMINAL HYSTERECTOMY     APPENDECTOMY     CATARACT EXTRACTION, BILATERAL Bilateral 2017   cataracts Bilateral     COLONOSCOPY     CYSTOSCOPY/RETROGRADE/URETEROSCOPY Bilateral 04/09/2017   Procedure: CYSTOSCOPY/RETROGRADE;  Surgeon: Raynelle Bring, MD;  Location: [facility];  Service: Urology;  Laterality: Bilateral;  ONLY NEEDS 30 MIN FOR PROCEDURE   DILATION AND CURETTAGE OF UTERUS     FUNCTIONAL ENDOSCOPIC SINUS SURGERY  05/2003   Dr. Ernesto Rutherford   hysterctomy and ooperectomy  1970's   MASS EXCISION  2003   lt   MASS EXCISION Left 09/17/2013   Procedure: EXCISION MASS LEFT HAND;  Surgeon: Schuyler Amor, MD;  Location: [place];  Service: Orthopedics;  Laterality: Left;   thyroid lobectomy for a cyst  10/2000   Dr. Ernesto Rutherford   TONSILLECTOMY     Patient Active Problem List   Diagnosis Date Noted   Allergic reaction 11/09/2021   Acquired deformity of lower leg 10/25/2021   Leg swelling 10/25/2021   Anemia 10/25/2021   Bilateral shoulder pain 08/12/2021   Cervical pain (neck) 07/28/2021   Hypokalemia 05/27/2021   Bilateral carotid artery stenosis 03/25/2021   Grade II diastolic dysfunction 24/23/5361   Chest pain 02/11/2021   Dyspnea 44/31/5400   Systolic murmur 86/76/1950   Numbness  and tingling in left hand 06/26/2019   Left shoulder pain 11/28/2018   Fatigue 02/08/2018   Candidal dermatitis 07/21/2016   Bilateral pseudophakia 04/10/2016   Routine general medical examination at a health care facility 06/15/2014   Primary open angle glaucoma of both eyes, mild stage 07/10/2011   Obstructive sleep apnea 11/11/2007   GLAUCOMA 09/22/2007   CKD (chronic kidney disease) stage 3, GFR 30-59 ml/min ([place]) 03/21/2007   Hypothyroidism 01/04/2007   Hyperlipidemia 01/04/2007   GOUT 01/04/2007   Essential hypertension 01/04/2007    ONSET DATE: About June 2023 (~3.5 months)  REFERRING DIAG: R29.898 (ICD-10-CM) - Bilateral arm weakness  THERAPY DIAG:  Stiffness of left shoulder, not elsewhere classified  Stiffness of right shoulder, not elsewhere classified  Stiffness of right  wrist, not elsewhere classified  Muscle weakness (generalized)  Other lack of coordination  Stiffness of left wrist, not elsewhere classified  Pain in right wrist  Acute pain of left shoulder  Rationale for Evaluation and Treatment Rehabilitation  PERTINENT HISTORY: Per MD orders: "BUE general strengthening. 2x/wk for 6wks." She also has hx of Rt shoulder pain.  From eval: "She is is retired, She states some memory issues but that she thinks that in June this year, she started having neck pain and b/l shoulder pain R > L. She states she lost ability to raise her arms, and pain and stiffness seemed to travel down her arms into her hands. She states her two main issues now are painful stiff shoulders and wrist/hands R > L. She states having MRI and EMG and having no significant results. "  PRECAUTIONS: None  WEIGHT BEARING RESTRICTIONS No   SUBJECTIVE:   SUBJECTIVE STATEMENT: She states feeling stiff this morning and often having a busy schedule with doctor's appointments and things to do around her home.    PAIN:  Are you having pain? Yes Rating: 2-3/10 aching pain at rest, wrists and shoulders today   OBJECTIVE: (All objective assessments below are from initial evaluation on: 12/08/21 unless otherwise specified.)    HAND DOMINANCE: Right   ADLs: Overall ADLs: States decreased ability to grab, hold household objects, pain and inability to open containers, perform FMS tasks (manipulate fasteners on clothing), mild to moderate bathing problems as well.    FUNCTIONAL OUTCOME MEASURES: Eval: Quck DASH 43% impairment today  (Higher % Score  =  More Impairment)     UPPER EXTREMITY ROM     Shoulder to Wrist AROM Right eval Left eval  Shoulder flexion 132 113  Shoulder abduction 132 91 pain  Shoulder extension    Shoulder internal rotation Rear hip Lateral hip  Shoulder external rotation    Elbow flexion full full  Elbow extension full full  Forearm supination full  full  Forearm pronation  full full  Wrist flexion 62 45  Wrist extension 65 68  (Blank rows = not tested)   Hand AROM Right eval Left eval  Full Fist Ability (or Gap to Distal Palmar Crease) full full  Thumb Opposition to Small Finger (or Gap) full full  (Blank rows = not tested)   UPPER EXTREMITY MMT:    Eval:  NT at eval due to pain and time constraints. But mostly 3-/5 MMT observed in b/l UEs   MMT Right TBD Left TBD  Shoulder flexion    Shoulder abduction    Shoulder adduction    Shoulder extension    Shoulder internal rotation    Shoulder external rotation    Middle trapezius  Lower trapezius    Elbow flexion    Elbow extension    Forearm supination    Forearm pronation    Wrist flexion    Wrist extension    Wrist ulnar deviation    Wrist radial deviation    (Blank rows = not tested)  HAND FUNCTION: Eval: Observed weakness in affected hand.  Grip strength Right: 26 lbs, Left: 16 lbs   COORDINATION: Eval: Observed coordination difficulties with both hands and Lt > Rt shoulder.  Box and Blocks Test: TBD Blocks today (TBD is [facility]); 9 Hole Peg Test Right: TBD sec, Left: TBD sec  SENSATION: Eval: Light touch intact today (also states "negative" EMG findings)   EDEMA:   Eval: none significant   COGNITION: Eval: Overall cognitive status: WFL for evaluation today, though mild memory issues   OBSERVATIONS:   Eval: She seemed to have better motion in Rt dom shoulder and arm today, Lt looked like impinging at 90* abd, painful. Both hands seemed to be sluggish motion and poor coordination. She describes something like a learned helplessness caused by pain that kept her in bed a times, resulted in decreased motion and function with distal UEs, likely exacerbating issues.     TODAY'S TREATMENT:  12/13/21: OT gives comprehensive education on initial HEP including the below scapular, shoulder, and wrist stretches to combat her tightness. She is on MH while OT  explains them, which she states feels great.  She is then lead through each, slowly, carefully and has no significant pains with any of them. Shoulder abduction and IR is the hardest.  We are working on restoring motion for functional ability. She is encouraged to do 2-4 x day, and she states "being so busy" it will be hard to do more than once or twice. OT may need to work on organizing daily routines with her.   Exercises - Seated Scapular Retraction  - 2- 4 x daily - 5-10 reps - 5 seconds hold - Seated Shoulder Flexion Towel Slide at Table Top  - 4 x daily - 3-5 reps - 15 hold - Seated Shoulder Abduction Towel Slide at Table Top  - 3-4 x daily - 3-5 reps - 15 hold - Seated Shoulder External Rotation PROM on Table  - 3-4 x daily - 3-5 reps - 15 sec hold - Standing Shoulder Internal Rotation Stretch Behind Back  - 4-6 x daily - 3-5 reps - 15 sec hold - Wrist Flexion Stretch  - 4 x daily - 3-5 reps - 15 sec hold - Wrist Prayer Stretch  - 4 x daily - 3-5 reps - 15 sec hold  PATIENT EDUCATION: Education details: See tx section above for details  Person educated: Patient Education method: Verbal Instruction, Teach back, Handouts  Education comprehension: States and demonstrates understanding, Additional Education required    HOME EXERCISE PROGRAM: Access Code: [email or web address] URL: [email or web address] Date: 12/13/2021 Prepared by: Benito Mccreedy   GOALS: Goals reviewed with patient? Yes   SHORT TERM GOALS: (STG required if POC>30 days) Target Date: 12/23/21   Pt will demo/state understanding of initial HEP to improve pain levels and prerequisite motion. Goal status: Progressing   LONG TERM GOALS: Target Date: 01/20/22  Pt will improve functional ability by decreased impairment per Quick DASH assessment from 43% to 20% or better, for better quality of life. Goal status: INITIAL  2.  Pt will improve grip strength in Lt hand from 16lbs to at least 25#lbs for functional  use at home and in  IADLs. Goal status: INITIAL  3.  Pt will improve A/ROM in Lt sh abd from 90* painful to at least 125*, to have functional motion for tasks like reach and grasp.  Goal status: INITIAL  4.  Pt will improve strength in b/l shoulder flexion from 3-/5 MMT to at least 4+/5 MMT to have increased functional ability to carry out selfcare and higher-level homecare tasks with no difficulty. Goal status: INITIAL  5.  Pt will improve coordination skills in b/l hands, as seen by better score on 9HPT testing from TBD to TBD or better to have increased functional ability to carry out fine motor tasks (fasteners, etc.) and more complex, coordinated IADLs (meal prep, sports, etc.).  Goal status: TBD- will update after next session  6.  Pt will decrease pain at worst from 6-7/10 to 2/10 or better to have better sleep and occupational participation in daily roles. Goal status: INITIAL   ASSESSMENT:  CLINICAL IMPRESSION: 12/13/21: She is learning management of tightness and beginning HEP. We may also need to look at energy conservation and organizing daily routines to have rest and self-care times.    Eval: Patient is a 88 y.o. female who was seen today for occupational therapy evaluation for b/l UE pain and stiffness from shoulders to hands. She will benefit form OP OT to increase quality of life.     PLAN: OT FREQUENCY: 2x/week  OT DURATION: 6 weeks (through 01/20/22)   PLANNED INTERVENTIONS: self care/ADL training, therapeutic exercise, therapeutic activity, neuromuscular re-education, manual therapy, passive range of motion, splinting, electrical stimulation, ultrasound, fluidotherapy, moist heat, cryotherapy, contrast bath, patient/family education, energy conservation, and coping strategies training  RECOMMENDED OTHER SERVICES: none now   CONSULTED AND AGREED WITH PLAN OF CARE: Patient  PLAN FOR NEXT SESSION:  Go over initial HEP,  once well understood and tolerated, add  AAROM with wand and hand strengthening. Attempt fnl activities (B&B, 9HPT) to improve motion, coordination, strength.  Try to help her find better sleep postures as well.    Benito Mccreedy, OTR/L, CHT 12/13/2021, 4:03 PM

## 2021-12-14 NOTE — Therapy (Signed)
OUTPATIENT OCCUPATIONAL THERAPY TREATMENT NOTE  Patient Name: April Donaldson MRN: [id number] DOB:[birth year], 88 y.o., female Today's Date: 12/15/2021  PCP: Pricilla Holm, MD REFERRING PROVIDER: Meredith Pel, MD   OT End of Session - 12/15/21 1154     Visit Number 3    Number of Visits 12    Date for OT Re-Evaluation 01/20/22    Authorization Type [employer] Medicare    Progress Note Due on Visit 10    OT Start Time 1154    OT Stop Time 1230    OT Time Calculation (min) 36 min    Activity Tolerance Patient tolerated treatment well;No increased pain;Patient limited by pain;Patient limited by fatigue    Behavior During Therapy [facility] for tasks assessed/performed              Past Medical History:  Diagnosis Date   Anxiety    Benign neoplasm of kidney, except pelvis    Cellulitis of leg 03/09/2013 hosp    Cerebrovascular disease    Chronic low back pain    Diabetes mellitus without complication ([place]) 9/67/5916   Diastolic CHF (HCC)    Dr Daneen Schick   Difficult intubation    per patient with hand surgery in 2016 at surgical center- lip was pinched and swollen after surgery- patient states dr Burney Gauze never stated a problem with intubation    Diverticulosis    DJD (degenerative joint disease)    Dyspnea    with exertion    GERD (gastroesophageal reflux disease)    no meds   Glaucoma    Gout    H/O hiatal hernia    Hepatitis    hx of years ago    Hx of colonic polyps    Hypercholesteremia    Hypertension    Hypothyroid    IBS (irritable bowel syndrome)    Internal hemorrhoids    Obesity    OSA (obstructive sleep apnea)    NPSG 2009: AHI 20/h. CPAP - follows with [facility]   Renal insufficiency    one kidney small and non funtioning   Tubular adenoma 2000   Venous insufficiency    Past Surgical History:  Procedure Laterality Date   ABDOMINAL HYSTERECTOMY     APPENDECTOMY     CATARACT EXTRACTION, BILATERAL Bilateral 2017   cataracts Bilateral     COLONOSCOPY     CYSTOSCOPY/RETROGRADE/URETEROSCOPY Bilateral 04/09/2017   Procedure: CYSTOSCOPY/RETROGRADE;  Surgeon: Raynelle Bring, MD;  Location: [facility];  Service: Urology;  Laterality: Bilateral;  ONLY NEEDS 30 MIN FOR PROCEDURE   DILATION AND CURETTAGE OF UTERUS     FUNCTIONAL ENDOSCOPIC SINUS SURGERY  05/2003   Dr. Ernesto Rutherford   hysterctomy and ooperectomy  1970's   MASS EXCISION  2003   lt   MASS EXCISION Left 09/17/2013   Procedure: EXCISION MASS LEFT HAND;  Surgeon: Schuyler Amor, MD;  Location: [place];  Service: Orthopedics;  Laterality: Left;   thyroid lobectomy for a cyst  10/2000   Dr. Ernesto Rutherford   TONSILLECTOMY     Patient Active Problem List   Diagnosis Date Noted   Allergic reaction 11/09/2021   Acquired deformity of lower leg 10/25/2021   Leg swelling 10/25/2021   Anemia 10/25/2021   Bilateral shoulder pain 08/12/2021   Cervical pain (neck) 07/28/2021   Hypokalemia 05/27/2021   Bilateral carotid artery stenosis 03/25/2021   Grade II diastolic dysfunction 38/46/6599   Chest pain 02/11/2021   Dyspnea 35/70/1779   Systolic murmur 39/04/90  Numbness and tingling in left hand 06/26/2019   Left shoulder pain 11/28/2018   Fatigue 02/08/2018   Candidal dermatitis 07/21/2016   Bilateral pseudophakia 04/10/2016   Routine general medical examination at a health care facility 06/15/2014   Primary open angle glaucoma of both eyes, mild stage 07/10/2011   Obstructive sleep apnea 11/11/2007   GLAUCOMA 09/22/2007   CKD (chronic kidney disease) stage 3, GFR 30-59 ml/min ([place]) 03/21/2007   Hypothyroidism 01/04/2007   Hyperlipidemia 01/04/2007   GOUT 01/04/2007   Essential hypertension 01/04/2007    ONSET DATE: About June 2023 (~3.5 months)  REFERRING DIAG: R29.898 (ICD-10-CM) - Bilateral arm weakness  THERAPY DIAG:  Stiffness of left shoulder, not elsewhere classified  Stiffness of right shoulder, not elsewhere classified  Stiffness of right  wrist, not elsewhere classified  Stiffness of left wrist, not elsewhere classified  Other lack of coordination  Muscle weakness (generalized)  Pain in right wrist  Acute pain of left shoulder  Rationale for Evaluation and Treatment Rehabilitation  PERTINENT HISTORY: Per MD orders: "BUE general strengthening. 2x/wk for 6wks." She also has hx of Rt shoulder pain.  From eval: "She is is retired, She states some memory issues but that she thinks that in June this year, she started having neck pain and b/l shoulder pain R > L. She states she lost ability to raise her arms, and pain and stiffness seemed to travel down her arms into her hands. She states her two main issues now are painful stiff shoulders and wrist/hands R > L. She states having MRI and EMG and having no significant results. "  PRECAUTIONS: None  WEIGHT BEARING RESTRICTIONS No   SUBJECTIVE:   SUBJECTIVE STATEMENT: She states the exercises are going ok at home and she wants to review them for accuracy.    PAIN:  Are you having pain? Yes  Rating: 2-3/10 aching pain at rest, wrists and shoulders today   OBJECTIVE: (All objective assessments below are from initial evaluation on: 12/08/21 unless otherwise specified.)    HAND DOMINANCE: Right   ADLs: Overall ADLs: States decreased ability to grab, hold household objects, pain and inability to open containers, perform FMS tasks (manipulate fasteners on clothing), mild to moderate bathing problems as well.    FUNCTIONAL OUTCOME MEASURES: Eval: Quck DASH 43% impairment today  (Higher % Score  =  More Impairment)     UPPER EXTREMITY ROM     Shoulder to Wrist AROM Right eval Left eval  Shoulder flexion 132 113  Shoulder abduction 132 91 pain  Shoulder extension    Shoulder internal rotation Rear hip Lateral hip  Shoulder external rotation    Elbow flexion full full  Elbow extension full full  Forearm supination full full  Forearm pronation  full full  Wrist  flexion 62 45  Wrist extension 65 68  (Blank rows = not tested)   Hand AROM Right eval Left eval  Full Fist Ability (or Gap to Distal Palmar Crease) full full  Thumb Opposition to Small Finger (or Gap) full full  (Blank rows = not tested)   UPPER EXTREMITY MMT:    Eval:  NT at eval due to pain and time constraints. But mostly 3-/5 MMT observed in b/l UEs   MMT Right TBD Left TBD  Shoulder flexion    Shoulder abduction    Shoulder adduction    Shoulder extension    Shoulder internal rotation    Shoulder external rotation    Middle trapezius  Lower trapezius    Elbow flexion    Elbow extension    Forearm supination    Forearm pronation    Wrist flexion    Wrist extension    Wrist ulnar deviation    Wrist radial deviation    (Blank rows = not tested)  HAND FUNCTION: Eval: Observed weakness in affected hand.  Grip strength Right: 26 lbs, Left: 16 lbs   COORDINATION: Eval: Observed coordination difficulties with both hands and Lt > Rt shoulder.  Box and Blocks Test: TBD Blocks today (TBD is [facility]); 9 Hole Peg Test Right: TBD sec, Left: TBD sec  SENSATION: Eval: Light touch intact today (also states "negative" EMG findings)   EDEMA:   Eval: none significant   COGNITION: Eval: Overall cognitive status: WFL for evaluation today, though mild memory issues   OBSERVATIONS:   Eval: She seemed to have better motion in Rt dom shoulder and arm today, Lt looked like impinging at 90* abd, painful. Both hands seemed to be sluggish motion and poor coordination. She describes something like a learned helplessness caused by pain that kept her in bed a times, resulted in decreased motion and function with distal UEs, likely exacerbating issues.     TODAY'S TREATMENT:  12/15/21: OT reviews her HEP while she is on MH 4 mins, then OT has her perform for consistency and gives a few cues to relax and come into better postures, etc.  She does very well for the 2nd time in session, and  can be upgraded at next visit as tolerated.  Exercises below.   Exercises - Seated Scapular Retraction  - 2- 4 x daily - 5-10 reps - 5 seconds hold - Seated Shoulder Flexion Towel Slide at Table Top  - 4 x daily - 3-5 reps - 15 hold - Seated Shoulder Abduction Towel Slide at Table Top  - 3-4 x daily - 3-5 reps - 15 hold - Seated Shoulder External Rotation PROM on Table  - 3-4 x daily - 3-5 reps - 15 sec hold - Standing Shoulder Internal Rotation Stretch Behind Back  - 4-6 x daily - 3-5 reps - 15 sec hold - Wrist Flexion Stretch  - 4 x daily - 3-5 reps - 15 sec hold - Wrist Prayer Stretch  - 4 x daily - 3-5 reps - 15 sec hold   PATIENT EDUCATION: Education details: See tx section above for details  Person educated: Patient Education method: Verbal Instruction, Teach back, Handouts  Education comprehension: States and demonstrates understanding, Additional Education required    HOME EXERCISE PROGRAM: Access Code: [email or web address] URL: [email or web address] Date: 12/13/2021 Prepared by: Benito Mccreedy   GOALS: Goals reviewed with patient? Yes   SHORT TERM GOALS: (STG required if POC>30 days) Target Date: 12/23/21   Pt will demo/state understanding of initial HEP to improve pain levels and prerequisite motion. Goal status: Progressing   LONG TERM GOALS: Target Date: 01/20/22  Pt will improve functional ability by decreased impairment per Quick DASH assessment from 43% to 20% or better, for better quality of life. Goal status: INITIAL  2.  Pt will improve grip strength in Lt hand from 16lbs to at least 25#lbs for functional use at home and in IADLs. Goal status: INITIAL  3.  Pt will improve A/ROM in Lt sh abd from 90* painful to at least 125*, to have functional motion for tasks like reach and grasp.  Goal status: INITIAL  4.  Pt will improve strength in b/l shoulder flexion from 3-/5  MMT to at least 4+/5 MMT to have increased functional ability to carry out  selfcare and higher-level homecare tasks with no difficulty. Goal status: INITIAL  5.  Pt will improve coordination skills in b/l hands, as seen by better score on 9HPT testing from TBD to TBD or better to have increased functional ability to carry out fine motor tasks (fasteners, etc.) and more complex, coordinated IADLs (meal prep, sports, etc.).  Goal status: TBD- will update after next session  6.  Pt will decrease pain at worst from 6-7/10 to 2/10 or better to have better sleep and occupational participation in daily roles. Goal status: INITIAL   ASSESSMENT:  CLINICAL IMPRESSION: 12/15/21: She is learning HEP well and can be upgraded to A/AROM next session as tolerated.   12/13/21: She is learning management of tightness and beginning HEP. We may also need to look at energy conservation and organizing daily routines to have rest and self-care times.    PLAN: OT FREQUENCY: 2x/week  OT DURATION: 6 weeks (through 01/20/22)   PLANNED INTERVENTIONS: self care/ADL training, therapeutic exercise, therapeutic activity, neuromuscular re-education, manual therapy, passive range of motion, splinting, electrical stimulation, ultrasound, fluidotherapy, moist heat, cryotherapy, contrast bath, patient/family education, energy conservation, and coping strategies training  RECOMMENDED OTHER SERVICES: none now   CONSULTED AND AGREED WITH PLAN OF CARE: Patient  PLAN FOR NEXT SESSION:  Check HEP and pain, add AAROM with wand or in supine as tolerated.  Attempt fnl activities (B&B, 9HPT) to improve motion, coordination, strength.  Try to help her find better sleep postures as well.    Benito Mccreedy, OTR/L, CHT 12/15/2021, 12:48 PM

## 2021-12-15 ENCOUNTER — Encounter: Payer: Self-pay | Admitting: Rehabilitative and Restorative Service Providers"

## 2021-12-15 ENCOUNTER — Ambulatory Visit (INDEPENDENT_AMBULATORY_CARE_PROVIDER_SITE_OTHER): Payer: Medicare Other | Admitting: Rehabilitative and Restorative Service Providers"

## 2021-12-15 DIAGNOSIS — M25632 Stiffness of left wrist, not elsewhere classified: Secondary | ICD-10-CM

## 2021-12-15 DIAGNOSIS — M6281 Muscle weakness (generalized): Secondary | ICD-10-CM

## 2021-12-15 DIAGNOSIS — M25612 Stiffness of left shoulder, not elsewhere classified: Secondary | ICD-10-CM | POA: Diagnosis not present

## 2021-12-15 DIAGNOSIS — M25631 Stiffness of right wrist, not elsewhere classified: Secondary | ICD-10-CM

## 2021-12-15 DIAGNOSIS — M25611 Stiffness of right shoulder, not elsewhere classified: Secondary | ICD-10-CM | POA: Diagnosis not present

## 2021-12-15 DIAGNOSIS — M25512 Pain in left shoulder: Secondary | ICD-10-CM

## 2021-12-15 DIAGNOSIS — R278 Other lack of coordination: Secondary | ICD-10-CM

## 2021-12-15 DIAGNOSIS — M25531 Pain in right wrist: Secondary | ICD-10-CM

## 2021-12-19 NOTE — Therapy (Signed)
OUTPATIENT OCCUPATIONAL THERAPY TREATMENT NOTE  Patient Name: April Donaldson MRN: [id number] DOB:October 01, 1931, 88 y.o., female Today's Date: 12/20/2021  PCP: Pricilla Holm, MD REFERRING PROVIDER: Meredith Pel, MD   OT End of Session - 12/20/21 1155     Visit Number 4    Number of Visits 12    Date for OT Re-Evaluation 01/20/22    Authorization Type [employer] Medicare    Progress Note Due on Visit 10    OT Start Time 1154    OT Stop Time 1236    OT Time Calculation (min) 42 min    Equipment Utilized During Treatment t-putty    Activity Tolerance Patient tolerated treatment well;No increased pain;Patient limited by pain;Patient limited by fatigue    Behavior During Therapy [facility] for tasks assessed/performed             Past Medical History:  Diagnosis Date   Anxiety    Benign neoplasm of kidney, except pelvis    Cellulitis of leg 03/09/2013 hosp    Cerebrovascular disease    Chronic low back pain    Diabetes mellitus without complication ([place]) 1/60/7371   Diastolic CHF (HCC)    Dr Daneen Schick   Difficult intubation    per patient with hand surgery in 2016 at surgical center- lip was pinched and swollen after surgery- patient states dr Burney Gauze never stated a problem with intubation    Diverticulosis    DJD (degenerative joint disease)    Dyspnea    with exertion    GERD (gastroesophageal reflux disease)    no meds   Glaucoma    Gout    H/O hiatal hernia    Hepatitis    hx of years ago    Hx of colonic polyps    Hypercholesteremia    Hypertension    Hypothyroid    IBS (irritable bowel syndrome)    Internal hemorrhoids    Obesity    OSA (obstructive sleep apnea)    NPSG 2009: AHI 20/h. CPAP - follows with [facility]   Renal insufficiency    one kidney small and non funtioning   Tubular adenoma 2000   Venous insufficiency    Past Surgical History:  Procedure Laterality Date   ABDOMINAL HYSTERECTOMY     APPENDECTOMY     CATARACT EXTRACTION, BILATERAL  Bilateral 2017   cataracts Bilateral    COLONOSCOPY     CYSTOSCOPY/RETROGRADE/URETEROSCOPY Bilateral 04/09/2017   Procedure: CYSTOSCOPY/RETROGRADE;  Surgeon: Raynelle Bring, MD;  Location: [facility];  Service: Urology;  Laterality: Bilateral;  ONLY NEEDS 30 MIN FOR PROCEDURE   DILATION AND CURETTAGE OF UTERUS     FUNCTIONAL ENDOSCOPIC SINUS SURGERY  05/2003   Dr. Ernesto Rutherford   hysterctomy and ooperectomy  1970's   MASS EXCISION  2003   lt   MASS EXCISION Left 09/17/2013   Procedure: EXCISION MASS LEFT HAND;  Surgeon: Schuyler Amor, MD;  Location: [place];  Service: Orthopedics;  Laterality: Left;   thyroid lobectomy for a cyst  10/2000   Dr. Ernesto Rutherford   TONSILLECTOMY     Patient Active Problem List   Diagnosis Date Noted   Allergic reaction 11/09/2021   Acquired deformity of lower leg 10/25/2021   Leg swelling 10/25/2021   Anemia 10/25/2021   Bilateral shoulder pain 08/12/2021   Cervical pain (neck) 07/28/2021   Hypokalemia 05/27/2021   Bilateral carotid artery stenosis 03/25/2021   Grade II diastolic dysfunction 08/23/9483   Chest pain 02/11/2021   Dyspnea 02/11/2021  Systolic murmur 72/53/6644   Numbness and tingling in left hand 06/26/2019   Left shoulder pain 11/28/2018   Fatigue 02/08/2018   Candidal dermatitis 07/21/2016   Bilateral pseudophakia 04/10/2016   Routine general medical examination at a health care facility 06/15/2014   Primary open angle glaucoma of both eyes, mild stage 07/10/2011   Obstructive sleep apnea 11/11/2007   GLAUCOMA 09/22/2007   CKD (chronic kidney disease) stage 3, GFR 30-59 ml/min ([place]) 03/21/2007   Hypothyroidism 01/04/2007   Hyperlipidemia 01/04/2007   GOUT 01/04/2007   Essential hypertension 01/04/2007    ONSET DATE: About June 2023 (~3.5 months)  REFERRING DIAG: R29.898 (ICD-10-CM) - Bilateral arm weakness  THERAPY DIAG:  Stiffness of left shoulder, not elsewhere classified  Stiffness of left wrist, not elsewhere  classified  Pain in right wrist  Acute pain of left shoulder  Other lack of coordination  Stiffness of right shoulder, not elsewhere classified  Stiffness of right wrist, not elsewhere classified  Muscle weakness (generalized)  Rationale for Evaluation and Treatment Rehabilitation  PERTINENT HISTORY: Per MD orders: "BUE general strengthening. 2x/wk for 6wks." She also has hx of Rt shoulder pain.  From eval: "She is is retired, She states some memory issues but that she thinks that in June this year, she started having neck pain and b/l shoulder pain R > L. She states she lost ability to raise her arms, and pain and stiffness seemed to travel down her arms into her hands. She states her two main issues now are painful stiff shoulders and wrist/hands R > L. She states having MRI and EMG and having no significant results. "  PRECAUTIONS: None  WEIGHT BEARING RESTRICTIONS No   SUBJECTIVE:   SUBJECTIVE STATEMENT: She states she has pain in her backside and is seeing a doctor for that this week or next week.    PAIN:  Are you having pain? Yes  Rating: 2-3/10 aching pain at rest, wrists and shoulders today   OBJECTIVE: (All objective assessments below are from initial evaluation on: 12/08/21 unless otherwise specified.)    HAND DOMINANCE: Right   ADLs: Overall ADLs: States decreased ability to grab, hold household objects, pain and inability to open containers, perform FMS tasks (manipulate fasteners on clothing), mild to moderate bathing problems as well.    FUNCTIONAL OUTCOME MEASURES: Eval: Quck DASH 43% impairment today  (Higher % Score  =  More Impairment)     UPPER EXTREMITY ROM     Shoulder to Wrist AROM Right eval Left eval  Shoulder flexion 132 113  Shoulder abduction 132 91 pain  Shoulder extension    Shoulder internal rotation Rear hip Lateral hip  Shoulder external rotation    Elbow flexion full full  Elbow extension full full  Forearm supination full  full  Forearm pronation  full full  Wrist flexion 62 45  Wrist extension 65 68  (Blank rows = not tested)   Hand AROM Right eval Left eval  Full Fist Ability (or Gap to Distal Palmar Crease) full full  Thumb Opposition to Small Finger (or Gap) full full  (Blank rows = not tested)   UPPER EXTREMITY MMT:    Eval:  NT at eval due to pain and time constraints. But mostly 3-/5 MMT observed in b/l UEs   MMT Right TBD Left TBD  Shoulder flexion    Shoulder abduction    Shoulder adduction    Shoulder extension    Shoulder internal rotation    Shoulder external rotation  Middle trapezius    Lower trapezius    Elbow flexion    Elbow extension    Forearm supination    Forearm pronation    Wrist flexion    Wrist extension    Wrist ulnar deviation    Wrist radial deviation    (Blank rows = not tested)  HAND FUNCTION: Eval: Observed weakness in affected hand.  Grip strength Right: 26 lbs, Left: 16 lbs   COORDINATION: 12/20/21: 9 Hole Peg Test  Right: 28sec, Left: 26 sec  Eval: Observed coordination difficulties with both hands and Lt > Rt shoulder.    SENSATION: Eval: Light touch intact today (also states "negative" EMG findings)   EDEMA:   Eval: none significant   COGNITION: Eval: Overall cognitive status: WFL for evaluation today, though mild memory issues   OBSERVATIONS:   Eval: She seemed to have better motion in Rt dom shoulder and arm today, Lt looked like impinging at 90* abd, painful. Both hands seemed to be sluggish motion and poor coordination. She describes something like a learned helplessness caused by pain that kept her in bed a times, resulted in decreased motion and function with distal UEs, likely exacerbating issues.     TODAY'S TREATMENT:  12/20/21: She does 9HPT FMS activity and show no significant FMS deficit.  She reviews HEP and instead of sh stretches at table, she does supine today for alternate option.  She then learns new AAROM for b/l sh  strength in seated or standing (as below). Also hand strength with t-putty as below. Again she states "not using" left hand, and OT encourages her to prevent over use to Rt arm and to strengthen Lt: use it! (New exercises below)   Exercises - Standing Shoulder Extension with Dowel  - 4-6 x daily - 1 sets - 10-15 reps - Seated Shoulder Flexion AAROM with Dowel  - 4-6 x daily - 1 sets - 10-15 reps - Standing Shoulder Abduction AAROM with Dowel  - 4-6 x daily - 1 sets - 10-15 reps - Full Fist  - 2-3 x daily - 5 reps - Quick Finger Spreading with Rubber Band  - 4-6 x daily - 1 sets - 10-15 reps - Finger Pinch and Pull with Putty  - 2-3 x daily - 5 reps   PATIENT EDUCATION: Education details: See tx section above for details  Person educated: Patient Education method: Verbal Instruction, Teach back, Handouts  Education comprehension: States and demonstrates understanding, Additional Education required    HOME EXERCISE PROGRAM: Access Code: [email or web address] URL: [email or web address] Date: 12/13/2021 Prepared by: Benito Mccreedy   GOALS: Goals reviewed with patient? Yes   SHORT TERM GOALS: (STG required if POC>30 days) Target Date: 12/23/21   Pt will demo/state understanding of initial HEP to improve pain levels and prerequisite motion. Goal status: Progressing   LONG TERM GOALS: Target Date: 01/20/22  Pt will improve functional ability by decreased impairment per Quick DASH assessment from 43% to 20% or better, for better quality of life. Goal status: INITIAL  2.  Pt will improve grip strength in Lt hand from 16lbs to at least 25#lbs for functional use at home and in IADLs. Goal status: INITIAL  3.  Pt will improve A/ROM in Lt sh abd from 90* painful to at least 125*, to have functional motion for tasks like reach and grasp.  Goal status: INITIAL  4.  Pt will improve strength in b/l shoulder flexion from 3-/5 MMT to at least 4+/5 MMT to have increased  functional  ability to carry out selfcare and higher-level homecare tasks with no difficulty. Goal status: INITIAL  5.  Pt will improve coordination skills in b/l hands, as seen by better score on 9HPT testing from TBD to TBD or better to have increased functional ability to carry out fine motor tasks (fasteners, etc.) and more complex, coordinated IADLs (meal prep, sports, etc.).  Goal status: N/A - FMS seem fairly equal b/l and this goal is d/c now (12/20/21  6.  Pt will decrease pain at worst from 6-7/10 to 2/10 or better to have better sleep and occupational participation in daily roles. Goal status: INITIAL   ASSESSMENT:  CLINICAL IMPRESSION: 12/20/21: She is learning long-term management of shoulder and arm stiffness and weakness. We need to do this slowly, carefully, as she has pains and can't tolerate much resistance or rep now. Has full HEP now.     PLAN: OT FREQUENCY: 2x/week  OT DURATION: 6 weeks (through 01/20/22)   PLANNED INTERVENTIONS: self care/ADL training, therapeutic exercise, therapeutic activity, neuromuscular re-education, manual therapy, passive range of motion, splinting, electrical stimulation, ultrasound, fluidotherapy, moist heat, cryotherapy, contrast bath, patient/family education, energy conservation, and coping strategies training  RECOMMENDED OTHER SERVICES: none now   CONSULTED AND AGREED WITH PLAN OF CARE: Patient  PLAN FOR NEXT SESSION:  Review full HEP, advance to activities in clinic. Try to help her find better sleep postures as well.    Benito Mccreedy, OTR/L, CHT 12/20/2021, 12:40 PM

## 2021-12-20 ENCOUNTER — Ambulatory Visit (INDEPENDENT_AMBULATORY_CARE_PROVIDER_SITE_OTHER): Payer: Medicare Other | Admitting: Rehabilitative and Restorative Service Providers"

## 2021-12-20 ENCOUNTER — Encounter: Payer: Self-pay | Admitting: Rehabilitative and Restorative Service Providers"

## 2021-12-20 DIAGNOSIS — M25531 Pain in right wrist: Secondary | ICD-10-CM | POA: Diagnosis not present

## 2021-12-20 DIAGNOSIS — M25631 Stiffness of right wrist, not elsewhere classified: Secondary | ICD-10-CM

## 2021-12-20 DIAGNOSIS — M25611 Stiffness of right shoulder, not elsewhere classified: Secondary | ICD-10-CM

## 2021-12-20 DIAGNOSIS — M25512 Pain in left shoulder: Secondary | ICD-10-CM

## 2021-12-20 DIAGNOSIS — M25612 Stiffness of left shoulder, not elsewhere classified: Secondary | ICD-10-CM | POA: Diagnosis not present

## 2021-12-20 DIAGNOSIS — M25632 Stiffness of left wrist, not elsewhere classified: Secondary | ICD-10-CM | POA: Diagnosis not present

## 2021-12-20 DIAGNOSIS — R278 Other lack of coordination: Secondary | ICD-10-CM

## 2021-12-20 DIAGNOSIS — M6281 Muscle weakness (generalized): Secondary | ICD-10-CM

## 2021-12-21 NOTE — Therapy (Signed)
OUTPATIENT OCCUPATIONAL THERAPY TREATMENT NOTE  Patient Name: April Donaldson MRN: [id number] DOB:03/10/1931, 89 y.o., female Today's Date: 12/22/2021  PCP: Crawford, Elizabeth, MD REFERRING PROVIDER: Dean, Gregory Scott, MD   OT End of Session - 12/22/21 1149     Visit Number 5    Number of Visits 12    Date for OT Re-Evaluation 01/20/22    Authorization Type [employer] Medicare    Progress Note Due on Visit 10    OT Start Time 1149    OT Stop Time 1221    OT Time Calculation (min) 32 min    Equipment Utilized During Treatment --    Activity Tolerance Patient tolerated treatment well;No increased pain;Patient limited by pain;Patient limited by fatigue    Behavior During Therapy WFL for tasks assessed/performed              Past Medical History:  Diagnosis Date   Anxiety    Benign neoplasm of kidney, except pelvis    Cellulitis of leg 03/09/2013 hosp    Cerebrovascular disease    Chronic low back pain    Diabetes mellitus without complication (HCC) 10/25/2021   Diastolic CHF (HCC)    Dr Henry Smith   Difficult intubation    per patient with hand surgery in 2016 at surgical center- lip was pinched and swollen after surgery- patient states dr weingold never stated a problem with intubation    Diverticulosis    DJD (degenerative joint disease)    Dyspnea    with exertion    GERD (gastroesophageal reflux disease)    no meds   Glaucoma    Gout    H/O hiatal hernia    Hepatitis    hx of years ago    Hx of colonic polyps    Hypercholesteremia    Hypertension    Hypothyroid    IBS (irritable bowel syndrome)    Internal hemorrhoids    Obesity    OSA (obstructive sleep apnea)    NPSG 2009: AHI 20/h. CPAP - follows with [facility]   Renal insufficiency    one kidney small and non funtioning   Tubular adenoma 2000   Venous insufficiency    Past Surgical History:  Procedure Laterality Date   ABDOMINAL HYSTERECTOMY     APPENDECTOMY     CATARACT EXTRACTION, BILATERAL  Bilateral 2017   cataracts Bilateral    COLONOSCOPY     CYSTOSCOPY/RETROGRADE/URETEROSCOPY Bilateral 04/09/2017   Procedure: CYSTOSCOPY/RETROGRADE;  Surgeon: Borden, Lester, MD;  Location: [facility];  Service: Urology;  Laterality: Bilateral;  ONLY NEEDS 30 MIN FOR PROCEDURE   DILATION AND CURETTAGE OF UTERUS     FUNCTIONAL ENDOSCOPIC SINUS SURGERY  05/2003   Dr. Crossley   hysterctomy and ooperectomy  1970's   MASS EXCISION  2003   lt   MASS EXCISION Left 09/17/2013   Procedure: EXCISION MASS LEFT HAND;  Surgeon: Matthew A Weingold, MD;  Location: [facility];  Service: Orthopedics;  Laterality: Left;   thyroid lobectomy for a cyst  10/2000   Dr. Crossley   TONSILLECTOMY     Patient Active Problem List   Diagnosis Date Noted   Allergic reaction 11/09/2021   Acquired deformity of lower leg 10/25/2021   Leg swelling 10/25/2021   Anemia 10/25/2021   Bilateral shoulder pain 08/12/2021   Cervical pain (neck) 07/28/2021   Hypokalemia 05/27/2021   Bilateral carotid artery stenosis 03/25/2021   Grade II diastolic dysfunction 02/13/2021   Chest pain 02/11/2021   Dyspnea   40/34/7425   Systolic murmur 95/63/8756   Numbness and tingling in left hand 06/26/2019   Left shoulder pain 11/28/2018   Fatigue 02/08/2018   Candidal dermatitis 07/21/2016   Bilateral pseudophakia 04/10/2016   Routine general medical examination at a health care facility 06/15/2014   Primary open angle glaucoma of both eyes, mild stage 07/10/2011   Obstructive sleep apnea 11/11/2007   GLAUCOMA 09/22/2007   CKD (chronic kidney disease) stage 3, GFR 30-59 ml/min ([place]) 03/21/2007   Hypothyroidism 01/04/2007   Hyperlipidemia 01/04/2007   GOUT 01/04/2007   Essential hypertension 01/04/2007    ONSET DATE: About June 2023 (~3.5 months)  REFERRING DIAG: R29.898 (ICD-10-CM) - Bilateral arm weakness  THERAPY DIAG:  Stiffness of left shoulder, not elsewhere classified  Stiffness of left wrist, not elsewhere  classified  Pain in right wrist  Stiffness of right shoulder, not elsewhere classified  Other lack of coordination  Acute pain of left shoulder  Stiffness of right wrist, not elsewhere classified  Muscle weakness (generalized)  Rationale for Evaluation and Treatment Rehabilitation  PERTINENT HISTORY: Per MD orders: "BUE general strengthening. 2x/wk for 6wks." She also has hx of Rt shoulder pain.  From eval: "She is is retired, She states some memory issues but that she thinks that in June this year, she started having neck pain and b/l shoulder pain R > L. She states she lost ability to raise her arms, and pain and stiffness seemed to travel down her arms into her hands. She states her two main issues now are painful stiff shoulders and wrist/hands R > L. She states having MRI and EMG and having no significant results. "  PRECAUTIONS: None  WEIGHT BEARING RESTRICTIONS No   SUBJECTIVE:   SUBJECTIVE STATEMENT: She states wrists/fingers are a little aching from putty. States, I notice that I'm doing much more than I was before at home.   PAIN:  Are you having pain? Yes  Rating: 2-3/10 aching pain at rest, wrists and shoulders today   OBJECTIVE: (All objective assessments below are from initial evaluation on: 12/08/21 unless otherwise specified.)    HAND DOMINANCE: Right   ADLs: Overall ADLs: States decreased ability to grab, hold household objects, pain and inability to open containers, perform FMS tasks (manipulate fasteners on clothing), mild to moderate bathing problems as well.    FUNCTIONAL OUTCOME MEASURES: Eval: Quck DASH 43% impairment today  (Higher % Score  =  More Impairment)     UPPER EXTREMITY ROM     Shoulder to Wrist AROM Right eval Left eval  Shoulder flexion 132 113  Shoulder abduction 132 91 pain  Shoulder extension    Shoulder internal rotation Rear hip Lateral hip  Shoulder external rotation    Elbow flexion full full  Elbow extension full  full  Forearm supination full full  Forearm pronation  full full  Wrist flexion 62 45  Wrist extension 65 68  (Blank rows = not tested)   Hand AROM Right eval Left eval  Full Fist Ability (or Gap to Distal Palmar Crease) full full  Thumb Opposition to Small Finger (or Gap) full full  (Blank rows = not tested)   UPPER EXTREMITY MMT:    Eval:  NT at eval due to pain and time constraints. But mostly 3-/5 MMT observed in b/l UEs   MMT Right TBD Left TBD  Shoulder flexion    Shoulder abduction    Shoulder adduction    Shoulder extension    Shoulder internal rotation  Shoulder external rotation    Middle trapezius    Lower trapezius    Elbow flexion    Elbow extension    Forearm supination    Forearm pronation    Wrist flexion    Wrist extension    Wrist ulnar deviation    Wrist radial deviation    (Blank rows = not tested)  HAND FUNCTION: Eval: Observed weakness in affected hand.  Grip strength Right: 26 lbs, Left: 16 lbs   COORDINATION: 12/20/21: 9 Hole Peg Test  Right: 28sec, Left: 26 sec  Eval: Observed coordination difficulties with both hands and Lt > Rt shoulder.    SENSATION: Eval: Light touch intact today (also states "negative" EMG findings)   EDEMA:   Eval: none significant   COGNITION: Eval: Overall cognitive status: WFL for evaluation today, though mild memory issues   OBSERVATIONS:   Eval: She seemed to have better motion in Rt dom shoulder and arm today, Lt looked like impinging at 90* abd, painful. Both hands seemed to be sluggish motion and poor coordination. She describes something like a learned helplessness caused by pain that kept her in bed a times, resulted in decreased motion and function with distal UEs, likely exacerbating issues.     TODAY'S TREATMENT:  12/22/21: OT starts with HEP review and she performs wrist stretches due to soreness there- requires cues.  She then uses UBE at 1.0 resistance and is only able to go ~32RPM. She  does 2 min forward, 2 min back. Next she reviews AAROM and performs b/l sh flexion, abd, extensions.  She finishes therapy session by doing several fnl activities while seated and standing (catch/throw light ball, flexbar dynamic wrist strength).   12/20/21: She does 9HPT FMS activity and show no significant FMS deficit.  She reviews HEP and instead of sh stretches at table, she does supine today for alternate option.  She then learns new AAROM for b/l sh strength in seated or standing (as below). Also hand strength with t-putty as below. Again she states "not using" left hand, and OT encourages her to prevent over use to Rt arm and to strengthen Lt: use it! (New exercises below)   Exercises - Standing Shoulder Extension with Dowel  - 4-6 x daily - 1 sets - 10-15 reps - Seated Shoulder Flexion AAROM with Dowel  - 4-6 x daily - 1 sets - 10-15 reps - Standing Shoulder Abduction AAROM with Dowel  - 4-6 x daily - 1 sets - 10-15 reps - Full Fist  - 2-3 x daily - 5 reps - Quick Finger Spreading with Rubber Band  - 4-6 x daily - 1 sets - 10-15 reps - Finger Pinch and Pull with Putty  - 2-3 x daily - 5 reps   PATIENT EDUCATION: Education details: See tx section above for details  Person educated: Patient Education method: Verbal Instruction, Teach back, Handouts  Education comprehension: States and demonstrates understanding, Additional Education required    HOME EXERCISE PROGRAM: Access Code: [email or web address] URL: [email or web address] Date: 12/13/2021 Prepared by: Benito Mccreedy   GOALS: Goals reviewed with patient? Yes   SHORT TERM GOALS: (STG required if POC>30 days) Target Date: 12/23/21   Pt will demo/state understanding of initial HEP to improve pain levels and prerequisite motion. Goal status: 12/22/21: MET   LONG TERM GOALS: Target Date: 01/20/22  Pt will improve functional ability by decreased impairment per Quick DASH assessment from 43% to 20% or better, for better  quality of life. Goal status: INITIAL  2.  Pt will improve grip strength in Lt hand from 16lbs to at least 25#lbs for functional use at home and in IADLs. Goal status: INITIAL  3.  Pt will improve A/ROM in Lt sh abd from 90* painful to at least 125*, to have functional motion for tasks like reach and grasp.  Goal status: INITIAL  4.  Pt will improve strength in b/l shoulder flexion from 3-/5 MMT to at least 4+/5 MMT to have increased functional ability to carry out selfcare and higher-level homecare tasks with no difficulty. Goal status: INITIAL  5.  Pt will improve coordination skills in b/l hands, as seen by better score on 9HPT testing from TBD to TBD or better to have increased functional ability to carry out fine motor tasks (fasteners, etc.) and more complex, coordinated IADLs (meal prep, sports, etc.).  Goal status: N/A - FMS seem fairly equal b/l and this goal is d/c now (12/20/21  6.  Pt will decrease pain at worst from 6-7/10 to 2/10 or better to have better sleep and occupational participation in daily roles. Goal status: INITIAL   ASSESSMENT:  CLINICAL IMPRESSION: 12/22/21: She is doing well, getting motion back and strength slowly. Lt shoulder pain was limiting her somewhat today as well as general fatigue   12/20/21: She is learning long-term management of shoulder and arm stiffness and weakness. We need to do this slowly, carefully, as she has pains and can't tolerate much resistance or rep now. Has full HEP now.     PLAN: OT FREQUENCY: 2x/week  OT DURATION: 6 weeks (through 01/20/22)   PLANNED INTERVENTIONS: self care/ADL training, therapeutic exercise, therapeutic activity, neuromuscular re-education, manual therapy, passive range of motion, splinting, electrical stimulation, ultrasound, fluidotherapy, moist heat, cryotherapy, contrast bath, patient/family education, energy conservation, and coping strategies training  RECOMMENDED OTHER SERVICES: none now    CONSULTED AND AGREED WITH PLAN OF CARE: Patient  PLAN FOR NEXT SESSION:  Review HEP as needed and keep moving.    Benito Mccreedy, OTR/L, CHT 12/22/2021, 12:24 PM

## 2021-12-22 ENCOUNTER — Ambulatory Visit (INDEPENDENT_AMBULATORY_CARE_PROVIDER_SITE_OTHER): Payer: Medicare Other | Admitting: Rehabilitative and Restorative Service Providers"

## 2021-12-22 ENCOUNTER — Encounter: Payer: Self-pay | Admitting: Rehabilitative and Restorative Service Providers"

## 2021-12-22 DIAGNOSIS — M25611 Stiffness of right shoulder, not elsewhere classified: Secondary | ICD-10-CM

## 2021-12-22 DIAGNOSIS — M25531 Pain in right wrist: Secondary | ICD-10-CM

## 2021-12-22 DIAGNOSIS — M25612 Stiffness of left shoulder, not elsewhere classified: Secondary | ICD-10-CM | POA: Diagnosis not present

## 2021-12-22 DIAGNOSIS — M25512 Pain in left shoulder: Secondary | ICD-10-CM

## 2021-12-22 DIAGNOSIS — M25632 Stiffness of left wrist, not elsewhere classified: Secondary | ICD-10-CM

## 2021-12-22 DIAGNOSIS — M25631 Stiffness of right wrist, not elsewhere classified: Secondary | ICD-10-CM

## 2021-12-22 DIAGNOSIS — R278 Other lack of coordination: Secondary | ICD-10-CM

## 2021-12-22 DIAGNOSIS — M6281 Muscle weakness (generalized): Secondary | ICD-10-CM

## 2021-12-28 NOTE — Therapy (Signed)
OUTPATIENT OCCUPATIONAL THERAPY TREATMENT NOTE  Patient Name: April Donaldson MRN: [id number] DOB:15-Dec-1931, 88 y.o., female Today's Date: 12/29/2021  PCP: Pricilla Holm, MD REFERRING PROVIDER: Meredith Pel, MD   OT End of Session - 12/29/21 1150     Visit Number 6    Number of Visits 12    Date for OT Re-Evaluation 01/20/22    Authorization Type [employer] Medicare    Progress Note Due on Visit 10    OT Start Time 1155    OT Stop Time 1231    OT Time Calculation (min) 36 min    Activity Tolerance Patient tolerated treatment well;No increased pain;Patient limited by pain;Patient limited by fatigue    Behavior During Therapy [facility] for tasks assessed/performed               Past Medical History:  Diagnosis Date   Anxiety    Benign neoplasm of kidney, except pelvis    Cellulitis of leg 03/09/2013 hosp    Cerebrovascular disease    Chronic low back pain    Diabetes mellitus without complication ([place]) 05/06/4074   Diastolic CHF (HCC)    Dr Daneen Schick   Difficult intubation    per patient with hand surgery in 2016 at surgical center- lip was pinched and swollen after surgery- patient states dr Burney Gauze never stated a problem with intubation    Diverticulosis    DJD (degenerative joint disease)    Dyspnea    with exertion    GERD (gastroesophageal reflux disease)    no meds   Glaucoma    Gout    H/O hiatal hernia    Hepatitis    hx of years ago    Hx of colonic polyps    Hypercholesteremia    Hypertension    Hypothyroid    IBS (irritable bowel syndrome)    Internal hemorrhoids    Obesity    OSA (obstructive sleep apnea)    NPSG 2009: AHI 20/h. CPAP - follows with [facility]   Renal insufficiency    one kidney small and non funtioning   Tubular adenoma 2000   Venous insufficiency    Past Surgical History:  Procedure Laterality Date   ABDOMINAL HYSTERECTOMY     APPENDECTOMY     CATARACT EXTRACTION, BILATERAL Bilateral 2017   cataracts Bilateral     COLONOSCOPY     CYSTOSCOPY/RETROGRADE/URETEROSCOPY Bilateral 04/09/2017   Procedure: CYSTOSCOPY/RETROGRADE;  Surgeon: Raynelle Bring, MD;  Location: [facility];  Service: Urology;  Laterality: Bilateral;  ONLY NEEDS 30 MIN FOR PROCEDURE   DILATION AND CURETTAGE OF UTERUS     FUNCTIONAL ENDOSCOPIC SINUS SURGERY  05/2003   Dr. Ernesto Rutherford   hysterctomy and ooperectomy  1970's   MASS EXCISION  2003   lt   MASS EXCISION Left 09/17/2013   Procedure: EXCISION MASS LEFT HAND;  Surgeon: Schuyler Amor, MD;  Location: [facility];  Service: Orthopedics;  Laterality: Left;   thyroid lobectomy for a cyst  10/2000   Dr. Ernesto Rutherford   TONSILLECTOMY     Patient Active Problem List   Diagnosis Date Noted   Allergic reaction 11/09/2021   Acquired deformity of lower leg 10/25/2021   Leg swelling 10/25/2021   Anemia 10/25/2021   Bilateral shoulder pain 08/12/2021   Cervical pain (neck) 07/28/2021   Hypokalemia 05/27/2021   Bilateral carotid artery stenosis 03/25/2021   Grade II diastolic dysfunction 80/88/1103   Chest pain 02/11/2021   Dyspnea 15/94/5859   Systolic murmur 29/24/4628  Numbness and tingling in left hand 06/26/2019   Left shoulder pain 11/28/2018   Fatigue 02/08/2018   Candidal dermatitis 07/21/2016   Bilateral pseudophakia 04/10/2016   Routine general medical examination at a health care facility 06/15/2014   Primary open angle glaucoma of both eyes, mild stage 07/10/2011   Obstructive sleep apnea 11/11/2007   GLAUCOMA 09/22/2007   CKD (chronic kidney disease) stage 3, GFR 30-59 ml/min ([place]) 03/21/2007   Hypothyroidism 01/04/2007   Hyperlipidemia 01/04/2007   GOUT 01/04/2007   Essential hypertension 01/04/2007    ONSET DATE: About June 2023 (~3.5 months)  REFERRING DIAG: R29.898 (ICD-10-CM) - Bilateral arm weakness  THERAPY DIAG:  Stiffness of left shoulder, not elsewhere classified  Stiffness of left wrist, not elsewhere classified  Pain in right  wrist  Other lack of coordination  Muscle weakness (generalized)  Stiffness of right wrist, not elsewhere classified  Stiffness of right shoulder, not elsewhere classified  Acute pain of left shoulder  Rationale for Evaluation and Treatment Rehabilitation  PERTINENT HISTORY: Per MD orders: "BUE general strengthening. 2x/wk for 6wks." She also has hx of Rt shoulder pain.  From eval: "She is is retired, She states some memory issues but that she thinks that in June this year, she started having neck pain and b/l shoulder pain R > L. She states she lost ability to raise her arms, and pain and stiffness seemed to travel down her arms into her hands. She states her two main issues now are painful stiff shoulders and wrist/hands R > L. She states having MRI and EMG and having no significant results. "  PRECAUTIONS: None  WEIGHT BEARING RESTRICTIONS No   SUBJECTIVE:   SUBJECTIVE STATEMENT: She states not feeling achy this morning, other than the cold weather affecting her.    PAIN:  Are you having pain? Yes  Rating: 1-2/10 aching pain at rest, wrists and shoulders today   OBJECTIVE: (All objective assessments below are from initial evaluation on: 12/08/21 unless otherwise specified.)    HAND DOMINANCE: Right   ADLs: Overall ADLs: States decreased ability to grab, hold household objects, pain and inability to open containers, perform FMS tasks (manipulate fasteners on clothing), mild to moderate bathing problems as well.    FUNCTIONAL OUTCOME MEASURES: Eval: Quck DASH 43% impairment today  (Higher % Score  =  More Impairment)     UPPER EXTREMITY ROM     Shoulder to Wrist AROM Right eval Left eval Right  /  Left 12/29/21  Shoulder flexion 132 113 149  /  130  Shoulder abduction 132 91 pain 144  /  136  Shoulder extension     Shoulder internal rotation Rear hip Lateral hip L1  /  L4  Shoulder external rotation     Elbow flexion full full   Elbow extension full full    Forearm supination full full   Forearm pronation  full full   Wrist flexion 62 45 53  /  43  Wrist extension 65 68 55  /  58  (Blank rows = not tested)    UPPER EXTREMITY MMT:     MMT Right TBD Left TBD  Shoulder flexion    Shoulder abduction    Shoulder adduction    Shoulder extension    Shoulder internal rotation    Shoulder external rotation    Middle trapezius    Lower trapezius    Elbow flexion    Elbow extension    Forearm supination    Forearm  pronation    Wrist flexion    Wrist extension    Wrist ulnar deviation    Wrist radial deviation    (Blank rows = not tested)  HAND FUNCTION: 12/29/21: Rt grip 27 #, Lt grip 21 #  Eval: Observed weakness in affected hand.  Grip strength Right: 26 lbs, Left: 16 lbs   COORDINATION: 12/20/21: 9 Hole Peg Test  Right: 28sec, Left: 26 sec  Eval: Observed coordination difficulties with both hands and Lt > Rt shoulder.    SENSATION: Eval: Light touch intact today (also states "negative" EMG findings)   EDEMA:   Eval: none significant   COGNITION: Eval: Overall cognitive status: WFL for evaluation today, though mild memory issues   OBSERVATIONS:   Eval: She seemed to have better motion in Rt dom shoulder and arm today, Lt looked like impinging at 90* abd, painful. Both hands seemed to be sluggish motion and poor coordination. She describes something like a learned helplessness caused by pain that kept her in bed a times, resulted in decreased motion and function with distal UEs, likely exacerbating issues.     TODAY'S TREATMENT:  12/29/21: She starts with AROM and gripping for new measures of function and ability. These show great improvement for both shoulders, but wrists somewhat tight still, so we focused today's session on wrist stretching and strengthening, adding bicep curls and b/l sh ER with wrist ext with red t-bands.  She tolerates both without pain but fatigues quickly. These were printed out to add to HEP. She  states understanding.   Exercises - Wrist Flexion Stretch  - 4 x daily - 3-5 reps - 15 sec hold - Wrist Prayer Stretch  - 4 x daily - 3-5 reps - 15 sec hold - Seated Bilateral Elbow Flexion with Resistance on Swiss Ball  - 1-2 x daily - 1-2 sets - 5-10 reps - Shoulder External Rotation with Resistance  - 1-2 x daily - 1-2 sets - 5-10 reps     12/22/21: OT starts with HEP review and she performs wrist stretches due to soreness there- requires cues.  She then uses UBE at 1.0 resistance and is only able to go ~32RPM. She does 2 min forward, 2 min back. Next she reviews AAROM and performs b/l sh flexion, abd, extensions.  She finishes therapy session by doing several fnl activities while seated and standing (catch/throw light ball, flexbar dynamic wrist strength).    PATIENT EDUCATION: Education details: See tx section above for details  Person educated: Patient Education method: Verbal Instruction, Teach back, Handouts  Education comprehension: States and demonstrates understanding, Additional Education required    HOME EXERCISE PROGRAM: Access Code: [email or web address] URL: [email or web address]   GOALS: Goals reviewed with patient? Yes   SHORT TERM GOALS: (STG required if POC>30 days) Target Date: 12/23/21   Pt will demo/state understanding of initial HEP to improve pain levels and prerequisite motion. Goal status: 12/22/21: MET   LONG TERM GOALS: Target Date: 01/20/22  Pt will improve functional ability by decreased impairment per Quick DASH assessment from 43% to 20% or better, for better quality of life. Goal status: INITIAL  2.  Pt will improve grip strength in Lt hand from 16lbs to at least 25#lbs for functional use at home and in IADLs. Goal status: INITIAL  3.  Pt will improve A/ROM in Lt sh abd from 90* painful to at least 125*, to have functional motion for tasks like reach and grasp.  Goal status: Progressing   4.  Pt will improve strength in b/l shoulder  flexion from 3-/5 MMT to at least 4+/5 MMT to have increased functional ability to carry out selfcare and higher-level homecare tasks with no difficulty. Goal status: INITIAL  5.  Pt will improve coordination skills in b/l hands, as seen by better score on 9HPT testing from TBD to TBD or better to have increased functional ability to carry out fine motor tasks (fasteners, etc.) and more complex, coordinated IADLs (meal prep, sports, etc.).  Goal status: N/A - FMS seem fairly equal b/l and this goal is d/c now (12/20/21  6.  Pt will decrease pain at worst from 6-7/10 to 2/10 or better to have better sleep and occupational participation in daily roles. Goal status: INITIAL   ASSESSMENT:  CLINICAL IMPRESSION: 12/29/21: She is really doing well, just needs to improve wrist function and endurance.    12/22/21: She is doing well, getting motion back and strength slowly. Lt shoulder pain was limiting her somewhat today as well as general fatigue   12/20/21: She is learning long-term management of shoulder and arm stiffness and weakness. We need to do this slowly, carefully, as she has pains and can't tolerate much resistance or rep now. Has full HEP now.     PLAN: OT FREQUENCY: 2x/week  OT DURATION: 6 weeks (through 01/20/22)   PLANNED INTERVENTIONS: self care/ADL training, therapeutic exercise, therapeutic activity, neuromuscular re-education, manual therapy, passive range of motion, splinting, electrical stimulation, ultrasound, fluidotherapy, moist heat, cryotherapy, contrast bath, patient/family education, energy conservation, and coping strategies training  RECOMMENDED OTHER SERVICES: none now   CONSULTED AND AGREED WITH PLAN OF CARE: Patient  PLAN FOR NEXT SESSION:  Review 2 new wrist exercises and get back to full arm strength and stretches, etc.    Benito Mccreedy, OTR/L, CHT 12/29/2021, 12:37 PM

## 2021-12-29 ENCOUNTER — Ambulatory Visit (INDEPENDENT_AMBULATORY_CARE_PROVIDER_SITE_OTHER): Payer: Medicare Other | Admitting: Rehabilitative and Restorative Service Providers"

## 2021-12-29 ENCOUNTER — Encounter: Payer: Self-pay | Admitting: Rehabilitative and Restorative Service Providers"

## 2021-12-29 ENCOUNTER — Encounter: Payer: Medicare Other | Admitting: Rehabilitative and Restorative Service Providers"

## 2021-12-29 DIAGNOSIS — M25531 Pain in right wrist: Secondary | ICD-10-CM

## 2021-12-29 DIAGNOSIS — M25612 Stiffness of left shoulder, not elsewhere classified: Secondary | ICD-10-CM | POA: Diagnosis not present

## 2021-12-29 DIAGNOSIS — M6281 Muscle weakness (generalized): Secondary | ICD-10-CM

## 2021-12-29 DIAGNOSIS — M25611 Stiffness of right shoulder, not elsewhere classified: Secondary | ICD-10-CM

## 2021-12-29 DIAGNOSIS — M25631 Stiffness of right wrist, not elsewhere classified: Secondary | ICD-10-CM

## 2021-12-29 DIAGNOSIS — R278 Other lack of coordination: Secondary | ICD-10-CM

## 2021-12-29 DIAGNOSIS — M25512 Pain in left shoulder: Secondary | ICD-10-CM

## 2021-12-29 DIAGNOSIS — M25632 Stiffness of left wrist, not elsewhere classified: Secondary | ICD-10-CM

## 2022-01-02 NOTE — Therapy (Signed)
OUTPATIENT OCCUPATIONAL THERAPY TREATMENT NOTE  Patient Name: April Donaldson MRN: [id number] DOB:08/07/1931, 88 y.o., female Today's Date: 01/03/2022  PCP: Pricilla Holm, MD REFERRING PROVIDER: Meredith Pel, MD  OT End of Session - 01/03/22 1145     Visit Number 7    Number of Visits 12    Date for OT Re-Evaluation 01/20/22    Authorization Type [employer] Medicare    Progress Note Due on Visit 10    OT Start Time 1145    OT Stop Time 1225    OT Time Calculation (min) 40 min    Activity Tolerance Patient tolerated treatment well;No increased pain;Patient limited by pain;Patient limited by fatigue    Behavior During Therapy [facility] for tasks assessed/performed                Past Medical History:  Diagnosis Date   Anxiety    Benign neoplasm of kidney, except pelvis    Cellulitis of leg 03/09/2013 hosp    Cerebrovascular disease    Chronic low back pain    Diabetes mellitus without complication ([place]) 03/13/7260   Diastolic CHF (HCC)    Dr Daneen Schick   Difficult intubation    per patient with hand surgery in 2016 at surgical center- lip was pinched and swollen after surgery- patient states dr Burney Gauze never stated a problem with intubation    Diverticulosis    DJD (degenerative joint disease)    Dyspnea    with exertion    GERD (gastroesophageal reflux disease)    no meds   Glaucoma    Gout    H/O hiatal hernia    Hepatitis    hx of years ago    Hx of colonic polyps    Hypercholesteremia    Hypertension    Hypothyroid    IBS (irritable bowel syndrome)    Internal hemorrhoids    Obesity    OSA (obstructive sleep apnea)    NPSG 2009: AHI 20/h. CPAP - follows with [facility]   Renal insufficiency    one kidney small and non funtioning   Tubular adenoma 2000   Venous insufficiency    Past Surgical History:  Procedure Laterality Date   ABDOMINAL HYSTERECTOMY     APPENDECTOMY     CATARACT EXTRACTION, BILATERAL Bilateral 2017   cataracts Bilateral     COLONOSCOPY     CYSTOSCOPY/RETROGRADE/URETEROSCOPY Bilateral 04/09/2017   Procedure: CYSTOSCOPY/RETROGRADE;  Surgeon: Raynelle Bring, MD;  Location: [facility];  Service: Urology;  Laterality: Bilateral;  ONLY NEEDS 30 MIN FOR PROCEDURE   DILATION AND CURETTAGE OF UTERUS     FUNCTIONAL ENDOSCOPIC SINUS SURGERY  05/2003   Dr. Ernesto Rutherford   hysterctomy and ooperectomy  1970's   MASS EXCISION  2003   lt   MASS EXCISION Left 09/17/2013   Procedure: EXCISION MASS LEFT HAND;  Surgeon: Schuyler Amor, MD;  Location: [place];  Service: Orthopedics;  Laterality: Left;   thyroid lobectomy for a cyst  10/2000   Dr. Ernesto Rutherford   TONSILLECTOMY     Patient Active Problem List   Diagnosis Date Noted   Allergic reaction 11/09/2021   Acquired deformity of lower leg 10/25/2021   Leg swelling 10/25/2021   Anemia 10/25/2021   Bilateral shoulder pain 08/12/2021   Cervical pain (neck) 07/28/2021   Hypokalemia 05/27/2021   Bilateral carotid artery stenosis 03/25/2021   Grade II diastolic dysfunction 03/55/9741   Chest pain 02/11/2021   Dyspnea 63/84/5364   Systolic murmur 68/04/2120  Numbness and tingling in left hand 06/26/2019   Left shoulder pain 11/28/2018   Fatigue 02/08/2018   Candidal dermatitis 07/21/2016   Bilateral pseudophakia 04/10/2016   Routine general medical examination at a health care facility 06/15/2014   Primary open angle glaucoma of both eyes, mild stage 07/10/2011   Obstructive sleep apnea 11/11/2007   GLAUCOMA 09/22/2007   CKD (chronic kidney disease) stage 3, GFR 30-59 ml/min ([place]) 03/21/2007   Hypothyroidism 01/04/2007   Hyperlipidemia 01/04/2007   GOUT 01/04/2007   Essential hypertension 01/04/2007    ONSET DATE: About June 2023 (~3.5 months)  REFERRING DIAG: R29.898 (ICD-10-CM) - Bilateral arm weakness  THERAPY DIAG:  Stiffness of left shoulder, not elsewhere classified  Stiffness of left wrist, not elsewhere classified  Pain in right  wrist  Stiffness of right wrist, not elsewhere classified  Acute pain of left shoulder  Stiffness of right shoulder, not elsewhere classified  Other lack of coordination  Muscle weakness (generalized)  Rationale for Evaluation and Treatment Rehabilitation  PERTINENT HISTORY: Per MD orders: "BUE general strengthening. 2x/wk for 6wks." She also has hx of Rt shoulder pain.  From eval: "She is is retired, She states some memory issues but that she thinks that in June this year, she started having neck pain and b/l shoulder pain R > L. She states she lost ability to raise her arms, and pain and stiffness seemed to travel down her arms into her hands. She states her two main issues now are painful stiff shoulders and wrist/hands R > L. She states having MRI and EMG and having no significant results. "  PRECAUTIONS: None  WEIGHT BEARING RESTRICTIONS No   SUBJECTIVE:   SUBJECTIVE STATEMENT: She walks in slowly today. She states fee;ing a bit tired. She did eat breakfast, but doesn't eat much.    PAIN:  Are you having pain? Yes  Rating: 1-2/10 she states "the worst thing is my sciatic nerve"    OBJECTIVE: (All objective assessments below are from initial evaluation on: 12/08/21 unless otherwise specified.)   HAND DOMINANCE: Right   ADLs: Overall ADLs: States decreased ability to grab, hold household objects, pain and inability to open containers, perform FMS tasks (manipulate fasteners on clothing), mild to moderate bathing problems as well.    FUNCTIONAL OUTCOME MEASURES: Eval: Quck DASH 43% impairment today  (Higher % Score  =  More Impairment)     UPPER EXTREMITY ROM     Shoulder to Wrist AROM Right eval Left eval Right  /  Left 12/29/21  Shoulder flexion 132 113 149  /  130  Shoulder abduction 132 91 pain 144  /  136  Shoulder extension     Shoulder internal rotation Rear hip Lateral hip L1  /  L4  Shoulder external rotation     Elbow flexion full full   Elbow  extension full full   Forearm supination full full   Forearm pronation  full full   Wrist flexion 62 45 53  /  43  Wrist extension 65 68 55  /  58  (Blank rows = not tested)    UPPER EXTREMITY MMT:     MMT Right TBD Left TBD  Shoulder flexion    Shoulder abduction    Shoulder adduction    Shoulder extension    Shoulder internal rotation    Shoulder external rotation    Middle trapezius    Lower trapezius    Elbow flexion    Elbow extension  Forearm supination    Forearm pronation    Wrist flexion    Wrist extension    Wrist ulnar deviation    Wrist radial deviation    (Blank rows = not tested)  HAND FUNCTION: 12/29/21: Rt grip 27 #, Lt grip 21 #  Eval: Observed weakness in affected hand.  Grip strength Right: 26 lbs, Left: 16 lbs   COORDINATION: 12/20/21: 9 Hole Peg Test  Right: 28sec, Left: 26 sec  Eval: Observed coordination difficulties with both hands and Lt > Rt shoulder.    SENSATION: Eval: Light touch intact today (also states "negative" EMG findings)   EDEMA:   Eval: none significant   COGNITION: Eval: Overall cognitive status: WFL for evaluation today, though mild memory issues   OBSERVATIONS:   Eval: She seemed to have better motion in Rt dom shoulder and arm today, Lt looked like impinging at 90* abd, painful. Both hands seemed to be sluggish motion and poor coordination. She describes something like a learned helplessness caused by pain that kept her in bed a times, resulted in decreased motion and function with distal UEs, likely exacerbating issues.     TODAY'S TREATMENT:  01/03/22: She uses UBE 1.0 resistance for 4 mins to get AROM through her b/l UEs dynamically (~40RPM).  She then lies down to stretch shoulders and do AA/PROM in flex, horizontal abd.  In lying. She states good stretches but also that she prefers table slides as she doesn't lie in bed during the day. She sits at edge of plinth to review and perform sh IR stretches, wrist  stretches followed by bicep/wrist flexion and sh ER/ wrist ext strength. In standing She reviews shoulder AAROM in flex, ext, abd for strength and OT upgrades her from 1# dowel to 2# dowel when she does. She states "tired and worn out" at the end, but smiling and doing well.    12/29/21: She starts with AROM and gripping for new measures of function and ability. These show great improvement for both shoulders, but wrists somewhat tight still, so we focused today's session on wrist stretching and strengthening, adding bicep curls and b/l sh ER with wrist ext with red t-bands.  She tolerates both without pain but fatigues quickly. These were printed out to add to HEP. She states understanding.   Exercises - Wrist Flexion Stretch  - 4 x daily - 3-5 reps - 15 sec hold - Wrist Prayer Stretch  - 4 x daily - 3-5 reps - 15 sec hold - Seated Bilateral Elbow Flexion with Resistance on Swiss Ball  - 1-2 x daily - 1-2 sets - 5-10 reps - Shoulder External Rotation with Resistance  - 1-2 x daily - 1-2 sets - 5-10 reps    12/22/21: OT starts with HEP review and she performs wrist stretches due to soreness there- requires cues.  She then uses UBE at 1.0 resistance and is only able to go ~32RPM. She does 2 min forward, 2 min back. Next she reviews AAROM and performs b/l sh flexion, abd, extensions.  She finishes therapy session by doing several fnl activities while seated and standing (catch/throw light ball, flexbar dynamic wrist strength).    PATIENT EDUCATION: Education details: See tx section above for details  Person educated: Patient Education method: Verbal Instruction, Teach back, Handouts  Education comprehension: States and demonstrates understanding, Additional Education required    HOME EXERCISE PROGRAM: Access Code: [email or web address] URL: [email or web address]   GOALS: Goals reviewed with patient? Yes   SHORT TERM  GOALS: (STG required if POC>30 days) Target Date: 12/23/21   Pt will  demo/state understanding of initial HEP to improve pain levels and prerequisite motion. Goal status: 12/22/21: MET   LONG TERM GOALS: Target Date: 01/20/22  Pt will improve functional ability by decreased impairment per Quick DASH assessment from 43% to 20% or better, for better quality of life. Goal status: INITIAL  2.  Pt will improve grip strength in Lt hand from 16lbs to at least 25#lbs for functional use at home and in IADLs. Goal status: INITIAL  3.  Pt will improve A/ROM in Lt sh abd from 90* painful to at least 125*, to have functional motion for tasks like reach and grasp.  Goal status: Progressing   4.  Pt will improve strength in b/l shoulder flexion from 3-/5 MMT to at least 4+/5 MMT to have increased functional ability to carry out selfcare and higher-level homecare tasks with no difficulty. Goal status: INITIAL  5.  Pt will improve coordination skills in b/l hands, as seen by better score on 9HPT testing from TBD to TBD or better to have increased functional ability to carry out fine motor tasks (fasteners, etc.) and more complex, coordinated IADLs (meal prep, sports, etc.).  Goal status: N/A - FMS seem fairly equal b/l and this goal is d/c now (12/20/21  6.  Pt will decrease pain at worst from 6-7/10 to 2/10 or better to have better sleep and occupational participation in daily roles. Goal status: INITIAL   ASSESSMENT:  CLINICAL IMPRESSION: 01/03/22: She continues to do well and tolerate treatments well.  OT will start to incorporate fnl activities into session, now that her motion and strength have improved.    PLAN: OT FREQUENCY: 2x/week  OT DURATION: 6 weeks (through 01/20/22)   PLANNED INTERVENTIONS: self care/ADL training, therapeutic exercise, therapeutic activity, neuromuscular re-education, manual therapy, passive range of motion, splinting, electrical stimulation, ultrasound, fluidotherapy, moist heat, cryotherapy, contrast bath, patient/family education,  energy conservation, and coping strategies training  RECOMMENDED OTHER SERVICES: none now   CONSULTED AND AGREED WITH PLAN OF CARE: Patient  PLAN FOR NEXT SESSION:  Go over HEP and warm-up, but also try to incorporate fnl reaching and grasping/activities more into sessions.     Benito Mccreedy, OTR/L, CHT 01/03/2022, 12:26 PM

## 2022-01-03 ENCOUNTER — Ambulatory Visit (INDEPENDENT_AMBULATORY_CARE_PROVIDER_SITE_OTHER): Payer: Medicare Other | Admitting: Rehabilitative and Restorative Service Providers"

## 2022-01-03 ENCOUNTER — Encounter: Payer: Self-pay | Admitting: Rehabilitative and Restorative Service Providers"

## 2022-01-03 DIAGNOSIS — M25632 Stiffness of left wrist, not elsewhere classified: Secondary | ICD-10-CM

## 2022-01-03 DIAGNOSIS — M25611 Stiffness of right shoulder, not elsewhere classified: Secondary | ICD-10-CM

## 2022-01-03 DIAGNOSIS — M25631 Stiffness of right wrist, not elsewhere classified: Secondary | ICD-10-CM | POA: Diagnosis not present

## 2022-01-03 DIAGNOSIS — M25512 Pain in left shoulder: Secondary | ICD-10-CM

## 2022-01-03 DIAGNOSIS — R278 Other lack of coordination: Secondary | ICD-10-CM

## 2022-01-03 DIAGNOSIS — M25612 Stiffness of left shoulder, not elsewhere classified: Secondary | ICD-10-CM | POA: Diagnosis not present

## 2022-01-03 DIAGNOSIS — M6281 Muscle weakness (generalized): Secondary | ICD-10-CM

## 2022-01-03 DIAGNOSIS — M25531 Pain in right wrist: Secondary | ICD-10-CM

## 2022-01-04 NOTE — Therapy (Signed)
OUTPATIENT OCCUPATIONAL THERAPY TREATMENT NOTE  Patient Name: April Donaldson MRN: [id number] DOB:01-03-1932, 88 y.o., female Today's Date: 01/05/2022  PCP: Pricilla Holm, MD REFERRING PROVIDER: Meredith Pel, MD  OT End of Session - 01/05/22 1151     Visit Number 8    Number of Visits 12    Date for OT Re-Evaluation 01/20/22    Authorization Type [employer]HC Medicare    Progress Note Due on Visit 10    OT Start Time 1149    OT Stop Time 1229    OT Time Calculation (min) 40 min    Activity Tolerance Patient tolerated treatment well;No increased pain;Patient limited by fatigue    Behavior During Therapy [facility] for tasks assessed/performed             Past Medical History:  Diagnosis Date   Anxiety    Benign neoplasm of kidney, except pelvis    Cellulitis of leg 03/09/2013 hosp    Cerebrovascular disease    Chronic low back pain    Diabetes mellitus without complication ([place]) 5/57/3220   Diastolic CHF (HCC)    Dr Daneen Schick   Difficult intubation    per patient with hand surgery in 2016 at surgical center- lip was pinched and swollen after surgery- patient states dr Burney Gauze never stated a problem with intubation    Diverticulosis    DJD (degenerative joint disease)    Dyspnea    with exertion    GERD (gastroesophageal reflux disease)    no meds   Glaucoma    Gout    H/O hiatal hernia    Hepatitis    hx of years ago    Hx of colonic polyps    Hypercholesteremia    Hypertension    Hypothyroid    IBS (irritable bowel syndrome)    Internal hemorrhoids    Obesity    OSA (obstructive sleep apnea)    NPSG 2009: AHI 20/h. CPAP - follows with [facility]   Renal insufficiency    one kidney small and non funtioning   Tubular adenoma 2000   Venous insufficiency    Past Surgical History:  Procedure Laterality Date   ABDOMINAL HYSTERECTOMY     APPENDECTOMY     CATARACT EXTRACTION, BILATERAL Bilateral 2017   cataracts Bilateral    COLONOSCOPY      CYSTOSCOPY/RETROGRADE/URETEROSCOPY Bilateral 04/09/2017   Procedure: CYSTOSCOPY/RETROGRADE;  Surgeon: Raynelle Bring, MD;  [facility];  Service: Urology;  Laterality: Bilateral;  ONLY NEEDS 30 MIN FOR PROCEDURE   DILATION AND CURETTAGE OF UTERUS     FUNCTIONAL ENDOSCOPIC SINUS SURGERY  05/2003   Dr. Ernesto Rutherford   hysterctomy and ooperectomy  1970's   MASS EXCISION  2003   lt   MASS EXCISION Left 09/17/2013   Procedure: EXCISION MASS LEFT HAND;  Surgeon: Schuyler Amor, MD;  Location: [place];  Service: Orthopedics;  Laterality: Left;   thyroid lobectomy for a cyst  10/2000   Dr. Ernesto Rutherford   TONSILLECTOMY     Patient Active Problem List   Diagnosis Date Noted   Allergic reaction 11/09/2021   Acquired deformity of lower leg 10/25/2021   Leg swelling 10/25/2021   Anemia 10/25/2021   Bilateral shoulder pain 08/12/2021   Cervical pain (neck) 07/28/2021   Hypokalemia 05/27/2021   Bilateral carotid artery stenosis 03/25/2021   Grade II diastolic dysfunction 25/42/7062   Chest pain 02/11/2021   Dyspnea 37/62/8315   Systolic murmur 17/61/6073   Numbness and tingling in left  hand 06/26/2019   Left shoulder pain 11/28/2018   Fatigue 02/08/2018   Candidal dermatitis 07/21/2016   Bilateral pseudophakia 04/10/2016   Routine general medical examination at a health care facility 06/15/2014   Primary open angle glaucoma of both eyes, mild stage 07/10/2011   Obstructive sleep apnea 11/11/2007   GLAUCOMA 09/22/2007   CKD (chronic kidney disease) stage 3, GFR 30-59 ml/min ([place]) 03/21/2007   Hypothyroidism 01/04/2007   Hyperlipidemia 01/04/2007   GOUT 01/04/2007   Essential hypertension 01/04/2007    ONSET DATE: About June 2023 (~3.5 months)  REFERRING DIAG: R29.898 (ICD-10-CM) - Bilateral arm weakness  THERAPY DIAG:  Acute pain of left shoulder  Stiffness of left shoulder, not elsewhere classified  Stiffness of left wrist, not elsewhere classified  Pain in right  wrist  Stiffness of right shoulder, not elsewhere classified  Stiffness of right wrist, not elsewhere classified  Other lack of coordination  Muscle weakness (generalized)  Rationale for Evaluation and Treatment Rehabilitation  PERTINENT HISTORY: Per MD orders: "BUE general strengthening. 2x/wk for 6wks." She also has hx of Rt shoulder pain.  From eval: "She is is retired, She states some memory issues but that she thinks that in June this year, she started having neck pain and b/l shoulder pain R > L. She states she lost ability to raise her arms, and pain and stiffness seemed to travel down her arms into her hands. She states her two main issues now are painful stiff shoulders and wrist/hands R > L. She states having MRI and EMG and having no significant results. "  PRECAUTIONS: None  WEIGHT BEARING RESTRICTIONS No   SUBJECTIVE:   SUBJECTIVE STATEMENT: She states achy and sore today, but this is a usual report for her.  Overall she does say she feels much better.   PAIN:  Are you having pain? Yes  Rating: 3/10 she states rt hand knuckles more sore today    OBJECTIVE: (All objective assessments below are from initial evaluation on: 12/08/21 unless otherwise specified.)   HAND DOMINANCE: Right   ADLs: Overall ADLs: States decreased ability to grab, hold household objects, pain and inability to open containers, perform FMS tasks (manipulate fasteners on clothing), mild to moderate bathing problems as well.    FUNCTIONAL OUTCOME MEASURES: Eval: Quck DASH 43% impairment today  (Higher % Score  =  More Impairment)     UPPER EXTREMITY ROM     Shoulder to Wrist AROM Right eval Left eval Right  /  Left 12/29/21  Shoulder flexion 132 113 149  /  130  Shoulder abduction 132 91 pain 144  /  136  Shoulder extension     Shoulder internal rotation Rear hip Lateral hip L1  /  L4  Shoulder external rotation     Elbow flexion full full   Elbow extension full full   Forearm  supination full full   Forearm pronation  full full   Wrist flexion 62 45 53  /  43  Wrist extension 65 68 55  /  58  (Blank rows = not tested)    UPPER EXTREMITY MMT:     MMT Right TBD Left TBD  Shoulder flexion    Shoulder abduction    Shoulder adduction    Shoulder extension    Shoulder internal rotation    Shoulder external rotation    Middle trapezius    Lower trapezius    Elbow flexion    Elbow extension    Forearm supination  Forearm pronation    Wrist flexion    Wrist extension    Wrist ulnar deviation    Wrist radial deviation    (Blank rows = not tested)  HAND FUNCTION: 12/29/21: Rt grip 27 #, Lt grip 21 #  Eval: Observed weakness in affected hand.  Grip strength Right: 26 lbs, Left: 16 lbs   COORDINATION: 12/20/21: 9 Hole Peg Test  Right: 28sec, Left: 26 sec  Eval: Observed coordination difficulties with both hands and Lt > Rt shoulder.    SENSATION: Eval: Light touch intact today (also states "negative" EMG findings)   EDEMA:   Eval: none significant   COGNITION: Eval: Overall cognitive status: WFL for evaluation today, though mild memory issues   OBSERVATIONS:   Eval: She seemed to have better motion in Rt dom shoulder and arm today, Lt looked like impinging at 90* abd, painful. Both hands seemed to be sluggish motion and poor coordination. She describes something like a learned helplessness caused by pain that kept her in bed a times, resulted in decreased motion and function with distal UEs, likely exacerbating issues.     TODAY'S TREATMENT:  01/05/22: For increase finger pain c/o, OT edu on finger hook stretches and does with her, she states non-painful, feels good. (Added to HEP for management). She then reviews/performs table towel stretches in flexion, abd, ER (concurrent with MH on b/l shoulders which she states feels very nice). She uses dowel and does AAROM in flex, abd, before moving on to functional activities as below for fnl endurance  and ability:  -Farmer's Carry 10# b/l x60 feet x 3 -floor to waist fnl reaching with 5# x6 -waist to shoulder height with 5# x6    01/03/22: She uses UBE 1.0 resistance for 4 mins to get AROM through her b/l UEs dynamically (~40RPM).  She then lies down to stretch shoulders and do AA/PROM in flex, horizontal abd.  In lying. She states good stretches but also that she prefers table slides as she doesn't lie in bed during the day. She sits at edge of plinth to review and perform sh IR stretches, wrist stretches followed by bicep/wrist flexion and sh ER/ wrist ext strength. In standing She reviews shoulder AAROM in flex, ext, abd for strength and OT upgrades her from 1# dowel to 2# dowel when she does. She states "tired and worn out" at the end, but smiling and doing well.     PATIENT EDUCATION: Education details: See tx section above for details  Person educated: Patient Education method: Verbal Instruction, Teach back, Handouts  Education comprehension: States and demonstrates understanding, Additional Education required    HOME EXERCISE PROGRAM: Access Code: [email or web address] URL: [email or web address]   GOALS: Goals reviewed with patient? Yes   SHORT TERM GOALS: (STG required if POC>30 days) Target Date: 12/23/21   Pt will demo/state understanding of initial HEP to improve pain levels and prerequisite motion. Goal status: 12/22/21: MET   LONG TERM GOALS: Target Date: 01/20/22  Pt will improve functional ability by decreased impairment per Quick DASH assessment from 43% to 20% or better, for better quality of life. Goal status: INITIAL  2.  Pt will improve grip strength in Lt hand from 16lbs to at least 25#lbs for functional use at home and in IADLs. Goal status: INITIAL  3.  Pt will improve A/ROM in Lt sh abd from 90* painful to at least 125*, to have functional motion for tasks like reach and grasp.  Goal status: Progressing   4.  Pt will improve strength in b/l  shoulder flexion from 3-/5 MMT to at least 4+/5 MMT to have increased functional ability to carry out selfcare and higher-level homecare tasks with no difficulty. Goal status: INITIAL  5.  Pt will improve coordination skills in b/l hands, as seen by better score on 9HPT testing from TBD to TBD or better to have increased functional ability to carry out fine motor tasks (fasteners, etc.) and more complex, coordinated IADLs (meal prep, sports, etc.).  Goal status: N/A - FMS seem fairly equal b/l and this goal is d/c now (12/20/21  6.  Pt will decrease pain at worst from 6-7/10 to 2/10 or better to have better sleep and occupational participation in daily roles. Goal status: INITIAL   ASSESSMENT:  CLINICAL IMPRESSION: 01/05/22: She is doing so well now that we will back down to once a week to see if she can self manage and perform exercises at home more often for generalization of self-care skills.  She may be seen more often if she regresses.  01/03/22: She continues to do well and tolerate treatments well.  OT will start to incorporate fnl activities into session, now that her motion and strength have improved.    PLAN: OT FREQUENCY: 2x/week  OT DURATION: 6 weeks (through 01/20/22)   PLANNED INTERVENTIONS: self care/ADL training, therapeutic exercise, therapeutic activity, neuromuscular re-education, manual therapy, passive range of motion, splinting, electrical stimulation, ultrasound, fluidotherapy, moist heat, cryotherapy, contrast bath, patient/family education, energy conservation, and coping strategies training  RECOMMENDED OTHER SERVICES: none now   CONSULTED AND AGREED WITH PLAN OF CARE: Patient  PLAN FOR NEXT SESSION:  Check her progress after a week to more functional activities     [employer], OTR/L, CHT 01/05/2022, 3:12 PM

## 2022-01-05 ENCOUNTER — Ambulatory Visit (INDEPENDENT_AMBULATORY_CARE_PROVIDER_SITE_OTHER): Payer: Medicare Other | Admitting: Rehabilitative and Restorative Service Providers"

## 2022-01-05 ENCOUNTER — Encounter: Payer: Self-pay | Admitting: Rehabilitative and Restorative Service Providers"

## 2022-01-05 DIAGNOSIS — R278 Other lack of coordination: Secondary | ICD-10-CM

## 2022-01-05 DIAGNOSIS — M25512 Pain in left shoulder: Secondary | ICD-10-CM | POA: Diagnosis not present

## 2022-01-05 DIAGNOSIS — M25531 Pain in right wrist: Secondary | ICD-10-CM

## 2022-01-05 DIAGNOSIS — M25631 Stiffness of right wrist, not elsewhere classified: Secondary | ICD-10-CM

## 2022-01-05 DIAGNOSIS — M25612 Stiffness of left shoulder, not elsewhere classified: Secondary | ICD-10-CM

## 2022-01-05 DIAGNOSIS — M25611 Stiffness of right shoulder, not elsewhere classified: Secondary | ICD-10-CM

## 2022-01-05 DIAGNOSIS — M25632 Stiffness of left wrist, not elsewhere classified: Secondary | ICD-10-CM | POA: Diagnosis not present

## 2022-01-05 DIAGNOSIS — M6281 Muscle weakness (generalized): Secondary | ICD-10-CM

## 2022-01-09 NOTE — Therapy (Signed)
OUTPATIENT OCCUPATIONAL THERAPY TREATMENT NOTE  Patient Name: April Donaldson MRN: [id number] DOB:01-20-1932, 88 y.o., female Today's Date: 01/05/2022  PCP: Pricilla Holm, MD REFERRING PROVIDER: Meredith Pel, MD  OT End of Session - 01/05/22 1151     Visit Number 8    Number of Visits 12    Date for OT Re-Evaluation 01/20/22    Authorization Type [employer] Medicare    Progress Note Due on Visit 10    OT Start Time 1149    OT Stop Time 1229    OT Time Calculation (min) 40 min    Activity Tolerance Patient tolerated treatment well;No increased pain;Patient limited by fatigue    Behavior During Therapy [facility] for tasks assessed/performed              Past Medical History:  Diagnosis Date   Anxiety    Benign neoplasm of kidney, except pelvis    Cellulitis of leg 03/09/2013 hosp    Cerebrovascular disease    Chronic low back pain    Diabetes mellitus without complication ([place]) 09/21/3662   Diastolic CHF (HCC)    Dr Daneen Schick   Difficult intubation    per patient with hand surgery in 2016 at surgical center- lip was pinched and swollen after surgery- patient states dr Burney Gauze never stated a problem with intubation    Diverticulosis    DJD (degenerative joint disease)    Dyspnea    with exertion    GERD (gastroesophageal reflux disease)    no meds   Glaucoma    Gout    H/O hiatal hernia    Hepatitis    hx of years ago    Hx of colonic polyps    Hypercholesteremia    Hypertension    Hypothyroid    IBS (irritable bowel syndrome)    Internal hemorrhoids    Obesity    OSA (obstructive sleep apnea)    NPSG 2009: AHI 20/h. CPAP - follows with [facility]   Renal insufficiency    one kidney small and non funtioning   Tubular adenoma 2000   Venous insufficiency    Past Surgical History:  Procedure Laterality Date   ABDOMINAL HYSTERECTOMY     APPENDECTOMY     CATARACT EXTRACTION, BILATERAL Bilateral 2017   cataracts Bilateral    COLONOSCOPY      CYSTOSCOPY/RETROGRADE/URETEROSCOPY Bilateral 04/09/2017   Procedure: CYSTOSCOPY/RETROGRADE;  Surgeon: Raynelle Bring, MD;  Location: [facility];  Service: Urology;  Laterality: Bilateral;  ONLY NEEDS 30 MIN FOR PROCEDURE   DILATION AND CURETTAGE OF UTERUS     FUNCTIONAL ENDOSCOPIC SINUS SURGERY  05/2003   Dr. Ernesto Rutherford   hysterctomy and ooperectomy  1970's   MASS EXCISION  2003   lt   MASS EXCISION Left 09/17/2013   Procedure: EXCISION MASS LEFT HAND;  Surgeon: Schuyler Amor, MD;  Location: [place];  Service: Orthopedics;  Laterality: Left;   thyroid lobectomy for a cyst  10/2000   Dr. Ernesto Rutherford   TONSILLECTOMY     Patient Active Problem List   Diagnosis Date Noted   Allergic reaction 11/09/2021   Acquired deformity of lower leg 10/25/2021   Leg swelling 10/25/2021   Anemia 10/25/2021   Bilateral shoulder pain 08/12/2021   Cervical pain (neck) 07/28/2021   Hypokalemia 05/27/2021   Bilateral carotid artery stenosis 03/25/2021   Grade II diastolic dysfunction 40/34/7425   Chest pain 02/11/2021   Dyspnea 95/63/8756   Systolic murmur 43/32/9518   Numbness and tingling in  left hand 06/26/2019   Left shoulder pain 11/28/2018   Fatigue 02/08/2018   Candidal dermatitis 07/21/2016   Bilateral pseudophakia 04/10/2016   Routine general medical examination at a health care facility 06/15/2014   Primary open angle glaucoma of both eyes, mild stage 07/10/2011   Obstructive sleep apnea 11/11/2007   GLAUCOMA 09/22/2007   CKD (chronic kidney disease) stage 3, GFR 30-59 ml/min ([place]) 03/21/2007   Hypothyroidism 01/04/2007   Hyperlipidemia 01/04/2007   GOUT 01/04/2007   Essential hypertension 01/04/2007    ONSET DATE: About June 2023 (~3.5 months)  REFERRING DIAG: R29.898 (ICD-10-CM) - Bilateral arm weakness  THERAPY DIAG:  Acute pain of left shoulder  Stiffness of left shoulder, not elsewhere classified  Stiffness of left wrist, not elsewhere classified  Pain in right  wrist  Stiffness of right shoulder, not elsewhere classified  Stiffness of right wrist, not elsewhere classified  Other lack of coordination  Muscle weakness (generalized)  Rationale for Evaluation and Treatment Rehabilitation  PERTINENT HISTORY: Per MD orders: "BUE general strengthening. 2x/wk for 6wks." She also has hx of Rt shoulder pain.  From eval: "She is is retired, She states some memory issues but that she thinks that in June this year, she started having neck pain and b/l shoulder pain R > L. She states she lost ability to raise her arms, and pain and stiffness seemed to travel down her arms into her hands. She states her two main issues now are painful stiff shoulders and wrist/hands R > L. She states having MRI and EMG and having no significant results. "  PRECAUTIONS: None  WEIGHT BEARING RESTRICTIONS No   SUBJECTIVE:   SUBJECTIVE STATEMENT: She states ***  achy and sore today, but this is a usual report for her.  Overall she does say she feels much better.   PAIN:  Are you having pain? Yes *** Rating: 3/10 she states rt hand knuckles more sore today    OBJECTIVE: (All objective assessments below are from initial evaluation on: 12/08/21 unless otherwise specified.)   HAND DOMINANCE: Right   ADLs: Overall ADLs: States decreased ability to grab, hold household objects, pain and inability to open containers, perform FMS tasks (manipulate fasteners on clothing), mild to moderate bathing problems as well.    FUNCTIONAL OUTCOME MEASURES: Eval: Quck DASH 43% impairment today  (Higher % Score  =  More Impairment)     UPPER EXTREMITY ROM     Shoulder to Wrist AROM Right eval Left eval Right  /  Left 12/29/21  Shoulder flexion 132 113 149  /  130  Shoulder abduction 132 91 pain 144  /  136  Shoulder extension     Shoulder internal rotation Rear hip Lateral hip L1  /  L4  Shoulder external rotation     Elbow flexion full full   Elbow extension full full    Forearm supination full full   Forearm pronation  full full   Wrist flexion 62 45 53  /  43  Wrist extension 65 68 55  /  58  (Blank rows = not tested)    UPPER EXTREMITY MMT:     MMT Right TBD Left TBD  Shoulder flexion    Shoulder abduction    Shoulder adduction    Shoulder extension    Shoulder internal rotation    Shoulder external rotation    Middle trapezius    Lower trapezius    Elbow flexion    Elbow extension  Forearm supination    Forearm pronation    Wrist flexion    Wrist extension    Wrist ulnar deviation    Wrist radial deviation    (Blank rows = not tested)  HAND FUNCTION: 12/29/21: Rt grip 27 #, Lt grip 21 #  Eval: Observed weakness in affected hand.  Grip strength Right: 26 lbs, Left: 16 lbs   COORDINATION: 12/20/21: 9 Hole Peg Test  Right: 28sec, Left: 26 sec  Eval: Observed coordination difficulties with both hands and Lt > Rt shoulder.    SENSATION: Eval: Light touch intact today (also states "negative" EMG findings)   EDEMA:   Eval: none significant   COGNITION: Eval: Overall cognitive status: WFL for evaluation today, though mild memory issues   OBSERVATIONS:   Eval: She seemed to have better motion in Rt dom shoulder and arm today, Lt looked like impinging at 90* abd, painful. Both hands seemed to be sluggish motion and poor coordination. She describes something like a learned helplessness caused by pain that kept her in bed a times, resulted in decreased motion and function with distal UEs, likely exacerbating issues.     TODAY'S TREATMENT:  01/10/22: ***Check her progress after a week to more functional activities  -Farmer's Carry 10# b/l x60 feet x 3 -floor to waist fnl reaching with 5# x6 -waist to shoulder height with 5# x6   01/05/22: For increase finger pain c/o, OT edu on finger hook stretches and does with her, she states non-painful, feels good. (Added to HEP for management). She then reviews/performs table towel  stretches in flexion, abd, ER (concurrent with MH on b/l shoulders which she states feels very nice). She uses dowel and does AAROM in flex, abd, before moving on to functional activities as below for fnl endurance and ability:  -Farmer's Carry 10# b/l x60 feet x 3 -floor to waist fnl reaching with 5# x6 -waist to shoulder height with 5# x6  01/03/22: She uses UBE 1.0 resistance for 4 mins to get AROM through her b/l UEs dynamically (~40RPM).  She then lies down to stretch shoulders and do AA/PROM in flex, horizontal abd.  In lying. She states good stretches but also that she prefers table slides as she doesn't lie in bed during the day. She sits at edge of plinth to review and perform sh IR stretches, wrist stretches followed by bicep/wrist flexion and sh ER/ wrist ext strength. In standing She reviews shoulder AAROM in flex, ext, abd for strength and OT upgrades her from 1# dowel to 2# dowel when she does. She states "tired and worn out" at the end, but smiling and doing well.   PATIENT EDUCATION: Education details: See tx section above for details  Person educated: Patient Education method: Verbal Instruction, Teach back, Handouts  Education comprehension: States and demonstrates understanding, Additional Education required    HOME EXERCISE PROGRAM: Access Code: [email or web address] URL: [email or web address]   GOALS: Goals reviewed with patient? Yes   SHORT TERM GOALS: (STG required if POC>30 days) Target Date: 12/23/21   Pt will demo/state understanding of initial HEP to improve pain levels and prerequisite motion. Goal status: 12/22/21: MET   LONG TERM GOALS: Target Date: 01/20/22  Pt will improve functional ability by decreased impairment per Quick DASH assessment from 43% to 20% or better, for better quality of life. Goal status: INITIAL  2.  Pt will improve grip strength in Lt hand from 16lbs to at least 25#lbs for functional use at home and in IADLs.  Goal status:  INITIAL  3.  Pt will improve A/ROM in Lt sh abd from 90* painful to at least 125*, to have functional motion for tasks like reach and grasp.  Goal status: Progressing   4.  Pt will improve strength in b/l shoulder flexion from 3-/5 MMT to at least 4+/5 MMT to have increased functional ability to carry out selfcare and higher-level homecare tasks with no difficulty. Goal status: INITIAL  5.  Pt will improve coordination skills in b/l hands, as seen by better score on 9HPT testing from TBD to TBD or better to have increased functional ability to carry out fine motor tasks (fasteners, etc.) and more complex, coordinated IADLs (meal prep, sports, etc.).  Goal status: N/A - FMS seem fairly equal b/l and this goal is d/c now (12/20/21  6.  Pt will decrease pain at worst from 6-7/10 to 2/10 or better to have better sleep and occupational participation in daily roles. Goal status: INITIAL   ASSESSMENT:  CLINICAL IMPRESSION: 01/10/22: ***  01/05/22: She is doing so well now that we will back down to once a week to see if she can self manage and perform exercises at home more often for generalization of self-care skills.  She may be seen more often if she regresses.  01/03/22: She continues to do well and tolerate treatments well.  OT will start to incorporate fnl activities into session, now that her motion and strength have improved.    PLAN: OT FREQUENCY: 2x/week  OT DURATION: 6 weeks (through 01/20/22)   PLANNED INTERVENTIONS: self care/ADL training, therapeutic exercise, therapeutic activity, neuromuscular re-education, manual therapy, passive range of motion, splinting, electrical stimulation, ultrasound, fluidotherapy, moist heat, cryotherapy, contrast bath, patient/family education, energy conservation, and coping strategies training  RECOMMENDED OTHER SERVICES: none now   CONSULTED AND AGREED WITH PLAN OF CARE: Patient  PLAN FOR NEXT SESSION:  *** Reassess next visit at 10th  visit  Benito Mccreedy, OTR/L, CHT 01/05/2022, 3:12 PM

## 2022-01-10 ENCOUNTER — Ambulatory Visit (INDEPENDENT_AMBULATORY_CARE_PROVIDER_SITE_OTHER): Payer: Medicare Other | Admitting: Rehabilitative and Restorative Service Providers"

## 2022-01-10 ENCOUNTER — Encounter: Payer: Self-pay | Admitting: Rehabilitative and Restorative Service Providers"

## 2022-01-10 DIAGNOSIS — M25612 Stiffness of left shoulder, not elsewhere classified: Secondary | ICD-10-CM | POA: Diagnosis not present

## 2022-01-10 DIAGNOSIS — M25512 Pain in left shoulder: Secondary | ICD-10-CM | POA: Diagnosis not present

## 2022-01-10 DIAGNOSIS — M6281 Muscle weakness (generalized): Secondary | ICD-10-CM

## 2022-01-10 DIAGNOSIS — M25631 Stiffness of right wrist, not elsewhere classified: Secondary | ICD-10-CM | POA: Diagnosis not present

## 2022-01-10 DIAGNOSIS — M25611 Stiffness of right shoulder, not elsewhere classified: Secondary | ICD-10-CM

## 2022-01-10 DIAGNOSIS — M25632 Stiffness of left wrist, not elsewhere classified: Secondary | ICD-10-CM | POA: Diagnosis not present

## 2022-01-10 DIAGNOSIS — M25531 Pain in right wrist: Secondary | ICD-10-CM

## 2022-01-10 DIAGNOSIS — R278 Other lack of coordination: Secondary | ICD-10-CM

## 2022-01-13 ENCOUNTER — Other Ambulatory Visit: Payer: Self-pay | Admitting: Internal Medicine

## 2022-01-17 ENCOUNTER — Encounter: Payer: Self-pay | Admitting: Rehabilitative and Restorative Service Providers"

## 2022-01-17 ENCOUNTER — Ambulatory Visit (INDEPENDENT_AMBULATORY_CARE_PROVIDER_SITE_OTHER): Payer: Medicare Other | Admitting: Rehabilitative and Restorative Service Providers"

## 2022-01-17 DIAGNOSIS — M25531 Pain in right wrist: Secondary | ICD-10-CM

## 2022-01-17 DIAGNOSIS — M6281 Muscle weakness (generalized): Secondary | ICD-10-CM

## 2022-01-17 DIAGNOSIS — M25631 Stiffness of right wrist, not elsewhere classified: Secondary | ICD-10-CM | POA: Diagnosis not present

## 2022-01-17 DIAGNOSIS — R278 Other lack of coordination: Secondary | ICD-10-CM

## 2022-01-17 DIAGNOSIS — M25611 Stiffness of right shoulder, not elsewhere classified: Secondary | ICD-10-CM

## 2022-01-17 DIAGNOSIS — M25612 Stiffness of left shoulder, not elsewhere classified: Secondary | ICD-10-CM | POA: Diagnosis not present

## 2022-01-17 DIAGNOSIS — M25512 Pain in left shoulder: Secondary | ICD-10-CM

## 2022-01-17 DIAGNOSIS — M25632 Stiffness of left wrist, not elsewhere classified: Secondary | ICD-10-CM

## 2022-01-17 NOTE — Therapy (Signed)
OUTPATIENT OCCUPATIONAL THERAPY TREATMENT AND DISCHARGE NOTE  Patient Name: April Donaldson MRN: [id number] DOB:01/08/1932, 88 y.o., female Today's Date: 01/17/2022  PCP: Pricilla Holm, MD REFERRING PROVIDER: Meredith Pel, MD  OT End of Session - 01/17/22 1137     Visit Number 10    Number of Visits 12    Date for OT Re-Evaluation 01/20/22    Authorization Type [employer] Medicare    Progress Note Due on Visit 10    OT Start Time 1139    OT Stop Time 1234    OT Time Calculation (min) 55 min    Activity Tolerance Patient tolerated treatment well;No increased pain;Patient limited by fatigue    Behavior During Therapy [facility] for tasks assessed/performed                Past Medical History:  Diagnosis Date   Anxiety    Benign neoplasm of kidney, except pelvis    Cellulitis of leg 03/09/2013 hosp    Cerebrovascular disease    Chronic low back pain    Diabetes mellitus without complication ([place]) 04/20/9796   Diastolic CHF (HCC)    Dr Daneen Schick   Difficult intubation    per patient with hand surgery in 2016 at surgical center- lip was pinched and swollen after surgery- patient states dr Burney Gauze never stated a problem with intubation    Diverticulosis    DJD (degenerative joint disease)    Dyspnea    with exertion    GERD (gastroesophageal reflux disease)    no meds   Glaucoma    Gout    H/O hiatal hernia    Hepatitis    hx of years ago    Hx of colonic polyps    Hypercholesteremia    Hypertension    Hypothyroid    IBS (irritable bowel syndrome)    Internal hemorrhoids    Obesity    OSA (obstructive sleep apnea)    NPSG 2009: AHI 20/h. CPAP - follows with [facility]   Renal insufficiency    one kidney small and non funtioning   Tubular adenoma 2000   Venous insufficiency    Past Surgical History:  Procedure Laterality Date   ABDOMINAL HYSTERECTOMY     APPENDECTOMY     CATARACT EXTRACTION, BILATERAL Bilateral 2017   cataracts Bilateral    COLONOSCOPY      CYSTOSCOPY/RETROGRADE/URETEROSCOPY Bilateral 04/09/2017   Procedure: CYSTOSCOPY/RETROGRADE;  Surgeon: Raynelle Bring, MD;  [facility];  Service: Urology;  Laterality: Bilateral;  ONLY NEEDS 30 MIN FOR PROCEDURE   DILATION AND CURETTAGE OF UTERUS     FUNCTIONAL ENDOSCOPIC SINUS SURGERY  05/2003   Dr. Ernesto Rutherford   hysterctomy and ooperectomy  1970's   MASS EXCISION  2003   lt   MASS EXCISION Left 09/17/2013   Procedure: EXCISION MASS LEFT HAND;  Surgeon: Schuyler Amor, MD;  Location: [place];  Service: Orthopedics;  Laterality: Left;   thyroid lobectomy for a cyst  10/2000   Dr. Ernesto Rutherford   TONSILLECTOMY     Patient Active Problem List   Diagnosis Date Noted   Allergic reaction 11/09/2021   Acquired deformity of lower leg 10/25/2021   Leg swelling 10/25/2021   Anemia 10/25/2021   Bilateral shoulder pain 08/12/2021   Cervical pain (neck) 07/28/2021   Hypokalemia 05/27/2021   Bilateral carotid artery stenosis 03/25/2021   Grade II diastolic dysfunction 92/12/9415   Chest pain 02/11/2021   Dyspnea 40/81/4481   Systolic murmur 85/63/1497  Numbness and tingling in left hand 06/26/2019   Left shoulder pain 11/28/2018   Fatigue 02/08/2018   Candidal dermatitis 07/21/2016   Bilateral pseudophakia 04/10/2016   Routine general medical examination at a health care facility 06/15/2014   Primary open angle glaucoma of both eyes, mild stage 07/10/2011   Obstructive sleep apnea 11/11/2007   GLAUCOMA 09/22/2007   CKD (chronic kidney disease) stage 3, GFR 30-59 ml/min ([place]) 03/21/2007   Hypothyroidism 01/04/2007   Hyperlipidemia 01/04/2007   GOUT 01/04/2007   Essential hypertension 01/04/2007    ONSET DATE: About June 2023 (~3.5 months)  REFERRING DIAG: R29.898 (ICD-10-CM) - Bilateral arm weakness  THERAPY DIAG:  Acute pain of left shoulder  Stiffness of right wrist, not elsewhere classified  Stiffness of left shoulder, not elsewhere classified  Muscle  weakness (generalized)  Stiffness of right shoulder, not elsewhere classified  Pain in right wrist  Stiffness of left wrist, not elsewhere classified  Other lack of coordination  Rationale for Evaluation and Treatment Rehabilitation  PERTINENT HISTORY: Per MD orders: "BUE general strengthening. 2x/wk for 6wks." She also has hx of Rt shoulder pain.  From eval: "She is is retired, She states some memory issues but that she thinks that in June this year, she started having neck pain and b/l shoulder pain R > L. She states she lost ability to raise her arms, and pain and stiffness seemed to travel down her arms into her hands. She states her two main issues now are painful stiff shoulders and wrist/hands R > L. She states having MRI and EMG and having no significant results. "  PRECAUTIONS: None  WEIGHT BEARING RESTRICTIONS No   SUBJECTIVE:   SUBJECTIVE STATEMENT: She states "trying to" do her home exercises, though she feels sedate especially with the colder weather.  She states knowing that she is doing much better now than during initial evaluation.  PAIN:  Are you having pain?  No significant pain right now Rating: 0-1/10 now, up to 2/10 in past week ,just aching   OBJECTIVE: (All objective assessments below are from initial evaluation on: 12/08/21 unless otherwise specified.)   HAND DOMINANCE: Right   ADLs: Overall ADLs: No to mild complaints now with all home activities.   FUNCTIONAL OUTCOME MEASURES: 01/17/22: Quick DASH 16% Impairment today   Eval: Quck DASH 43% impairment today  (Higher % Score  =  More Impairment)     UPPER EXTREMITY ROM     Shoulder to Wrist AROM Right eval Left eval Right   /   Left 01/17/22  Shoulder flexion 132 113 121   /   126  Shoulder abduction 132 91 pain 140   /   138  Shoulder extension   35   /   42  Shoulder internal rotation Rear hip Lateral hip T11   /   T10  Shoulder external rotation   67   /   69  Elbow flexion full full  WNL  Elbow extension full full WNL  Forearm supination full full WNL  Forearm pronation  full full WNL  Wrist flexion 62 45 38   /   25  Wrist extension 65 68 67   /   71  (Blank rows = not tested)    UPPER EXTREMITY MMT:     MMT Right 01/17/22 Left 01/17/22  Shoulder flexion 4/5 4/5  Shoulder abduction 4/5 4/5  Wrist flexion 4+/5 4+/5  Wrist extension 4+/5 4+/5  (Blank rows = not tested)  HAND FUNCTION: 01/17/22: Rt: 21#  Lt: 19#  01/10/22:  Rt Grip: 21#  Lt Grip: 25#  12/29/21: Rt grip 27 #, Lt grip 21 #  Eval: Observed weakness in affected hand.  Grip strength Right: 26 lbs, Left: 16 lbs   COORDINATION: 12/20/21: 9 Hole Peg Test  Right: 28sec, Left: 26 sec  Eval: Observed coordination difficulties with both hands and Lt > Rt shoulder.    OBSERVATIONS:   01/17/22: Much more mobile, no signs of impingement.  Wrist hands and arms doing much better than initial evaluation, but right wrist was a bit tight today and both hands a bit measuring weaker, likely due to colder weather today  Eval: She seemed to have better motion in Rt dom shoulder and arm today, Lt looked like impinging at 90* abd, painful. Both hands seemed to be sluggish motion and poor coordination. She describes something like a learned helplessness caused by pain that kept her in bed a times, resulted in decreased motion and function with distal UEs, likely exacerbating issues.     TODAY'S TREATMENT:  01/17/22: Pt performs AROM, gripping, and strength with b/l arms/hands against resistance for exercise/activities as well as new measures today. Using that data, OT also reviews home exercises and provides appropriate upgrades as below. OT also discusses home and functional tasks with the pt and reviews goals. Pt states understanding and tolerates upgrades well.  She was highly encouraged to remain active as she states being reclusive, not having a social group, and wanting to just lie in bed at times.  She was  warned that that would lead towards worsening health.  Exercises - Seated Scapular Retraction  - 2- 4 x daily - 5-10 reps - 5 seconds hold - Standing Shoulder Flexion Wall Walk  - 1-3 x daily - 3-4 reps - 15 sec hold - Standing Shoulder Internal Rotation Stretch Behind Back  - 4-6 x daily - 3-5 reps - 15 sec hold - Wrist Flexion Stretch  - 4 x daily - 3-5 reps - 15 sec hold - Wrist Prayer Stretch  - 4 x daily - 3-5 reps - 15 sec hold - Standing Shoulder Extension with Dowel  - 4-6 x daily - 1 sets - 10-15 reps - Seated Shoulder Flexion AAROM with Dowel  - 4-6 x daily - 1 sets - 10-15 reps - Standing Shoulder Abduction AAROM with Dowel  - 4-6 x daily - 1 sets - 10-15 reps - Full Fist  - 2-3 x daily - 5 reps - Finger Pinch and Pull with Putty  - 2-3 x daily - 5 reps - Shoulder External Rotation with Resistance  - 1-2 x daily - 1-2 sets - 5-10 reps - Seated Single Arm Elbow Flexion with Resistance  - 1-2 x daily - 10 reps   PATIENT EDUCATION: Education details: See tx section above for details  Person educated: Patient Education method: Verbal Instruction, Teach back, Handouts  Education comprehension: States and demonstrates understanding, Additional Education required    HOME EXERCISE PROGRAM: Access Code: [email or web address] URL: [email or web address]   GOALS: Goals reviewed with patient? Yes   SHORT TERM GOALS: (STG required if POC>30 days) Target Date: 12/23/21   Pt will demo/state understanding of initial HEP to improve pain levels and prerequisite motion. Goal status: 12/22/21: MET   LONG TERM GOALS: Target Date: 01/20/22  Pt will improve functional ability by decreased impairment per Quick DASH assessment from 43% to 20% or better, for better quality of life. Goal status: 01/17/22:  MET 16% today  2.  Pt will improve grip strength in Lt hand from 16lbs to at least 25#lbs for functional use at home and in IADLs. Goal status: 01/17/22: Progressing- 19#  3.  Pt  will improve A/ROM in Lt sh abd from 90* painful to at least 125*, to have functional motion for tasks like reach and grasp.  Goal status: 01/17/22: MET 138*   4.  Pt will improve strength in b/l shoulder flexion from 3-/5 MMT to at least 4+/5 MMT to have increased functional ability to carry out selfcare and higher-level homecare tasks with no difficulty. Goal status: 01/17/22: Progressing, at least 4/5 now, no pains  5.  Pt will improve coordination skills in b/l hands, as seen by better score on 9HPT testing from TBD to TBD or better to have increased functional ability to carry out fine motor tasks (fasteners, etc.) and more complex, coordinated IADLs (meal prep, sports, etc.).  Goal status: N/A - FMS seem fairly equal b/l and this goal is d/c now (12/20/21  6.  Pt will decrease pain at worst from 6-7/10 to 2/10 or better to have better sleep and occupational participation in daily roles. Goal status: 01/17/22: MET now    ASSESSMENT:  CLINICAL IMPRESSION: 01/17/22: She met most goals today, and made progress toward others that were not met.  She has much more functional ability per QuickDASH, little to no pain now even for a week or more.  She has a full home exercise program and states understanding it and just needing to do it.  OT encourages her to stay active, agrees that she is fit for discharge today, but reminds her that she could return to therapy as needed in the future.   PLAN: OT FREQUENCY: Discharge  OT DURATION: Discharge  PLANNED INTERVENTIONS: self care/ADL training, therapeutic exercise, therapeutic activity, neuromuscular re-education, manual therapy, passive range of motion, splinting, electrical stimulation, ultrasound, fluidotherapy, moist heat, cryotherapy, contrast bath, patient/family education, energy conservation, and coping strategies training  RECOMMENDED OTHER SERVICES: none now   CONSULTED AND AGREED WITH PLAN OF CARE: Patient  PLAN FOR NEXT SESSION:   Discharge successfully today  Benito Mccreedy, OTR/L, CHT 01/17/2022, 1:33 PM     OCCUPATIONAL THERAPY DISCHARGE SUMMARY  Visits from Start of Care: 10  Current functional level related to goals / functional outcomes: Pt has met all goals to satisfactory levels and is pleased with outcomes.   Remaining deficits: Pt has no more significant functional deficits or pain.   Education / Equipment: Pt has all needed materials and education. Pt understands how to continue on with self-management. See tx notes for more details.   Patient agrees to discharge due to max benefits received from outpatient occupational therapy / hand therapy at this time.   Benito Mccreedy, OTR/L, CHT 01/17/22

## 2022-01-24 ENCOUNTER — Ambulatory Visit (INDEPENDENT_AMBULATORY_CARE_PROVIDER_SITE_OTHER): Payer: Medicare Other

## 2022-01-24 VITALS — Ht 59.0 in | Wt 167.0 lb

## 2022-01-24 DIAGNOSIS — Z0000 Encounter for general adult medical examination without abnormal findings: Secondary | ICD-10-CM

## 2022-01-24 NOTE — Patient Instructions (Signed)
[name] , Thank you for taking time to come for your Medicare Wellness Visit. I appreciate your ongoing commitment to your health goals. Please review the following plan we discussed and let me know if I can assist you in the future.   These are the goals we discussed:  Goals      Client understands the importance of follow-up with providers by attending scheduled visits        This is a list of the screening recommended for you and due dates:  Health Maintenance  Topic Date Due   COVID-19 Vaccine (5 - 2023-24 season) 03/15/2022   Medicare Annual Wellness Visit  01/25/2023   Pneumonia Vaccine  Completed   Flu Shot  Completed   DEXA scan (bone density measurement)  Completed   HPV Vaccine  Aged Out   Zoster (Shingles) Vaccine  Discontinued    Advanced directives: Yes  Conditions/risks identified: Yes  Next appointment: Follow up in one year for your annual wellness visit.   Preventive Care 52 Years and Older, Female Preventive care refers to lifestyle choices and visits with your health care provider that can promote health and wellness. What does preventive care include? A yearly physical exam. This is also called an annual well check. Dental exams once or twice a year. Routine eye exams. Ask your health care provider how often you should have your eyes checked. Personal lifestyle choices, including: Daily care of your teeth and gums. Regular physical activity. Eating a healthy diet. Avoiding tobacco and drug use. Limiting alcohol use. Practicing safe sex. Taking low-dose aspirin every day. Taking vitamin and mineral supplements as recommended by your health care provider. What happens during an annual well check? The services and screenings done by your health care provider during your annual well check will depend on your age, overall health, lifestyle risk factors, and family history of disease. Counseling  Your health care provider may ask you questions about  your: Alcohol use. Tobacco use. Drug use. Emotional well-being. Home and relationship well-being. Sexual activity. Eating habits. History of falls. Memory and ability to understand (cognition). Work and work Statistician. Reproductive health. Screening  You may have the following tests or measurements: Height, weight, and BMI. Blood pressure. Lipid and cholesterol levels. These may be checked every 5 years, or more frequently if you are over 76 years old. Skin check. Lung cancer screening. You may have this screening every year starting at age 59 if you have a 30-pack-year history of smoking and currently smoke or have quit within the past 15 years. Fecal occult blood test (FOBT) of the stool. You may have this test every year starting at age 60. Flexible sigmoidoscopy or colonoscopy. You may have a sigmoidoscopy every 5 years or a colonoscopy every 10 years starting at age 44. Hepatitis C blood test. Hepatitis B blood test. Sexually transmitted disease (STD) testing. Diabetes screening. This is done by checking your blood sugar (glucose) after you have not eaten for a while (fasting). You may have this done every 1-3 years. Bone density scan. This is done to screen for osteoporosis. You may have this done starting at age 36. Mammogram. This may be done every 1-2 years. Talk to your health care provider about how often you should have regular mammograms. Talk with your health care provider about your test results, treatment options, and if necessary, the need for more tests. Vaccines  Your health care provider may recommend certain vaccines, such as: Influenza vaccine. This is recommended every  year. Tetanus, diphtheria, and acellular pertussis (Tdap, Td) vaccine. You may need a Td booster every 10 years. Zoster vaccine. You may need this after age 9. Pneumococcal 13-valent conjugate (PCV13) vaccine. One dose is recommended after age 46. Pneumococcal polysaccharide (PPSV23) vaccine.  One dose is recommended after age 58. Talk to your health care provider about which screenings and vaccines you need and how often you need them. This information is not intended to replace advice given to you by your health care provider. Make sure you discuss any questions you have with your health care provider. Document Released: 03/12/2015 Document Revised: 11/03/2015 Document Reviewed: 12/15/2014 Elsevier Interactive Patient Education  2017 [place] Prevention in the Home Falls can cause injuries. They can happen to people of all ages. There are many things you can do to make your home safe and to help prevent falls. What can I do on the outside of my home? Regularly fix the edges of walkways and driveways and fix any cracks. Remove anything that might make you trip as you walk through a door, such as a raised step or threshold. Trim any bushes or trees on the path to your home. Use bright outdoor lighting. Clear any walking paths of anything that might make someone trip, such as rocks or tools. Regularly check to see if handrails are loose or broken. Make sure that both sides of any steps have handrails. Any raised decks and porches should have guardrails on the edges. Have any leaves, snow, or ice cleared regularly. Use sand or salt on walking paths during winter. Clean up any spills in your garage right away. This includes oil or grease spills. What can I do in the bathroom? Use night lights. Install grab bars by the toilet and in the tub and shower. Do not use towel bars as grab bars. Use non-skid mats or decals in the tub or shower. If you need to sit down in the shower, use a plastic, non-slip stool. Keep the floor dry. Clean up any water that spills on the floor as soon as it happens. Remove soap buildup in the tub or shower regularly. Attach bath mats securely with double-sided non-slip rug tape. Do not have throw rugs and other things on the floor that can make  you trip. What can I do in the bedroom? Use night lights. Make sure that you have a light by your bed that is easy to reach. Do not use any sheets or blankets that are too big for your bed. They should not hang down onto the floor. Have a firm chair that has side arms. You can use this for support while you get dressed. Do not have throw rugs and other things on the floor that can make you trip. What can I do in the kitchen? Clean up any spills right away. Avoid walking on wet floors. Keep items that you use a lot in easy-to-reach places. If you need to reach something above you, use a strong step stool that has a grab bar. Keep electrical cords out of the way. Do not use floor polish or wax that makes floors slippery. If you must use wax, use non-skid floor wax. Do not have throw rugs and other things on the floor that can make you trip. What can I do with my stairs? Do not leave any items on the stairs. Make sure that there are handrails on both sides of the stairs and use them. Fix handrails that are broken or  loose. Make sure that handrails are as long as the stairways. Check any carpeting to make sure that it is firmly attached to the stairs. Fix any carpet that is loose or worn. Avoid having throw rugs at the top or bottom of the stairs. If you do have throw rugs, attach them to the floor with carpet tape. Make sure that you have a light switch at the top of the stairs and the bottom of the stairs. If you do not have them, ask someone to add them for you. What else can I do to help prevent falls? Wear shoes that: Do not have high heels. Have rubber bottoms. Are comfortable and fit you well. Are closed at the toe. Do not wear sandals. If you use a stepladder: Make sure that it is fully opened. Do not climb a closed stepladder. Make sure that both sides of the stepladder are locked into place. Ask someone to hold it for you, if possible. Clearly mark and make sure that you can  see: Any grab bars or handrails. First and last steps. Where the edge of each step is. Use tools that help you move around (mobility aids) if they are needed. These include: Canes. Walkers. Scooters. Crutches. Turn on the lights when you go into a dark area. Replace any light bulbs as soon as they burn out. Set up your furniture so you have a clear path. Avoid moving your furniture around. If any of your floors are uneven, fix them. If there are any pets around you, be aware of where they are. Review your medicines with your doctor. Some medicines can make you feel dizzy. This can increase your chance of falling. Ask your doctor what other things that you can do to help prevent falls. This information is not intended to replace advice given to you by your health care provider. Make sure you discuss any questions you have with your health care provider. Document Released: 12/10/2008 Document Revised: 07/22/2015 Document Reviewed: 03/20/2014 Elsevier Interactive Patient Education  2017 [employer].

## 2022-01-24 NOTE — Progress Notes (Signed)
Virtual Visit via Telephone Note  I connected with  [name] on 01/24/22 at 10:00 AM EST by telephone and verified that I am speaking with the correct person using two identifiers.  Location: Patient: Home Provider: [place] Persons participating in the virtual visit: [place]   I discussed the limitations, risks, security and privacy concerns of performing an evaluation and management service by telephone and the availability of in person appointments. The patient expressed understanding and agreed to proceed.  Interactive audio and video telecommunications were attempted between this nurse and patient, however failed, due to patient having technical difficulties OR patient did not have access to video capability.  We continued and completed visit with audio only.  Some vital signs may be absent or patient reported.   Sheral Flow, LPN  Subjective:   ABRISH ERNY is a 88 y.o. female who presents for Medicare Annual (Subsequent) preventive examination.  Review of Systems     Cardiac Risk Factors include: advanced age (>36mn, >>78women);dyslipidemia;family history of premature cardiovascular disease;hypertension;obesity (BMI >30kg/m2)     Objective:    Today's Vitals   01/24/22 1006  Weight: 167 lb (75.8 kg)  Height: '4\' 11"'$  (1.499 m)  PainSc: 4   PainLoc: Hand   Body mass index is 33.73 kg/m.     01/24/2022   10:12 AM 12/08/2021    3:02 PM 06/05/2021   11:39 PM 01/17/2021   12:46 PM 11/14/2019   10:35 AM 03/05/2018   11:41 AM 05/16/2017    1:13 AM  Advanced Directives  Does Patient Have a Medical Advance Directive? Yes Yes Yes Yes Yes Yes No  Type of AParamed[place]of [place] will  [place] will Living will;Healthcare Power of [place]spect [place] will   Does patient want to make changes to medical advance directive?  Yes (MAU/Ambulatory/Procedural  Areas - Information given)  No - Patient declined No - Patient declined    Copy of H[place]in Chart? No - copy requested   No - copy requested  No - copy requested   Would patient like information on creating a medical advance directive?       Yes (ED - Information included in AVS)    Current Medications (verified) Outpatient Encounter Medications as of 01/24/2022  Medication Sig   allopurinol (ZYLOPRIM) 100 MG tablet TAKE 1 TABLET BY MOUTH EVERY DAY   aspirin 81 MG tablet Take 81 mg by mouth daily.   atenolol (TENORMIN) 50 MG tablet TAKE 2 TABLETS DAILY   dorzolamide-timolol (COSOPT) 22.3-6.8 MG/ML ophthalmic solution Place 1 drop into both eyes 2 (two) times daily.   ferrous sulfate 325 (65 FE) MG tablet Take 1 tablet by mouth daily with breakfast.   latanoprost (XALATAN) 0.005 % ophthalmic solution Place 1 drop into both eyes at bedtime.   levothyroxine (SYNTHROID) 50 MCG tablet TAKE 1 TABLET BY MOUTH EVERY DAY BEFORE BREAKFAST   lisinopril (ZESTRIL) 10 MG tablet Take 10 mg by mouth as directed. Takes '10mg'$  in the am and '5mg'$  at bedtime   lisinopril (ZESTRIL) 5 MG tablet Take 5 mg by mouth at bedtime.   nystatin-triamcinolone ointment (MYCOLOG) Apply 1 Application topically 2 (two) times daily.   simvastatin (ZOCOR) 20 MG tablet TAKE 1 TABLET DAILY AT 6PM   triamterene-hydrochlorothiazide (DYAZIDE) 37.5-25 MG capsule TAKE 1 CAPSULE BY MOUTH EVERY DAY   Wheat Dextrin (BENEFIBER DRINK MIX PO) Take by mouth daily.  No facility-administered encounter medications on file as of 01/24/2022.    Allergies (verified) Amlodipine besylate, Clonidine, Diltiazem hcl, Klonopin [clonazepam], Tetracycline, Doxycycline hyclate, Hydralazine, Iodine, Labetalol hcl, Lidocaine, and Olmesartan medoxomil   History: Past Medical History:  Diagnosis Date   Anxiety    Benign neoplasm of kidney, except pelvis    Cellulitis of leg 03/09/2013 hosp    Cerebrovascular disease    Chronic low  back pain    Diabetes mellitus without complication ([place]) 9/56/3875   Diastolic CHF (HCC)    Dr Daneen Schick   Difficult intubation    per patient with hand surgery in 2016 at surgical center- lip was pinched and swollen after surgery- patient states dr Burney Gauze never stated a problem with intubation    Diverticulosis    DJD (degenerative joint disease)    Dyspnea    with exertion    GERD (gastroesophageal reflux disease)    no meds   Glaucoma    Gout    H/O hiatal hernia    Hepatitis    hx of years ago    Hx of colonic polyps    Hypercholesteremia    Hypertension    Hypothyroid    IBS (irritable bowel syndrome)    Internal hemorrhoids    Obesity    OSA (obstructive sleep apnea)    NPSG 2009: AHI 20/h. CPAP - follows with Clance   Renal insufficiency    one kidney small and non funtioning   Tubular adenoma 2000   Venous insufficiency    Past Surgical History:  Procedure Laterality Date   ABDOMINAL HYSTERECTOMY     APPENDECTOMY     CATARACT EXTRACTION, BILATERAL Bilateral 2017   cataracts Bilateral    COLONOSCOPY     CYSTOSCOPY/RETROGRADE/URETEROSCOPY Bilateral 04/09/2017   Procedure: CYSTOSCOPY/RETROGRADE;  Surgeon: Raynelle Bring, MD;  Location: [facility];  Service: Urology;  Laterality: Bilateral;  ONLY NEEDS 30 MIN FOR PROCEDURE   DILATION AND CURETTAGE OF UTERUS     FUNCTIONAL ENDOSCOPIC SINUS SURGERY  05/2003   Dr. Ernesto Rutherford   hysterctomy and ooperectomy  1970's   MASS EXCISION  2003   lt   MASS EXCISION Left 09/17/2013   Procedure: EXCISION MASS LEFT HAND;  Surgeon: Schuyler Amor, MD;  Location: [facility];  Service: Orthopedics;  Laterality: Left;   thyroid lobectomy for a cyst  10/2000   Dr. Ernesto Rutherford   TONSILLECTOMY     Family History  Problem Relation Age of Onset   Heart disease Mother    Diabetes Mother    Congestive Heart Failure Mother    Asthma Brother    Throat cancer Brother 15   Asthma Sister    Brain cancer Sister 91   Breast  cancer Sister 48   Colon cancer Neg Hx    Esophageal cancer Neg Hx    Stomach cancer Neg Hx    Pancreatic cancer Neg Hx    Social History   Socioeconomic History   Marital status: Single    Spouse name: Not on file   Number of children: 1   Years of education: Not on file   Highest education level: Not on file  Occupational History   Occupation: retired from worked in Engineer, manufacturing systems: RETIRED  Tobacco Use   Smoking status: Never   Smokeless tobacco: Never  Vaping Use   Vaping Use: Never used  Substance and Sexual Activity   Alcohol use: No    Alcohol/week: 0.0 standard drinks of  alcohol   Drug use: No   Sexual activity: Not Currently  Other Topics Concern   Not on file  Social History Narrative   Lives alone, indep - never married   g-dtr in [place], siblings, nieces in [place] in touch with a few friends who talk regularly   Social Determinants of Health   Financial Resource Strain: [place]  (01/24/2022)   Overall Financial Resource Strain (CARDIA)    Difficulty of Paying Living Expenses: Not hard at all  Food Insecurity: No Food Insecurity (01/24/2022)   Hunger Vital Sign    Worried About Running Out of Food in the Last Year: Never true    [place] in the Last Year: Never true  Transportation Needs: No Transportation Needs (01/24/2022)   PRAPARE - Hydrologist (Medical): No    Lack of Transportation (Non-Medical): No  Physical Activity: Sufficiently Active (01/24/2022)   Exercise Vital Sign    Days of Exercise per Week: 5 days    Minutes of Exercise per Session: 30 min  Stress: No Stress Concern Present (01/24/2022)   [place]    Feeling of Stress : Not at all  Social Connections: Moderately Isolated (01/24/2022)   Social Connection and Isolation Panel [NHANES]    Frequency of Communication with Friends and Family: More than three times a week     Frequency of Social Gatherings with Friends and Family: More than three times a week    Attends Religious Services: More than 4 times per year    Active Member of [employer] or Organizations: No    Attends Archivist Meetings: Never    Marital Status: Widowed    Tobacco Counseling Counseling given: Not Answered   Clinical Intake:  Pre-visit preparation completed: Yes  Pain : No/denies pain Pain Score: 4      BMI - recorded: 33.73 Nutritional Status: BMI > 30  Obese Nutritional Risks: None Diabetes: No  How often do you need to have someone help you when you read instructions, pamphlets, or other written materials from your doctor or pharmacy?: 1 - Never What is the last grade level you completed in school?: HSG  Diabetic? No  Interpreter Needed?: No  Information entered by :: Lisette Abu, LPN.   Activities of Daily Living    01/24/2022   10:15 AM  In your present state of health, do you have any difficulty performing the following activities:  Hearing? 0  Vision? 0  Difficulty concentrating or making decisions? 0  Walking or climbing stairs? 1  Dressing or bathing? 0  Doing errands, shopping? 0  Preparing Food and eating ? N  Using the Toilet? N  In the past six months, have you accidently leaked urine? N  Do you have problems with loss of bowel control? N  Managing your Medications? N  Managing your Finances? N  Housekeeping or managing your Housekeeping? N    Patient Care Team: Hoyt Koch, MD as PCP - General (Internal Medicine) Clance, Armando Reichert, MD (Sleep Medicine) Josue Hector, MD (Cardiology) Gatha Mayer, MD (Gastroenterology) Edrick Oh, MD (Nephrology) Raynelle Bring, MD (Urology) Thornell Sartorius, MD (Otolaryngology) Rutherford Guys, MD (Ophthalmology) Avon Gully, NP (Obstetrics and Gynecology) Edilia Bo, Tracie Harrier, MD (Ophthalmology) Charlotte Crumb, MD (Orthopedic Surgery) Binnie Rail, DC as  Referring Physician (Chiropractic Medicine)  Indicate any recent Medical Services you may have received from other than  [facility] providers in the past year (date may be approximate).     Assessment:   This is a routine wellness examination for [facility].  Hearing/Vision screen Hearing Screening - Comments:: Denies hearing difficulties   Vision Screening - Comments:: Wears rx glasses - up to date with routine eye exams with Rutherford Guys, MD.   Dietary issues and exercise activities discussed: Current Exercise Habits: Home exercise routine, Type of exercise: walking, Time (Minutes): 30, Frequency (Times/Week): 5, Weekly Exercise (Minutes/Week): 150, Intensity: Moderate, Exercise limited by: orthopedic condition(s)   Goals Addressed             This Visit's Progress    Client understands the importance of follow-up with providers by attending scheduled visits        Depression Screen    01/24/2022   10:13 AM 11/09/2021    1:37 PM 09/22/2021    9:58 AM 08/12/2021    9:39 AM 06/27/2021    9:01 AM 01/17/2021   12:01 PM 11/14/2019   10:37 AM  PHQ 2/9 Scores  PHQ - 2 Score 0 1 1 0 0 0 0  PHQ- 9 Score  1 5 0 0      Fall Risk    01/24/2022   10:15 AM 11/09/2021    1:37 PM 09/22/2021    9:59 AM 08/12/2021    9:39 AM 06/27/2021    9:01 AM  Fall Risk   Falls in the past year? 0 0 0 0 0  Number falls in past yr: 0 0 0 0 0  Injury with Fall? 0 0 0 0 0  Risk for fall due to : No Fall Risks No Fall Risks     Follow up Falls prevention discussed Falls evaluation completed       FALL RISK PREVENTION PERTAINING TO THE HOME:  Any stairs in or around the home? No  If so, are there any without handrails? No  Home free of loose throw rugs in walkways, pet beds, electrical cords, etc? Yes  Adequate lighting in your home to reduce risk of falls? Yes   ASSISTIVE DEVICES UTILIZED TO PREVENT FALLS:  Life alert? No  Use of a cane, walker or w/c? No  Grab bars in the bathroom? Yes  Shower chair or  bench in shower? No  Elevated toilet seat or a handicapped toilet? No   TIMED UP AND GO:  Was the test performed? No . Phone Visit  Cognitive Function:    02/15/2017    1:47 PM  MMSE - Mini Mental State Exam  Orientation to time 5  Orientation to Place 5  Registration 3  Attention/ Calculation 5  Recall 2  Language- name 2 objects 2  Language- repeat 1  Language- follow 3 step command 3  Language- read & follow direction 1  Write a sentence 1  Copy design 1  Total score 29        01/24/2022   10:16 AM  6CIT Screen  What Year? 0 points  What month? 0 points  What time? 0 points  Count back from 20 0 points  Months in reverse 0 points  Repeat phrase 0 points  Total Score 0 points    Immunizations Immunization History  Administered Date(s) Administered   Fluad Quad(high Dose 65+) 11/09/2018, 12/02/2019, 11/10/2020, 11/14/2021   Influenza Split 11/30/2010, 11/20/2011   Influenza Whole 12/12/2006, 11/27/2007, 11/25/2008, 11/26/2009   Influenza, High Dose Seasonal PF 11/23/2015, 11/20/2017   Influenza,inj,Quad PF,6+ Mos 11/26/2012, 11/06/2013, 11/19/2014  PFIZER(Purple Top)SARS-COV-2 Vaccination 03/24/2019, 04/14/2019, 01/27/2020   Pneumococcal Conjugate-13 12/15/2014   Pneumococcal Polysaccharide-23 03/16/2009   Td 02/27/2001   Tdap 03/21/2012    TDAP status: Up to date  Flu Vaccine status: Up to date  Pneumococcal vaccine status: Up to date  Covid-19 vaccine status: Completed vaccines  Qualifies for Shingles Vaccine? Yes   Zostavax completed No   Shingrix Completed?: No.    Education has been provided regarding the importance of this vaccine. Patient has been advised to call insurance company to determine out of pocket expense if they have not yet received this vaccine. Advised may also receive vaccine at local pharmacy or Health Dept. Verbalized acceptance and understanding.  Screening Tests Health Maintenance  Topic Date Due   COVID-19 Vaccine (4 -  2023-24 season) 10/28/2021   Medicare Annual Wellness (AWV)  01/25/2023   Pneumonia Vaccine 28+ Years old  Completed   INFLUENZA VACCINE  Completed   DEXA SCAN  Completed   HPV VACCINES  Aged Out   Zoster Vaccines- Shingrix  Discontinued    Health Maintenance  Health Maintenance Due  Topic Date Due   COVID-19 Vaccine (4 - 2023-24 season) 10/28/2021    Colorectal cancer screening: No longer required.   Mammogram status: Completed 08/2021. Repeat every year  Bone Density status: Completed 07/25/2017. Results reflect: Bone density results: NORMAL. Repeat every 5 years.  Lung Cancer Screening: (Low Dose CT Chest recommended if Age 5-80 years, 30 pack-year currently smoking OR have quit w/in 15years.) does not qualify.   Lung Cancer Screening Referral: no  Additional Screening:  Hepatitis C Screening: does not qualify; Completed no  Vision Screening: Recommended annual ophthalmology exams for early detection of glaucoma and other disorders of the eye. Is the patient up to date with their annual eye exam?  Yes  Who is the provider or what is the name of the office in which the patient attends annual eye exams? Rutherford Guys, MD. If pt is not established with a provider, would they like to be referred to a provider to establish care? No .   Dental Screening: Recommended annual dental exams for proper oral hygiene  Community Resource Referral / Chronic Care Management: CRR required this visit?  No   CCM required this visit?  No      Plan:     I have personally reviewed and noted the following in the patient's chart:   Medical and social history Use of alcohol, tobacco or illicit drugs  Current medications and supplements including opioid prescriptions. Patient is not currently taking opioid prescriptions. Functional ability and status Nutritional status Physical activity Advanced directives List of other physicians Hospitalizations, surgeries, and ER visits in previous  12 months Vitals Screenings to include cognitive, depression, and falls Referrals and appointments  In addition, I have reviewed and discussed with patient certain preventive protocols, quality metrics, and best practice recommendations. A written personalized care plan for preventive services as well as general preventive health recommendations were provided to patient.     Sheral Flow, LPN   29/79/8921   Nurse Notes: N/A

## 2022-02-15 ENCOUNTER — Other Ambulatory Visit: Payer: Self-pay | Admitting: Internal Medicine

## 2022-03-13 ENCOUNTER — Other Ambulatory Visit: Payer: Self-pay | Admitting: Internal Medicine

## 2022-03-20 ENCOUNTER — Ambulatory Visit (INDEPENDENT_AMBULATORY_CARE_PROVIDER_SITE_OTHER): Payer: Medicare Other | Admitting: Podiatry

## 2022-03-20 ENCOUNTER — Ambulatory Visit (INDEPENDENT_AMBULATORY_CARE_PROVIDER_SITE_OTHER): Payer: Medicare Other

## 2022-03-20 ENCOUNTER — Other Ambulatory Visit: Payer: Self-pay | Admitting: Internal Medicine

## 2022-03-20 ENCOUNTER — Encounter: Payer: Self-pay | Admitting: Podiatry

## 2022-03-20 DIAGNOSIS — R609 Edema, unspecified: Secondary | ICD-10-CM | POA: Diagnosis not present

## 2022-03-20 DIAGNOSIS — M7752 Other enthesopathy of left foot: Secondary | ICD-10-CM | POA: Diagnosis not present

## 2022-03-20 DIAGNOSIS — M7751 Other enthesopathy of right foot: Secondary | ICD-10-CM | POA: Diagnosis not present

## 2022-03-20 DIAGNOSIS — M76822 Posterior tibial tendinitis, left leg: Secondary | ICD-10-CM

## 2022-03-20 MED ORDER — TRIAMCINOLONE ACETONIDE 10 MG/ML IJ SUSP
10.0000 mg | Freq: Once | INTRAMUSCULAR | Status: AC
  Administered 2022-03-20: 10 mg

## 2022-03-20 NOTE — Progress Notes (Signed)
Subjective:   Patient ID: April Donaldson, female   DOB: 89 y.o.   MRN: [id number]   HPI Patient states she has been getting a lot of swelling right foot over left foot and also that she does take allopurinol and states this is the worse her big toe joint has been with both feet swelling.  Patient is moderately obese does not smoke and tries to be active   Review of Systems  All other systems reviewed and are negative.       Objective:  Physical Exam Vitals and nursing note reviewed.  Constitutional:      Appearance: She is well-developed.  Pulmonary:     Effort: Pulmonary effort is normal.  Musculoskeletal:        General: Normal range of motion.  Skin:    General: Skin is warm.  Neurological:     Mental Status: She is alert.     Neurovascular status found to be intact muscle strength was found to be adequate range of motion adequate with patient found to have inflammation and pain around the first MPJ right with fluid buildup around the joint and has swelling in the foot and ankle bilateral with negative Bevelyn Buckles' sign noted bilateral.  Patient is found to have good digital perfusion well-oriented x 3     Assessment:  Appears to be combination of several problems with the possibility for acute gout right first MPJ along with chronic swelling possible venous disease bilateral     Plan:  H&P reviewed all conditions.  Did sterile prep and injected around the first MPJ right 3 mg dexamethasone Kenalog 5 mg Xylocaine and went ahead and applied ankle compression stocking bilateral and patient will be seen back to recheck again.  May require different types of treatment depending on response  X-rays look healthy no indications of osteolysis or any indications of chronic gout with mild bunion deformity

## 2022-03-21 ENCOUNTER — Other Ambulatory Visit: Payer: Self-pay | Admitting: Podiatry

## 2022-03-21 DIAGNOSIS — M76822 Posterior tibial tendinitis, left leg: Secondary | ICD-10-CM

## 2022-03-21 DIAGNOSIS — R609 Edema, unspecified: Secondary | ICD-10-CM

## 2022-03-21 DIAGNOSIS — M7751 Other enthesopathy of right foot: Secondary | ICD-10-CM

## 2022-03-27 ENCOUNTER — Ambulatory Visit (INDEPENDENT_AMBULATORY_CARE_PROVIDER_SITE_OTHER): Payer: Medicare Other | Admitting: Internal Medicine

## 2022-03-27 ENCOUNTER — Encounter: Payer: Self-pay | Admitting: Internal Medicine

## 2022-03-27 VITALS — BP 140/80 | HR 59 | Temp 97.5°F | Ht 59.0 in | Wt 167.0 lb

## 2022-03-27 DIAGNOSIS — D649 Anemia, unspecified: Secondary | ICD-10-CM

## 2022-03-27 DIAGNOSIS — E039 Hypothyroidism, unspecified: Secondary | ICD-10-CM

## 2022-03-27 DIAGNOSIS — M1A00X0 Idiopathic chronic gout, unspecified site, without tophus (tophi): Secondary | ICD-10-CM

## 2022-03-27 DIAGNOSIS — N1832 Chronic kidney disease, stage 3b: Secondary | ICD-10-CM

## 2022-03-27 DIAGNOSIS — E782 Mixed hyperlipidemia: Secondary | ICD-10-CM

## 2022-03-27 LAB — COMPREHENSIVE METABOLIC PANEL
ALT: 10 U/L (ref 0–35)
AST: 14 U/L (ref 0–37)
Albumin: 4 g/dL (ref 3.5–5.2)
Alkaline Phosphatase: 66 U/L (ref 39–117)
BUN: 24 mg/dL — ABNORMAL HIGH (ref 6–23)
CO2: 28 mEq/L (ref 19–32)
Calcium: 9.3 mg/dL (ref 8.4–10.5)
Chloride: 104 mEq/L (ref 96–112)
Creatinine, Ser: 1.05 mg/dL (ref 0.40–1.20)
GFR: 47.55 mL/min — ABNORMAL LOW (ref >60.00)
Glucose, Bld: 85 mg/dL (ref 70–99)
Potassium: 3.8 mEq/L (ref 3.5–5.1)
Sodium: 140 mEq/L (ref 135–145)
Total Bilirubin: 0.4 mg/dL (ref 0.2–1.2)
Total Protein: 8 g/dL (ref 6.0–8.3)

## 2022-03-27 LAB — LIPID PANEL
Cholesterol: 165 mg/dL (ref 0–200)
HDL: 52.4 mg/dL (ref >39.00)
LDL Cholesterol: 86 mg/dL (ref 0–99)
NonHDL: 112.55
Total CHOL/HDL Ratio: 3
Triglycerides: 132 mg/dL (ref 0.0–149.0)
VLDL: 26.4 mg/dL (ref 0.0–40.0)

## 2022-03-27 LAB — CBC
HCT: 36.6 % (ref 36.0–46.0)
Hemoglobin: 12.1 g/dL (ref 12.0–15.0)
MCHC: 33 g/dL (ref 30.0–36.0)
MCV: 85.8 fl (ref 78.0–100.0)
Platelets: 367 10*3/uL (ref 150.0–400.0)
RBC: 4.26 Mil/uL (ref 3.87–5.11)
RDW: 16 % — ABNORMAL HIGH (ref 11.5–15.5)
WBC: 9.3 10*3/uL (ref 4.0–10.5)

## 2022-03-27 LAB — URIC ACID: Uric Acid, Serum: 6.1 mg/dL (ref 2.4–7.0)

## 2022-03-27 LAB — TSH: TSH: 3.83 u[IU]/mL (ref 0.35–5.50)

## 2022-03-27 LAB — FERRITIN: Ferritin: 83.6 ng/mL (ref 10.0–291.0)

## 2022-03-27 NOTE — Progress Notes (Signed)
   Subjective:   Patient ID: [name], female    DOB: 12-Feb-1932, 89 y.o.   MRN: [id number]  HPI The patient is an 89 YO female coming in for follow up  Review of Systems  Constitutional: Negative.   HENT: Negative.    Eyes: Negative.   Respiratory:  Negative for cough, chest tightness and shortness of breath.   Cardiovascular:  Negative for chest pain, palpitations and leg swelling.  Gastrointestinal:  Negative for abdominal distention, abdominal pain, constipation, diarrhea, nausea and vomiting.  Musculoskeletal: Negative.   Skin: Negative.   Neurological: Negative.   Psychiatric/Behavioral: Negative.      Objective:  Physical Exam Constitutional:      Appearance: She is well-developed.  HENT:     Head: Normocephalic and atraumatic.  Cardiovascular:     Rate and Rhythm: Normal rate and regular rhythm.  Pulmonary:     Effort: Pulmonary effort is normal. No respiratory distress.     Breath sounds: Normal breath sounds. No wheezing or rales.  Abdominal:     General: Bowel sounds are normal. There is no distension.     Palpations: Abdomen is soft.     Tenderness: There is no abdominal tenderness. There is no rebound.  Musculoskeletal:     Cervical back: Normal range of motion.  Skin:    General: Skin is warm and dry.  Neurological:     Mental Status: She is alert and oriented to person, place, and time.     Coordination: Coordination normal.     Vitals:   03/27/22 1102 03/27/22 1109  BP: (!) 140/80 (!) 140/80  Pulse: (!) 59   Temp: (!) 97.5 F (36.4 C)   TempSrc: Oral   SpO2: 99%   Weight: 167 lb (75.8 kg)   Height: '4\' 11"'$  (1.499 m)     Assessment & Plan:

## 2022-03-27 NOTE — Patient Instructions (Signed)
We will check the labs today.    

## 2022-03-28 NOTE — Assessment & Plan Note (Signed)
Checking CMP for stability. Adjust as needed. BP at goal.

## 2022-03-28 NOTE — Assessment & Plan Note (Signed)
Checking CBC and ferritin. Adjust as needed. No dark stools.

## 2022-03-28 NOTE — Assessment & Plan Note (Signed)
Checking uric acid and adjust as needed allopurinol 100 mg daily she has had recent flare may need adjustment.

## 2022-03-28 NOTE — Assessment & Plan Note (Signed)
Checking lipid panel and adjust simvastatin 20 mg daily as needed.  

## 2022-03-28 NOTE — Assessment & Plan Note (Signed)
Checking TSH and adjust as needed her levothyroxine 50 mcg daily.

## 2022-03-31 LAB — LAB REPORT - SCANNED
Albumin, Urine POC: 185.1
Albumin/Creatinine Ratio, Urine, POC: 319
Creatinine, POC: 58 mg/dL
Protein/Creatinine Ratio: 548

## 2022-06-12 ENCOUNTER — Encounter (HOSPITAL_BASED_OUTPATIENT_CLINIC_OR_DEPARTMENT_OTHER): Payer: Self-pay | Admitting: Pulmonary Disease

## 2022-06-12 ENCOUNTER — Ambulatory Visit (INDEPENDENT_AMBULATORY_CARE_PROVIDER_SITE_OTHER): Payer: Medicare Other | Admitting: Pulmonary Disease

## 2022-06-12 VITALS — BP 148/86 | HR 52 | Temp 97.9°F | Ht 59.0 in | Wt 171.0 lb

## 2022-06-12 DIAGNOSIS — Z789 Other specified health status: Secondary | ICD-10-CM | POA: Diagnosis not present

## 2022-06-12 DIAGNOSIS — G4733 Obstructive sleep apnea (adult) (pediatric): Secondary | ICD-10-CM

## 2022-06-12 NOTE — Patient Instructions (Signed)
Will arrange for CPAP mask refitting at the sleep lab  Follow up in 4 months

## 2022-06-12 NOTE — Progress Notes (Signed)
[facility]  Chief Complaint  Patient presents with   Follow-up    Follow up. Patient says she is still having some issues.     Constitutional:  BP (!) 148/86 (BP Location: Left Arm, Patient Position: Sitting, Cuff Size: Normal)   Pulse (!) 52   Temp 97.9 F (36.6 C) (Oral)   Ht 4\' 11"  (1.499 m)   Wt 171 lb (77.6 kg)   SpO2 98%   BMI 34.54 kg/m   Past Medical History:  IBS, Hypothyroidism, HTN, HLD, Colon polyps, HH, Gout, Glaucoma, GERD, CVA, Anxiety, Cellulitis Lt leg 2015, Chronic back pain, Difficult intubation  Past Surgical History:  She  has a past surgical history that includes hysterctomy and ooperectomy (1970's); thyroid lobectomy for a cyst (10/2000); Functional endoscopic sinus surgery (05/2003); Colonoscopy; Tonsillectomy; Appendectomy; Dilation and curettage of uterus; Mass excision (2003); Mass excision (Left, 09/17/2013); cataracts (Bilateral); Abdominal hysterectomy; Cystoscopy/retrograde/ureteroscopy (Bilateral, 04/09/2017); and Cataract extraction, bilateral (Bilateral, 2017).  Brief Summary:  April Donaldson is a 89 y.o. female with obstructive sleep apnea.      Subjective:   She still has trouble putting her CPAP mask on.  She has been using nasal pillows.  She doesn't like the mask material.  She gets episodes when she wakes up from a dream.  During the dream she feels like she is drowning, and then when she wakes up her mouth is full of saliva and she is drooling.  She has noticed her blood pressure has been running higher than normal.  Physical Exam:   Appearance - well kempt   ENMT - no sinus tenderness, no oral exudate, no LAN, Mallampati 3 airway, no stridor  Respiratory - equal breath sounds bilaterally, no wheezing or rales  CV - s1s2 regular rate and rhythm, no murmurs  Ext - no clubbing, no edema  Skin - no rashes  Psych - normal mood and affect   Sleep Tests:  PSG 10/14/07 >> AHI 20 Auto CPAP  03/14/20 to 06/11/20 >> used on 71 of 90 nights with average 5 hrs 25 min.  Average AHI 0.3 with median CPAP 9 and 95 th percentile CPAP 12 cm H2O  Cardiac Tests:  Echo 02/12/20 >> EF 55 to 60%, grade 2 DD  Social History:  She  reports that she has never smoked. She has never used smokeless tobacco. She reports that she does not drink alcohol and does not use drugs.  Family History:  Her family history includes Asthma in her brother and sister; Brain cancer (age of onset: 46) in her sister; Breast cancer (age of onset: 20) in her sister; Congestive Heart Failure in her mother; Diabetes in her mother; Heart disease in her mother; Throat cancer (age of onset: 86) in her brother.     Assessment/Plan:   Obstructive sleep apnea. - she continues to have trouble with CPAP use related to mask fit and pressure settings - her device is more than 87 yrs old - she gets supplies from [employer] - will arrange for mask refitting at the sleep lab - if she continues to have trouble will then arrange for a CPAP titration study and then get a new CPAP machine - ha lengthy discussion about how sleep apnea can impact her health and difference between primary prevention and secondary prevention  Chronic HFpEF. - previously followed by Dr. Verdis Prime with cardiology - discussed how sleep apnea can impact her diastolic function in relation to hypertension  Time  Spent Involved in Patient Care on Day of Examination:  26 minutes  Follow up:   Patient Instructions  Will arrange for CPAP mask refitting at the sleep lab  Follow up in 4 months  Medication List:   Allergies as of 06/12/2022       Reactions   Amlodipine Besylate Swelling, Other (See Comments)   Causes swelling   Clonidine Nausea Only, Other (See Comments)   Sweating, felt faint   Diltiazem Hcl Swelling, Other (See Comments)   meds did not work for her----causes swelling   Klonopin [clonazepam] Other (See Comments)   Vivid dreams    Tetracycline Swelling   Doxycycline Hyclate Other (See Comments)   REACTION:  tetracycline   Hydralazine Rash   Iodine Other (See Comments)   IVP contrast Pt. States topical iodine is fine to use without any irritation. H.Mickley RN   Labetalol Hcl Other (See Comments)   Unknown   Lidocaine Rash   Olmesartan Medoxomil Other (See Comments)   Unknown        Medication List        Accurate as of June 12, 2022 11:50 AM. If you have any questions, ask your nurse or doctor.          allopurinol 100 MG tablet Commonly known as: ZYLOPRIM TAKE 1 TABLET BY MOUTH EVERY DAY   aspirin 81 MG tablet Take 81 mg by mouth daily.   atenolol 50 MG tablet Commonly known as: TENORMIN TAKE 2 TABLETS DAILY   BENEFIBER DRINK MIX PO Take by mouth daily.   dorzolamide-timolol 2-0.5 % ophthalmic solution Commonly known as: COSOPT Place 1 drop into both eyes 2 (two) times daily.   ferrous sulfate 325 (65 FE) MG tablet Take 1 tablet by mouth daily with breakfast.   latanoprost 0.005 % ophthalmic solution Commonly known as: XALATAN Place 1 drop into both eyes at bedtime.   levothyroxine 50 MCG tablet Commonly known as: SYNTHROID TAKE 1 TABLET BY MOUTH EVERY DAY BEFORE BREAKFAST   lisinopril 10 MG tablet Commonly known as: ZESTRIL Take 10 mg by mouth as directed. Takes  in the am and  at bedtime   lisinopril 5 MG tablet Commonly known as: ZESTRIL Take 5 mg by mouth at bedtime.   nystatin-triamcinolone ointment Commonly known as: MYCOLOG Apply 1 Application topically 2 (two) times daily.   simvastatin 20 MG tablet Commonly known as: ZOCOR TAKE 1 TABLET DAILY AT 6PM        Signature:  Coralyn Helling, MD [facility] Pager - [phone] 06/12/2022, 11:50 AM

## 2022-07-25 ENCOUNTER — Ambulatory Visit (INDEPENDENT_AMBULATORY_CARE_PROVIDER_SITE_OTHER): Payer: Medicare Other | Admitting: Internal Medicine

## 2022-07-25 ENCOUNTER — Encounter: Payer: Self-pay | Admitting: Internal Medicine

## 2022-07-25 VITALS — BP 140/90 | HR 112 | Temp 98.1°F | Ht 59.0 in | Wt 171.0 lb

## 2022-07-25 DIAGNOSIS — E039 Hypothyroidism, unspecified: Secondary | ICD-10-CM

## 2022-07-25 DIAGNOSIS — Z0000 Encounter for general adult medical examination without abnormal findings: Secondary | ICD-10-CM | POA: Diagnosis not present

## 2022-07-25 DIAGNOSIS — D649 Anemia, unspecified: Secondary | ICD-10-CM | POA: Diagnosis not present

## 2022-07-25 DIAGNOSIS — I10 Essential (primary) hypertension: Secondary | ICD-10-CM

## 2022-07-25 DIAGNOSIS — I5189 Other ill-defined heart diseases: Secondary | ICD-10-CM

## 2022-07-25 DIAGNOSIS — N1832 Chronic kidney disease, stage 3b: Secondary | ICD-10-CM

## 2022-07-25 DIAGNOSIS — E782 Mixed hyperlipidemia: Secondary | ICD-10-CM

## 2022-07-25 LAB — CBC
HCT: 38.7 % (ref 36.0–46.0)
Hemoglobin: 12.4 g/dL (ref 12.0–15.0)
MCHC: 32 g/dL (ref 30.0–36.0)
MCV: 89.1 fl (ref 78.0–100.0)
Platelets: 331 10*3/uL (ref 150.0–400.0)
RBC: 4.34 Mil/uL (ref 3.87–5.11)
RDW: 14.4 % (ref 11.5–15.5)
WBC: 9.3 10*3/uL (ref 4.0–10.5)

## 2022-07-25 LAB — COMPREHENSIVE METABOLIC PANEL
ALT: 12 U/L (ref 0–35)
AST: 18 U/L (ref 0–37)
Albumin: 3.9 g/dL (ref 3.5–5.2)
Alkaline Phosphatase: 61 U/L (ref 39–117)
BUN: 23 mg/dL (ref 6–23)
CO2: 28 mEq/L (ref 19–32)
Calcium: 9.4 mg/dL (ref 8.4–10.5)
Chloride: 102 mEq/L (ref 96–112)
Creatinine, Ser: 1.19 mg/dL (ref 0.40–1.20)
GFR: 40.82 mL/min — ABNORMAL LOW (ref >60.00)
Glucose, Bld: 82 mg/dL (ref 70–99)
Potassium: 4.2 mEq/L (ref 3.5–5.1)
Sodium: 138 mEq/L (ref 135–145)
Total Bilirubin: 0.4 mg/dL (ref 0.2–1.2)
Total Protein: 7.5 g/dL (ref 6.0–8.3)

## 2022-07-25 NOTE — Patient Instructions (Addendum)
You can get a tetanus shot at the pharmacy.  You can stop the iron supplements.

## 2022-07-25 NOTE — Progress Notes (Unsigned)
   Subjective:   Patient ID: April Donaldson, female    DOB: 1931/10/18, 89 y.o.   MRN: [id number]  HPI The patient is an 89 YO female coming in for physical. Recent in home visit from insurance company and had a few questions.   Review of Systems  Constitutional: Negative.   HENT: Negative.    Eyes: Negative.   Respiratory:  Negative for cough, chest tightness and shortness of breath.   Cardiovascular:  Negative for chest pain, palpitations and leg swelling.  Gastrointestinal:  Negative for abdominal distention, abdominal pain, constipation, diarrhea, nausea and vomiting.  Musculoskeletal:  Positive for arthralgias.  Skin: Negative.   Neurological: Negative.   Psychiatric/Behavioral: Negative.      Objective:  Physical Exam Constitutional:      Appearance: She is well-developed.  HENT:     Head: Normocephalic and atraumatic.  Cardiovascular:     Rate and Rhythm: Normal rate and regular rhythm.  Pulmonary:     Effort: Pulmonary effort is normal. No respiratory distress.     Breath sounds: Normal breath sounds. No wheezing or rales.  Abdominal:     General: Bowel sounds are normal. There is no distension.     Palpations: Abdomen is soft.     Tenderness: There is no abdominal tenderness. There is no rebound.  Musculoskeletal:        General: Tenderness present.     Cervical back: Normal range of motion.  Skin:    General: Skin is warm and dry.  Neurological:     Mental Status: She is alert and oriented to person, place, and time.     Coordination: Coordination normal.     Vitals:   07/25/22 1057 07/25/22 1059  BP: (!) 140/90 (!) 140/90  Pulse: (!) 112   Temp: 98.1 F (36.7 C)   TempSrc: Oral   SpO2: 97%   Weight: 171 lb (77.6 kg)   Height: 4\' 11"  (1.499 m)     Assessment & Plan:

## 2022-07-26 NOTE — Assessment & Plan Note (Signed)
BP at goal today and will continue lisinopril 15 mg daily (10 qam 5 qpm) and atenolol 100 mg daily. Counseled on low sodium. Checking CMP and adjust as needed.

## 2022-07-26 NOTE — Assessment & Plan Note (Signed)
Keep synthroid 50 mcg daily. Recent levels at goal not checking today.

## 2022-07-26 NOTE — Assessment & Plan Note (Signed)
Recent levels improving and iron levels back to normal. Recheck CBC today and advised she can stop iron pills for now.

## 2022-07-26 NOTE — Assessment & Plan Note (Signed)
Flu shot yearly. Pneumonia complete. Shingrix complete. Tetanus due at pharmacy. RSV due at pharmacy. Colonoscopy aged out. Mammogram aged out, pap smear aged out and dexa complete. Counseled about sun safety and mole surveillance. Counseled about the dangers of distracted driving. Given 10 year screening recommendations.

## 2022-07-26 NOTE — Assessment & Plan Note (Signed)
Recent lipid panel at goal keep simvastatin 20 mg daily.

## 2022-07-26 NOTE — Assessment & Plan Note (Signed)
Checking CMP and BP at goal today. Some lability with her BP and could be related to salt intake. She does try to monitor this and limit. Continue lisinopril 10 mg qam and 5 mg qpm and atenolol 100 mg daily. Adjust as needed.

## 2022-07-26 NOTE — Assessment & Plan Note (Signed)
No signs of heart failure. Is on ACE-I and statin. No chest pain and stable SOB.

## 2022-08-11 ENCOUNTER — Other Ambulatory Visit: Payer: Self-pay | Admitting: Internal Medicine

## 2022-08-17 ENCOUNTER — Other Ambulatory Visit: Payer: Self-pay | Admitting: Internal Medicine

## 2022-08-24 ENCOUNTER — Telehealth (HOSPITAL_BASED_OUTPATIENT_CLINIC_OR_DEPARTMENT_OTHER): Payer: Self-pay | Admitting: Pulmonary Disease

## 2022-08-24 NOTE — Telephone Encounter (Signed)
Lmam for sleep center to set up

## 2022-08-24 NOTE — Telephone Encounter (Signed)
Patients states she has not heard from [place] to schedule her CPAP Desensitization mask fit. Please advise and call patient back.

## 2022-08-27 ENCOUNTER — Encounter (HOSPITAL_COMMUNITY): Payer: Self-pay

## 2022-08-27 ENCOUNTER — Ambulatory Visit (HOSPITAL_COMMUNITY)
Admission: EM | Admit: 2022-08-27 | Discharge: 2022-08-27 | Disposition: A | Discharge status: Home / Self Care | Payer: Medicare Other | Attending: Emergency Medicine | Admitting: Emergency Medicine

## 2022-08-27 DIAGNOSIS — Z20822 Contact with and (suspected) exposure to covid-19: Secondary | ICD-10-CM | POA: Diagnosis present

## 2022-08-27 DIAGNOSIS — U071 COVID-19: Secondary | ICD-10-CM | POA: Diagnosis present

## 2022-08-27 MED ORDER — IPRATROPIUM BROMIDE 0.06 % NA SOLN: 2.0000 | Freq: Four times a day (QID) | NASAL | 0 refills | Status: DC

## 2022-08-27 MED ORDER — NIRMATRELVIR/RITONAVIR (PAXLOVID) TABLET (RENAL DOSING): ORAL_TABLET | ORAL | 0 refills | Status: DC

## 2022-08-27 NOTE — ED Triage Notes (Signed)
Patient here today with c/o fever, cough, and runny nose X 2 days. She has taken Tylenol last night with some relief of her headache. She stopped by a friends house and her daughter was ill. No recent travel.

## 2022-08-27 NOTE — ED Provider Notes (Signed)
HPI  SUBJECTIVE:  April Donaldson is a 89 y.o. female who presents with positive home COVID test 3 days ago.  Symptoms started 3 days ago.  She reports nasal congestion, dry cough, headache, chills, fevers Tmax 100.4, dry, irritated throat, clear rhinorrhea starting today, postnasal drip, decreased appetite and diarrhea starting this morning.  No body aches, sinus pain or pressure, facial swelling, upper dental pain, wheezing, shortness of breath, nausea, vomiting.  No known COVID exposure.  She had 5 doses of the COVID-vaccine.  No antipyretic in the past 6 hours.  She is able to sleep at night without waking up coughing.  She tried quarantining, lemon honey tea, and Tylenol.  Tea and Tylenol help.  No aggravating factors.  Patient has a past medical history of diabetes, CHF, hypertension, hypercholesterolemia, chronic kidney disease stage III/single kidney.  No history of pulmonary disease, COVID.  GFR in May 2024 40.82.  Past Medical History:  Diagnosis Date   Anxiety    Benign neoplasm of kidney, except pelvis    Cellulitis of leg 03/09/2013 hosp    Cerebrovascular disease    Chronic low back pain    Diabetes mellitus without complication (HCC) 10/25/2021   Diastolic CHF (HCC)    Dr Verdis Prime   Difficult intubation    per patient with hand surgery in 2016 at surgical center- lip was pinched and swollen after surgery- patient states dr Mina Marble never stated a problem with intubation    Diverticulosis    DJD (degenerative joint disease)    Dyspnea    with exertion    GERD (gastroesophageal reflux disease)    no meds   Glaucoma    Gout    H/O hiatal hernia    Hepatitis    hx of years ago    Hx of colonic polyps    Hypercholesteremia    Hypertension    Hypothyroid    IBS (irritable bowel syndrome)    Internal hemorrhoids    Obesity    OSA (obstructive sleep apnea)    NPSG 2009: AHI 20/h. CPAP - follows with [facility]   Renal insufficiency    one kidney small and non funtioning    Tubular adenoma 2000   Venous insufficiency     Past Surgical History:  Procedure Laterality Date   ABDOMINAL HYSTERECTOMY     APPENDECTOMY     CATARACT EXTRACTION, BILATERAL Bilateral 2017   cataracts Bilateral    COLONOSCOPY     CYSTOSCOPY/RETROGRADE/URETEROSCOPY Bilateral 04/09/2017   Procedure: CYSTOSCOPY/RETROGRADE;  Surgeon: Heloise Purpura, MD;  Location: [facility];  Service: Urology;  Laterality: Bilateral;  ONLY NEEDS 30 MIN FOR PROCEDURE   DILATION AND CURETTAGE OF UTERUS     FUNCTIONAL ENDOSCOPIC SINUS SURGERY  05/2003   Dr. Haroldine Laws   hysterctomy and ooperectomy  1970's   MASS EXCISION  2003   lt   MASS EXCISION Left 09/17/2013   Procedure: EXCISION MASS LEFT HAND;  Surgeon: Marlowe Shores, MD;  Location: [facility];  Service: Orthopedics;  Laterality: Left;   thyroid lobectomy for a cyst  10/2000   Dr. Haroldine Laws   TONSILLECTOMY      Family History  Problem Relation Age of Onset   Heart disease Mother    Diabetes Mother    Congestive Heart Failure Mother    Asthma Brother    Throat cancer Brother 58   Asthma Sister    Brain cancer Sister 49   Breast cancer Sister 26   Colon cancer  Neg Hx    Esophageal cancer Neg Hx    Stomach cancer Neg Hx    Pancreatic cancer Neg Hx     Social History   Tobacco Use   Smoking status: Never   Smokeless tobacco: Never  Vaping Use   Vaping Use: Never used  Substance Use Topics   Alcohol use: No    Alcohol/week: 0.0 standard drinks of alcohol   Drug use: No    No current facility-administered medications for this encounter.  Current Outpatient Medications:    ipratropium (ATROVENT) 0.06 % nasal spray, Place 2 sprays into both nostrils 4 (four) times daily., Disp: 15 mL, Rfl: 0   nirmatrelvir/ritonavir, renal dosing, (PAXLOVID) 10 x 150 MG & 10 x 100MG  TABS, Patient GFR is 40.82. Take nirmatrelvir (150 mg) one tablet twice daily for 5 days and ritonavir (100 mg) one tablet twice daily for 5 days., Disp: 20  tablet, Rfl: 0   allopurinol (ZYLOPRIM) 100 MG tablet, TAKE 1 TABLET BY MOUTH EVERY DAY, Disp: 90 tablet, Rfl: 2   aspirin 81 MG tablet, Take 81 mg by mouth daily., Disp: , Rfl:    atenolol (TENORMIN) 50 MG tablet, TAKE 2 TABLETS DAILY, Disp: 180 tablet, Rfl: 2   dorzolamide-timolol (COSOPT) 22.3-6.8 MG/ML ophthalmic solution, Place 1 drop into both eyes 2 (two) times daily., Disp: , Rfl:    ferrous sulfate 325 (65 FE) MG tablet, Take 1 tablet by mouth daily with breakfast., Disp: , Rfl:    latanoprost (XALATAN) 0.005 % ophthalmic solution, Place 1 drop into both eyes at bedtime., Disp: , Rfl:    levothyroxine (SYNTHROID) 50 MCG tablet, TAKE 1 TABLET BY MOUTH EVERY DAY BEFORE BREAKFAST, Disp: 90 tablet, Rfl: 3   lisinopril (ZESTRIL) 10 MG tablet, Take 10 mg by mouth as directed. Takes 10mg  in the am and 5mg  at bedtime, Disp: , Rfl:    lisinopril (ZESTRIL) 5 MG tablet, Take 5 mg by mouth at bedtime., Disp: , Rfl:    simvastatin (ZOCOR) 20 MG tablet, TAKE 1 TABLET DAILY AT 6PM, Disp: 90 tablet, Rfl: 3   triamterene-hydrochlorothiazide (DYAZIDE) 37.5-25 MG capsule, TAKE 1 CAPSULE BY MOUTH EVERY DAY, Disp: 90 capsule, Rfl: 1   Wheat Dextrin (BENEFIBER DRINK MIX PO), Take by mouth daily., Disp: , Rfl:   Allergies  Allergen Reactions   Amlodipine Besylate Swelling and Other (See Comments)    Causes swelling   Clonidine Nausea Only and Other (See Comments)    Sweating, felt faint   Diltiazem Hcl Swelling and Other (See Comments)    meds did not work for her----causes swelling   Klonopin [Clonazepam] Other (See Comments)    Vivid dreams   Tetracycline Swelling   Doxycycline Hyclate Other (See Comments)    REACTION:  tetracycline   Hydralazine Rash   Iodine Other (See Comments)    IVP contrast Pt. States topical iodine is fine to use without any irritation. H.Mickley RN   Labetalol Hcl Other (See Comments)    Unknown   Lidocaine Rash   Olmesartan Medoxomil Other (See Comments)    Unknown      ROS  As noted in HPI.   Physical Exam  BP (!) 169/75 (BP Location: Left Arm)   Pulse 62   Temp 99.8 F (37.7 C) (Oral)   Resp 16   Ht 4\' 11"  (1.499 m)   Wt 76.7 kg   SpO2 94%   BMI 34.13 kg/m   Constitutional: Well developed, well nourished, no acute distress Eyes:  EOMI, conjunctiva normal bilaterally HENT: Normocephalic, atraumatic,mucus membranes moist.  Mild nasal congestion. Neck: No cervical lymphadenopathy Respiratory: Normal inspiratory effort, lungs clear bilaterally Cardiovascular: Normal rate, regular rhythm, no appreciable murmurs, rubs, gallops GI: nondistended skin: No rash, skin intact Musculoskeletal: no deformities Neurologic: Alert & oriented x 3, no focal neuro deficits Psychiatric: Speech and behavior appropriate   ED Course   Medications - No data to display  Orders Placed This Encounter  Procedures   SARS CORONAVIRUS 2 (TAT 6-24 HRS) Anterior Nasal Swab    Standing Status:   Standing    Number of Occurrences:   1    No results found for this or any previous visit (from the past 24 hour(s)). No results found.  ED Clinical Impression  1. COVID-19 virus infection   2. Encounter for laboratory testing for COVID-19 virus      ED Assessment/Plan     Patient would like to confirm positive home COVID test.  Sending off COVID PCR.  Will prescribe renally dosed Paxlovid now.  Atrovent nasal spray as well.  Went through her drug interactions on up-to-date.  Reviewed previous labs.  Will advise her to discontinue the Zocor while taking the Paxlovid.  Continue Tylenol, lemon honey tea.  Follow-up with PCP as needed.  ER return precautions given.  COVID PCR pending at the time of initial signing of this note  Discussed labs, MDM, treatment plan, and plan for follow-up with patient. Discussed sn/sx that should prompt return to the ED. patient agrees with plan.   Meds ordered this encounter  Medications   ipratropium (ATROVENT) 0.06 %  nasal spray    Sig: Place 2 sprays into both nostrils 4 (four) times daily.    Dispense:  15 mL    Refill:  0   nirmatrelvir/ritonavir, renal dosing, (PAXLOVID) 10 x 150 MG & 10 x 100MG  TABS    Sig: Patient GFR is 40.82. Take nirmatrelvir (150 mg) one tablet twice daily for 5 days and ritonavir (100 mg) one tablet twice daily for 5 days.    Dispense:  20 tablet    Refill:  0      *This clinic note was created using Scientist, clinical (histocompatibility and immunogenetics). Therefore, there may be occasional mistakes despite careful proofreading.  ?    Domenick Gong, MD 08/27/22 [phone]

## 2022-08-27 NOTE — Discharge Instructions (Signed)
I have sent off a COVID PCR test.  Will contact you if it is positive.  I am prescribing Paxlovid, which is an antiviral that will decrease the severity and lessen the likelihood of you progressing to severe illness.  Start it if your COVID PCR is positive.  Otherwise, Atrovent nasal spray, continue Tylenol lemon honey tea.  Stop the Zocor 12 hours before starting the Paxlovid.  Do not take it during the 5 days of therapy with nirmatrelvir/ritonavir and for an additional 5 days after nirmatrelvir/ritonavir therapy has been completed.

## 2022-08-28 LAB — SARS CORONAVIRUS 2 (TAT 6-24 HRS): SARS Coronavirus 2: POSITIVE — AB

## 2022-08-29 ENCOUNTER — Telehealth: Payer: Self-pay | Admitting: Internal Medicine

## 2022-08-29 NOTE — Telephone Encounter (Signed)
Patient recently went to urgent care and was diagnosed with Covid. She was told to follow up with PCP, but Dr. Okey Dupre is out of the office. Patient did not want to schedule with anyone else until after she spoke with M[place]iah. She would like a call back at [phone].

## 2022-08-29 NOTE — Telephone Encounter (Signed)
Patient states that she isolated herself and did in home remedies. She tested positive last Friday so she is at her window grace period of exposure. Patient states that she is feeling much better but was concerned about the medication she was prescribed to certain health conditions.

## 2022-08-30 ENCOUNTER — Ambulatory Visit (INDEPENDENT_AMBULATORY_CARE_PROVIDER_SITE_OTHER): Payer: Medicare Other | Admitting: Emergency Medicine

## 2022-08-30 ENCOUNTER — Encounter: Payer: Self-pay | Admitting: Emergency Medicine

## 2022-08-30 VITALS — BP 138/88 | HR 55 | Temp 98.4°F | Ht 59.0 in | Wt 168.5 lb

## 2022-08-30 DIAGNOSIS — U071 COVID-19: Secondary | ICD-10-CM | POA: Insufficient documentation

## 2022-08-30 NOTE — Patient Instructions (Signed)

## 2022-08-30 NOTE — Progress Notes (Signed)
April Donaldson 89 y.o.   Chief Complaint  Patient presents with   Covid Positive    Patient stats she tested positive for Covid on Friday. Coughing , dry cough , has questions about the medications she was prescribed by the urgent care    HISTORY OF PRESENT ILLNESS: Acute problem before.  Patient of Dr. Hillard Danker. This is a 89 y.o. female developed flulike symptoms about 6 days ago.  Went to urgent care center.  Tested positive for COVID at home and at the center.  Today feeling better than a couple days ago.  Was prescribed Paxlovid but she never started it. No other complaints or medical concerns today  HPI   Prior to Admission medications   Medication Sig Start Date End Date Taking? Authorizing Provider  allopurinol (ZYLOPRIM) 100 MG tablet TAKE 1 TABLET BY MOUTH EVERY DAY 08/17/22  Yes Myrlene Broker, MD  aspirin 81 MG tablet Take 81 mg by mouth daily.   Yes [provider]  atenolol (TENORMIN) 50 MG tablet TAKE 2 TABLETS DAILY 01/13/22  Yes Myrlene Broker, MD  dorzolamide-timolol (COSOPT) 22.3-6.8 MG/ML ophthalmic solution Place 1 drop into both eyes 2 (two) times daily.   Yes [provider]  ferrous sulfate 325 (65 FE) MG tablet Take 1 tablet by mouth daily with breakfast.   Yes [provider]  ipratropium (ATROVENT) 0.06 % nasal spray Place 2 sprays into both nostrils 4 (four) times daily. 08/27/22  Yes Domenick Gong, MD  latanoprost (XALATAN) 0.005 % ophthalmic solution Place 1 drop into both eyes at bedtime.   Yes [provider]  levothyroxine (SYNTHROID) 50 MCG tablet TAKE 1 TABLET BY MOUTH EVERY DAY BEFORE BREAKFAST 11/16/21  Yes Myrlene Broker, MD  lisinopril (ZESTRIL) 10 MG tablet Take 10 mg by mouth as directed. Takes 10mg  in the am and 5mg  at bedtime 02/14/19  Yes [provider]  lisinopril (ZESTRIL) 5 MG tablet Take 5 mg by mouth at bedtime. 05/30/21  Yes [provider]  simvastatin  (ZOCOR) 20 MG tablet TAKE 1 TABLET DAILY AT 6PM 03/20/22  Yes Myrlene Broker, MD  triamterene-hydrochlorothiazide (DYAZIDE) 37.5-25 MG capsule TAKE 1 CAPSULE BY MOUTH EVERY DAY 08/11/22  Yes Myrlene Broker, MD  Wheat Dextrin (BENEFIBER DRINK MIX PO) Take by mouth daily.   Yes [provider]  nirmatrelvir/ritonavir, renal dosing, (PAXLOVID) 10 x 150 MG & 10 x 100MG  TABS Patient GFR is 40.82. Take nirmatrelvir (150 mg) one tablet twice daily for 5 days and ritonavir (100 mg) one tablet twice daily for 5 days. Patient not taking: Reported on 08/30/2022 08/27/22   Domenick Gong, MD    Allergies  Allergen Reactions   Amlodipine Besylate Swelling and Other (See Comments)    Causes swelling   Clonidine Nausea Only and Other (See Comments)    Sweating, felt faint   Diltiazem Hcl Swelling and Other (See Comments)    meds did not work for her----causes swelling   Klonopin [Clonazepam] Other (See Comments)    Vivid dreams   Tetracycline Swelling   Doxycycline Hyclate Other (See Comments)    REACTION:  tetracycline   Hydralazine Rash   Iodine Other (See Comments)    IVP contrast Pt. States topical iodine is fine to use without any irritation. H.Mickley RN   Labetalol Hcl Other (See Comments)    Unknown   Lidocaine Rash   Olmesartan Medoxomil Other (See Comments)    Unknown    Patient Active  Problem List   Diagnosis Date Noted   Allergic reaction 11/09/2021   Acquired deformity of lower leg 10/25/2021   Anemia 10/25/2021   Bilateral shoulder pain 08/12/2021   Cervical pain (neck) 07/28/2021   Bilateral carotid artery stenosis 03/25/2021   Grade II diastolic dysfunction 02/13/2021   Systolic murmur 01/02/2020   Numbness and tingling in left hand 06/26/2019   Candidal dermatitis 07/21/2016   Bilateral pseudophakia 04/10/2016   Routine general medical examination at a health care facility 06/15/2014   Primary open angle glaucoma of both eyes, mild stage 07/10/2011    Obstructive sleep apnea 11/11/2007   GLAUCOMA 09/22/2007   CKD (chronic kidney disease) stage 3, GFR 30-59 ml/min (HCC) 03/21/2007   Hypothyroidism 01/04/2007   Hyperlipidemia 01/04/2007   GOUT 01/04/2007   Essential hypertension 01/04/2007    Past Medical History:  Diagnosis Date   Anxiety    Benign neoplasm of kidney, except pelvis    Cellulitis of leg 03/09/2013 hosp    Cerebrovascular disease    Chronic low back pain    Diabetes mellitus without complication (HCC) 10/25/2021   Diastolic CHF (HCC)    Dr Verdis Prime   Difficult intubation    per patient with hand surgery in 2016 at surgical center- lip was pinched and swollen after surgery- patient states dr Mina Marble never stated a problem with intubation    Diverticulosis    DJD (degenerative joint disease)    Dyspnea    with exertion    GERD (gastroesophageal reflux disease)    no meds   Glaucoma    Gout    H/O hiatal hernia    Hepatitis    hx of years ago    Hx of colonic polyps    Hypercholesteremia    Hypertension    Hypothyroid    IBS (irritable bowel syndrome)    Internal hemorrhoids    Obesity    OSA (obstructive sleep apnea)    NPSG 2009: AHI 20/h. CPAP - follows with [facility]   Renal insufficiency    one kidney small and non funtioning   Tubular adenoma 2000   Venous insufficiency     Past Surgical History:  Procedure Laterality Date   ABDOMINAL HYSTERECTOMY     APPENDECTOMY     CATARACT EXTRACTION, BILATERAL Bilateral 2017   cataracts Bilateral    COLONOSCOPY     CYSTOSCOPY/RETROGRADE/URETEROSCOPY Bilateral 04/09/2017   Procedure: CYSTOSCOPY/RETROGRADE;  Surgeon: Heloise Purpura, MD;  Location: [facility];  Service: Urology;  Laterality: Bilateral;  ONLY NEEDS 30 MIN FOR PROCEDURE   DILATION AND CURETTAGE OF UTERUS     FUNCTIONAL ENDOSCOPIC SINUS SURGERY  05/2003   Dr. Haroldine Laws   hysterctomy and ooperectomy  1970's   MASS EXCISION  2003   lt   MASS EXCISION Left 09/17/2013   Procedure: EXCISION MASS  LEFT HAND;  Surgeon: Marlowe Shores, MD;  Location: [facility];  Service: Orthopedics;  Laterality: Left;   thyroid lobectomy for a cyst  10/2000   Dr. 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    Employer: RETIRED  Tobacco Use   Smoking status: Never   Smokeless tobacco: Never  Advertising account planner  Vaping Use: Never used  Substance and Sexual Activity   Alcohol use: No    Alcohol/week: 0.0 standard drinks of alcohol   Drug use: No   Sexual activity: Not Currently  Other Topics Concern   Not on file  Social History Narrative   Lives alone, indep - never married   g-dtr in [place], siblings, nieces in town   Keeps in touch with a few friends who talk regularly   Social Determinants of Health   Financial Resource Strain: Low Risk  (01/24/2022)   Overall Financial Resource Strain (CARDIA)    Difficulty of Paying Living Expenses: Not hard at all  Food Insecurity: No Food Insecurity (01/24/2022)   Hunger Vital Sign    Worried About Running Out of Food in the Last Year: Never true    Ran Out of Food in the Last Year: Never true  Transportation Needs: No Transportation Needs (01/24/2022)   PRAPARE - Administrator, Civil Service (Medical): No    Lack of Transportation (Non-Medical): No  Physical Activity: Sufficiently Active (01/24/2022)   Exercise Vital Sign    Days of Exercise per Week: 5 days    Minutes of Exercise per Session: 30 min  Stress: No Stress Concern Present (01/24/2022)   [employer] of Occupational Health - Occupational Stress Questionnaire    Feeling of Stress : Not at all  Social Connections: Moderately Isolated (01/24/2022)   Social Connection and Isolation Panel [NHANES]     Frequency of Communication with Friends and Family: More than three times a week    Frequency of Social Gatherings with Friends and Family: More than three times a week    Attends Religious Services: More than 4 times per year    Active Member of [employer] or Organizations: No    Attends Banker Meetings: Never    Marital Status: Widowed  Intimate Partner Violence: Not At Risk (01/24/2022)   Humiliation, Afraid, Rape, and Kick questionnaire    Fear of Current or Ex-Partner: No    Emotionally Abused: No    Physically Abused: No    Sexually Abused: No    Family History  Problem Relation Age of Onset   Heart disease Mother    Diabetes Mother    Congestive Heart Failure Mother    Asthma Brother    Throat cancer Brother 21   Asthma Sister    Brain cancer Sister 47   Breast cancer Sister 64   Colon cancer Neg Hx    Esophageal cancer Neg Hx    Stomach cancer Neg Hx    Pancreatic cancer Neg Hx      Review of Systems  Constitutional: Negative.  Negative for chills and fever.  HENT: Negative.  Negative for congestion and sore throat.   Respiratory: Negative.  Negative for shortness of breath.   Cardiovascular: Negative.  Negative for chest pain and palpitations.  Gastrointestinal:  Negative for abdominal pain, diarrhea, nausea and vomiting.  Genitourinary:  Negative for dysuria and hematuria.  Skin: Negative.  Negative for rash.  Neurological: Negative.  Negative for dizziness and headaches.  All other systems reviewed and are negative.   Vitals:   08/30/22 1107  BP: 138/88  Pulse: (!) 55  Temp: 98.4 F (36.9 C)  SpO2: 95%    Physical Exam Vitals reviewed.  Constitutional:      Appearance: Normal appearance.  HENT:     Head: Normocephalic.  Eyes:     Extraocular Movements: Extraocular movements intact.  Pupils: Pupils are equal, round, and reactive to light.  Cardiovascular:     Rate and Rhythm: Normal rate and regular rhythm.     Pulses: Normal  pulses.     Heart sounds: Normal heart sounds.  Pulmonary:     Effort: Pulmonary effort is normal.     Breath sounds: Normal breath sounds.  Musculoskeletal:     Cervical back: No tenderness.  Lymphadenopathy:     Cervical: No cervical adenopathy.  Skin:    General: Skin is warm and dry.     Capillary Refill: Capillary refill takes less than 2 seconds.  Neurological:     General: No focal deficit present.     Mental Status: She is alert and oriented to person, place, and time.  Psychiatric:        Mood and Affect: Mood normal.        Behavior: Behavior normal.      ASSESSMENT & PLAN: A total of 33 minutes was spent with the patient and counseling/coordination of care regarding preparing for this visit, review of most recent office visit notes, review of chronic medical conditions under management, review of all medications, recent diagnosis of COVID and management, prognosis, documentation and need for follow-up.  Problem List Items Addressed This Visit       Other   COVID-19 virus infection - Primary    Onset about 1 week ago.  Clinically much improved Stable without complications.  Running its course. Recently evaluated at urgent care center.  Paxlovid prescribed but she never started medication.  No need to start at present time. ED precautions given. Advised to contact the office if no better or worse during the next several days and follow-up with PCP as needed.      Patient Instructions  COVID-19 COVID-19 is an infection caused by a virus called SARS-CoV-2. This type of virus is called a coronavirus. People with COVID-19 may: Have little to no symptoms. Have mild to moderate symptoms that affect their lungs and breathing. Get very sick. What are the causes? COVID-19 is caused by a virus. This virus may be in the air as droplets or on surfaces. It can spread from an infected person when they cough, sneeze, speak, sing, or breathe. You may become infected if: You  breathe in the infected droplets in the air. You touch an object that has the virus on it. What increases the risk? You are at risk of getting COVID-19 if you have been around someone with the infection. You may be more likely to get very sick if: You are 77 years old or older. You have certain medical conditions, such as: Heart disease. Diabetes. Chronic respiratory disease. Cancer. Pregnancy. You are immunocompromised. This means your body cannot fight infections easily. You have a disability or trouble moving, meaning you're immobile. What are the signs or symptoms? People may have different symptoms from COVID-19. The symptoms can also be mild to severe. They often show up in 5-6 days after being infected. But they can take up to 14 days to appear. Common symptoms are: Cough. Feeling tired. New loss of taste or smell. Fever. Less common symptoms are: Sore throat. Headache. Body or muscle aches. Diarrhea. A skin rash or odd-colored fingers or toes. Red or irritated eyes. Sometimes, COVID-19 does not cause symptoms. How is this diagnosed? COVID-19 can be diagnosed with tests done in the lab or at home. Fluid from your nose, mouth, or lungs will be used to check for the virus. How  is this treated? Treatment for COVID-19 depends on how sick you are. Mild symptoms can be treated at home with rest, fluids, and over-the-counter medicines. Severe symptoms may be treated in a hospital intensive care unit (ICU). If you have symptoms and are at risk of getting very sick, you may be given a medicine that fights viruses. This medicine is called an antiviral. How is this prevented? To protect yourself from COVID-19: Know your risk factors. Get vaccinated. If your body cannot fight infections easily, talk to your provider about treatment to help prevent COVID-19. Stay at least 1 meter away from others. Wear a well-fitted mask when: You can't stay at a distance from people. You're in a  place with poor air flow. Try to be in open spaces with good air flow when in public. Wash your hands often or use an alcohol-based hand sanitizer. Cover your nose and mouth when coughing and sneezing. If you think you have COVID-19 or have been around someone who has it, stay home and be by yourself for 5-10 days. Where to find more information Centers for Disease Control and Prevention (CDC): TonerPromos.no World Health Organization [facility]): VisitDestination.com.br Get help right away if: You have trouble breathing or get short of breath. You have pain or pressure in your chest. You cannot speak or move any part of your body. You are confused. Your symptoms get worse. These symptoms may be an emergency. Get help right away. Call 911. Do not wait to see if the symptoms will go away. Do not drive yourself to the hospital. This information is not intended to replace advice given to you by your health care provider. Make sure you discuss any questions you have with your health care provider. Document Revised: 02/21/2022 Document Reviewed: 10/28/2021 Elsevier Patient Education  2024 Elsevier Inc.    Edwina Barth, MD [facility]

## 2022-08-30 NOTE — Assessment & Plan Note (Signed)
Onset about 1 week ago.  Clinically much improved Stable without complications.  Running its course. Recently evaluated at urgent care center.  Paxlovid prescribed but she never started medication.  No need to start at present time. ED precautions given. Advised to contact the office if no better or worse during the next several days and follow-up with PCP as needed.

## 2022-09-08 NOTE — Telephone Encounter (Signed)
Patient states still has not heard from someone to set up her appointment.

## 2022-09-08 NOTE — Telephone Encounter (Signed)
Atc patient just rings she is scheduled for 09/25/22 @ 11

## 2022-09-08 NOTE — Telephone Encounter (Signed)
Left detailed message for the patient.

## 2022-09-25 ENCOUNTER — Ambulatory Visit (HOSPITAL_BASED_OUTPATIENT_CLINIC_OR_DEPARTMENT_OTHER): Payer: Medicare Other | Attending: Pulmonary Disease | Admitting: Radiology

## 2022-09-25 DIAGNOSIS — Z789 Other specified health status: Secondary | ICD-10-CM

## 2022-09-25 DIAGNOSIS — G4733 Obstructive sleep apnea (adult) (pediatric): Secondary | ICD-10-CM

## 2022-09-26 LAB — HM DEXA SCAN: HM Dexa Scan: NORMAL

## 2022-09-26 LAB — HM MAMMOGRAPHY

## 2022-10-10 ENCOUNTER — Other Ambulatory Visit: Payer: Self-pay | Admitting: Internal Medicine

## 2022-10-25 ENCOUNTER — Encounter: Payer: Self-pay | Admitting: Internal Medicine

## 2022-10-25 ENCOUNTER — Ambulatory Visit (INDEPENDENT_AMBULATORY_CARE_PROVIDER_SITE_OTHER): Payer: Medicare Other | Admitting: Internal Medicine

## 2022-10-25 VITALS — BP 128/64 | HR 51 | Temp 98.2°F | Ht 59.0 in | Wt 165.0 lb

## 2022-10-25 DIAGNOSIS — N1832 Chronic kidney disease, stage 3b: Secondary | ICD-10-CM | POA: Diagnosis not present

## 2022-10-25 DIAGNOSIS — Z23 Encounter for immunization: Secondary | ICD-10-CM | POA: Diagnosis not present

## 2022-10-25 DIAGNOSIS — I10 Essential (primary) hypertension: Secondary | ICD-10-CM

## 2022-10-25 NOTE — Patient Instructions (Signed)
Let us know if you want an ear nose and throat doctor

## 2022-10-25 NOTE — Progress Notes (Signed)
   Subjective:   Patient ID: April Donaldson, female    DOB: 06/21/1931, 89 y.o.   MRN: [id number]  HPI The patient is an 89 YO female coming in for follow up.  Review of Systems  Constitutional: Negative.   HENT: Negative.    Eyes: Negative.   Respiratory:  Negative for cough, chest tightness and shortness of breath.   Cardiovascular:  Negative for chest pain, palpitations and leg swelling.  Gastrointestinal:  Negative for abdominal distention, abdominal pain, constipation, diarrhea, nausea and vomiting.  Musculoskeletal: Negative.   Skin: Negative.   Neurological: Negative.   Psychiatric/Behavioral: Negative.      Objective:  Physical Exam Constitutional:      Appearance: She is well-developed.  HENT:     Head: Normocephalic and atraumatic.  Cardiovascular:     Rate and Rhythm: Normal rate and regular rhythm.  Pulmonary:     Effort: Pulmonary effort is normal. No respiratory distress.     Breath sounds: Normal breath sounds. No wheezing or rales.  Abdominal:     General: Bowel sounds are normal. There is no distension.     Palpations: Abdomen is soft.     Tenderness: There is no abdominal tenderness. There is no rebound.  Musculoskeletal:     Cervical back: Normal range of motion.  Skin:    General: Skin is warm and dry.  Neurological:     Mental Status: She is alert and oriented to person, place, and time.     Coordination: Coordination normal.     Vitals:   10/25/22 1056  BP: 128/64  Pulse: (!) 51  Temp: 98.2 F (36.8 C)  TempSrc: Oral  SpO2: 98%  Weight: 165 lb (74.8 kg)  Height: 4\' 11"  (1.499 m)    Assessment & Plan:  Flu shot given at visit

## 2022-10-27 NOTE — Assessment & Plan Note (Signed)
BP at goal and reviewed labs from nephrology which had decline in renal function. Most recent labs from nephrology we do not have yet and patient was not informed about results yet.

## 2022-10-27 NOTE — Assessment & Plan Note (Signed)
Recent labs with nephrology earlier this week and we do not have results yet.

## 2022-10-31 ENCOUNTER — Telehealth: Payer: Self-pay | Admitting: Internal Medicine

## 2022-10-31 ENCOUNTER — Telehealth: Payer: Self-pay | Admitting: Cardiovascular Disease

## 2022-10-31 DIAGNOSIS — Z23 Encounter for immunization: Secondary | ICD-10-CM

## 2022-10-31 NOTE — Addendum Note (Signed)
Addended by: Levonne Lapping on: 10/31/2022 04:26 PM   Modules accepted: Orders

## 2022-10-31 NOTE — Telephone Encounter (Signed)
Received call transferred directly from operator and spoke with patient.  She reports she is feeling fine but she checks her BP and heart rate twice every morning.   Patient last saw Dr Katrinka Blazing in January 2023 and is to follow up as needed.  She is scheduled to see Dr Elease Hashimoto in October. BP medications prescribed by PCP.  At visit with Dr Katrinka Blazing in January 2023 patient's heart rate was 51.  Dr Katrinka Blazing recommended medication changes but patient did not want to make changes at that time. Patient saw PCP on 10/25/22 and heart rate was 51.  Patient reports the following readings- Today-  172/92, 57               156/84, 48  9/2-146/87, 43         151/86,49 9/1-147/86,54        121/76, 39  Patient has already taken AM medications today.  She takes atenolol 50 mg 2 tablets every AM.  I told her she could take one atenolol tablet tomorrow but to call PCP today for advice going forward as PCP office has been managing BP and heart rate. Patient will follow up with Dr Elease Hashimoto later this year as scheduled

## 2022-10-31 NOTE — Telephone Encounter (Signed)
STAT if HR is under 50 or over 120 (normal HR is 60-100 beats per minute)  What is your heart rate? 48    Do you have a log of your heart rate readings (document readings)?  156/84 HR 48 146/87 HR 43 147/86 HR 44  Do you have any other symptoms? No

## 2022-10-31 NOTE — Telephone Encounter (Signed)
Patient's pulse rate has been down to 172/97 - pulse 57, 156/84 pulse at 48 - Heart doctor has suggested cutting her atenolol from 2 tablets to 1 tablet.  Patient just wanted to know your thoughts on this.   Please call patient at [phone]

## 2022-10-31 NOTE — Telephone Encounter (Signed)
What was activity when HR 48 (I.e. activity sleeping etc) and was this 1 time? If 1 time okay to monitor at same

## 2022-11-01 ENCOUNTER — Ambulatory Visit (HOSPITAL_BASED_OUTPATIENT_CLINIC_OR_DEPARTMENT_OTHER): Payer: Medicare Other | Admitting: Pulmonary Disease

## 2022-11-01 ENCOUNTER — Encounter (HOSPITAL_BASED_OUTPATIENT_CLINIC_OR_DEPARTMENT_OTHER): Payer: Self-pay | Admitting: Pulmonary Disease

## 2022-11-01 VITALS — BP 128/72 | HR 52 | Resp 14 | Ht 59.0 in | Wt 168.1 lb

## 2022-11-01 DIAGNOSIS — G4733 Obstructive sleep apnea (adult) (pediatric): Secondary | ICD-10-CM

## 2022-11-01 NOTE — Telephone Encounter (Signed)
I would keep atenolol same (2 pills daily) until seen

## 2022-11-01 NOTE — Progress Notes (Signed)
[facility]  Chief Complaint  Patient presents with   Follow-up    OSA on CPAP    Constitutional:  BP 128/72   Pulse (!) 52   Resp 14   Ht 4\' 11"  (1.499 m)   Wt 168 lb 1.6 oz (76.2 kg)   SpO2 98%   BMI 33.95 kg/m   Past Medical History:  IBS, Hypothyroidism, HTN, HLD, Colon polyps, HH, Gout, Glaucoma, GERD, CVA, Anxiety, Cellulitis Lt leg 2015, Chronic back pain, Difficult intubation  Past Surgical History:  She  has a past surgical history that includes hysterctomy and ooperectomy (1970's); thyroid lobectomy for a cyst (10/2000); Functional endoscopic sinus surgery (05/2003); Colonoscopy; Tonsillectomy; Appendectomy; Dilation and curettage of uterus; Mass excision (2003); Mass excision (Left, 09/17/2013); cataracts (Bilateral); Abdominal hysterectomy; Cystoscopy/retrograde/ureteroscopy (Bilateral, 04/09/2017); and Cataract extraction, bilateral (Bilateral, 2017).  Brief Summary:  LILLY SPENSER is a 89 y.o. female with obstructive sleep apnea.      Subjective:   She got a new mask and works better.  She doesn't feel like her machine is delivering pressure effectively.  Feels more tired during the day.  Physical Exam:   Appearance - well kempt   ENMT - no sinus tenderness, no oral exudate, no LAN, Mallampati 3 airway, no stridor  Respiratory - equal breath sounds bilaterally, no wheezing or rales  CV - s1s2 regular rate and rhythm, no murmurs  Ext - no clubbing, no edema  Skin - no rashes  Psych - normal mood and affect   Sleep Tests:  PSG 10/14/07 >> AHI 20 Auto CPAP 03/14/20 to 06/11/20 >> used on 71 of 90 nights with average 5 hrs 25 min.  Average AHI 0.3 with median CPAP 9 and 95 th percentile CPAP 12 cm H2O  Cardiac Tests:  Echo 02/12/20 >> EF 55 to 60%, grade 2 DD  Social History:  She  reports that she has never smoked. She has never used smokeless tobacco. She reports that she does not drink alcohol and does not  use drugs.  Family History:  Her family history includes Asthma in her brother and sister; Brain cancer (age of onset: 28) in her sister; Breast cancer (age of onset: 93) in her sister; Congestive Heart Failure in her mother; Diabetes in her mother; Heart disease in her mother; Throat cancer (age of onset: 34) in her brother.     Assessment/Plan:   Obstructive sleep apnea. - mask fit better with [employer] FX standard size nasal mask - her current device is more than 87 yrs old and doesn't seem to be functioning properly - will arrange for new Resmed auto CPAP 5 to 15 cm H2O - she uses [employer] for her DME - explained she might need a new home sleep study prior to insurance authorizing a new CPAP machine  Chronic HFpEF. - previously followed by Dr. Verdis Prime with cardiology - discussed how sleep apnea can impact her diastolic function in relation to hypertension  Time Spent Involved in Patient Care on Day of Examination:  18 minutes  Follow up:   Patient Instructions  Will arrange for new auto CPAP machine.  Follow up with Dr. Wynona Neat at the [facility] office in 4 months.  Medication List:   Allergies as of 11/01/2022       Reactions   Amlodipine Besylate Swelling, Other (See Comments)   Causes swelling   Clonidine Nausea Only, Other (See Comments)   Sweating, felt faint   Diltiazem  Hcl Swelling, Other (See Comments)   meds did not work for her----causes swelling   Klonopin [clonazepam] Other (See Comments)   Vivid dreams   Tetracycline Swelling   Doxycycline Hyclate Other (See Comments)   REACTION:  tetracycline   Hydralazine Rash   Iodine Other (See Comments)   IVP contrast Pt. States topical iodine is fine to use without any irritation. H.Mickley RN   Labetalol Hcl Other (See Comments)   Unknown   Lidocaine Rash   Olmesartan Medoxomil Other (See Comments)   Unknown        Medication List        Accurate as of November 01, 2022 11:16 AM. If you have any  questions, ask your nurse or doctor.          allopurinol 100 MG tablet Commonly known as: ZYLOPRIM TAKE 1 TABLET BY MOUTH EVERY DAY   aspirin 81 MG tablet Take 81 mg by mouth daily.   atenolol 50 MG tablet Commonly known as: TENORMIN TAKE 2 TABLETS DAILY   BENEFIBER DRINK MIX PO Take by mouth daily.   dorzolamide-timolol 2-0.5 % ophthalmic solution Commonly known as: COSOPT Place 1 drop into both eyes 2 (two) times daily.   latanoprost 0.005 % ophthalmic solution Commonly known as: XALATAN Place 1 drop into both eyes at bedtime.   levothyroxine 50 MCG tablet Commonly known as: SYNTHROID TAKE 1 TABLET BY MOUTH EVERY DAY BEFORE BREAKFAST   lisinopril 10 MG tablet Commonly known as: ZESTRIL Take 10 mg by mouth as directed. Takes 10mg  in the am and 5mg  at bedtime   lisinopril 5 MG tablet Commonly known as: ZESTRIL Take 5 mg by mouth at bedtime.   simvastatin 20 MG tablet Commonly known as: ZOCOR TAKE 1 TABLET DAILY AT 6PM   triamterene-hydrochlorothiazide 37.5-25 MG capsule Commonly known as: DYAZIDE TAKE 1 CAPSULE BY MOUTH EVERY DAY        Signature:  Coralyn Helling, MD [facility] Pager - [phone] 11/01/2022, 11:16 AM

## 2022-11-01 NOTE — Telephone Encounter (Signed)
Pt states that it is her BP that she more concerned about and she was told to make you aware of her reading and she states that she does regular daily activity and pt states that she does not get dizzy. She is waiting on to be seen by Dr Elita Boone because that is what she was advised to do.

## 2022-11-01 NOTE — Patient Instructions (Signed)
Will arrange for new auto CPAP machine.  Follow up with Dr. Wynona Neat at the [facility] office in 4 months.

## 2022-11-10 NOTE — Addendum Note (Signed)
Addended by: Levonne Lapping on: 11/10/2022 08:42 AM   Modules accepted: Orders

## 2022-11-12 ENCOUNTER — Other Ambulatory Visit: Payer: Self-pay | Admitting: Internal Medicine

## 2022-11-15 ENCOUNTER — Ambulatory Visit: Payer: Medicare Other | Attending: Internal Medicine | Admitting: Internal Medicine

## 2022-11-15 ENCOUNTER — Encounter: Payer: Self-pay | Admitting: Internal Medicine

## 2022-11-15 ENCOUNTER — Ambulatory Visit (INDEPENDENT_AMBULATORY_CARE_PROVIDER_SITE_OTHER): Payer: Medicare Other

## 2022-11-15 ENCOUNTER — Other Ambulatory Visit: Payer: Self-pay | Admitting: Internal Medicine

## 2022-11-15 VITALS — BP 150/88 | HR 51 | Ht <= 58 in | Wt 166.0 lb

## 2022-11-15 DIAGNOSIS — R011 Cardiac murmur, unspecified: Secondary | ICD-10-CM

## 2022-11-15 DIAGNOSIS — I5032 Chronic diastolic (congestive) heart failure: Secondary | ICD-10-CM

## 2022-11-15 DIAGNOSIS — I10 Essential (primary) hypertension: Secondary | ICD-10-CM | POA: Diagnosis not present

## 2022-11-15 DIAGNOSIS — G4733 Obstructive sleep apnea (adult) (pediatric): Secondary | ICD-10-CM

## 2022-11-15 DIAGNOSIS — R001 Bradycardia, unspecified: Secondary | ICD-10-CM

## 2022-11-15 DIAGNOSIS — E782 Mixed hyperlipidemia: Secondary | ICD-10-CM

## 2022-11-15 MED ORDER — LISINOPRIL 10 MG PO TABS: 10.0000 mg | ORAL_TABLET | Freq: Two times a day (BID) | ORAL | 3 refills | Status: DC

## 2022-11-15 MED ORDER — LISINOPRIL 5 MG PO TABS
5.0000 mg | ORAL_TABLET | Freq: Every day | ORAL | 3 refills | Status: DC
Start: 2022-11-15 — End: 2023-12-28

## 2022-11-15 MED ORDER — LISINOPRIL 10 MG PO TABS: 10.0000 mg | ORAL_TABLET | Freq: Every morning | ORAL | 3 refills | Status: DC

## 2022-11-15 NOTE — Progress Notes (Unsigned)
ZIO XT serial # [id number] from office inventory applied to patient.

## 2022-11-15 NOTE — Addendum Note (Signed)
Addended by: Macie Burows on: 11/15/2022 02:39 PM   Modules accepted: Orders

## 2022-11-15 NOTE — Addendum Note (Signed)
Addended by: Macie Burows on: 11/15/2022 02:36 PM   Modules accepted: Orders

## 2022-11-15 NOTE — Patient Instructions (Addendum)
Dear April Donaldson, during your recent visit, we discussed your concerns about a slow heart rate (sinus bradycardia), high blood pressure (hypertension), high cholesterol (hyperlipidemia), a new heart murmur, and your obstructive sleep apnea. We have made some changes to your treatment plan and ordered some tests to better understand your health condition.  YOUR PLAN:  -ASYMPTOMATIC SINUS BRADYCARDIA: This is a condition where your heart rate is slower than normal, but you do not feel any symptoms. We will order a 1-week heart monitor to confirm there are no concerning heart rhythms.  -HYPERTENSION: This is a condition where your blood pressure is consistently too high. We will increase your Lisinopril dose to 10mg  twice daily and check your electrolytes in a couple of weeks due to this dose increase.  -HYPERLIPIDEMIA: This is a condition where you have high levels of fats (lipids) in your blood. We will check your lipid panel  and we reviewed your CT.  -HEART MURMUR: This is a sound heard during a heartbeat cycle, potentially due to abnormal blood flow. We will order a repeat echocardiogram to evaluate the murmur and check for any valve disease.  -OBSTRUCTIVE SLEEP APNEA: This is a condition where your breathing stops and starts during sleep. Please inform the clinic when your new CPAP machine is received, as this may impact your blood pressure control.   Medication Instructions:  Your physician has recommended you make the following change in your medication:  TAKE: lisinopril 10 mg by mouth in the morning and 5 mg by mouth at bedtime.   *If you need a refill on your cardiac medications before your next appointment, please call your pharmacy*   Lab Work: IN 2 WEEKS: fasting lipid panel (Nothing to eat or drink 12 hours prior except water)   If you have labs (blood work) drawn today and your tests are completely normal, you will receive your results only by: MyChart Message (if you have  MyChart) OR A paper copy in the mail If you have any lab test that is abnormal or we need to change your treatment, we will call you to review the results.   Testing/Procedures: Your physician has requested that you have an echocardiogram. Echocardiography is a painless test that uses sound waves to create images of your heart. It provides your doctor with information about the size and shape of your heart and how well your heart's chambers and valves are working. This procedure takes approximately one hour. There are no restrictions for this procedure. Please do NOT wear cologne, perfume, aftershave, or lotions (deodorant is allowed). Please arrive 15 minutes prior to your appointment time.  Your physician has requested that you wear a heart monitor.    Follow-Up: At [facility], you and your health needs are our priority.  As part of our continuing mission to provide you with exceptional heart care, we have created designated Provider Care Teams.  These Care Teams include your primary Cardiologist (physician) and Advanced Practice Providers (APPs -  Physician Assistants and Nurse Practitioners) who all work together to provide you with the care you need, when you need it.  We recommend signing up for the patient portal called "MyChart".  Sign up information is provided on this After Visit Summary.  MyChart is used to connect with patients for Virtual Visits (Telemedicine).  Patients are able to view lab/test results, encounter notes, upcoming appointments, etc.  Non-urgent messages can be sent to your provider as well.   To learn more about what you  can do with MyChart, go to [email or web address].    Your next appointment:   4-5 month(s)  Provider:   Robin Searing, NP, Jacolyn Reedy, PA-C, Eligha Bridegroom, NP, Tereso Newcomer, PA-C, or Perlie Gold, PA-C     Then, Christell Constant, MD will plan to see you again in 1 year(s).    Other Instructions ZIO XT- Long Term Monitor  Instructions  Your physician has requested you wear a ZIO patch monitor for 14 days.  This is a single patch monitor. [employer] supplies one patch monitor per enrollment. Additional stickers are not available. Please do not apply patch if you will be having a Nuclear Stress Test,   Cardiac CT, MRI, or Chest Xray during the period you would be wearing the  monitor. The patch cannot be worn during these tests. You cannot remove and re-apply the  ZIO XT patch monitor.  Your ZIO patch monitor will be mailed 3 day USPS to your address on file. It may take 3-5 days  to receive your monitor after you have been enrolled.  Once you have received your monitor, please review the enclosed instructions. Your monitor  has already been registered assigning a specific monitor serial # to you.  Billing and Patient Assistance Program Information  We have supplied [employer] with any of your insurance information on file for billing purposes. [employer] offers a sliding scale Patient Assistance Program for patients that do not have  insurance, or whose insurance does not completely cover the cost of the ZIO monitor.  You must apply for the Patient Assistance Program to qualify for this discounted rate.  To apply, please call [employer] at [phone], select option 4, select option 2, ask to apply for  Patient Assistance Program. Meredeth Ide will ask your household income, and how many people  are in your household. They will quote your out-of-pocket cost based on that information.  [employer] will also be able to set up a 48-month, interest-free payment plan if needed.  Applying the monitor   Shave hair from upper left chest.  Hold abrader disc by orange tab. Rub abrader in 40 strokes over the upper left chest as  indicated in your monitor instructions.  Clean area with 4 enclosed alcohol pads. Let dry.  Apply patch as indicated in monitor instructions. Patch will be placed under collarbone on left  side of chest with  arrow pointing upward.  Rub patch adhesive wings for 2 minutes. Remove white label marked "1". Remove the white  label marked "2". Rub patch adhesive wings for 2 additional minutes.  While looking in a mirror, press and release button in center of patch. A small green light will  flash 3-4 times. This will be your only indicator that the monitor has been turned on.  Do not shower for the first 24 hours. You may shower after the first 24 hours.  Press the button if you feel a symptom. You will hear a small click. Record Date, Time and  Symptom in the Patient Logbook.  When you are ready to remove the patch, follow instructions on the last 2 pages of Patient  Logbook. Stick patch monitor onto the last page of Patient Logbook.  Place Patient Logbook in the blue and white box. Use locking tab on box and tape box closed  securely. The blue and white box has prepaid postage on it. Please place it in the mailbox as  soon as possible. Your physician should have your test results approximately 7 days after the  monitor has been mailed back to [employer].  Call [facility] Customer Care at [phone] if you have questions regarding  your ZIO XT patch monitor. Call them immediately if you see an orange light blinking on your  monitor.  If your monitor falls off in less than 4 days, contact our Monitor department at [phone].  If your monitor becomes loose or falls off after 4 days call [employer] at [phone] for  suggestions on securing your monitor

## 2022-11-15 NOTE — Addendum Note (Signed)
Addended by: Riley Lam A on: 11/15/2022 02:31 PM   Modules accepted: Level of Service

## 2022-11-15 NOTE — Progress Notes (Addendum)
Cardiology Office Note:  .    Date:  11/15/2022  ID:  April Donaldson, DOB 1932-02-14, MRN [id number] PCP: Myrlene Broker, MD  [facility] Providers Cardiologist:  Christell Constant, MD     CC: Transition to new cardiologist  History of Present Illness: .    April Donaldson is a 89 y.o. female with HTN, drug allergies, and HLD.  She denies history of prior stroke. 2023: Saw Dr. Katrinka Blazing and reviewed questions.  Discussed the use of AI scribe software for clinical note transcription with the patient, who gave verbal consent to proceed.  History of Present Illness        [name], an 89 year old with a history of renal cell carcinoma, hyperlipidemia, hypertension, and obstructive sleep apnea, presents with concerns about asymptomatic sinus bradycardia. She reports her heart rate has been dropping to the 40s, which is a new development. Despite the low heart rate, she feels fine but notes that she needs to take breaks while doing household chores. She is on multiple medications, including atenolol, lisinopril, triamterene hydrochlorothiazide, and aspirin. She has multiple medication allergies, including reactions to diltiazem, amlodipine, hydralazine, olmesartan, clonidine, and lobetalol. She also has a contrast allergy. She uses a CPAP machine for her obstructive sleep apnea but is currently waiting for a new one. This correlates with the rise in ambulatory blood pressure.  No chest pain, no near syncope  Relevant histories: .  Social: from [place]. ROS: As per HPI.   Studies Reviewed: .   Cardiac Studies & Procedures       ECHOCARDIOGRAM  ECHOCARDIOGRAM COMPLETE 02/12/2020  Narrative ECHOCARDIOGRAM REPORT    Patient Name:   April Donaldson Date of Exam: 02/12/2020 Medical Rec #:  [id number]      Height:       59.0 in Accession #:    [id number]     Weight:       179.2 lb Date of Birth:  [birth year]     BSA:          1.760 m Patient Age:    88 years       BP:            196/108 mmHg Patient Gender: F              HR:           52 bpm. Exam Location:  [address]  Procedure: 2D Echo, Cardiac Doppler and Color Doppler  Indications:    R01.1 murmur  History:        Patient has prior history of Echocardiogram examinations, most recent 06/05/2018. CKD; Risk Factors:Hypertension, HLD and Sleep Apnea.  Sonographer:    Clearence Ped RCS Referring Phys: [id number] STACY J BURNS  IMPRESSIONS   1. Left ventricular ejection fraction, by estimation, is 55 to 60%. The left ventricle has normal function. The left ventricle has no regional wall motion abnormalities. Left ventricular diastolic parameters are consistent with Grade II diastolic dysfunction (pseudonormalization). 2. Right ventricular systolic function is normal. The right ventricular size is normal. There is normal pulmonary artery systolic pressure. 3. The mitral valve is grossly normal. Trivial mitral valve regurgitation. 4. The aortic valve is tricuspid. Aortic valve regurgitation is not visualized. 5. The inferior vena cava is normal in size with greater than 50% respiratory variability, suggesting right atrial pressure of 3 mmHg.  Comparison(s): A prior study was performed on 06/05/2006. No significant change from prior study. Prior images reviewed side by side.  FINDINGS Left Ventricle: Left ventricular ejection fraction, by estimation, is 55 to 60%. The left ventricle has normal function. The left ventricle has no regional wall motion abnormalities. The left ventricular internal cavity size was normal in size. There is no left ventricular hypertrophy. Left ventricular diastolic parameters are consistent with Grade II diastolic dysfunction (pseudonormalization).  Right Ventricle: The right ventricular size is normal. No increase in right ventricular wall thickness. Right ventricular systolic function is normal. There is normal pulmonary artery systolic pressure. The tricuspid regurgitant velocity  is 2.28 m/s, and with an assumed right atrial pressure of 3 mmHg, the estimated right ventricular systolic pressure is 23.8 mmHg.  Left Atrium: Left atrial size was normal in size.  Right Atrium: Right atrial size was normal in size.  Pericardium: There is no evidence of pericardial effusion.  Mitral Valve: The mitral valve is grossly normal. Trivial mitral valve regurgitation.  Tricuspid Valve: The tricuspid valve is grossly normal. Tricuspid valve regurgitation is trivial.  Aortic Valve: The aortic valve is tricuspid. Aortic valve regurgitation is not visualized.  Pulmonic Valve: The pulmonic valve was normal in structure. Pulmonic valve regurgitation is not visualized.  Aorta: The aortic root and ascending aorta are structurally normal, with no evidence of dilitation.  Venous: The inferior vena cava is normal in size with greater than 50% respiratory variability, suggesting right atrial pressure of 3 mmHg.  IAS/Shunts: The atrial septum is grossly normal.   LEFT VENTRICLE PLAX 2D LVIDd:         4.70 cm  Diastology LVIDs:         3.20 cm  LV e' medial:    5.44 cm/s LV PW:         1.00 cm  LV E/e' medial:  18.8 LV IVS:        0.60 cm  LV e' lateral:   9.34 cm/s LVOT diam:     1.80 cm  LV E/e' lateral: 10.9 LV SV:         68 LV SV Index:   39 LVOT Area:     2.54 cm   RIGHT VENTRICLE RV Basal diam:  2.40 cm TAPSE (M-mode): 2.4 cm RVSP:           23.8 mmHg  LEFT ATRIUM             Index       RIGHT ATRIUM           Index LA diam:        3.60 cm 2.05 cm/m  RA Pressure: 3.00 mmHg LA Vol (A2C):   50.5 ml 28.69 ml/m RA Area:     13.00 cm LA Vol (A4C):   43.6 ml 24.77 ml/m RA Volume:   28.30 ml  16.08 ml/m LA Biplane Vol: 47.0 ml 26.70 ml/m AORTIC VALVE LVOT Vmax:   108.00 cm/s LVOT Vmean:  74.000 cm/s LVOT VTI:    0.268 m  AORTA Ao Root diam: 2.70 cm Ao Asc diam:  3.10 cm  MITRAL VALVE                TRICUSPID VALVE MV Area (PHT):              TR Peak grad:    20.8 mmHg MV Decel Time:              TR Vmax:        228.00 cm/s MV E velocity: 102.00 cm/s  Estimated RAP:  3.00 mmHg MV A velocity: 103.00  cm/s  RVSP:           23.8 mmHg MV E/A ratio:  0.99 SHUNTS Systemic VTI:  0.27 m Systemic Diam: 1.80 cm  Riley Lam MD Electronically signed by Riley Lam MD Signature Date/Time: 02/12/2020/3:42:09 PM    Final              Physical Exam:    VS:  BP (!) 150/88   Pulse (!) 51   Ht 4\' 9"  (1.448 m)   Wt 166 lb (75.3 kg)   BMI 35.92 kg/m    Wt Readings from Last 3 Encounters:  11/15/22 166 lb (75.3 kg)  11/01/22 168 lb 1.6 oz (76.2 kg)  10/25/22 165 lb (74.8 kg)    Gen: no distress, morbid obesity Neck: No JVD Cardiac: No Rubs or Gallops, systolic Murmur, regular bradycardia, +2 radial pulses Respiratory: Clear to auscultation bilaterally, normal effort, normal  respiratory rate GI: Soft, nontender, non-distended  MS: No  edema;  moves all extremities Integument: Skin feels warm Neuro:  At time of evaluation, alert and oriented to person/place/time/situation  Psych: Normal affect, patient feels well   ASSESSMENT AND PLAN: .    Asymptomatic Sinus Bradycardia - Heart rate in the 40s, patient reports feeling fine. No evidence of high-degree AV block or complete heart block. -Order 1-week heart monitor to confirm absence of concerning arrhythmias. - ok to continue beta blocker  Hypertension - Elevated blood pressure readings at home and in clinic. Patient currently on Lisinopril and Triamterene-Hydrochlorothiazide. Multiple medication allergies noted. -Increase Lisinopril to 10mg  twice daily. -Check electrolytes in a couple of weeks due to Lisinopril dose increase. - likely related to stopping CPAP, may go back to 10 mg AM and 5 mg PM if she gets her CPAP part  Obstructive Sleep Apnea Patient currently without CPAP machine, awaiting new one. -Advise patient to inform clinic when new CPAP machine is received,  as this may impact blood pressure control.  Hyperlipidemia LDL slightly above goal. Evidence of aortic atherosclerosis and coronary artery calcification on prior imaging; reviewed CT with patient - lipids with  -Consider more aggressive lipid-lowering therapy based on results.  Heart Murmur - New murmur detected on examination. Suspect aortic sclerosis -Order repeat echocardiogram to evaluate murmur and assess for valvular disease.  Winter APP f/u  Riley Lam, MD FASE [facility]  [address] [place], [address] [place], [place] [address] [phone]  1:59 PM  Patient would like to come back from more questions. She notes a tumor on her urtethra but denies kidney cancer. She has concerns for her CKD STAGE IIIa.  She would like to not increase her ACE inhibitor. She would no longer like to make any changes to her medications. Will change back.  Time Spent Directly with Patient:   I have spent a total of 41 minutes with the patient reviewing notes, imaging, EKGs, labs and examining the patient as well as establishing an assessment and plan that was discussed personally with the patient.  > 50% of time was spent in direct patient care.

## 2022-11-29 ENCOUNTER — Other Ambulatory Visit: Payer: Self-pay | Admitting: Internal Medicine

## 2022-11-29 ENCOUNTER — Ambulatory Visit: Payer: Medicare Other | Attending: Internal Medicine

## 2022-11-30 LAB — LIPID PANEL
Chol/HDL Ratio: 2.9 {ratio} (ref 0.0–4.4)
Cholesterol, Total: 184 mg/dL (ref 100–199)
HDL: 63 mg/dL (ref >39)
LDL Chol Calc (NIH): 101 mg/dL — ABNORMAL HIGH (ref 0–99)
Triglycerides: 113 mg/dL (ref 0–149)
VLDL Cholesterol Cal: 20 mg/dL (ref 5–40)

## 2022-12-05 ENCOUNTER — Ambulatory Visit (HOSPITAL_COMMUNITY): Payer: Medicare Other | Attending: Internal Medicine

## 2022-12-05 DIAGNOSIS — R011 Cardiac murmur, unspecified: Secondary | ICD-10-CM | POA: Diagnosis present

## 2022-12-05 DIAGNOSIS — I5032 Chronic diastolic (congestive) heart failure: Secondary | ICD-10-CM

## 2022-12-05 LAB — ECHOCARDIOGRAM COMPLETE
Area-P 1/2: 3.89 cm2
S' Lateral: 2.3 cm

## 2022-12-18 ENCOUNTER — Telehealth: Payer: Self-pay | Admitting: Pulmonary Disease

## 2022-12-18 DIAGNOSIS — G4733 Obstructive sleep apnea (adult) (pediatric): Secondary | ICD-10-CM

## 2022-12-18 NOTE — Telephone Encounter (Signed)
[employer] order to state "All Supplies" and pressure setting.

## 2022-12-21 NOTE — Telephone Encounter (Signed)
Order has been updated.  Brad with [employer] is aware and voiced his understanding.  Nothing further needed.

## 2022-12-26 ENCOUNTER — Ambulatory Visit: Payer: Medicare Other | Admitting: Cardiovascular Disease

## 2022-12-26 ENCOUNTER — Encounter: Payer: Medicare Other | Admitting: Rehabilitative and Restorative Service Providers"

## 2022-12-28 ENCOUNTER — Encounter: Payer: Medicare Other | Admitting: Rehabilitative and Restorative Service Providers"

## 2023-02-16 ENCOUNTER — Other Ambulatory Visit: Payer: Self-pay

## 2023-02-16 ENCOUNTER — Telehealth: Payer: Self-pay

## 2023-02-16 MED ORDER — TRIAMTERENE-HCTZ 37.5-25 MG PO CAPS: ORAL_CAPSULE | ORAL | 1 refills | Status: DC

## 2023-02-16 MED ORDER — LEVOTHYROXINE SODIUM 50 MCG PO TABS: 50.0000 ug | ORAL_TABLET | Freq: Every day | ORAL | 0 refills | Status: DC

## 2023-02-16 NOTE — Telephone Encounter (Signed)
Copied from CRM [id number]. Topic: Clinical - Medication Refill >> Feb 16, 2023  9:36 AM Orinda Kenner C wrote: Most Recent Primary Care Visit:  Provider: Hillard Danker A  Department: [facility]  Visit Type: OFFICE VISIT  Date: 10/25/2022  Medication: levothyroxine (SYNTHROID) 50 MCG tablet  triamterene-hydrochlorothiazide (DYAZIDE) 37.5-25 MG capsule    Has the patient contacted their pharmacy? Yes, the pharmacy advised pt to contact the office. They do not have any refills.  (Agent: If no, request that the patient contact the pharmacy for the refill. If patient does not wish to contact the pharmacy document the reason why and proceed with request.) (Agent: If yes, when and what did the pharmacy advise?)  Is this the correct pharmacy for this prescription? Yes If no, delete pharmacy and type the correct one.  This is the patient's preferred pharmacy:  [facility] [phone] [place], [place] - [address] [place] [address] Phone: [phone] Fax: [phone]  Has the prescription been filled recently?   Is the patient out of the medication? No, but almost out of meds, would like to refill before the weekends/holidays.   Has the patient been seen for an appointment in the last year OR does the patient have an upcoming appointment?   Can we respond through MyChart? No, pls c/b [phone]  Agent: Please be advised that Rx refills may take up to 3 business days. We ask that you follow-up with your pharmacy.

## 2023-03-12 ENCOUNTER — Ambulatory Visit: Payer: Medicare Other | Admitting: Pulmonary Disease

## 2023-03-12 ENCOUNTER — Encounter: Payer: Self-pay | Admitting: Pulmonary Disease

## 2023-03-12 VITALS — BP 138/68 | HR 56 | Temp 97.5°F | Ht <= 58 in | Wt 168.6 lb

## 2023-03-12 DIAGNOSIS — G4733 Obstructive sleep apnea (adult) (pediatric): Secondary | ICD-10-CM

## 2023-03-12 NOTE — Patient Instructions (Signed)
 Continue using your CPAP on a nightly basis  The download from your machine shows that the machine is working very well  Call us with significant concerns  Follow-up a year from now

## 2023-03-12 NOTE — Progress Notes (Signed)
 April Donaldson    [id number]    1931/10/22  Primary Care Physician:Crawford, Almarie LABOR, MD  Referring Physician: Rollene Almarie LABOR, MD [address] [place],  [place] [address]  Chief complaint:   Follow-up for obstructive sleep apnea  HPI:  Diagnosed with moderate obstructive sleep apnea about 2009 Has been using CPAP since  Recently got a new machine Auto CPAP 5-15  Tries to use it nightly  Compliant with use  History of hypothyroidism, hypertension, hyperlipidemia, glaucoma, gout, GERD, CVA, chronic back pain  Does not smoke  Outpatient Encounter Medications as of 03/12/2023  Medication Sig   allopurinol  (ZYLOPRIM ) 100 MG tablet TAKE 1 TABLET BY MOUTH EVERY DAY   aspirin  81 MG tablet Take 81 mg by mouth daily.   atenolol  (TENORMIN ) 50 MG tablet TAKE 2 TABLETS DAILY   dorzolamide -timolol  (COSOPT ) 22.3-6.8 MG/ML ophthalmic solution Place 1 drop into both eyes 2 (two) times daily.   latanoprost  (XALATAN ) 0.005 % ophthalmic solution Place 1 drop into both eyes at bedtime.   levothyroxine  (SYNTHROID ) 50 MCG tablet Take 1 tablet (50 mcg total) by mouth daily before breakfast.   lisinopril  (ZESTRIL ) 10 MG tablet Take 1 tablet (10 mg total) by mouth in the morning.   simvastatin  (ZOCOR ) 20 MG tablet TAKE 1 TABLET DAILY AT 6PM   triamterene -hydrochlorothiazide  (DYAZIDE ) 37.5-25 MG capsule TAKE 1 CAPSULE BY MOUTH EVERY DAY   Wheat Dextrin (BENEFIBER DRINK MIX PO) Take by mouth daily.   lisinopril  (ZESTRIL ) 5 MG tablet Take 1 tablet (5 mg total) by mouth at bedtime.   No facility-administered encounter medications on file as of 03/12/2023.    Allergies as of 03/12/2023 - Review Complete 03/12/2023  Allergen Reaction Noted   Amlodipine  besylate Swelling and Other (See Comments)    Clonidine Nausea Only and Other (See Comments) 03/27/2006   Diltiazem hcl Swelling and Other (See Comments)    Klonopin  [clonazepam ] Other (See Comments) 09/17/2013    Tetracycline Swelling 03/19/2015   Doxycycline hyclate Other (See Comments) 03/27/2006   Hydralazine Rash 03/19/2015   Iodine Other (See Comments)    Labetalol hcl Other (See Comments) 03/27/2006   Lidocaine  Rash 10/14/2013   Olmesartan medoxomil Other (See Comments) 08/24/2008    Past Medical History:  Diagnosis Date   Anxiety    Benign neoplasm of kidney, except pelvis    Cellulitis of leg 03/09/2013 hosp    Cerebrovascular disease    Chronic low back pain    Diabetes mellitus without complication (HCC) 10/25/2021   Diastolic CHF (HCC)    Dr Victory Sharps   Difficult intubation    per patient with hand surgery in 2016 at surgical center- lip was pinched and swollen after surgery- patient states dr sissy never stated a problem with intubation    Diverticulosis    DJD (degenerative joint disease)    Dyspnea    with exertion    GERD (gastroesophageal reflux disease)    no meds   Glaucoma    Gout    H/O hiatal hernia    Hepatitis    hx of years ago    Hx of colonic polyps    Hypercholesteremia    Hypertension    Hypothyroid    IBS (irritable bowel syndrome)    Internal hemorrhoids    Obesity    OSA (obstructive sleep apnea)    NPSG 2009: AHI 20/h. CPAP - follows with [facility]   Renal insufficiency    one kidney  small and non funtioning   Tubular adenoma 2000   Venous insufficiency     Past Surgical History:  Procedure Laterality Date   ABDOMINAL HYSTERECTOMY     APPENDECTOMY     CATARACT EXTRACTION, BILATERAL Bilateral 2017   cataracts Bilateral    COLONOSCOPY     CYSTOSCOPY/RETROGRADE/URETEROSCOPY Bilateral 04/09/2017   Procedure: CYSTOSCOPY/RETROGRADE;  Surgeon: Renda Glance, MD;  Location: [facility];  Service: Urology;  Laterality: Bilateral;  ONLY NEEDS 30 MIN FOR PROCEDURE   DILATION AND CURETTAGE OF UTERUS     FUNCTIONAL ENDOSCOPIC SINUS SURGERY  05/2003   Dr. Floy   hysterctomy and ooperectomy  1970's   MASS EXCISION  2003   lt   MASS EXCISION Left  09/17/2013   Procedure: EXCISION MASS LEFT HAND;  Surgeon: Donnice DELENA Robinsons, MD;  Location: [facility];  Service: Orthopedics;  Laterality: Left;   thyroid  lobectomy for a cyst  10/2000   Dr. Floy   TONSILLECTOMY      Family History  Problem Relation Age of Onset   Heart disease Mother    Diabetes Mother    Congestive Heart Failure Mother    Asthma Brother    Throat cancer Brother 11   Asthma Sister    Brain cancer Sister 63   Breast cancer Sister 46   Colon cancer Neg Hx    Esophageal cancer Neg Hx    Stomach cancer Neg Hx    Pancreatic cancer Neg Hx     Social History   Socioeconomic History   Marital status: Single    Spouse name: Not on file   Number of children: 1   Years of education: Not on file   Highest education level: Not on file  Occupational History   Occupation: retired from worked in [employer], Hospital: RETIRED  Tobacco Use   Smoking status: Never   Smokeless tobacco: Never  Vaping Use   Vaping status: Never Used  Substance and Sexual Activity   Alcohol use: No    Alcohol/week: 0.0 standard drinks of alcohol   Drug use: No   Sexual activity: Not Currently  Other Topics Concern   Not on file  Social History Narrative   Lives alone, indep - never married   g-dtr in [place], siblings, nieces in town   Keeps in touch with a few friends who talk regularly   Social Drivers of Health   Financial Resource Strain: Low Risk  (01/24/2022)   Overall Financial Resource Strain (CARDIA)    Difficulty of Paying Living Expenses: Not hard at all  Food Insecurity: No Food Insecurity (01/24/2022)   Hunger Vital Sign    Worried About Running Out of Food in the Last Year: Never true    Ran Out of Food in the Last Year: Never true  Transportation Needs: No Transportation Needs (01/24/2022)   PRAPARE - Administrator, Civil Service (Medical): No    Lack of Transportation (Non-Medical): No  Physical Activity: Sufficiently  Active (01/24/2022)   Exercise Vital Sign    Days of Exercise per Week: 5 days    Minutes of Exercise per Session: 30 min  Stress: No Stress Concern Present (01/24/2022)   [employer] of Occupational Health - Occupational Stress Questionnaire    Feeling of Stress : Not at all  Social Connections: Moderately Isolated (01/24/2022)   Social Connection and Isolation Panel [NHANES]    Frequency of Communication with Friends and Family: More than three times  a week    Frequency of Social Gatherings with Friends and Family: More than three times a week    Attends Religious Services: More than 4 times per year    Active Member of Clubs or Organizations: No    Attends Banker Meetings: Never    Marital Status: Widowed  Intimate Partner Violence: Not At Risk (01/24/2022)   Humiliation, Afraid, Rape, and Kick questionnaire    Fear of Current or Ex-Partner: No    Emotionally Abused: No    Physically Abused: No    Sexually Abused: No    Review of Systems  Respiratory:  Positive for apnea.   Psychiatric/Behavioral:  Positive for sleep disturbance.     Vitals:   03/12/23 1044  BP: 138/68  Pulse: (!) 56  Temp: (!) 97.5 F (36.4 C)  SpO2: 99%     Physical Exam Constitutional:      Appearance: She is obese.  HENT:     Head: Normocephalic.     Mouth/Throat:     Mouth: Mucous membranes are moist.  Eyes:     General: No scleral icterus. Cardiovascular:     Rate and Rhythm: Normal rate and regular rhythm.     Heart sounds: No murmur heard.    No friction rub.  Pulmonary:     Effort: No respiratory distress.     Breath sounds: No stridor. No wheezing or rhonchi.  Musculoskeletal:     Cervical back: No rigidity or tenderness.  Neurological:     Mental Status: She is alert.  Psychiatric:        Mood and Affect: Mood normal.    Data Reviewed: Download from the machine reviewed showing 87% compliance 95 percentile pressure of 12.5 Auto setting 5-15 Residual  AHI of 0.2  Records were reviewed, previous visits with Dr. Shellia reviewed  Assessment:  Moderate obstructive sleep apnea on -Adequately treated with CPAP therapy -Feels much better with CPAP -Wakes up feeling rested  Obesity -She does try to stay active  History of heart failure with preserved ejection fraction -Follows up with cardiology  Plan/Recommendations: Encouraged to continue using CPAP on a nightly basis  Graded activities as tolerated  Follow-up a year from now  Encouraged to call with significant concerns   Jennet Epley MD [facility] 03/12/2023, 11:05 AM  CC: Rollene Almarie LABOR, *

## 2023-03-13 ENCOUNTER — Other Ambulatory Visit: Payer: Self-pay | Admitting: Internal Medicine

## 2023-03-24 LAB — LAB REPORT - SCANNED
Albumin, Urine POC: 158.4
Calcium: 9.5
Creatinine, POC: 41.6 mg/dL
EGFR: 45
Microalb Creat Ratio: 381
PTH: 109

## 2023-03-26 ENCOUNTER — Ambulatory Visit: Payer: Self-pay | Admitting: Internal Medicine

## 2023-03-26 NOTE — Progress Notes (Unsigned)
    Subjective:    Patient ID: April Donaldson, female    DOB: 1931/03/14, 89 y.o.   MRN: [id number]      HPI April Donaldson is here for No chief complaint on file.   Called 1/27 and spoke with triage nurse.  She stated 2-3 days of loose stools, weakness, decreased p.o. intake.  She stated having diarrhea 4-6 times per day.  She denies any abdominal pain and no vomiting.  There were no signs of dehydration.  She also stated cough and loss of appetite.       Medications and allergies reviewed with patient and updated if appropriate.  Current Outpatient Medications on File Prior to Visit  Medication Sig Dispense Refill   allopurinol (ZYLOPRIM) 100 MG tablet TAKE 1 TABLET BY MOUTH EVERY DAY 90 tablet 2   aspirin 81 MG tablet Take 81 mg by mouth daily.     atenolol (TENORMIN) 50 MG tablet TAKE 2 TABLETS DAILY 180 tablet 2   dorzolamide-timolol (COSOPT) 22.3-6.8 MG/ML ophthalmic solution Place 1 drop into both eyes 2 (two) times daily.     latanoprost (XALATAN) 0.005 % ophthalmic solution Place 1 drop into both eyes at bedtime.     levothyroxine (SYNTHROID) 50 MCG tablet Take 1 tablet (50 mcg total) by mouth daily before breakfast. 90 tablet 0   lisinopril (ZESTRIL) 10 MG tablet Take 1 tablet (10 mg total) by mouth in the morning. 90 tablet 3   lisinopril (ZESTRIL) 5 MG tablet Take 1 tablet (5 mg total) by mouth at bedtime. 90 tablet 3   simvastatin (ZOCOR) 20 MG tablet TAKE 1 TABLET DAILY AT 6PM 90 tablet 3   triamterene-hydrochlorothiazide (DYAZIDE) 37.5-25 MG capsule TAKE 1 CAPSULE BY MOUTH EVERY DAY 90 capsule 1   Wheat Dextrin (BENEFIBER DRINK MIX PO) Take by mouth daily.     No current facility-administered medications on file prior to visit.    Review of Systems     Objective:  There were no vitals filed for this visit. BP Readings from Last 3 Encounters:  03/12/23 138/68  11/15/22 (!) 150/88  11/01/22 128/72   Wt Readings from Last 3 Encounters:  03/12/23 168 lb 9.6 oz  (76.5 kg)  11/15/22 166 lb (75.3 kg)  11/01/22 168 lb 1.6 oz (76.2 kg)   There is no height or weight on file to calculate BMI.    Physical Exam         Assessment & Plan:    See Problem List for Assessment and Plan of chronic medical problems.

## 2023-03-26 NOTE — Telephone Encounter (Signed)
  Chief Complaint: Diarrhea  Symptoms: Loose  Frequency: 2-3 Days  Pertinent Negatives: Patient denies signs of dehydration, or dizziness, or distress of any kind.  Disposition: [] ED /[] Urgent Care (no appt availability in office) / [x] Appointment(In office/virtual)/ []  [facility]/ [] Home Care/ [] Refused Recommended Disposition /[] [facility]/ [x]  Follow-up with PCP Additional Notes: QB is a 89 year old being triaged for loose stools and concerns about weakness and malnutrition. Advised patient to keep appointment for tomorrow and to stay hydrated as much as possible.    Reason for Disposition  [1] MODERATE diarrhea (e.g., 4-6 times / day more than normal) AND [2] present > 48 hours (2 days)  Answer Assessment - Initial Assessment Questions 1. DIARRHEA SEVERITY: "How bad is the diarrhea?" "How many more stools have you had in the past 24 hours than normal?"    - NO DIARRHEA (SCALE 0)   - MILD (SCALE 1-3): Few loose or mushy BMs; increase of 1-3 stools over normal daily number of stools; mild increase in ostomy output.   -  MODERATE (SCALE 4-7): Increase of 4-6 stools daily over normal; moderate increase in ostomy output.   -  SEVERE (SCALE 8-10; OR "WORST POSSIBLE"): Increase of 7 or more stools daily over normal; moderate increase in ostomy output; incontinence.     3 Times today  2. ONSET: "When did the diarrhea begin?"      2-3 Days  3. BM CONSISTENCY: "How loose or watery is the diarrhea?"      Loose  4. VOMITING: "Are you also vomiting?" If Yes, ask: "How many times in the past 24 hours?"      No  5. ABDOMEN PAIN: "Are you having any abdomen pain?" If Yes, ask: "What does it feel like?" (e.g., crampy, dull, intermittent, constant)      NO  6. ABDOMEN PAIN SEVERITY: If present, ask: "How bad is the pain?"  (e.g., Scale 1-10; mild, moderate, or severe)   - MILD (1-3): doesn't interfere with normal activities, abdomen soft and not tender to touch    -  MODERATE (4-7): interferes with normal activities or awakens from sleep, abdomen tender to touch    - SEVERE (8-10): excruciating pain, doubled over, unable to do any normal activities       No  7. ORAL INTAKE: If vomiting, "Have you been able to drink liquids?" "How much liquids have you had in the past 24 hours?"     No  8. HYDRATION: "Any signs of dehydration?" (e.g., dry mouth [not just dry lips], too weak to stand, dizziness, new weight loss) "When did you last urinate?"     No  9. EXPOSURE: "Have you traveled to a foreign country recently?" "Have you been exposed to anyone with diarrhea?" "Could you have eaten any food that was spoiled?"     No  10. ANTIBIOTIC USE: "Are you taking antibiotics now or have you taken antibiotics in the past 2 months?"       No  11. OTHER SYMPTOMS: "Do you have any other symptoms?" (e.g., fever, blood in stool)       Cough, Loss of appetite  12. PREGNANCY: "Is there any chance you are pregnant?" "When was your last menstrual period?"       NO  Protocols used: [facility]

## 2023-03-27 ENCOUNTER — Encounter: Payer: Self-pay | Admitting: Internal Medicine

## 2023-03-27 ENCOUNTER — Ambulatory Visit (INDEPENDENT_AMBULATORY_CARE_PROVIDER_SITE_OTHER): Payer: Medicare Other | Admitting: Internal Medicine

## 2023-03-27 ENCOUNTER — Ambulatory Visit (INDEPENDENT_AMBULATORY_CARE_PROVIDER_SITE_OTHER): Payer: Medicare Other

## 2023-03-27 VITALS — BP 128/68 | HR 52 | Temp 98.4°F | Ht <= 58 in | Wt 166.0 lb

## 2023-03-27 DIAGNOSIS — J069 Acute upper respiratory infection, unspecified: Secondary | ICD-10-CM

## 2023-03-27 DIAGNOSIS — J209 Acute bronchitis, unspecified: Secondary | ICD-10-CM | POA: Diagnosis not present

## 2023-03-27 MED ORDER — CEFDINIR 300 MG PO CAPS: 300.0000 mg | ORAL_CAPSULE | Freq: Two times a day (BID) | ORAL | 0 refills | Status: DC

## 2023-03-27 NOTE — Patient Instructions (Addendum)
      Have a chest xray downstairs    Medications changes include :   None       Return if symptoms worsen or fail to improve.

## 2023-03-28 LAB — POC INFLUENZA A&B (BINAX/QUICKVUE)
Influenza A, POC: NEGATIVE
Influenza B, POC: NEGATIVE

## 2023-03-28 LAB — POC COVID19 BINAXNOW: SARS Coronavirus 2 Ag: NEGATIVE

## 2023-03-28 NOTE — Addendum Note (Signed)
Addended by: Karma Ganja on: 03/28/2023 04:30 PM   Modules accepted: Orders

## 2023-03-29 ENCOUNTER — Telehealth: Payer: Self-pay | Admitting: Internal Medicine

## 2023-03-29 MED ORDER — AMOXICILLIN-POT CLAVULANATE 875-125 MG PO TABS: 1.0000 | ORAL_TABLET | Freq: Two times a day (BID) | ORAL | 0 refills | Status: DC

## 2023-03-29 MED ORDER — FLUCONAZOLE 150 MG PO TABS: 150.0000 mg | ORAL_TABLET | Freq: Once | ORAL | 0 refills | Status: AC

## 2023-03-29 NOTE — Telephone Encounter (Signed)
See if she is ok with augmentin - if so pending

## 2023-03-29 NOTE — Addendum Note (Signed)
Addended by: Pincus Sanes on: 03/29/2023 03:50 PM   Modules accepted: Orders

## 2023-03-29 NOTE — Telephone Encounter (Signed)
Pt is concerned about potential side effects of cefdinir (OMNICEF) 300 MG capsule and is worried she that this will not be the rgith medication for her.   Please contact pt and clarify rx needs.

## 2023-04-08 ENCOUNTER — Encounter: Payer: Self-pay | Admitting: Internal Medicine

## 2023-04-08 NOTE — Progress Notes (Signed)
 Subjective:    Patient ID: April Donaldson, female    DOB: March 19, 1931, 89 y.o.   MRN: [id number]     HPI April Donaldson is here for follow up of her chronic medical problems.  Saw her 1/28 for URI symptoms.  She is feeling better.  She still has some mucus in her throat and chest.  That causes her to cough sometimes. She may bring up a little mucus.  No fever, sob or wheeze.    She had some diarrhea from the Augmentin .  It has gotten a little better.    Medications and allergies reviewed with patient and updated if appropriate.  Current Outpatient Medications on File Prior to Visit  Medication Sig Dispense Refill   allopurinol  (ZYLOPRIM ) 100 MG tablet TAKE 1 TABLET BY MOUTH EVERY DAY 90 tablet 2   aspirin  81 MG tablet Take 81 mg by mouth daily.     atenolol  (TENORMIN ) 50 MG tablet TAKE 2 TABLETS DAILY 180 tablet 2   dorzolamide -timolol  (COSOPT ) 22.3-6.8 MG/ML ophthalmic solution Place 1 drop into both eyes 2 (two) times daily.     latanoprost  (XALATAN ) 0.005 % ophthalmic solution Place 1 drop into both eyes at bedtime.     levothyroxine  (SYNTHROID ) 50 MCG tablet Take 1 tablet (50 mcg total) by mouth daily before breakfast. 90 tablet 0   lisinopril  (ZESTRIL ) 10 MG tablet Take 1 tablet (10 mg total) by mouth in the morning. 90 tablet 3   lisinopril  (ZESTRIL ) 5 MG tablet Take 1 tablet (5 mg total) by mouth at bedtime. 90 tablet 3   simvastatin  (ZOCOR ) 20 MG tablet TAKE 1 TABLET DAILY AT 6PM 90 tablet 3   triamterene -hydrochlorothiazide  (DYAZIDE ) 37.5-25 MG capsule TAKE 1 CAPSULE BY MOUTH EVERY DAY 90 capsule 1   Wheat Dextrin (BENEFIBER DRINK MIX PO) Take by mouth daily.     No current facility-administered medications on file prior to visit.     Review of Systems  Constitutional:  Negative for fever.  HENT:  Negative for congestion, ear pain, sinus pain and sore throat.   Respiratory:  Positive for cough (sometimes - occ can bring up some mucus - a little). Negative for chest  tightness, shortness of breath and wheezing.   Neurological:  Negative for dizziness and headaches.       Objective:   Vitals:   04/09/23 0844  BP: 130/80  Pulse: 80  Temp: 98.2 F (36.8 C)  SpO2: 96%   BP Readings from Last 3 Encounters:  04/09/23 130/80  03/27/23 128/68  03/12/23 138/68   Wt Readings from Last 3 Encounters:  04/09/23 162 lb (73.5 kg)  03/27/23 166 lb (75.3 kg)  03/12/23 168 lb 9.6 oz (76.5 kg)   Body mass index is 35.06 kg/m.    Physical Exam Constitutional:      General: She is not in acute distress.    Appearance: Normal appearance. She is not ill-appearing.  HENT:     Head: Normocephalic and atraumatic.  Eyes:     Conjunctiva/sclera: Conjunctivae normal.  Cardiovascular:     Rate and Rhythm: Normal rate and regular rhythm.  Pulmonary:     Effort: Pulmonary effort is normal. No respiratory distress.     Breath sounds: Normal breath sounds. No wheezing or rales.  Musculoskeletal:     Cervical back: Neck supple. No tenderness.  Lymphadenopathy:     Cervical: No cervical adenopathy.  Skin:    General: Skin is warm and dry.  Neurological:  Mental Status: She is alert.        Lab Results  Component Value Date   WBC 9.3 07/25/2022   HGB 12.4 07/25/2022   HCT 38.7 07/25/2022   PLT 331.0 07/25/2022   GLUCOSE 82 07/25/2022   CHOL 184 11/29/2022   TRIG 113 11/29/2022   HDL 63 11/29/2022   LDLCALC 101 (H) 11/29/2022   ALT 12 07/25/2022   AST 18 07/25/2022   NA 138 07/25/2022   K 4.2 07/25/2022   CL 102 07/25/2022   CREATININE 1.19 07/25/2022   BUN 23 07/25/2022   CO2 28 07/25/2022   TSH 3.83 03/27/2022   HGBA1C 5.5 10/25/2021     Assessment & Plan:    Chest congestion: Subacute Related to recent URI Completed antibiotic No need for additional treatment or evaluation Take mucinex  prn  Antibiotic associated diarrhea: Acute Related to Augmentin  Mild overall and improving Reassured - it should continue to  improve  Hypertension: Chronic Well controlled Continue atenolol  100 mg daily, lisinopril  10 mg daily, Dyazide  37.5-25 mg daily

## 2023-04-09 ENCOUNTER — Ambulatory Visit (INDEPENDENT_AMBULATORY_CARE_PROVIDER_SITE_OTHER): Payer: Medicare Other | Admitting: Internal Medicine

## 2023-04-09 VITALS — BP 130/80 | HR 80 | Temp 98.2°F | Ht <= 58 in | Wt 162.0 lb

## 2023-04-09 DIAGNOSIS — T3695XA Adverse effect of unspecified systemic antibiotic, initial encounter: Secondary | ICD-10-CM

## 2023-04-09 DIAGNOSIS — K521 Toxic gastroenteritis and colitis: Secondary | ICD-10-CM | POA: Diagnosis not present

## 2023-04-09 DIAGNOSIS — I10 Essential (primary) hypertension: Secondary | ICD-10-CM

## 2023-04-09 DIAGNOSIS — R0989 Other specified symptoms and signs involving the circulatory and respiratory systems: Secondary | ICD-10-CM

## 2023-04-09 NOTE — Patient Instructions (Addendum)
        Medications changes include :   take mucinex  as needed        Return if symptoms worsen or fail to improve.

## 2023-04-09 NOTE — Progress Notes (Signed)
Cardiology Office Note    Patient Name: April Donaldson Date of Encounter: 04/10/2023  Primary Care Provider:  Myrlene Broker, MD Primary Cardiologist:  Christell Constant, MD Primary Electrophysiologist: None   Past Medical History    Past Medical History:  Diagnosis Date   Anxiety    Benign neoplasm of kidney, except pelvis    Cellulitis of leg 03/09/2013 hosp    Cerebrovascular disease    Chronic low back pain    Diabetes mellitus without complication (HCC) 10/25/2021   Diastolic CHF (HCC)    Dr Verdis Prime   Difficult intubation    per patient with hand surgery in 2016 at surgical center- lip was pinched and swollen after surgery- patient states dr Mina Marble never stated a problem with intubation    Diverticulosis    DJD (degenerative joint disease)    Dyspnea    with exertion    GERD (gastroesophageal reflux disease)    no meds   Glaucoma    Gout    H/O hiatal hernia    Hepatitis    hx of years ago    Hx of colonic polyps    Hypercholesteremia    Hypertension    Hypothyroid    IBS (irritable bowel syndrome)    Internal hemorrhoids    Obesity    OSA (obstructive sleep apnea)    NPSG 2009: AHI 20/h. CPAP - follows with [facility]   Renal insufficiency    one kidney small and non funtioning   Tubular adenoma 2000   Venous insufficiency     History of Present Illness  April Donaldson is a 89 y.o. female with a PMH of HTN, HLD, CVA, renal cell carcinoma, hypothyroidism, OSA, bradycardia who presents today for 69-month follow-up.  April Donaldson was followed initially by Dr. Eden Emms and is currently followed by Dr. Raynelle Jan.  She previously completed a nuclear stress test in 2018 that was negative and was started on CPAP for OSA in the past.  She was seen by Dr. Katrinka Blazing on 03/18/2021 and reported no chest pain and found to have bradycardia which was thought to be related to high-dose beta-blocker therapy.  She had reduction in her Tenormin at that time. She was seen  initially by Dr. Raynelle Jan on 11/15/2022.  During her visit she reported heart rates dropping to the 40s but continued to be asymptomatic. She did endorse having to take multiple breaks with household chores. She wore a 1 week ZIO that showed predominantly sinus with occasional paroxysmal SVT longest being 18 beats. 2D echo was also completed for murmur heard on exam that showed EF of 55 to 60% with no RWMA and mild concentric LVH with grade 2 DD and mildly dilated LA with mild calcification of the AV.  April Donaldson presents today for 50-month follow-up. She reports feeling generally well, but has been dealing with a lack of appetite and weakness due to a recent illness. The patient has been managing her heart condition with atenolol, which was adjusted from 100mg  to 50mg  due to a slow heart rate. She reports no recent episodes of irregular heartbeats and has been monitoring her blood pressure regularly at home. She also  has a history of sciatica, which has been managed with regular visits to a chiropractor. She reports some difficulty with physical activity due to the sciatica and has been considering ways to increase her activity levels. he patient's cholesterol levels have been a concern in the past. Her last results from October showed  elevated LDL levels, but the patient has not had her cholesterol checked since then. She expresses a willingness to have her cholesterol checked again.  Patient denies chest pain, palpitations, dyspnea, PND, orthopnea, nausea, vomiting, dizziness, syncope, edema, weight gain, or early satiety.  Review of Systems  Please see the history of present illness.    All other systems reviewed and are otherwise negative except as noted above.  Physical Exam    Wt Readings from Last 3 Encounters:  04/09/23 162 lb (73.5 kg)  03/27/23 166 lb (75.3 kg)  03/12/23 168 lb 9.6 oz (76.5 kg)   ZO:XWRUE were no vitals filed for this visit.,Body mass index is 35.06 kg/m. GEN: Well  nourished, well developed in no acute distress Neck: No JVD; No carotid bruits Pulmonary: Clear to auscultation without rales, wheezing or rhonchi  Cardiovascular: Normal rate. Regular rhythm. Normal S1. Normal S2.   Murmurs: There is no murmur.  ABDOMEN: Soft, non-tender, non-distended EXTREMITIES: Trace lower extremity edema and right lower extremity  EKG/LABS/ Recent Cardiac Studies   ECG personally reviewed by me today -none completed today  Risk Assessment/Calculations:          Lab Results  Component Value Date   WBC 9.3 07/25/2022   HGB 12.4 07/25/2022   HCT 38.7 07/25/2022   MCV 89.1 07/25/2022   PLT 331.0 07/25/2022   Lab Results  Component Value Date   CREATININE 1.19 07/25/2022   BUN 23 07/25/2022   NA 138 07/25/2022   K 4.2 07/25/2022   CL 102 07/25/2022   CO2 28 07/25/2022   Lab Results  Component Value Date   CHOL 184 11/29/2022   HDL 63 11/29/2022   LDLCALC 101 (H) 11/29/2022   TRIG 113 11/29/2022   CHOLHDL 2.9 11/29/2022    Lab Results  Component Value Date   HGBA1C 5.5 10/25/2021   Assessment & Plan    1.  Primary HTN: -Patient's blood pressure today was 138/80 -Continue atenolol 100 mg, lisinopril 15 mg Dyazide 37.5-25 mg -We will change atenolol to 50 mg once daily.  2.  History of bradycardia: -Completed and showed asymptomatic SVT with sinus bradycardia/sinus rhythm -She reports no episodes of palpitations or fatigue.  3.  History of OSA: -Patient uses CPAP but has not used it in the past two weeks due to respiratory symptoms. -Encourage patient to resume CPAP use.  4.  HLD: -LDL elevated on last check in October. -Order fasting lipid panel. -Continue Zocor 20 mg daily  Disposition: Follow-up with Christell Constant, MD or APP in 6 months   Signed, Napoleon Form, Leodis Rains, NP 04/10/2023, 11:26 AM [facility]

## 2023-04-10 ENCOUNTER — Ambulatory Visit: Payer: Medicare Other | Attending: Nurse Practitioner | Admitting: Nurse Practitioner

## 2023-04-10 ENCOUNTER — Encounter: Payer: Self-pay | Admitting: Nurse Practitioner

## 2023-04-10 ENCOUNTER — Other Ambulatory Visit: Payer: Self-pay

## 2023-04-10 VITALS — BP 138/80 | HR 57 | Ht <= 58 in | Wt 162.8 lb

## 2023-04-10 DIAGNOSIS — R001 Bradycardia, unspecified: Secondary | ICD-10-CM

## 2023-04-10 DIAGNOSIS — I10 Essential (primary) hypertension: Secondary | ICD-10-CM

## 2023-04-10 DIAGNOSIS — G4733 Obstructive sleep apnea (adult) (pediatric): Secondary | ICD-10-CM

## 2023-04-10 DIAGNOSIS — E782 Mixed hyperlipidemia: Secondary | ICD-10-CM

## 2023-04-10 MED ORDER — ATENOLOL 50 MG PO TABS: 50.0000 mg | ORAL_TABLET | Freq: Every day | ORAL | 3 refills | Status: DC

## 2023-04-10 NOTE — Addendum Note (Signed)
Addended by: Virl Axe, Tashawna Thom L on: 04/10/2023 12:25 PM   Modules accepted: Orders

## 2023-04-10 NOTE — Patient Instructions (Signed)
Medication Instructions:  Your physician has recommended you make the following change in your medication:  1-Decrease Atenolol to 50 mg by mouth daily.  *If you need a refill on your cardiac medications before your next appointment, please call your pharmacy*  Lab Work: Your physician recommends that you have lab work at any [facility] for fasting lipid and liver panel.  If you have labs (blood work) drawn today and your tests are completely normal, you will receive your results only by: MyChart Message (if you have MyChart) OR A paper copy in the mail If you have any lab test that is abnormal or we need to change your treatment, we will call you to review the results.  Follow-Up: At [facility], you and your health needs are our priority.  As part of our continuing mission to provide you with exceptional heart care, we have created designated Provider Care Teams.  These Care Teams include your primary Cardiologist (physician) and Advanced Practice Providers (APPs -  Physician Assistants and Nurse Practitioners) who all work together to provide you with the care you need, when you need it.  We recommend signing up for the patient portal called "MyChart".  Sign up information is provided on this After Visit Summary.  MyChart is used to connect with patients for Virtual Visits (Telemedicine).  Patients are able to view lab/test results, encounter notes, upcoming appointments, etc.  Non-urgent messages can be sent to your provider as well.   To learn more about what you can do with MyChart, go to [email or web address].    Your next appointment:   6 month(s)  Provider:   Christell Constant, MD     Other Instructions   1st Floor: - Lobby - Registration  - Pharmacy  - Lab - Cafe  2nd Floor: - PV Lab - Diagnostic Testing (echo, CT, nuclear med)  3rd Floor: - Vacant  4th Floor: - TCTS (cardiothoracic surgery) - AFib Clinic - Structural Heart Clinic - Vascular  Surgery  - Vascular Ultrasound  5th Floor: - HeartCare Cardiology (general and EP) - Clinical Pharmacy for coumadin, hypertension, lipid, weight-loss medications, and med management appointments    Valet parking services will be available as well.

## 2023-04-13 IMAGING — DX DG CHEST 2V
2 series · 2 of 2 positions shown · non-contrast
Comparison: 02/18/2019

CLINICAL DATA: Recurring left chest pain. NKI. Hx of HTN.

EXAM:
CHEST - 2 VIEW

[chest pa]
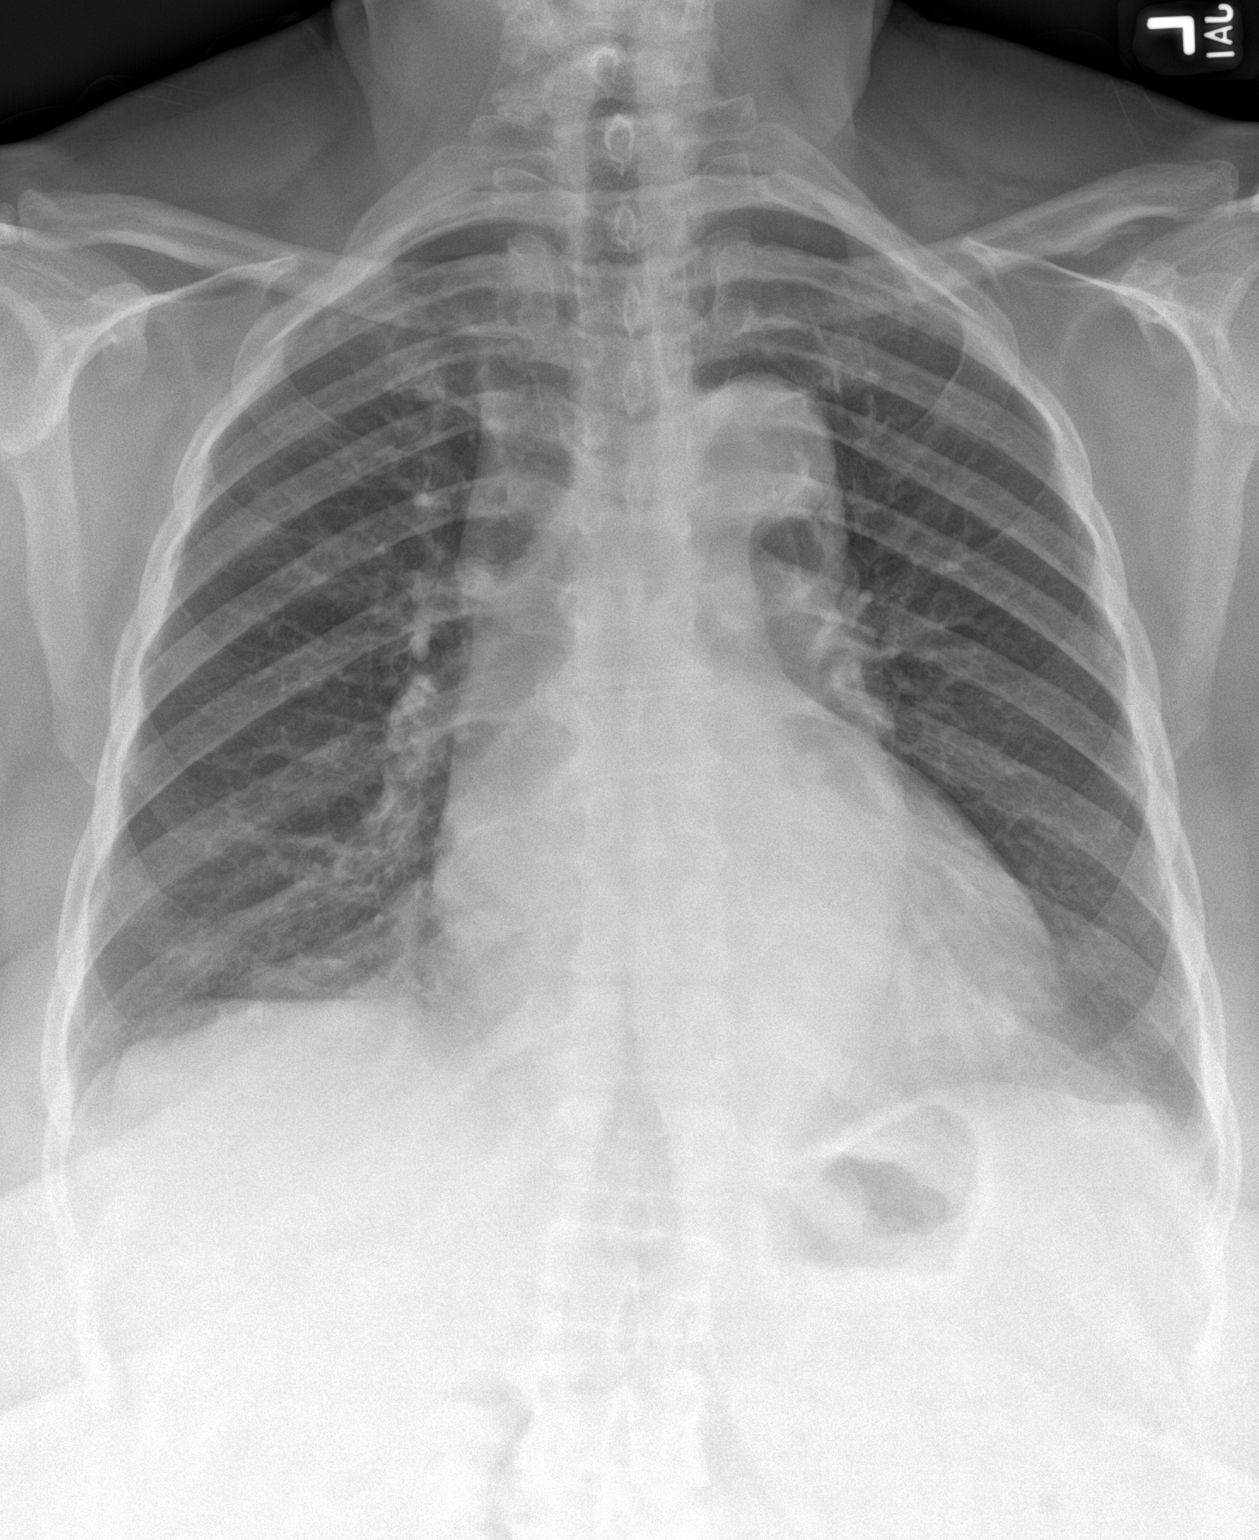

[chest lat]
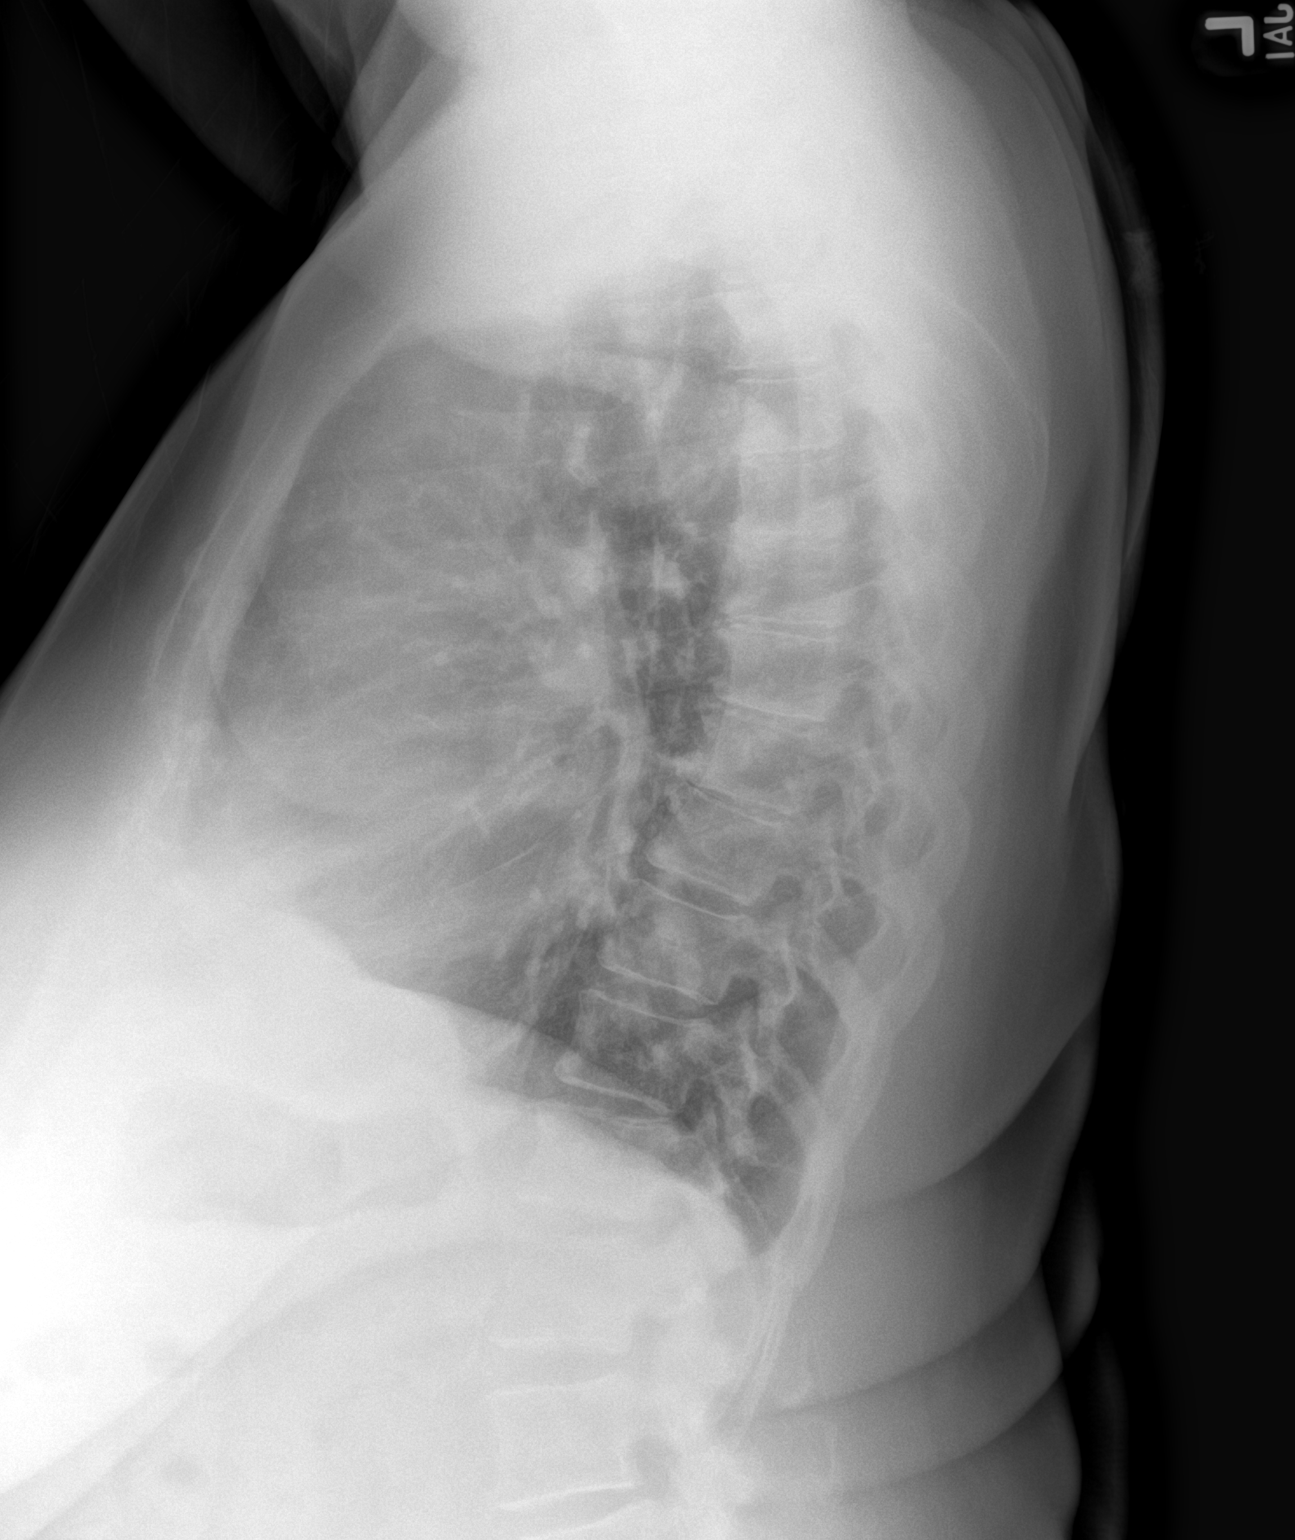

[2 of 2 positions shown; findings below may reference images not displayed]

FINDINGS: Minimal subsegmental atelectasis or scarring in the lung bases
stable. No new infiltrate or overt edema.

Heart size upper limits normal. Aortic Atherosclerosis
([id number]-170.0).

No effusion.  No pneumothorax.

Visualized bones unremarkable.
IMPRESSION: No acute cardiopulmonary disease.

## 2023-04-16 ENCOUNTER — Ambulatory Visit (HOSPITAL_COMMUNITY)
Admission: EM | Admit: 2023-04-16 | Discharge: 2023-04-16 | Disposition: A | Discharge status: Home / Self Care | Payer: Medicare Other | Attending: Emergency Medicine | Admitting: Emergency Medicine

## 2023-04-16 ENCOUNTER — Ambulatory Visit (INDEPENDENT_AMBULATORY_CARE_PROVIDER_SITE_OTHER): Payer: Medicare Other

## 2023-04-16 ENCOUNTER — Encounter (HOSPITAL_COMMUNITY): Payer: Self-pay | Admitting: *Deleted

## 2023-04-16 DIAGNOSIS — R053 Chronic cough: Secondary | ICD-10-CM

## 2023-04-16 MED ORDER — BENZONATATE 100 MG PO CAPS: 100.0000 mg | ORAL_CAPSULE | Freq: Three times a day (TID) | ORAL | 0 refills | Status: DC | PRN

## 2023-04-16 MED ORDER — NYSTATIN 100000 UNIT/GM EX CREA: TOPICAL_CREAM | CUTANEOUS | 0 refills | Status: DC

## 2023-04-16 NOTE — Discharge Instructions (Addendum)
The tessalon cough pills can be taken 3x daily. If this medication makes you drowsy, take only one pill before bed.  Continue lozenges and drinking fluids!  Nystatin cream can be used twice daily for the groin rash. Use for about a week or two. If rash persists, please follow up with your primary care provider

## 2023-04-16 NOTE — ED Provider Notes (Signed)
[facility]    CSN: [id number] Arrival date & time: 04/16/23  1044     History   Chief Complaint Chief Complaint  Patient presents with   Cough    HPI April Donaldson is a 89 y.o. female.  Here with concerns for cough for a few days  Originally seen by PCP 1/28, was put on augmentin. Had xray with bronchitic changes. Finished course of antibiotic and saw PCP last week where symptoms were reported improved. Was advised mucinex. She was fine until a few days ago started coughing again. Reports not as bad as originally. Not having shortness of breath or wheezing No fevers  Hx HTN. Followed by cardiology   Past Medical History:  Diagnosis Date   Anxiety    Benign neoplasm of kidney, except pelvis    Cellulitis of leg 03/09/2013 hosp    Cerebrovascular disease    Chronic low back pain    Diabetes mellitus without complication (HCC) 10/25/2021   Diastolic CHF (HCC)    Dr Verdis Prime   Difficult intubation    per patient with hand surgery in 2016 at surgical center- lip was pinched and swollen after surgery- patient states dr Mina Marble never stated a problem with intubation    Diverticulosis    DJD (degenerative joint disease)    Dyspnea    with exertion    GERD (gastroesophageal reflux disease)    no meds   Glaucoma    Gout    H/O hiatal hernia    Hepatitis    hx of years ago    Hx of colonic polyps    Hypercholesteremia    Hypertension    Hypothyroid    IBS (irritable bowel syndrome)    Internal hemorrhoids    Obesity    OSA (obstructive sleep apnea)    NPSG 2009: AHI 20/h. CPAP - follows with [facility]   Renal insufficiency    one kidney small and non funtioning   Tubular adenoma 2000   Venous insufficiency     Patient Active Problem List   Diagnosis Date Noted   Chest congestion 04/09/2023   Chronic diastolic heart failure (HCC) 11/15/2022   Allergic reaction 11/09/2021   Acquired deformity of lower leg 10/25/2021   Anemia 10/25/2021   Bilateral  shoulder pain 08/12/2021   Cervical pain (neck) 07/28/2021   Bilateral carotid artery stenosis 03/25/2021   Grade II diastolic dysfunction 02/13/2021   Newly recognized heart murmur 01/02/2020   Numbness and tingling in left hand 06/26/2019   Candidal dermatitis 07/21/2016   Bilateral pseudophakia 04/10/2016   Routine general medical examination at a health care facility 06/15/2014   Primary open angle glaucoma of both eyes, mild stage 07/10/2011   Obstructive sleep apnea 11/11/2007   GLAUCOMA 09/22/2007   CKD (chronic kidney disease) stage 3, GFR 30-59 ml/min (HCC) 03/21/2007   Hypothyroidism 01/04/2007   Hyperlipidemia 01/04/2007   GOUT 01/04/2007   Essential hypertension 01/04/2007    Past Surgical History:  Procedure Laterality Date   ABDOMINAL HYSTERECTOMY     APPENDECTOMY     CATARACT EXTRACTION, BILATERAL Bilateral 2017   cataracts Bilateral    COLONOSCOPY     CYSTOSCOPY/RETROGRADE/URETEROSCOPY Bilateral 04/09/2017   Procedure: CYSTOSCOPY/RETROGRADE;  Surgeon: Heloise Purpura, MD;  Location: [facility];  Service: Urology;  Laterality: Bilateral;  ONLY NEEDS 30 MIN FOR PROCEDURE   DILATION AND CURETTAGE OF UTERUS     FUNCTIONAL ENDOSCOPIC SINUS SURGERY  05/2003   Dr. Haroldine Laws   hysterctomy and ooperectomy  1970's   MASS EXCISION  2003   lt   MASS EXCISION Left 09/17/2013   Procedure: EXCISION MASS LEFT HAND;  Surgeon: Marlowe Shores, MD;  Location: [facility];  Service: Orthopedics;  Laterality: Left;   thyroid lobectomy for a cyst  10/2000   Dr. Haroldine Laws   TONSILLECTOMY      OB History   No obstetric history on file.      Home Medications    Prior to Admission medications   Medication Sig Start Date End Date Taking? Authorizing Provider  allopurinol (ZYLOPRIM) 100 MG tablet TAKE 1 TABLET BY MOUTH EVERY DAY 08/17/22  Yes Myrlene Broker, MD  aspirin 81 MG tablet Take 81 mg by mouth daily.   Yes [provider]  atenolol (TENORMIN) 50  MG tablet Take 1 tablet (50 mg total) by mouth daily. 04/10/23  Yes Gaston Islam., NP  benzonatate (TESSALON) 100 MG capsule Take 1 capsule (100 mg total) by mouth 3 (three) times daily as needed for cough. 04/16/23  Yes Nabor Thomann, Lurena Joiner, PA-C  dorzolamide-timolol (COSOPT) 22.3-6.8 MG/ML ophthalmic solution Place 1 drop into both eyes 2 (two) times daily.   Yes [provider]  latanoprost (XALATAN) 0.005 % ophthalmic solution Place 1 drop into both eyes at bedtime.   Yes [provider]  levothyroxine (SYNTHROID) 50 MCG tablet Take 1 tablet (50 mcg total) by mouth daily before breakfast. 02/16/23  Yes Myrlene Broker, MD  lisinopril (ZESTRIL) 10 MG tablet Take 1 tablet (10 mg total) by mouth in the morning. 11/15/22  Yes Chandrasekhar, Mahesh A, MD  lisinopril (ZESTRIL) 5 MG tablet Take 1 tablet (5 mg total) by mouth at bedtime. 11/15/22 04/16/23 Yes Chandrasekhar, Mahesh A, MD  nystatin cream (MYCOSTATIN) Apply to affected area 2 times daily 04/16/23  Yes Bryley Chrisman, Lurena Joiner, PA-C  simvastatin (ZOCOR) 20 MG tablet TAKE 1 TABLET DAILY AT 6PM 03/13/23  Yes Myrlene Broker, MD  triamterene-hydrochlorothiazide (DYAZIDE) 37.5-25 MG capsule TAKE 1 CAPSULE BY MOUTH EVERY DAY 02/16/23  Yes Myrlene Broker, MD  Wheat Dextrin (BENEFIBER DRINK MIX PO) Take by mouth daily.    [provider]    Family History Family History  Problem Relation Age of Onset   Heart disease Mother    Diabetes Mother    Congestive Heart Failure Mother    Asthma Brother    Throat cancer Brother 37   Asthma Sister    Brain cancer Sister 72   Breast cancer Sister 57   Colon cancer Neg Hx    Esophageal cancer Neg Hx    Stomach cancer Neg Hx    Pancreatic cancer Neg Hx     Social History Social History   Tobacco Use   Smoking status: Never   Smokeless tobacco: Never  Vaping Use   Vaping status: Never Used  Substance Use Topics   Alcohol use: No    Alcohol/week: 0.0 standard  drinks of alcohol   Drug use: No     Allergies   Amlodipine besylate, Clonidine, Diltiazem hcl, Klonopin [clonazepam], Tetracycline, Doxycycline hyclate, Hydralazine, Iodine, Labetalol hcl, Lidocaine, and Olmesartan medoxomil   Review of Systems Review of Systems Per HPI  Physical Exam Triage Vital Signs ED Triage Vitals  Encounter Vitals Group     BP 04/16/23 1321 (!) 190/78     Systolic BP Percentile --      Diastolic BP Percentile --      Pulse Rate 04/16/23 1321 (!) 51  Resp 04/16/23 1321 20     Temp 04/16/23 1321 (!) 97.4 F (36.3 C)     Temp src --      SpO2 04/16/23 1321 95 %     Weight --      Height --      Head Circumference --      Peak Flow --      Pain Score 04/16/23 1318 0     Pain Loc --      Pain Education --      Exclude from Growth Chart --    No data found.  Updated Vital Signs BP (!) 186/89 (BP Location: Right Arm)   Pulse (!) 52   Temp (!) 97.4 F (36.3 C)   Resp 20   SpO2 97%   Physical Exam Vitals and nursing note reviewed.  Constitutional:      General: She is not in acute distress.    Appearance: She is not ill-appearing.  HENT:     Right Ear: Tympanic membrane and ear canal normal.     Left Ear: Tympanic membrane and ear canal normal.     Nose: No congestion or rhinorrhea.     Mouth/Throat:     Mouth: Mucous membranes are moist.     Pharynx: Oropharynx is clear. No posterior oropharyngeal erythema.  Eyes:     Conjunctiva/sclera: Conjunctivae normal.  Cardiovascular:     Rate and Rhythm: Normal rate and regular rhythm.     Pulses: Normal pulses.     Heart sounds: Normal heart sounds.  Pulmonary:     Effort: Pulmonary effort is normal. No respiratory distress.     Breath sounds: Normal breath sounds. No wheezing or rales.     Comments: Clear sounds throughout. No acute distress Musculoskeletal:     Cervical back: Normal range of motion.  Lymphadenopathy:     Cervical: No cervical adenopathy.  Skin:    General: Skin is  warm and dry.  Neurological:     Mental Status: She is alert and oriented to person, place, and time.     UC Treatments / Results  Labs (all labs ordered are listed, but only abnormal results are displayed) Labs Reviewed - No data to display  EKG  Radiology DG Chest 2 View Result Date: 04/16/2023 CLINICAL DATA:  Cough for 3 weeks EXAM: CHEST - 2 VIEW COMPARISON:  03/27/2023 FINDINGS: The heart size and mediastinal contours are within normal limits. Both lungs are clear. The visualized skeletal structures are unremarkable. Aortic atherosclerosis. IMPRESSION: No active cardiopulmonary disease. Electronically Signed   By: Jasmine Pang M.D.   On: 04/16/2023 16:18    Procedures Procedures   Medications Ordered in UC Medications - No data to display  Initial Impression / Assessment and Plan / UC Course  I have reviewed the triage vital signs and the nursing notes.  Pertinent labs & imaging results that were available during my care of the patient were reviewed by me and considered in my medical decision making (see chart for details).  Afebrile, well appearing, sating 97% room air HR is low 50s which is baseline for her Hypertensive 186/89. No red flags. Saw cardiology 6 days ago and pressure was 138/80. Advised continue prescribed medications and monitor.  For cough, xray is repeated today which does not show new abnormality.  Could either have postviral cough syndrome from original bronchitic changes, or has picked up new virus. Try tessalon TID. Advised contact PCP for follow up. Strict ED precautions for  any worsening.   At end of visit patient reports itchy rash in the bilateral groin area for a week. Not having vaginal itching. Has not tried anything yet because she did not know what would work. Will try nystatin cream BID x 1-2 weeks Follow up with PCP   Final Clinical Impressions(s) / UC Diagnoses   Final diagnoses:  Persistent cough for 3 weeks or longer      Discharge Instructions      The tessalon cough pills can be taken 3x daily. If this medication makes you drowsy, take only one pill before bed.  Continue lozenges and drinking fluids!  Nystatin cream can be used twice daily for the groin rash. Use for about a week or two. If rash persists, please follow up with your primary care provider     ED Prescriptions     Medication Sig Dispense Auth. Provider   benzonatate (TESSALON) 100 MG capsule Take 1 capsule (100 mg total) by mouth 3 (three) times daily as needed for cough. 30 capsule Zaron Zwiefelhofer, PA-C   nystatin cream (MYCOSTATIN) Apply to affected area 2 times daily 30 g Dejanique Ruehl, Lurena Joiner, PA-C      PDMP not reviewed this encounter.   Marlow Baars, [place] 04/16/23 1635

## 2023-04-16 NOTE — ED Triage Notes (Signed)
PT continues to have a cough  since January. Pt wants to be checked to make sure she is OK.

## 2023-04-17 ENCOUNTER — Ambulatory Visit (INDEPENDENT_AMBULATORY_CARE_PROVIDER_SITE_OTHER): Payer: Medicare Other

## 2023-04-17 DIAGNOSIS — Z0000 Encounter for general adult medical examination without abnormal findings: Secondary | ICD-10-CM

## 2023-04-17 NOTE — Patient Instructions (Signed)
April Donaldson , Thank you for taking time to come for your Medicare Wellness Visit. I appreciate your ongoing commitment to your health goals. Please review the following plan we discussed and let me know if I can assist you in the future.   Referrals/Orders/Follow-Ups/Clinician Recommendations: none  This is a list of the screening recommended for you and due dates:  Health Maintenance  Topic Date Due   DTaP/Tdap/Td vaccine (3 - Td or Tdap) 03/21/2022   COVID-19 Vaccine (5 - 2024-25 season) 10/29/2022   Medicare Annual Wellness Visit  04/16/2024   Pneumonia Vaccine  Completed   Flu Shot  Completed   DEXA scan (bone density measurement)  Completed   HPV Vaccine  Aged Out   Zoster (Shingles) Vaccine  Discontinued    Advanced directives: (ACP Link)Information on Advanced Care Planning can be found at [facility] Advance Health Care Directives Advance Health Care Directives ([email or web address]   Next Medicare Annual Wellness Visit scheduled for next year: Yes  insert Preventive Care attachment Insert FALL PREVENTION attachment if needed

## 2023-04-17 NOTE — Progress Notes (Signed)
Subjective:   April Donaldson is a 89 y.o. female who presents for Medicare Annual (Subsequent) preventive examination.  Visit Complete: Virtual I connected with  Hervey Ard on 04/17/23 by a audio enabled telemedicine application and verified that I am speaking with the correct person using two identifiers. Interactive audio and video telecommunications were attempted between this provider and patient, however failed, due to patient having technical difficulties OR patient did not have access to video capability.  We continued and completed visit with audio only.  Patient Location: Home  Provider Location: Office/Clinic  I discussed the limitations of evaluation and management by telemedicine. The patient expressed understanding and agreed to proceed.  Vital Signs: Because this visit was a virtual/telehealth visit, some criteria may be missing or patient reported. Any vitals not documented were not able to be obtained and vitals that have been documented are patient reported.   Cardiac Risk Factors include: advanced age (>14men, >8 women);dyslipidemia;hypertension     Objective:    Today's Vitals   There is no height or weight on file to calculate BMI.     04/17/2023    2:55 PM 01/24/2022   10:12 AM 12/08/2021    3:02 PM 06/05/2021   11:39 PM 01/17/2021   12:46 PM 11/14/2019   10:35 AM 03/05/2018   11:41 AM  Advanced Directives  Does Patient Have a Medical Advance Directive? No Yes Yes Yes Yes Yes Yes  Type of Furniture conservator/restorer;Living will  Healthcare Power of [place];Living will Living will;Healthcare Power of [employer] Power of [place];Living will  Does patient want to make changes to medical advance directive?   Yes (MAU/Ambulatory/Procedural Areas - Information given)  No - Patient declined No - Patient declined   Copy of Healthcare Power of Attorney in Chart?  No - copy requested   No - copy requested  No - copy requested     Current Medications (verified) Outpatient Encounter Medications as of 04/17/2023  Medication Sig   allopurinol (ZYLOPRIM) 100 MG tablet TAKE 1 TABLET BY MOUTH EVERY DAY   aspirin 81 MG tablet Take 81 mg by mouth daily.   atenolol (TENORMIN) 50 MG tablet Take 1 tablet (50 mg total) by mouth daily.   benzonatate (TESSALON) 100 MG capsule Take 1 capsule (100 mg total) by mouth 3 (three) times daily as needed for cough.   dorzolamide-timolol (COSOPT) 22.3-6.8 MG/ML ophthalmic solution Place 1 drop into both eyes 2 (two) times daily.   latanoprost (XALATAN) 0.005 % ophthalmic solution Place 1 drop into both eyes at bedtime.   levothyroxine (SYNTHROID) 50 MCG tablet Take 1 tablet (50 mcg total) by mouth daily before breakfast.   lisinopril (ZESTRIL) 10 MG tablet Take 1 tablet (10 mg total) by mouth in the morning.   nystatin cream (MYCOSTATIN) Apply to affected area 2 times daily   simvastatin (ZOCOR) 20 MG tablet TAKE 1 TABLET DAILY AT 6PM   triamterene-hydrochlorothiazide (DYAZIDE) 37.5-25 MG capsule TAKE 1 CAPSULE BY MOUTH EVERY DAY   Wheat Dextrin (BENEFIBER DRINK MIX PO) Take by mouth daily.   lisinopril (ZESTRIL) 5 MG tablet Take 1 tablet (5 mg total) by mouth at bedtime.   No facility-administered encounter medications on file as of 04/17/2023.    Allergies (verified) Amlodipine besylate, Clonidine, Diltiazem hcl, Klonopin [clonazepam], Tetracycline, Doxycycline hyclate, Hydralazine, Iodine, Labetalol hcl, Lidocaine, and Olmesartan medoxomil   History: Past Medical History:  Diagnosis Date   Anxiety    Benign neoplasm of  kidney, except pelvis    Cellulitis of leg 03/09/2013 hosp    Cerebrovascular disease    Chronic low back pain    Diabetes mellitus without complication (HCC) 10/25/2021   Diastolic CHF (HCC)    Dr Verdis Prime   Difficult intubation    per patient with hand surgery in 2016 at surgical center- lip was pinched and swollen after surgery- patient states dr Mina Marble  never stated a problem with intubation    Diverticulosis    DJD (degenerative joint disease)    Dyspnea    with exertion    GERD (gastroesophageal reflux disease)    no meds   Glaucoma    Gout    H/O hiatal hernia    Hepatitis    hx of years ago    Hx of colonic polyps    Hypercholesteremia    Hypertension    Hypothyroid    IBS (irritable bowel syndrome)    Internal hemorrhoids    Obesity    OSA (obstructive sleep apnea)    NPSG 2009: AHI 20/h. CPAP - follows with Clance   Renal insufficiency    one kidney small and non funtioning   Tubular adenoma 2000   Venous insufficiency    Past Surgical History:  Procedure Laterality Date   ABDOMINAL HYSTERECTOMY     APPENDECTOMY     CATARACT EXTRACTION, BILATERAL Bilateral 2017   cataracts Bilateral    COLONOSCOPY     CYSTOSCOPY/RETROGRADE/URETEROSCOPY Bilateral 04/09/2017   Procedure: CYSTOSCOPY/RETROGRADE;  Surgeon: Heloise Purpura, MD;  Location: [facility];  Service: Urology;  Laterality: Bilateral;  ONLY NEEDS 30 MIN FOR PROCEDURE   DILATION AND CURETTAGE OF UTERUS     FUNCTIONAL ENDOSCOPIC SINUS SURGERY  05/2003   Dr. Haroldine Laws   hysterctomy and ooperectomy  1970's   MASS EXCISION  2003   lt   MASS EXCISION Left 09/17/2013   Procedure: EXCISION MASS LEFT HAND;  Surgeon: Marlowe Shores, MD;  Location: [facility];  Service: Orthopedics;  Laterality: Left;   thyroid lobectomy for a cyst  10/2000   Dr. Haroldine Laws   TONSILLECTOMY     Family History  Problem Relation Age of Onset   Heart disease Mother    Diabetes Mother    Congestive Heart Failure Mother    Asthma Brother    Throat cancer Brother 91   Asthma Sister    Brain cancer Sister 62   Breast cancer Sister 1   Colon cancer Neg Hx    Esophageal cancer Neg Hx    Stomach cancer Neg Hx    Pancreatic cancer Neg Hx    Social History   Socioeconomic History   Marital status: Single    Spouse name: Not on file   Number of children: 1   Years of  education: Not on file   Highest education level: Not on file  Occupational History   Occupation: retired from worked in Doctor, hospital: RETIRED  Tobacco Use   Smoking status: Never   Smokeless tobacco: Never  Vaping Use   Vaping status: Never Used  Substance and Sexual Activity   Alcohol use: No    Alcohol/week: 0.0 standard drinks of alcohol   Drug use: No   Sexual activity: Not Currently  Other Topics Concern   Not on file  Social History Narrative   Lives alone, indep - never married   g-dtr in [place], siblings, nieces in town   Keeps in touch with a few  friends who talk regularly   Social Drivers of Health   Financial Resource Strain: Low Risk  (04/17/2023)   Overall Financial Resource Strain (CARDIA)    Difficulty of Paying Living Expenses: Not hard at all  Food Insecurity: No Food Insecurity (04/17/2023)   Hunger Vital Sign    Worried About Running Out of Food in the Last Year: Never true    Ran Out of Food in the Last Year: Never true  Transportation Needs: No Transportation Needs (04/17/2023)   PRAPARE - Administrator, Civil Service (Medical): No    Lack of Transportation (Non-Medical): No  Physical Activity: Inactive (04/17/2023)   Exercise Vital Sign    Days of Exercise per Week: 0 days    Minutes of Exercise per Session: 0 min  Stress: No Stress Concern Present (04/17/2023)   [employer] of Occupational Health - Occupational Stress Questionnaire    Feeling of Stress : Not at all  Social Connections: Moderately Isolated (04/17/2023)   Social Connection and Isolation Panel [NHANES]    Frequency of Communication with Friends and Family: More than three times a week    Frequency of Social Gatherings with Friends and Family: Never    Attends Religious Services: More than 4 times per year    Active Member of [employer] or Organizations: No    Attends Banker Meetings: Never    Marital Status: Widowed    Tobacco  Counseling Counseling given: Not Answered   Clinical Intake:  Pre-visit preparation completed: Yes  Pain : No/denies pain     Nutritional Risks: None Diabetes: No  How often do you need to have someone help you when you read instructions, pamphlets, or other written materials from your doctor or pharmacy?: 1 - Never  Interpreter Needed?: No  Information entered by :: NAllen LPN   Activities of Daily Living    04/17/2023    2:47 PM  In your present state of health, do you have any difficulty performing the following activities:  Hearing? 0  Vision? 0  Difficulty concentrating or making decisions? 0  Walking or climbing stairs? 0  Dressing or bathing? 0  Doing errands, shopping? 0  Preparing Food and eating ? N  Using the Toilet? N  In the past six months, have you accidently leaked urine? Y  Comment with a laugh or sneeze  Do you have problems with loss of bowel control? N  Managing your Medications? N  Managing your Finances? N  Housekeeping or managing your Housekeeping? N    Patient Care Team: Myrlene Broker, MD as PCP - General (Internal Medicine) Christell Constant, MD as PCP - Cardiology (Cardiology) Clance, Maree Krabbe, MD (Sleep Medicine) Wendall Stade, MD (Cardiology) Iva Boop, MD (Gastroenterology) Elvis Coil, MD (Nephrology) Heloise Purpura, MD (Urology) Keturah Barre, MD (Otolaryngology) Jethro Bolus, MD (Ophthalmology) Lynden Ang, NP (Obstetrics and Gynecology) Lottie Dawson, Doran Stabler, MD (Ophthalmology) Dairl Ponder, MD (Orthopedic Surgery) Mamie Nick, DC as Referring Physician (Chiropractic Medicine)  Indicate any recent Medical Services you may have received from other than [facility] providers in the past year (date may be approximate).     Assessment:   This is a routine wellness examination for [facility].  Hearing/Vision screen Hearing Screening - Comments:: Denies hearing issues Vision Screening - Comments::  Regular eye exams, Dr. Lottie Dawson, [facility]   Goals Addressed             This Visit's Progress  Patient Stated       04/17/2023, getting over sickness       Depression Screen    04/17/2023    2:58 PM 03/27/2023    3:45 PM 10/25/2022   11:00 AM 08/30/2022   11:10 AM 07/25/2022   10:59 AM 01/24/2022   10:13 AM 11/09/2021    1:37 PM  PHQ 2/9 Scores  PHQ - 2 Score 0 0 0 0 0 0 1  PHQ- 9 Score     0  1    Fall Risk    04/17/2023    2:57 PM 04/09/2023    8:51 AM 04/09/2023    8:47 AM 03/27/2023    3:45 PM 03/12/2023   10:47 AM  Fall Risk   Falls in the past year? 0 0 0 0 0  Number falls in past yr: 0 0 0 0   Injury with Fall? 0 0 0 0   Risk for fall due to : Medication side effect No Fall Risks No Fall Risks No Fall Risks   Follow up Falls prevention discussed;Falls evaluation completed Falls evaluation completed Falls evaluation completed Falls evaluation completed     MEDICARE RISK AT HOME: Medicare Risk at Home Any stairs in or around the home?: Yes If so, are there any without handrails?: No Home free of loose throw rugs in walkways, pet beds, electrical cords, etc?: Yes Adequate lighting in your home to reduce risk of falls?: Yes Life alert?: No Use of a cane, walker or w/c?: No Grab bars in the bathroom?: No Shower chair or bench in shower?: No Elevated toilet seat or a handicapped toilet?: No  TIMED UP AND GO:  Was the test performed?  No    Cognitive Function:    02/15/2017    1:47 PM 05/07/2015    9:58 AM  MMSE - Mini Mental State Exam  Not completed:  --  Orientation to time 5   Orientation to Place 5   Registration 3   Attention/ Calculation 5   Recall 2   Language- name 2 objects 2   Language- repeat 1   Language- follow 3 step command 3   Language- read & follow direction 1   Write a sentence 1   Copy design 1   Total score 29         04/17/2023    2:59 PM 01/24/2022   10:16 AM  6CIT Screen  What Year? 0 points 0 points  What month? 0  points 0 points  What time? 0 points 0 points  Count back from 20 0 points 0 points  Months in reverse 0 points 0 points  Repeat phrase 0 points 0 points  Total Score 0 points 0 points    Immunizations Immunization History  Administered Date(s) Administered   Fluad Quad(high Dose 65+) 11/09/2018, 12/02/2019, 11/10/2020, 11/14/2021   Fluad Trivalent(High Dose 65+) 10/25/2022   Influenza Split 11/30/2010, 11/20/2011   Influenza Whole 12/12/2006, 11/27/2007, 11/25/2008, 11/26/2009   Influenza, High Dose Seasonal PF 11/23/2015, 11/20/2017   Influenza,inj,Quad PF,6+ Mos 11/26/2012, 11/06/2013, 11/19/2014   PFIZER Comirnaty(Gray Top)Covid-19 Tri-Sucrose Vaccine 01/18/2022   PFIZER(Purple Top)SARS-COV-2 Vaccination 03/24/2019, 04/14/2019, 01/27/2020   Pneumococcal Conjugate-13 12/15/2014   Pneumococcal Polysaccharide-23 03/16/2009   Td 02/27/2001   Tdap 03/21/2012    TDAP status: Due, Education has been provided regarding the importance of this vaccine. Advised may receive this vaccine at local pharmacy or Health Dept. Aware to provide a copy of the vaccination record if obtained from  local pharmacy or Health Dept. Verbalized acceptance and understanding.  Flu Vaccine status: Up to date  Pneumococcal vaccine status: Up to date  Covid-19 vaccine status: Information provided on how to obtain vaccines.   Qualifies for Shingles Vaccine? Yes   Zostavax completed No   Shingrix Completed?: No.    Education has been provided regarding the importance of this vaccine. Patient has been advised to call insurance company to determine out of pocket expense if they have not yet received this vaccine. Advised may also receive vaccine at local pharmacy or Health Dept. Verbalized acceptance and understanding.  Screening Tests Health Maintenance  Topic Date Due   DTaP/Tdap/Td (3 - Td or Tdap) 03/21/2022   COVID-19 Vaccine (5 - 2024-25 season) 10/29/2022   Medicare Annual Wellness (AWV)  04/16/2024    Pneumonia Vaccine 84+ Years old  Completed   INFLUENZA VACCINE  Completed   DEXA SCAN  Completed   HPV VACCINES  Aged Out   Zoster Vaccines- Shingrix  Discontinued    Health Maintenance  Health Maintenance Due  Topic Date Due   DTaP/Tdap/Td (3 - Td or Tdap) 03/21/2022   COVID-19 Vaccine (5 - 2024-25 season) 10/29/2022    Colorectal cancer screening: No longer required.   Mammogram status: No longer required due to age.  Bone Density status: Completed 07/25/2017.   Lung Cancer Screening: (Low Dose CT Chest recommended if Age 44-80 years, 20 pack-year currently smoking OR have quit w/in 15years.) does not qualify.   Lung Cancer Screening Referral: no  Additional Screening:  Hepatitis C Screening: does not qualify;   Vision Screening: Recommended annual ophthalmology exams for early detection of glaucoma and other disorders of the eye. Is the patient up to date with their annual eye exam?  Yes  Who is the provider or what is the name of the office in which the patient attends annual eye exams? Dr. Lottie Dawson If pt is not established with a provider, would they like to be referred to a provider to establish care? No .   Dental Screening: Recommended annual dental exams for proper oral hygiene  Diabetic Foot Exam: n/a  Community Resource Referral / Chronic Care Management: CRR required this visit?  No   CCM required this visit?  No     Plan:     I have personally reviewed and noted the following in the patient's chart:   Medical and social history Use of alcohol, tobacco or illicit drugs  Current medications and supplements including opioid prescriptions. Patient is not currently taking opioid prescriptions. Functional ability and status Nutritional status Physical activity Advanced directives List of other physicians Hospitalizations, surgeries, and ER visits in previous 12 months Vitals Screenings to include cognitive, depression, and falls Referrals and  appointments  In addition, I have reviewed and discussed with patient certain preventive protocols, quality metrics, and best practice recommendations. A written personalized care plan for preventive services as well as general preventive health recommendations were provided to patient.     Barb Merino, LPN   [id number]   After Visit Summary: (Pick Up) Due to this being a telephonic visit, with patients personalized plan was offered to patient and patient has requested to Pick up at office.  Nurse Notes: none

## 2023-04-30 ENCOUNTER — Ambulatory Visit: Payer: Medicare Other | Admitting: Internal Medicine

## 2023-05-01 ENCOUNTER — Ambulatory Visit: Admitting: Internal Medicine

## 2023-05-01 ENCOUNTER — Encounter: Payer: Self-pay | Admitting: Internal Medicine

## 2023-05-01 VITALS — BP 122/88 | HR 63 | Temp 97.7°F | Ht <= 58 in | Wt 163.0 lb

## 2023-05-01 DIAGNOSIS — E039 Hypothyroidism, unspecified: Secondary | ICD-10-CM

## 2023-05-01 DIAGNOSIS — N1832 Chronic kidney disease, stage 3b: Secondary | ICD-10-CM

## 2023-05-01 DIAGNOSIS — M1A00X0 Idiopathic chronic gout, unspecified site, without tophus (tophi): Secondary | ICD-10-CM

## 2023-05-01 DIAGNOSIS — I10 Essential (primary) hypertension: Secondary | ICD-10-CM

## 2023-05-01 LAB — CBC
HCT: 39.1 % (ref 36.0–46.0)
Hemoglobin: 12.8 g/dL (ref 12.0–15.0)
MCHC: 32.7 g/dL (ref 30.0–36.0)
MCV: 91.6 fl (ref 78.0–100.0)
Platelets: 314 10*3/uL (ref 150.0–400.0)
RBC: 4.27 Mil/uL (ref 3.87–5.11)
RDW: 14.4 % (ref 11.5–15.5)
WBC: 8.6 10*3/uL (ref 4.0–10.5)

## 2023-05-01 LAB — HEPATIC FUNCTION PANEL
ALT: 12 IU/L (ref 0–32)
AST: 19 IU/L (ref 0–40)
Albumin: 4.4 g/dL (ref 3.7–4.7)
Alkaline Phosphatase: 79 IU/L (ref 44–121)
Bilirubin Total: 0.5 mg/dL (ref 0.0–1.2)
Bilirubin, Direct: 0.15 mg/dL (ref 0.00–0.40)
Total Protein: 7.3 g/dL (ref 6.0–8.5)

## 2023-05-01 LAB — COMPREHENSIVE METABOLIC PANEL
ALT: 10 U/L (ref 0–35)
AST: 14 U/L (ref 0–37)
Albumin: 4 g/dL (ref 3.5–5.2)
Alkaline Phosphatase: 60 U/L (ref 39–117)
BUN: 25 mg/dL — ABNORMAL HIGH (ref 6–23)
CO2: 28 meq/L (ref 19–32)
Calcium: 9 mg/dL (ref 8.4–10.5)
Chloride: 105 meq/L (ref 96–112)
Creatinine, Ser: 1.16 mg/dL (ref 0.40–1.20)
GFR: 41.86 mL/min — ABNORMAL LOW (ref >60.00)
Glucose, Bld: 83 mg/dL (ref 70–99)
Potassium: 3.6 meq/L (ref 3.5–5.1)
Sodium: 141 meq/L (ref 135–145)
Total Bilirubin: 0.4 mg/dL (ref 0.2–1.2)
Total Protein: 7.3 g/dL (ref 6.0–8.3)

## 2023-05-01 LAB — LIPID PANEL
Chol/HDL Ratio: 2.9 ratio (ref 0.0–4.4)
Cholesterol, Total: 188 mg/dL (ref 100–199)
HDL: 64 mg/dL (ref >39)
LDL Chol Calc (NIH): 103 mg/dL — ABNORMAL HIGH (ref 0–99)
Triglycerides: 122 mg/dL (ref 0–149)
VLDL Cholesterol Cal: 21 mg/dL (ref 5–40)

## 2023-05-01 LAB — URIC ACID: Uric Acid, Serum: 7.6 mg/dL — ABNORMAL HIGH (ref 2.4–7.0)

## 2023-05-01 LAB — TSH: TSH: 2.25 u[IU]/mL (ref 0.35–5.50)

## 2023-05-01 NOTE — Progress Notes (Unsigned)
   Subjective:   Patient ID: April Donaldson, female    DOB: 07-13-1931, 89 y.o.   MRN: [id number]  HPI The patient is an 89 YO female coming in for medical management and follow up see a/p for details  Review of Systems  Constitutional: Negative.   HENT: Negative.    Eyes: Negative.   Respiratory:  Negative for cough, chest tightness and shortness of breath.   Cardiovascular:  Negative for chest pain, palpitations and leg swelling.  Gastrointestinal:  Negative for abdominal distention, abdominal pain, constipation, diarrhea, nausea and vomiting.  Musculoskeletal: Negative.   Skin: Negative.   Neurological: Negative.   Psychiatric/Behavioral: Negative.      Objective:  Physical Exam Constitutional:      Appearance: She is well-developed.  HENT:     Head: Normocephalic and atraumatic.  Cardiovascular:     Rate and Rhythm: Normal rate and regular rhythm.  Pulmonary:     Effort: Pulmonary effort is normal. No respiratory distress.     Breath sounds: Normal breath sounds. No wheezing or rales.  Abdominal:     General: Bowel sounds are normal. There is no distension.     Palpations: Abdomen is soft.     Tenderness: There is no abdominal tenderness. There is no rebound.  Musculoskeletal:     Cervical back: Normal range of motion.  Skin:    General: Skin is warm and dry.  Neurological:     Mental Status: She is alert and oriented to person, place, and time.     Coordination: Coordination normal.     Vitals:   05/01/23 1002  BP: 122/88  Pulse: 63  Temp: 97.7 F (36.5 C)  TempSrc: Oral  SpO2: 91%  Weight: 163 lb (73.9 kg)  Height: 4\' 9"  (1.448 m)    Assessment & Plan:

## 2023-05-01 NOTE — Patient Instructions (Signed)
 We will check the labs today.

## 2023-05-02 ENCOUNTER — Other Ambulatory Visit: Payer: Self-pay

## 2023-05-02 DIAGNOSIS — Z79899 Other long term (current) drug therapy: Secondary | ICD-10-CM

## 2023-05-02 DIAGNOSIS — E782 Mixed hyperlipidemia: Secondary | ICD-10-CM

## 2023-05-02 MED ORDER — ROSUVASTATIN CALCIUM 10 MG PO TABS: 10.0000 mg | ORAL_TABLET | Freq: Every day | ORAL | 5 refills | Status: DC

## 2023-05-03 ENCOUNTER — Telehealth: Payer: Self-pay | Admitting: Internal Medicine

## 2023-05-03 NOTE — Assessment & Plan Note (Signed)
 Checking TSH and adjust as needed levothyroxine 50 mcg daily as needed.

## 2023-05-03 NOTE — Assessment & Plan Note (Signed)
 Checking CMP for stability. BP at goal. Adjust as needed.

## 2023-05-03 NOTE — Assessment & Plan Note (Signed)
BP at goal and checking CMP and adjust as needed.

## 2023-05-03 NOTE — Telephone Encounter (Signed)
 Pt would like to discuss further w/ Shamea.  States she read somewhere that Crestor is supposed to be/was going to be "pulled off the market" and this concerned her.  Aware I am unaware of this and that we still prescribe this medication daily.  Educated at to why this decision to switch was made.  States she only has one kidney and that she is concerned, although I did try and alleviate this concern some. She would like Shamea to call her to discuss further.   Aware will send her the note

## 2023-05-03 NOTE — Telephone Encounter (Signed)
 Pt requesting cb to get a copy of lab results and also has some questions regarding crestor that has been prescribed for her.

## 2023-05-03 NOTE — Assessment & Plan Note (Signed)
 Checking uric acid and adjust allopurinol as needed for goal uric acid <6.

## 2023-05-04 NOTE — Telephone Encounter (Signed)
 Called pt reports was always told and read that statins are not good for you.  Reports only has 1 kidney and does not want to damage it.  Pt expressed crestor was pulled off the market for awhile and is not so sure about starting this medication.  Has questions she would like answered prior to starting med.  Advised pt she was on simvastatin for many years.  Pt reports she had not been paying attention to what was taking.  Advised med is metabolized by the liver and provider will get f/u labs to check liver function.  Pt reports has other specific questions she would like to have answered.  Advised will send message to our pharmacist to f/u.  Advised may not receive a call today but someone will f/u.  Pt thanked me for calling.

## 2023-05-08 NOTE — Telephone Encounter (Signed)
 Addressed all concern related to statin and especially rosuvastatin. Will start taking rosuvastatin 10 mg daily.

## 2023-05-08 NOTE — Telephone Encounter (Signed)
 Called pt, NA LVM

## 2023-05-10 NOTE — Telephone Encounter (Signed)
 Pt called in asking to speak to pharmacist again about Crestor.

## 2023-05-11 MED ORDER — EZETIMIBE 10 MG PO TABS: 10.0000 mg | ORAL_TABLET | Freq: Every day | ORAL | 3 refills | Status: DC

## 2023-05-11 NOTE — Addendum Note (Signed)
 Addended by: Rosalee Kaufman on: 05/11/2023 12:18 PM   Modules accepted: Orders

## 2023-05-11 NOTE — Telephone Encounter (Signed)
 Spoke with patient.  She states that the pharmacist at [facility] told her not to take the rosuvastatin because she is allergic to iodine and rosuvastatin contains iodine.  It does not.  Patient still leery and not willing to try.  Will instead try ezetimibe 10 mg daily.

## 2023-05-12 ENCOUNTER — Other Ambulatory Visit: Payer: Self-pay | Admitting: Internal Medicine

## 2023-05-13 ENCOUNTER — Other Ambulatory Visit: Payer: Self-pay | Admitting: Internal Medicine

## 2023-05-16 ENCOUNTER — Telehealth: Payer: Self-pay | Admitting: Internal Medicine

## 2023-05-16 DIAGNOSIS — Z79899 Other long term (current) drug therapy: Secondary | ICD-10-CM

## 2023-05-16 DIAGNOSIS — E782 Mixed hyperlipidemia: Secondary | ICD-10-CM

## 2023-05-16 NOTE — Telephone Encounter (Signed)
 Pt says she started Zetia this past Monday and her left side in her groin and across to her back... she say it started when she started taking the Zetia... she denies nausea and it hurts worse with movement.. she has had normal bowel movements.. she says the pain has peaked at a 4-5 at it's worse.. she just does not want to keep taking the med if it is hurting her... she will hold for now.... I will talk to our Pharm D ands she will also talk with her PCP in case this is not med related.   Pt also declined any difficulties with urination.

## 2023-05-16 NOTE — Telephone Encounter (Signed)
 Pt c/o medication issue:  1. Name of Medication:   ezetimibe (ZETIA) 10 MG tablet   2. How are you currently taking this medication (dosage and times per day)?   As prescribed  3. Are you having a reaction (difficulty breathing--STAT)?   4. What is your medication issue?   Patient stated she has been taking this medication for 3 days now and she is having pain on her left side in her waist area and her back.  Patient noted if she stays still it does not hurt.

## 2023-05-17 ENCOUNTER — Ambulatory Visit: Payer: Self-pay

## 2023-05-17 NOTE — Telephone Encounter (Signed)
  Chief Complaint: left back pain/side pain Symptoms: pain Frequency: started over a week ago Pertinent Negatives: Patient denies fever, CP, SOB Disposition: [] ED /[] Urgent Care (no appt availability in office) / [x] Appointment(In office/virtual)/ []  [facility]/ [] Home Care/ [] Refused Recommended Disposition /[] [facility]/ []  Follow-up with PCP Additional Notes: patient called with concerns for left sided back and side pain. Patient states started a week ago. Pain is 5-6 out of 10. No fever, CP, or SOB. Per protocol, patient to be seen within three days. Appointment made for tomorrow with PCP at 12:20 PM. Patient verbalized understanding of plan and all questions answered.    Copied from CRM [id number]. Topic: Clinical - Red Word Triage >> May 17, 2023 12:41 PM Theodis Sato wrote: Red Word that prompted transfer to Nurse Triage: Pain left side of body Reason for Disposition  [1] MODERATE back pain (e.g., interferes with normal activities) AND [2] present > 3 days  Answer Assessment - Initial Assessment Questions 1. ONSET: "When did the pain begin?"      1 week 2. LOCATION: "Where does it hurt?" (upper, mid or lower back)     Lower back 3. SEVERITY: "How bad is the pain?"  (e.g., Scale 1-10; mild, moderate, or severe)   - MILD (1-3): Doesn't interfere with normal activities.    - MODERATE (4-7): Interferes with normal activities or awakens from sleep.    - SEVERE (8-10): Excruciating pain, unable to do any normal activities.      5-6 out of 10 4. PATTERN: "Is the pain constant?" (e.g., yes, no; constant, intermittent)      Comes and goes 5. RADIATION: "Does the pain shoot into your legs or somewhere else?"     Left back pain that goes to her side 6. CAUSE:  "What do you think is causing the back pain?"      unsure 7. BACK OVERUSE:  "Any recent lifting of heavy objects, strenuous work or exercise?"     No 8. MEDICINES: "What have you taken so far for the pain?"  (e.g., nothing, acetaminophen, NSAIDS)     Hasn't taken any medications 9. NEUROLOGIC SYMPTOMS: "Do you have any weakness, numbness, or problems with bowel/bladder control?"     No 10. OTHER SYMPTOMS: "Do you have any other symptoms?" (e.g., fever, abdomen pain, burning with urination, blood in urine)       Left sided abdominal pain  Protocols used: Back Pain-A-AH

## 2023-05-18 ENCOUNTER — Ambulatory Visit (INDEPENDENT_AMBULATORY_CARE_PROVIDER_SITE_OTHER): Admitting: Family Medicine

## 2023-05-18 ENCOUNTER — Encounter: Payer: Self-pay | Admitting: Family Medicine

## 2023-05-18 ENCOUNTER — Ambulatory Visit: Admitting: Internal Medicine

## 2023-05-18 VITALS — BP 140/80 | HR 56 | Temp 97.6°F | Ht <= 58 in | Wt 162.6 lb

## 2023-05-18 DIAGNOSIS — R109 Unspecified abdominal pain: Secondary | ICD-10-CM | POA: Diagnosis not present

## 2023-05-18 DIAGNOSIS — E782 Mixed hyperlipidemia: Secondary | ICD-10-CM

## 2023-05-18 LAB — CBC WITH DIFFERENTIAL/PLATELET
Basophils Absolute: 0.1 10*3/uL (ref 0.0–0.1)
Basophils Relative: 0.8 % (ref 0.0–3.0)
Eosinophils Absolute: 0.1 10*3/uL (ref 0.0–0.7)
Eosinophils Relative: 1.4 % (ref 0.0–5.0)
HCT: 39.3 % (ref 36.0–46.0)
Hemoglobin: 13 g/dL (ref 12.0–15.0)
Lymphocytes Relative: 38.7 % (ref 12.0–46.0)
Lymphs Abs: 3.6 10*3/uL (ref 0.7–4.0)
MCHC: 33.1 g/dL (ref 30.0–36.0)
MCV: 91.2 fl (ref 78.0–100.0)
Monocytes Absolute: 1 10*3/uL (ref 0.1–1.0)
Monocytes Relative: 11.2 % (ref 3.0–12.0)
Neutro Abs: 4.5 10*3/uL (ref 1.4–7.7)
Neutrophils Relative %: 47.9 % (ref 43.0–77.0)
Platelets: 332 10*3/uL (ref 150.0–400.0)
RBC: 4.31 Mil/uL (ref 3.87–5.11)
RDW: 14.4 % (ref 11.5–15.5)
WBC: 9.4 10*3/uL (ref 4.0–10.5)

## 2023-05-18 LAB — COMPREHENSIVE METABOLIC PANEL
ALT: 15 U/L (ref 0–35)
AST: 21 U/L (ref 0–37)
Albumin: 4.4 g/dL (ref 3.5–5.2)
Alkaline Phosphatase: 73 U/L (ref 39–117)
BUN: 26 mg/dL — ABNORMAL HIGH (ref 6–23)
CO2: 29 meq/L (ref 19–32)
Calcium: 9.5 mg/dL (ref 8.4–10.5)
Chloride: 102 meq/L (ref 96–112)
Creatinine, Ser: 1.14 mg/dL (ref 0.40–1.20)
GFR: 42.73 mL/min — ABNORMAL LOW (ref >60.00)
Glucose, Bld: 89 mg/dL (ref 70–99)
Potassium: 4.4 meq/L (ref 3.5–5.1)
Sodium: 139 meq/L (ref 135–145)
Total Bilirubin: 0.4 mg/dL (ref 0.2–1.2)
Total Protein: 8.1 g/dL (ref 6.0–8.3)

## 2023-05-18 LAB — CK: Total CK: 66 U/L (ref 7–177)

## 2023-05-18 NOTE — Progress Notes (Signed)
   Acute Office Visit  Subjective:     Patient ID: April Donaldson, female    DOB: 1931/08/07, 89 y.o.   MRN: [id number]  Chief Complaint  Patient presents with   Acute Visit    Back pain    HPI Patient is in today for evaluation of left-sided low back and hip pain for the last week. Also reports that she just started taking Zetia, and is wondering if this is related. Has used heat to the area with mild temporary relief. Sees chiropractor for sciatica of the R side.  Has XR scheduled there in a week. Denies dysuria, dark urine, discolored stools, gross hematuria, chills, fever, other new pain.  Denies other concerns Medical hx as outlined below   ROS Per HPI      Objective:    BP (!) 140/80 (BP Location: Left Arm, Patient Position: Sitting)   Pulse (!) 56   Temp 97.6 F (36.4 C) (Temporal)   Ht 4\' 9"  (1.448 m)   Wt 162 lb 9.6 oz (73.8 kg)   SpO2 98%   BMI 35.19 kg/m    Physical Exam Vitals and nursing note reviewed.  Constitutional:      General: She is not in acute distress.    Appearance: Normal appearance. She is obese.  HENT:     Head: Normocephalic and atraumatic.     Nose: Nose normal.  Eyes:     Extraocular Movements: Extraocular movements intact.  Cardiovascular:     Rate and Rhythm: Normal rate.  Pulmonary:     Effort: Pulmonary effort is normal.  Musculoskeletal:        General: Swelling present.     Cervical back: Normal range of motion.     Comments: LROM to back and L hip. No erythema, no bruising, no obvious deformity  Lymphadenopathy:     Cervical: No cervical adenopathy.  Neurological:     General: No focal deficit present.     Mental Status: She is alert and oriented to person, place, and time.  Psychiatric:        Mood and Affect: Mood normal.        Thought Content: Thought content normal.     No results found for any visits on 05/18/23.      Assessment & Plan:   Left flank pain -     Comprehensive metabolic panel -      CK -     CBC with Differential/Platelet  Mixed hyperlipidemia  Will check labs to help r/o drug related myopathy  No orders of the defined types were placed in this encounter.   Return if symptoms worsen or fail to improve.  Moshe Cipro, FNP

## 2023-05-18 NOTE — Patient Instructions (Signed)
 We are checking labs today, will be in contact with any results that require further attention  Keep the xray appt and appt with chiropractor.  Follow-up with me for new or worsening symptoms.

## 2023-05-22 NOTE — Telephone Encounter (Signed)
 She will need appointment with PharmD clinic to talk about non-statin options.  Please send to scheduling

## 2023-05-22 NOTE — Telephone Encounter (Signed)
PharmD referral placed

## 2023-05-25 ENCOUNTER — Ambulatory Visit (INDEPENDENT_AMBULATORY_CARE_PROVIDER_SITE_OTHER): Admitting: Sports Medicine

## 2023-05-25 ENCOUNTER — Other Ambulatory Visit (INDEPENDENT_AMBULATORY_CARE_PROVIDER_SITE_OTHER): Payer: Self-pay

## 2023-05-25 DIAGNOSIS — R29898 Other symptoms and signs involving the musculoskeletal system: Secondary | ICD-10-CM | POA: Diagnosis not present

## 2023-05-25 DIAGNOSIS — M25552 Pain in left hip: Secondary | ICD-10-CM

## 2023-05-25 DIAGNOSIS — G8929 Other chronic pain: Secondary | ICD-10-CM

## 2023-05-25 DIAGNOSIS — M76899 Other specified enthesopathies of unspecified lower limb, excluding foot: Secondary | ICD-10-CM

## 2023-05-25 NOTE — Progress Notes (Signed)
 Patient says that she goes to a chiropractor monthly for maintenance, and he wanted her to have x-rays and evaluation prior to treating new pain. She describes and points to pain over ischial tuberosity which comes and goes. She notices it mostly when she sits for awhile and sometimes when she walks. She does have sciatica on the right side and says that this feels different from that. She does not take pain medication, and because this discomfort comes and goes she does not take anything for it when it is bothersome.  Patient was instructed in 10 minutes of therapeutic exercises for left hamstring to improve strength, ROM and function according to my instructions and plan of care by a Certified Athletic Trainer during the office visit. A customized handout was provided and demonstration of proper technique shown and discussed. Patient did perform exercises and demonstrate understanding through teachback.  All questions discussed and answered.

## 2023-05-25 NOTE — Progress Notes (Signed)
 April Donaldson - 89 y.o. female MRN [id number]  Date of birth: 07/08/1931  Office Visit Note: Visit Date: 05/25/2023 PCP: Myrlene Broker, MD Referred by: Myrlene Broker, *  Subjective: Chief Complaint  Patient presents with   Left Hip - Pain   HPI: April Donaldson is a pleasant 89 y.o. female who presents today for posterior left hip pain that has been present for over 1 month.  She points to pain near the ischial tuberosity.  This comes and goes and is not present all the time.  She does have pain when sitting for longer periods of time or with walking.  She has had a history of sciatica on the right side past but denies any active symptoms.  She is not taking any pain medication.  She is undergoing chiropractic treatment and wanted to have this evaluated today to help decide what was appropriate in terms of treatment.  Pertinent ROS were reviewed with the patient and found to be negative unless otherwise specified above in HPI.   Assessment & Plan: Visit Diagnoses:  1. Chronic left hip pain   2. Hamstring tendinitis at origin   3. Weakness of both hips    Plan: Impression is chronic left posterior hip pain which her symptoms and exam fit more of a proximal hamstring tendinopathy.  She also has some weakness with her pelvis musculature and hip abductor stability.  We did print out a customized handout for rehab exercises for the hip which my athletic trainer Isabelle Course did review with her today.  She will perform these at least 3 times weekly.  She is also safe to resume chiropractic treatment and they can continue soft tissue treatment modalities, as she should feel reassured this is not emanating from within the hip joint nor any significant arthritis. May use tylenol as needed. Follow-up as needed.  Follow-up: Return if symptoms worsen or fail to improve.   Meds & Orders: No orders of the defined types were placed in this encounter.   Orders Placed This Encounter   Procedures   XR HIP UNILAT W OR W/O PELVIS 2-3 VIEWS LEFT     Procedures: No procedures performed      Clinical History: No specialty comments available.  She reports that she has never smoked. She has never used smokeless tobacco.  Recent Labs    05/01/23 1032  LABURIC 7.6*    Objective:    Physical Exam  Gen: Well-appearing, in no acute distress; non-toxic CV: Well-perfused. Warm.  Resp: Breathing unlabored on room air; no wheezing. Psych: Fluid speech in conversation; appropriate affect; normal thought process  Ortho Exam -Left hip: There is mild TTP with deep palpation at the insertion of the ischial tuberosity proximal Xu origin.  The left hip moves fluidly that restriction with internal and external logroll.  Negative FADIR, equivocal FABER testing.  There is some generalized tenderness over bilateral greater trochanteric regions with some mild equivocal weakness with hip abduction.   Imaging:  XR HIP UNILAT W OR W/O PELVIS 2-3 VIEWS LEFT 2 views of the left hip including standing AP, lateral were ordered and  reviewed by myself today.  X-ray shows a femoral head well-seated with the  acetabulum.  There is only minimal arthritic change, which looks excellent  given the patient age.  There is no irregularity of the ischial  tuberosity.    Past Medical/Family/Surgical/Social History: Medications & Allergies reviewed per EMR, new medications updated. Patient Active Problem List   Diagnosis  Date Noted   Chest congestion 04/09/2023   Chronic diastolic heart failure (HCC) 11/15/2022   Allergic reaction 11/09/2021   Acquired deformity of lower leg 10/25/2021   Anemia 10/25/2021   Bilateral shoulder pain 08/12/2021   Cervical pain (neck) 07/28/2021   Bilateral carotid artery stenosis 03/25/2021   Grade II diastolic dysfunction 02/13/2021   Newly recognized heart murmur 01/02/2020   Numbness and tingling in left hand 06/26/2019   Candidal dermatitis 07/21/2016    Bilateral pseudophakia 04/10/2016   Routine general medical examination at a health care facility 06/15/2014   Primary open angle glaucoma of both eyes, mild stage 07/10/2011   Obstructive sleep apnea 11/11/2007   GLAUCOMA 09/22/2007   CKD (chronic kidney disease) stage 3, GFR 30-59 ml/min (HCC) 03/21/2007   Hypothyroidism 01/04/2007   Hyperlipidemia 01/04/2007   GOUT 01/04/2007   Essential hypertension 01/04/2007   Past Medical History:  Diagnosis Date   Anxiety    Benign neoplasm of kidney, except pelvis    Cellulitis of leg 03/09/2013 hosp    Cerebrovascular disease    Chronic low back pain    Diabetes mellitus without complication (HCC) 10/25/2021   Diastolic CHF (HCC)    Dr Verdis Prime   Difficult intubation    per patient with hand surgery in 2016 at surgical center- lip was pinched and swollen after surgery- patient states dr Mina Marble never stated a problem with intubation    Diverticulosis    DJD (degenerative joint disease)    Dyspnea    with exertion    GERD (gastroesophageal reflux disease)    no meds   Glaucoma    Gout    H/O hiatal hernia    Hepatitis    hx of years ago    Hx of colonic polyps    Hypercholesteremia    Hypertension    Hypothyroid    IBS (irritable bowel syndrome)    Internal hemorrhoids    Obesity    OSA (obstructive sleep apnea)    NPSG 2009: AHI 20/h. CPAP - follows with [facility]   Renal insufficiency    one kidney small and non funtioning   Tubular adenoma 2000   Venous insufficiency    Family History  Problem Relation Age of Onset   Heart disease Mother    Diabetes Mother    Congestive Heart Failure Mother    Asthma Brother    Throat cancer Brother 36   Asthma Sister    Brain cancer Sister 48   Breast cancer Sister 27   Colon cancer Neg Hx    Esophageal cancer Neg Hx    Stomach cancer Neg Hx    Pancreatic cancer Neg Hx    Past Surgical History:  Procedure Laterality Date   ABDOMINAL HYSTERECTOMY     APPENDECTOMY      CATARACT EXTRACTION, BILATERAL Bilateral 2017   cataracts Bilateral    COLONOSCOPY     CYSTOSCOPY/RETROGRADE/URETEROSCOPY Bilateral 04/09/2017   Procedure: CYSTOSCOPY/RETROGRADE;  Surgeon: Heloise Purpura, MD;  Location: [facility];  Service: Urology;  Laterality: Bilateral;  ONLY NEEDS 30 MIN FOR PROCEDURE   DILATION AND CURETTAGE OF UTERUS     FUNCTIONAL ENDOSCOPIC SINUS SURGERY  05/2003   Dr. Haroldine Laws   hysterctomy and ooperectomy  1970's   MASS EXCISION  2003   lt   MASS EXCISION Left 09/17/2013   Procedure: EXCISION MASS LEFT HAND;  Surgeon: Marlowe Shores, MD;  Location: [facility];  Service: Orthopedics;  Laterality: Left;   thyroid  lobectomy for a cyst  10/2000   Dr. Haroldine Laws   TONSILLECTOMY     Social History   Occupational History   Occupation: retired from worked in Doctor, hospital: RETIRED  Tobacco Use   Smoking status: Never   Smokeless tobacco: Never  Vaping Use   Vaping status: Never Used  Substance and Sexual Activity   Alcohol use: No    Alcohol/week: 0.0 standard drinks of alcohol   Drug use: No   Sexual activity: Not Currently

## 2023-05-26 ENCOUNTER — Encounter: Payer: Self-pay | Admitting: Sports Medicine

## 2023-07-10 ENCOUNTER — Telehealth: Payer: Self-pay

## 2023-07-10 ENCOUNTER — Ambulatory Visit: Payer: Self-pay | Admitting: Nurse Practitioner

## 2023-07-10 LAB — LIPID PANEL
Chol/HDL Ratio: 4.4 ratio (ref 0.0–4.4)
Cholesterol, Total: 256 mg/dL — ABNORMAL HIGH (ref 100–199)
HDL: 58 mg/dL (ref >39)
LDL Chol Calc (NIH): 171 mg/dL — ABNORMAL HIGH (ref 0–99)
Triglycerides: 149 mg/dL (ref 0–149)
VLDL Cholesterol Cal: 27 mg/dL (ref 5–40)

## 2023-07-10 LAB — HEPATIC FUNCTION PANEL
ALT: 10 IU/L (ref 0–32)
AST: 14 IU/L (ref 0–40)
Albumin: 4.4 g/dL (ref 3.7–4.7)
Alkaline Phosphatase: 83 IU/L (ref 44–121)
Bilirubin Total: 0.5 mg/dL (ref 0.0–1.2)
Bilirubin, Direct: 0.16 mg/dL (ref 0.00–0.40)
Total Protein: 7.5 g/dL (ref 6.0–8.5)

## 2023-07-10 NOTE — Telephone Encounter (Signed)
 Gerald Kitty., NP  Staley, Demetris R, LPN1 hour ago (11:57 AM)    Please advise patient to continue current dose of atenolol  and adjustment can be made if she is experiencing increased fatigue or dizziness.  Please also have her check her heart rate with movement to see if it is increasing and may be low at rest which is an intended effect of the medication.  Please let me know if you have any further questions.

## 2023-07-10 NOTE — Telephone Encounter (Signed)
Patient notified directly and voiced understanding.  

## 2023-07-10 NOTE — Telephone Encounter (Signed)
 Contacted the patient to discuss lab results and she state her pulse has been running low.  Patient states states she checked her blood pressure and her heart rate has decreased in the forty's. Advised the patient that I would have triage nurse contact her to get more information. She voiced understanding.

## 2023-07-10 NOTE — Telephone Encounter (Signed)
 Spoke with patient and she states her HR has been low. The last couple weeks its been in the 40's and has not gone back up. Denies any chest pain or SOB of breath. She would like to know if her atenolol  needs to be adjusted. BP 128/74 HR 47. Asymptomatic. ED precautions discussed

## 2023-07-19 ENCOUNTER — Telehealth: Payer: Self-pay | Admitting: Internal Medicine

## 2023-07-19 NOTE — Telephone Encounter (Signed)
 Spoke with pt over the phone who was calling in about her BP readings being elevated. Pt had stated she had not taken any blood pressure medications yet today and was also worried about her HR being below 50. Pt advised to go ahead and take her Lisinopril  and Triamterene -hydrochlorothiazide  for her elevated BP but to hold the Atenolol  until we reviewed her HR with a provider. Pt also advised to take these medication and retake BP reading about 2 hrs after taking the medications. Pt verbalized understanding of plan and agreed with plan.

## 2023-07-19 NOTE — Telephone Encounter (Signed)
 Pt c/o BP issue: STAT if pt c/o blurred vision, one-sided weakness or slurred speech.  STAT if BP is GREATER than 180/120 TODAY.  STAT if BP is LESS than 90/60 and SYMPTOMATIC TODAY  1. What is your BP concern? Elevated bp  2. Have you taken any BP medication today? Not yet  3. What are your last 5 BP readings? this morning -176/98 hr 47, last night 8pm 163/87 hr 47  4. Are you having any other symptoms (ex. Dizziness, headache, blurred vision, passed out)? No

## 2023-07-23 NOTE — Progress Notes (Unsigned)
 Office Visit    Patient Name: April Donaldson Date of Encounter: 07/25/2023  Primary Care Provider:  Adelia Homestead, MD Primary Cardiologist:  Jann Melody, MD  Chief Complaint    Hyperlipidemia   Significant Past Medical History   CVA   HTN Not at < 130/80, on atenolol , lisinopril  15 mg, triamterene /hctz 37.5/25  hypothyroid 3/25 TSH WNL on levothyroxine  50 mcg  OSA Has CPAP        Allergies  Allergen Reactions   Amlodipine  Besylate Swelling and Other (See Comments)    Causes swelling   Clonidine Nausea Only and Other (See Comments)    Sweating, felt faint   Diltiazem Hcl Swelling and Other (See Comments)    meds did not work for her----causes swelling   Klonopin  [Clonazepam ] Other (See Comments)    Vivid dreams   Tetracycline Swelling   Doxycycline Hyclate Other (See Comments)    REACTION:  tetracycline   Hydralazine Rash   Iodine Other (See Comments)    IVP contrast Pt. States topical iodine is fine to use without any irritation. H.Mickley RN   Labetalol Hcl Other (See Comments)    Unknown   Lidocaine  Rash   Olmesartan Medoxomil Other (See Comments)    Unknown    History of Present Illness    April Donaldson is a 89 y.o. female patient of Dr Paulita Boss, in the office today to discuss options for cholesterol management.  She had been on simvastatin  for some time, and when her labs (in March) showed LDL hovering around 100, she was switched to rosuvastatin  10 mg daily.   Her pharmacy told her not to take it because of an allergy to iodine (??) and she ended up on ezetimibe .   She called in about 10 days later reporting ezetimibe  was causing issues.  She was told to stop and labs repeated just this month showed LDL increased to 171.   She states she called several times to determine if she should continue with medications, but got no answers.  She is currently on no medications.    She also notes that her heart rate continues to be low.  Atenolol   was recently decreased from 100 mg to 50 mg daily, but her HR is still dropping into the 40's regularly.    Insurance Carrier: [employer] - through employer   Pharmacy:   [facility] [address]    LDL Cholesterol goal:  LDL < 70  Current Medications:  none  BP Medications:  lisinopril  10 mg am, 5 mg pm, triamterene /hctz 37.5/25 mg every day, atenolol  50 mg every day   Previously tried:  simvastatin  - no problems, just not to LDL goal    BP: amlodipine  - edema         Clonidine - nausea         Diltiazem - edema         Hydralazine - rash         Olmesartan - unknown         Labetalol - unknown (bradycardia?)  Diet:  eats at home, cooks from scratch    Exercise: limited by health, walks some, does household chores   Accessory Clinical Findings   Lab Results  Component Value Date   CHOL 256 (H) 07/09/2023   HDL 58 07/09/2023   LDLCALC 171 (H) 07/09/2023   TRIG 149 07/09/2023   CHOLHDL 4.4 07/09/2023    No results found for: "LIPOA"  Lab Results  Component  Value Date   ALT 10 07/09/2023   AST 14 07/09/2023   ALKPHOS 83 07/09/2023   BILITOT 0.5 07/09/2023   Lab Results  Component Value Date   CREATININE 1.14 05/18/2023   BUN 26 (H) 05/18/2023   NA 139 05/18/2023   K 4.4 05/18/2023   CL 102 05/18/2023   CO2 29 05/18/2023   Lab Results  Component Value Date   HGBA1C 5.5 10/25/2021    Home Medications    Current Outpatient Medications  Medication Sig Dispense Refill   rosuvastatin  (CRESTOR ) 10 MG tablet Take 1 tablet (10 mg total) by mouth daily. 90 tablet 3   allopurinol  (ZYLOPRIM ) 100 MG tablet TAKE 1 TABLET BY MOUTH EVERY DAY 90 tablet 2   aspirin  81 MG tablet Take 81 mg by mouth daily.     atenolol  (TENORMIN ) 50 MG tablet Take 1 tablet (50 mg total) by mouth daily. 90 tablet 3   dorzolamide -timolol  (COSOPT ) 22.3-6.8 MG/ML ophthalmic solution Place 1 drop into both eyes 2 (two) times daily.     latanoprost  (XALATAN ) 0.005 % ophthalmic solution Place 1  drop into both eyes at bedtime.     levothyroxine  (SYNTHROID ) 50 MCG tablet TAKE 1 TABLET BY MOUTH DAILY BEFORE BREAKFAST 90 tablet 0   lisinopril  (ZESTRIL ) 10 MG tablet Take 1 tablet (10 mg total) by mouth in the morning. 90 tablet 3   lisinopril  (ZESTRIL ) 5 MG tablet Take 1 tablet (5 mg total) by mouth at bedtime. 90 tablet 3   nystatin  cream (MYCOSTATIN ) Apply to affected area 2 times daily 30 g 0   triamterene -hydrochlorothiazide  (DYAZIDE ) 37.5-25 MG capsule TAKE 1 CAPSULE BY MOUTH EVERY DAY 90 capsule 1   Wheat Dextrin (BENEFIBER DRINK MIX PO) Take by mouth daily.     No current facility-administered medications for this visit.     Assessment & Plan    Hyperlipidemia Assessment: Patient with ASCVD not at LDL goal of < 70 Most recent LDL 171 on 07/09/23 Not currently on statin 2/2 confusion with pharmacy, tolerated simvastatin  without issue Reviewed options for lowering LDL cholesterol, specifically safety of rosuvastatin .   Plan: Patient agreeable to starting rosuvastatin  10 mg once daily Repeat labs after:  3 months Lipid Liver function    Essential hypertension Assessment: BP is uncontrolled in office BP 176/84 mmHg;  above the goal (<130/80). No home readings to compare Tolerates current medications well, without any side effects Denies SOB, palpitation, chest pain, headaches,or swelling Notes HR commonly in the 40's Reiterated the importance of regular exercise and low salt diet   Plan:  Decrease atenolol  to 25 mg once daily Continue taking  lisinopril  10 mg am, 5 mg pm, triamterene /hctz 37.5/25 mg every day Unsure if high BP reading is normal or secondary to frustration trying to navigate new office Patient to keep record of BP readings with heart rate and report to us  at the next visit Patient to follow up with Dr. Paulita Boss in August.  Labs ordered today:  none   Donivan Furry, PharmD CPP [facility] [address]  [place], [place]  [address] [phone]  07/25/2023, 7:25 AM

## 2023-07-24 ENCOUNTER — Ambulatory Visit: Admitting: Internal Medicine

## 2023-07-24 ENCOUNTER — Encounter: Payer: Self-pay | Admitting: Pharmacist Clinician (PhC)/ Clinical Pharmacy Specialist

## 2023-07-24 ENCOUNTER — Ambulatory Visit: Attending: Cardiology | Admitting: Pharmacist Clinician (PhC)/ Clinical Pharmacy Specialist

## 2023-07-24 VITALS — BP 176/84 | HR 46

## 2023-07-24 DIAGNOSIS — E782 Mixed hyperlipidemia: Secondary | ICD-10-CM

## 2023-07-24 DIAGNOSIS — I10 Essential (primary) hypertension: Secondary | ICD-10-CM | POA: Diagnosis not present

## 2023-07-24 NOTE — Patient Instructions (Signed)
 Follow up appointment: with Dr. Tita Form on August 1  Take your BP meds as follows:  DECREASE ATENOLOL  TO 25 MG ONCE DAILY (CUT THE 50 MG TABLETS IN HALF)  Take your CHOLESTEROL meds as follows:  START ROSUVASTATIN  10 MG ONCE DAILY   CONTINUE WITH EZETIMIBE  10 MG ONCE DAILY  Check your blood pressure at home twice daily 3-4 days per week and keep record of the readings.  Your blood pressure goal is < 130/80  To check your pressure at home you will need to:  1. Sit up in a chair, with feet flat on the floor and back supported. Do not cross your ankles or legs. 2. Rest your left arm so that the cuff is about heart level. If the cuff goes on your upper arm,  then just relax the arm on the table, arm of the chair or your lap. If you have a wrist cuff, we  suggest relaxing your wrist against your chest (think of it as Pledging the Flag with the  wrong arm).  3. Place the cuff snugly around your arm, about 1 inch above the crook of your elbow. The  cords should be inside the groove of your elbow.  4. Sit quietly, with the cuff in place, for about 5 minutes. After that 5 minutes press the power  button to start a reading. 5. Do not talk or move while the reading is taking place.  6. Record your readings on a sheet of paper. Although most cuffs have a memory, it is often  easier to see a pattern developing when the numbers are all in front of you.  7. You can repeat the reading after 1-3 minutes if it is recommended  Make sure your bladder is empty and you have not had caffeine or tobacco within the last 30 min  Always bring your blood pressure log with you to your appointments. If you have not brought your monitor in to be double checked for accuracy, please bring it to your next appointment.  You can find a list of quality blood pressure cuffs at [email or web address]  Important lifestyle changes to control high blood pressure  Intervention  Effect on the BP  Lose extra pounds and watch your  waistline Weight loss is one of the most effective lifestyle changes for controlling blood pressure. If you're overweight or obese, losing even a small amount of weight can help reduce blood pressure. Blood pressure might go down by about 1 millimeter of mercury (mm Hg) with each kilogram (about 2.2 pounds) of weight lost.  Exercise regularly As a general goal, aim for at least 30 minutes of moderate physical activity every day. Regular physical activity can lower high blood pressure by about 5 to 8 mm Hg.  Eat a healthy diet Eating a diet rich in whole grains, fruits, vegetables, and low-fat dairy products and low in saturated fat and cholesterol. A healthy diet can lower high blood pressure by up to 11 mm Hg.  Reduce salt (sodium) in your diet Even a small reduction of sodium in the diet can improve heart health and reduce high blood pressure by about 5 to 6 mm Hg.  Limit alcohol One drink equals 12 ounces of beer, 5 ounces of wine, or 1.5 ounces of 80-proof liquor.  Limiting alcohol to less than one drink a day for women or two drinks a day for men can help lower blood pressure by about 4 mm Hg.   If you have any questions  or concerns please use My Chart to send questions or call the office at [phone]

## 2023-07-25 ENCOUNTER — Encounter: Payer: Self-pay | Admitting: Pharmacist Clinician (PhC)/ Clinical Pharmacy Specialist

## 2023-07-25 MED ORDER — ROSUVASTATIN CALCIUM 10 MG PO TABS: 10.0000 mg | ORAL_TABLET | Freq: Every day | ORAL | 3 refills | Status: AC

## 2023-07-25 NOTE — Assessment & Plan Note (Signed)
 Assessment: BP is uncontrolled in office BP 176/84 mmHg;  above the goal (<130/80). No home readings to compare Tolerates current medications well, without any side effects Denies SOB, palpitation, chest pain, headaches,or swelling Notes HR commonly in the 40's Reiterated the importance of regular exercise and low salt diet   Plan:  Decrease atenolol  to 25 mg once daily Continue taking  lisinopril  10 mg am, 5 mg pm, triamterene /hctz 37.5/25 mg every day Unsure if high BP reading is normal or secondary to frustration trying to navigate new office Patient to keep record of BP readings with heart rate and report to us  at the next visit Patient to follow up with Dr. Paulita Boss in August.  Labs ordered today:  none

## 2023-07-25 NOTE — Assessment & Plan Note (Signed)
 Assessment: Patient with ASCVD not at LDL goal of < 70 Most recent LDL 171 on 07/09/23 Not currently on statin 2/2 confusion with pharmacy, tolerated simvastatin  without issue Reviewed options for lowering LDL cholesterol, specifically safety of rosuvastatin .   Plan: Patient agreeable to starting rosuvastatin  10 mg once daily Repeat labs after:  3 months Lipid Liver function

## 2023-07-26 NOTE — Telephone Encounter (Signed)
 Pt had OV with Pharm D on 07/24/23: Essential hypertension Assessment: BP is uncontrolled in office BP 176/84 mmHg;  above the goal (<130/80). No home readings to compare Tolerates current medications well, without any side effects Denies SOB, palpitation, chest pain, headaches,or swelling Notes HR commonly in the 40's Reiterated the importance of regular exercise and low salt diet    Plan:  Decrease atenolol  to 25 mg once daily Continue taking  lisinopril  10 mg am, 5 mg pm, triamterene /hctz 37.5/25 mg every day Unsure if high BP reading is normal or secondary to frustration trying to navigate new office Patient to keep record of BP readings with heart rate and report to us  at the next visit Patient to follow up with Dr. Paulita Boss in August.   Called pt reports 140/80-45 today.  Started Atenolol  25 mg yesterday.  Advised pt of potential of increase in funny heart beats d/t dose decrease.  Advised pt to continue to log BP/HR and call in results in 1 week.  Pt is agreeable to plan.

## 2023-08-03 NOTE — Telephone Encounter (Signed)
 Pt called in to report SBP ranges 120-137 with HR 46-52.   Pt was advised to decrease Atenolol  to 25 mg on 07/24/23 by Pharmacy clinic.   Advised pt to stop Atenolol  per MD note:"Presently she has asymptomatic bradycardia. No intervention should be required outside of Taking her off of beta  blocker."  Advised pt to stop Atenolol  continue to monitor BP/HR and if has an increase in funny heart beats.  Pt expresses understanding.   Atenolol  removed from med list.

## 2023-08-03 NOTE — Addendum Note (Signed)
 Addended by: Christine Cozier on: 08/03/2023 11:17 AM   Modules accepted: Orders

## 2023-08-10 ENCOUNTER — Other Ambulatory Visit: Payer: Self-pay | Admitting: Internal Medicine

## 2023-08-10 NOTE — Telephone Encounter (Signed)
 Pt calling to give the nurse a update. Please advise

## 2023-08-10 NOTE — Telephone Encounter (Signed)
 Spoke with the patient- She reports that since stopping the Atenolol  her heart is not doing as many of the funny heart beats.   HR ranges- 50-60s BP's- 130's over 74-77 only had one on the 10th= 140/76  She will continue to keep a log of BP and hr and bring to her next appt. She will go for lab work a few days before appt- will need to be fasting. She verbalized understanding. Informed that I will send this information to her provider.

## 2023-08-14 NOTE — Telephone Encounter (Signed)
 Called pt advised of MD response.  Pt reports BP 144/83-58 prior to taking BP meds.  Advised to check BP 1.5-2 hours after taking BP meds.  Pt expresses understanding. Will have fasting blood work the end of July for increase in rosuvastatin  to 10 mg.   Pt reports has a hx of a hernia and has intermittent discomfort to abdomen in morning when waking up.  Advised to f/u with PCP or GI.   All questions answered pt thanked me for calling.

## 2023-09-14 LAB — COMPREHENSIVE METABOLIC PANEL WITH GFR
ALT: 12 IU/L (ref 0–32)
AST: 18 IU/L (ref 0–40)
Albumin: 4.3 g/dL (ref 3.7–4.7)
Alkaline Phosphatase: 71 IU/L (ref 44–121)
BUN/Creatinine Ratio: 19 (ref 12–28)
BUN: 26 mg/dL (ref 8–27)
Bilirubin Total: 0.6 mg/dL (ref 0.0–1.2)
CO2: 22 mmol/L (ref 20–29)
Calcium: 9.3 mg/dL (ref 8.7–10.3)
Chloride: 104 mmol/L (ref 96–106)
Creatinine, Ser: 1.36 mg/dL — ABNORMAL HIGH (ref 0.57–1.00)
Globulin, Total: 2.8 g/dL (ref 1.5–4.5)
Glucose: 81 mg/dL (ref 70–99)
Potassium: 4.3 mmol/L (ref 3.5–5.2)
Sodium: 141 mmol/L (ref 134–144)
Total Protein: 7.1 g/dL (ref 6.0–8.5)
eGFR: 37 mL/min/1.73 — ABNORMAL LOW (ref >59)

## 2023-09-14 LAB — LIPID PANEL
Chol/HDL Ratio: 2.6 ratio (ref 0.0–4.4)
Cholesterol, Total: 150 mg/dL (ref 100–199)
HDL: 58 mg/dL (ref >39)
LDL Chol Calc (NIH): 70 mg/dL (ref 0–99)
Triglycerides: 124 mg/dL (ref 0–149)
VLDL Cholesterol Cal: 22 mg/dL (ref 5–40)

## 2023-09-19 ENCOUNTER — Ambulatory Visit: Payer: Self-pay

## 2023-09-28 ENCOUNTER — Ambulatory Visit: Attending: Internal Medicine | Admitting: Internal Medicine

## 2023-09-28 VITALS — BP 146/80 | HR 58 | Ht 59.0 in | Wt 162.4 lb

## 2023-09-28 DIAGNOSIS — G4733 Obstructive sleep apnea (adult) (pediatric): Secondary | ICD-10-CM | POA: Diagnosis not present

## 2023-09-28 DIAGNOSIS — R001 Bradycardia, unspecified: Secondary | ICD-10-CM

## 2023-09-28 DIAGNOSIS — E782 Mixed hyperlipidemia: Secondary | ICD-10-CM | POA: Diagnosis not present

## 2023-09-28 DIAGNOSIS — I10 Essential (primary) hypertension: Secondary | ICD-10-CM | POA: Diagnosis not present

## 2023-09-28 NOTE — Patient Instructions (Signed)
 Medication Instructions:  Your physician recommends that you continue on your current medications as directed. Please refer to the Current Medication list given to you today.  *If you need a refill on your cardiac medications before your next appointment, please call your pharmacy*  Lab Work: NONE  If you have labs (blood work) drawn today and your tests are completely normal, you will receive your results only by: MyChart Message (if you have MyChart) OR A paper copy in the mail If you have any lab test that is abnormal or we need to change your treatment, we will call you to review the results.  Testing/Procedures: NONE  Follow-Up: At [facility], you and your health needs are our priority.  As part of our continuing mission to provide you with exceptional heart care, our providers are all part of one team.  This team includes your primary Cardiologist (physician) and Advanced Practice Providers or APPs (Physician Assistants and Nurse Practitioners) who all work together to provide you with the care you need, when you need it.  Your next appointment:   1 year(s)  Provider:   Stanly DELENA Leavens, MD or Jackee Alberts, NP        We recommend signing up for the patient portal called MyChart.  Sign up information is provided on this After Visit Summary.  MyChart is used to connect with patients for Virtual Visits (Telemedicine).  Patients are able to view lab/test results, encounter notes, upcoming appointments, etc.  Non-urgent messages can be sent to your provider as well.   To learn more about what you can do with MyChart, go to [email or web address].

## 2023-09-28 NOTE — Progress Notes (Signed)
 Cardiology Office Note:  .    Date:  09/28/2023  ID:  OTHELIA Donaldson, DOB 04/02/1931, MRN [id number] PCP: Rollene Almarie LABOR, MD  [facility] Providers Cardiologist:  Stanly LABOR Leavens, MD     CC: BP f/u visit  History of Present Illness: .    April Donaldson is a 89 y.o. female with asymptomatic bradycardia and supraventricular tachycardia who presents for cardiovascular follow-up. She was referred by Dr. Claudene in 2023 for cardiovascular care upon his retirement.  She has a history of asymptomatic bradycardia and rare asymptomatic supraventricular tachycardia noted on heart monitoring. Her atenolol  was reduced due to concerns about bradycardia and fatigue.  Her blood pressure has been variable, with a peak reading of 180 mmHg during an episode of disorientation in the building, but it has since stabilized to a range between 117/63 and 160/81 mmHg. Blood pressure tends to rise with exertion and normalizes with rest.  She experiences a lack of appetite, weakness, and back pain, which are being managed by another physician. She also reports stress related to navigating the building.  She has gained weight around her waist and experiences occasional abdominal pain, with uncertainty about its relation to a hernia.  She uses a CPAP machine for sleep apnea and has concerns about its impact on her nighttime symptoms, including waking up feeling like she is 'thrashing around'. The CPAP was checked and is functioning properly.  She feels more active and capable than before, despite occasional sciatica and needing to catch her breath after climbing stairs. No significant chest pain, though she sometimes feels her heart beating when overexerted, which resolves with rest.  No significant chest pain. Breathing is generally fine, though she experiences shortness of breath with long-distance walking. She feels her heart beating when overexerted, which resolves with rest.  Discussed the  use of AI scribe software for clinical note transcription with the patient, who gave verbal consent to proceed.   Relevant histories: .  Social  2023: Saw Dr. Claudene and reviewed questions.  2024: Established with me.  Active in church 2025: Stress and associated hypertension ROS: As per HPI.   Studies Reviewed: .     Cardiac Studies & Procedures   ______________________________________________________________________________________________     ECHOCARDIOGRAM  ECHOCARDIOGRAM COMPLETE 12/06/2022  Narrative ECHOCARDIOGRAM REPORT    Patient Name:   April Donaldson Date of Exam: 12/05/2022 Medical Rec #:  [id number]      Height:       57.0 in Accession #:    [id number]     Weight:       166.0 lb Date of Birth:  Mar 27, 1931     BSA:          1.662 m Patient Age:    89 years       BP:           150/88 mmHg Patient Gender: F              HR:           48 bpm. Exam Location:  [address]  Procedure: 2D Echo, Color Doppler, Cardiac Doppler and 3D Echo  Indications:    R01.1 Murmur  History:        Patient has prior history of Echocardiogram examinations, most recent 02/12/2020. Risk Factors:Diabetes and Hypertension.  Sonographer:    Augustin Seals RDCS Referring Phys: [id number] [facility] A Daveah Varone  IMPRESSIONS   1. Left ventricular ejection fraction, by estimation, is 55 to 60%. The  left ventricle has normal function. The left ventricle has no regional wall motion abnormalities. There is mild concentric left ventricular hypertrophy. Left ventricular diastolic parameters are consistent with Grade II diastolic dysfunction (pseudonormalization). 2. Right ventricular systolic function is normal. The right ventricular size is normal. There is normal pulmonary artery systolic pressure. 3. Left atrial size was mildly dilated. 4. Right atrial size was mildly dilated. 5. The mitral valve is normal in structure. Mild mitral valve regurgitation. No evidence of mitral  stenosis. 6. The aortic valve is tricuspid. There is mild calcification of the aortic valve. Aortic valve regurgitation is not visualized. Aortic valve sclerosis/calcification is present, without any evidence of aortic stenosis. 7. The inferior vena cava is normal in size with greater than 50% respiratory variability, suggesting right atrial pressure of 3 mmHg.  FINDINGS Left Ventricle: Left ventricular ejection fraction, by estimation, is 55 to 60%. The left ventricle has normal function. The left ventricle has no regional wall motion abnormalities. The left ventricular internal cavity size was normal in size. There is mild concentric left ventricular hypertrophy. Left ventricular diastolic parameters are consistent with Grade II diastolic dysfunction (pseudonormalization).  Right Ventricle: The right ventricular size is normal. No increase in right ventricular wall thickness. Right ventricular systolic function is normal. There is normal pulmonary artery systolic pressure. The tricuspid regurgitant velocity is 2.50 m/s, and with an assumed right atrial pressure of 3 mmHg, the estimated right ventricular systolic pressure is 28.0 mmHg.  Left Atrium: Left atrial size was mildly dilated.  Right Atrium: Right atrial size was mildly dilated.  Pericardium: There is no evidence of pericardial effusion.  Mitral Valve: The mitral valve is normal in structure. Mild mitral annular calcification. Mild mitral valve regurgitation. No evidence of mitral valve stenosis.  Tricuspid Valve: The tricuspid valve is normal in structure. Tricuspid valve regurgitation is mild . No evidence of tricuspid stenosis.  Aortic Valve: The aortic valve is tricuspid. There is mild calcification of the aortic valve. Aortic valve regurgitation is not visualized. Aortic valve sclerosis/calcification is present, without any evidence of aortic stenosis.  Pulmonic Valve: The pulmonic valve was normal in structure. Pulmonic valve  regurgitation is trivial. No evidence of pulmonic stenosis.  Aorta: The aortic root is normal in size and structure.  Venous: The inferior vena cava is normal in size with greater than 50% respiratory variability, suggesting right atrial pressure of 3 mmHg.  IAS/Shunts: No atrial level shunt detected by color flow Doppler.   LEFT VENTRICLE PLAX 2D LVIDd:         3.40 cm   Diastology LVIDs:         2.30 cm   LV e' medial:    6.08 cm/s LV PW:         1.20 cm   LV E/e' medial:  15.3 LV IVS:        1.30 cm   LV e' lateral:   5.84 cm/s LVOT diam:     2.00 cm   LV E/e' lateral: 16.0 LV SV:         73 LV SV Index:   44 LVOT Area:     3.14 cm  3D Volume EF: 3D EF:        56 % LV EDV:       146 ml LV ESV:       65 ml LV SV:        82 ml  RIGHT VENTRICLE  IVC RV Basal diam:  3.10 cm     IVC diam: 1.20 cm RV Mid diam:    3.00 cm RV S prime:     16.20 cm/s TAPSE (M-mode): 2.7 cm  LEFT ATRIUM             Index        RIGHT ATRIUM           Index LA diam:        3.70 cm 2.23 cm/m   RA Area:     15.70 cm LA Vol (A2C):   43.0 ml 25.88 ml/m  RA Volume:   37.80 ml  22.75 ml/m LA Vol (A4C):   49.1 ml 29.55 ml/m LA Biplane Vol: 48.5 ml 29.19 ml/m AORTIC VALVE LVOT Vmax:   93.30 cm/s LVOT Vmean:  59.600 cm/s LVOT VTI:    0.233 m  AORTA Ao Root diam: 2.80 cm Ao Asc diam:  3.50 cm  MITRAL VALVE                TRICUSPID VALVE MV Area (PHT): 3.89 cm     TR Peak grad:   25.0 mmHg MV Decel Time: 195 msec     TR Vmax:        250.00 cm/s MV E velocity: 93.30 cm/s MV A velocity: 107.00 cm/s  SHUNTS MV E/A ratio:  0.87         Systemic VTI:  0.23 m Systemic Diam: 2.00 cm  Toribio Fuel MD Electronically signed by Toribio Fuel MD Signature Date/Time: 12/05/2022/12:17:08 PM    Final    MONITORS  LONG TERM MONITOR (3-14 DAYS) 12/05/2022  Narrative   Patient had a minimum heart rate of 42 bpm, maximum heart rate of 154 bpm, and average heart rate of 58 bpm.    Predominant underlying rhythm was sinus rhythm.   Occasional paroxysmal SVT lasting 18 beats at longest.   Isolated PACs were occasional (1.2%).   Isolated PVCs were rare (<1.0%).   No symptoms recorded.  Occasional asymptomatic SVT.       ______________________________________________________________________________________________      Physical Exam:    VS:  BP (!) 146/80 (BP Location: Left Arm, Cuff Size: Normal)   Pulse (!) 58   Ht 4' 11 (1.499 m)   Wt 162 lb 6.4 oz (73.7 kg)   SpO2 97%   BMI 32.80 kg/m    Wt Readings from Last 3 Encounters:  09/28/23 162 lb 6.4 oz (73.7 kg)  05/18/23 162 lb 9.6 oz (73.8 kg)  05/01/23 163 lb (73.9 kg)    Gen: no distress  Neck: No JVD Cardiac: No Rubs or Gallops, no Murmur, regular bradycardia +2 radial pulses Respiratory: Clear to auscultation bilaterally, normal effort, normal  respiratory rate GI: Soft, nontender, non-distended  MS: No  edema;  moves all extremities Integument: Skin feels warm Neuro:  At time of evaluation, alert and oriented to person/place/time/situation  Psych: Normal affect, patient feels ok   ASSESSMENT AND PLAN: .    Heart murmur with mild aortic valve calcifications/sclerosis and mild mitral regurgitation Mild aortic valve calcification and mild mitral regurgitation are present. The heart murmur does not inhibit blood flow, and she reports no significant symptoms. She is active and healthy for her age. No current intervention is needed unless symptoms worsen. - Repeat echocardiogram in 2027 unless clinical changes occur - Monitor for symptoms such as increased fatigue or shortness of breath  [place] Hypertension Blood pressure is variable, ranging from 117/63  to 160/81, with recent averages around 126/76 (log reviewed; Her BP cuff runs slightly higher than our readings). No changes in medication are planned due to current stability. Variability may be due to anxiety. - Monitor blood pressure -  Reassess if blood pressure spikes or symptoms develop  Bradycardia and supraventricular tachycardia Asymptomatic bradycardia and supraventricular tachycardia are present. Heart rates have improved after discontinuation of atenolol . Conservative management is preferred unless symptoms worsen. Supraventricular tachycardia may cause nocturnal palpitations, but medication is not preferred unless symptoms become bothersome. - Monitor heart rate and symptoms - Consider heart monitor if symptoms such as frequent nocturnal palpitations persist - Discuss potential medication options if symptoms become bothersome - getting OSA f/u   Hyperlipidemia Cholesterol levels are stable and at goal. No changes in current management as cholesterol is well-controlled. - Continue current cholesterol management  One year with me or Jackee Stanly Leavens, MD FASE [facility] Cardiologist [facility]  [address] [place], [address] [place], [place] [address] [phone]  9:36 AM

## 2023-10-05 ENCOUNTER — Ambulatory Visit: Payer: Self-pay | Admitting: Pulmonary Disease

## 2023-10-05 NOTE — Telephone Encounter (Signed)
 April Donaldson, can I overbook you next thurs for this pt?

## 2023-10-05 NOTE — Telephone Encounter (Signed)
 I called and spoke with the pt  She denied ever saying that she had any CP  She states she just feels slightly sore sometimes when she wakes up in the am  She also c/o feeling tired- relates to non being able to use CPAP as much as she would like  It makes her feel uncomfortable  She states her reason for calling yesterday was that she wanted to schedule appt with Dr. Neda  I have scheduled her for the next available with him 10/16/23  She feels comfortable with this  Nothing further needed

## 2023-10-05 NOTE — Telephone Encounter (Signed)
Schedule for follow-up appointment with myself or one of the APP's

## 2023-10-05 NOTE — Telephone Encounter (Signed)
 We only follow her for OSA/CPAP. It doesn't seem like the CP is related to CPAP use since she's not using it consistently based on the note? If she feels the the CP is coming from the CPAP, then I'm happy to see her to review settings/adjust things; however, if the CP is unrelated to CPAP use, that would be a new problem so would need to see the doc for consult

## 2023-10-05 NOTE — Telephone Encounter (Signed)
 FYI Only or Action Required?: Action required by provider: Requesting earlier appt with pulm, needs call back asap about further recommendations for symptoms and CPAP issues.  Patient is followed in Pulmonology for OSA, last seen on 03/12/2023 by April Jennet LABOR, MD.  Called Nurse Triage reporting Chest Pain, CPAP non-compliance, CPAP questions/issues, interrupted sleep, and Heart Problem.  Symptoms began several months ago.  Interventions attempted: Other: tried CPAP but issues.  Symptoms are: gradually worsening significant and sporadic.  Triage Disposition: Call EMS 911 Now  Patient/caregiver understands and will follow disposition?: No, refuses disposition     Copied from CRM [id number]. Topic: Clinical - Red Word Triage >> Oct 05, 2023 11:27 AM Rilla NOVAK wrote: Red Word that prompted transfer to Nurse Triage: Chest discomfort, worse in the morning. Not sleeping, new CPAP   ----------------------------------------------------------------------- From previous Reason for Contact - Scheduling: Patient/patient representative is calling to schedule an appointment. Refer to attachments for appointment information.  Per agent, issues with CPAP Reason for Disposition  [1] Chest pain lasts > 5 minutes AND [2] age > 15  Answer Assessment - Initial Assessment Questions E2C2 Pulmonary Triage - Initial Assessment Questions Chief Complaint (e.g., cough, sob, wheezing, fever, chills, sweat or additional symptoms) *Go to specific symptom protocol after initial questions. Chest pain, comes and goes Talking to other docs about it, didn't see anything, but still happening, have gone through some med changes in the last year Soreness under the bust line in that area, in mornings when get up, eventually goes away, not every day, sporadically Beginning to wonder if need to change what doing for meds When get up feels like a soreness midsection area, goes away sometimes Don't want to call it  pain Not noticed any worsening SOB Not noticed any pain in back, neck, jaw lately Don't take anything for the discomfort Goes away after get up and moving, probably lasts over 5 min long sometimes, doesn't go away that fast My concern here is have had problems with heart area also, trying to see what's going on in that area, cardio said maybe talk to her other doctors, didn't take any tests or anything, cardio wasn't familiar with CPAP Didn't keep CPAP on last night, went to bed and 2-3 hours later still not sleeping, when took it off was able to go to sleep, denies acute confusion in the mornings No nausea or excessive sweating Do have heart problem, period, and my heart do sometimes aware of it beating but then it settles down if that's what you're talking about Not been able to use CPAP regularly Been taking naps during the day, don't intend to, but not getting enough sleep the night before Right now feel okay but been concerned Last night was straw that broke the camel's back  How long have symptoms been present? Off and on for past few months  Have you used your inhalers/maintenance medication? No  OXYGEN: Do you wear supplemental oxygen? No  Do you monitor your oxygen levels? No  7. CARDIAC RISK FACTORS: Do you have any history of heart problems or risk factors for heart disease? (e.g., angina, prior heart attack; diabetes, high blood pressure, high cholesterol, smoker, or strong family history of heart disease)     Heart murmur, heart failure  8. PULMONARY RISK FACTORS: Do you have any history of lung disease?  (e.g., blood clots in lung, asthma, emphysema, birth control pills)     OSA     Advised pt go to hospital with hx  and chest pains lasting longer than 5 min, not in full compliance with CPAP, especially if any worsening or chest pain getting more frequent or more intense. Pt requesting earlier appt availability with pulm rather than going to ED. Sending  message to pulm for call back with further recommendations/appt options regarding symptoms and CPAP issues. Please call back at [phone].  Protocols used: Chest Pain-A-AH

## 2023-10-12 IMAGING — DX DG SHOULDER 2+V*L*
3 series · 3 of 3 positions shown · non-contrast
Comparison: None Available.

CLINICAL DATA: Bilateral shoulder pain. 1-2 months of worsening
pain on abduction with decreased range of motion.

EXAM:
LEFT SHOULDER - 2+ VIEW

[shoulder ap (1 of 2)]
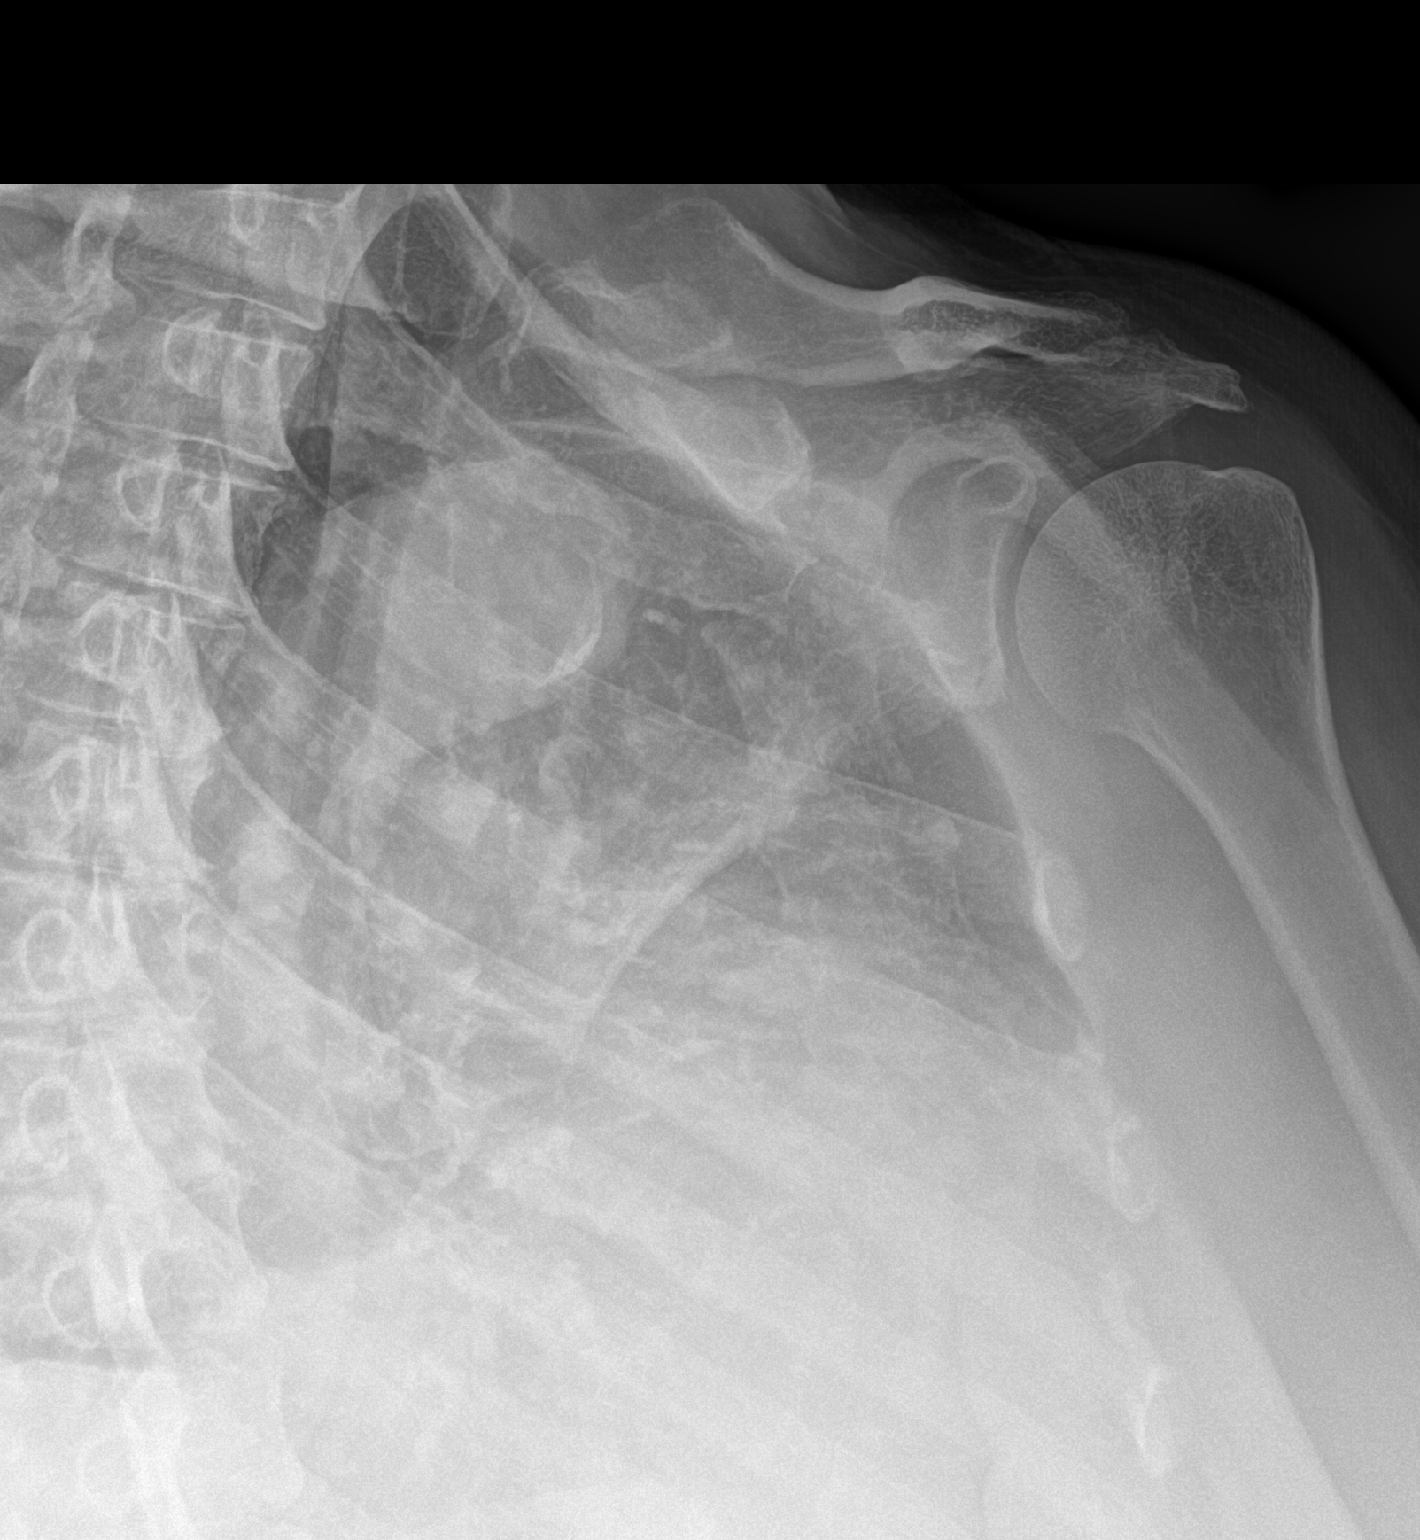

[shoulder ap (2 of 2)]
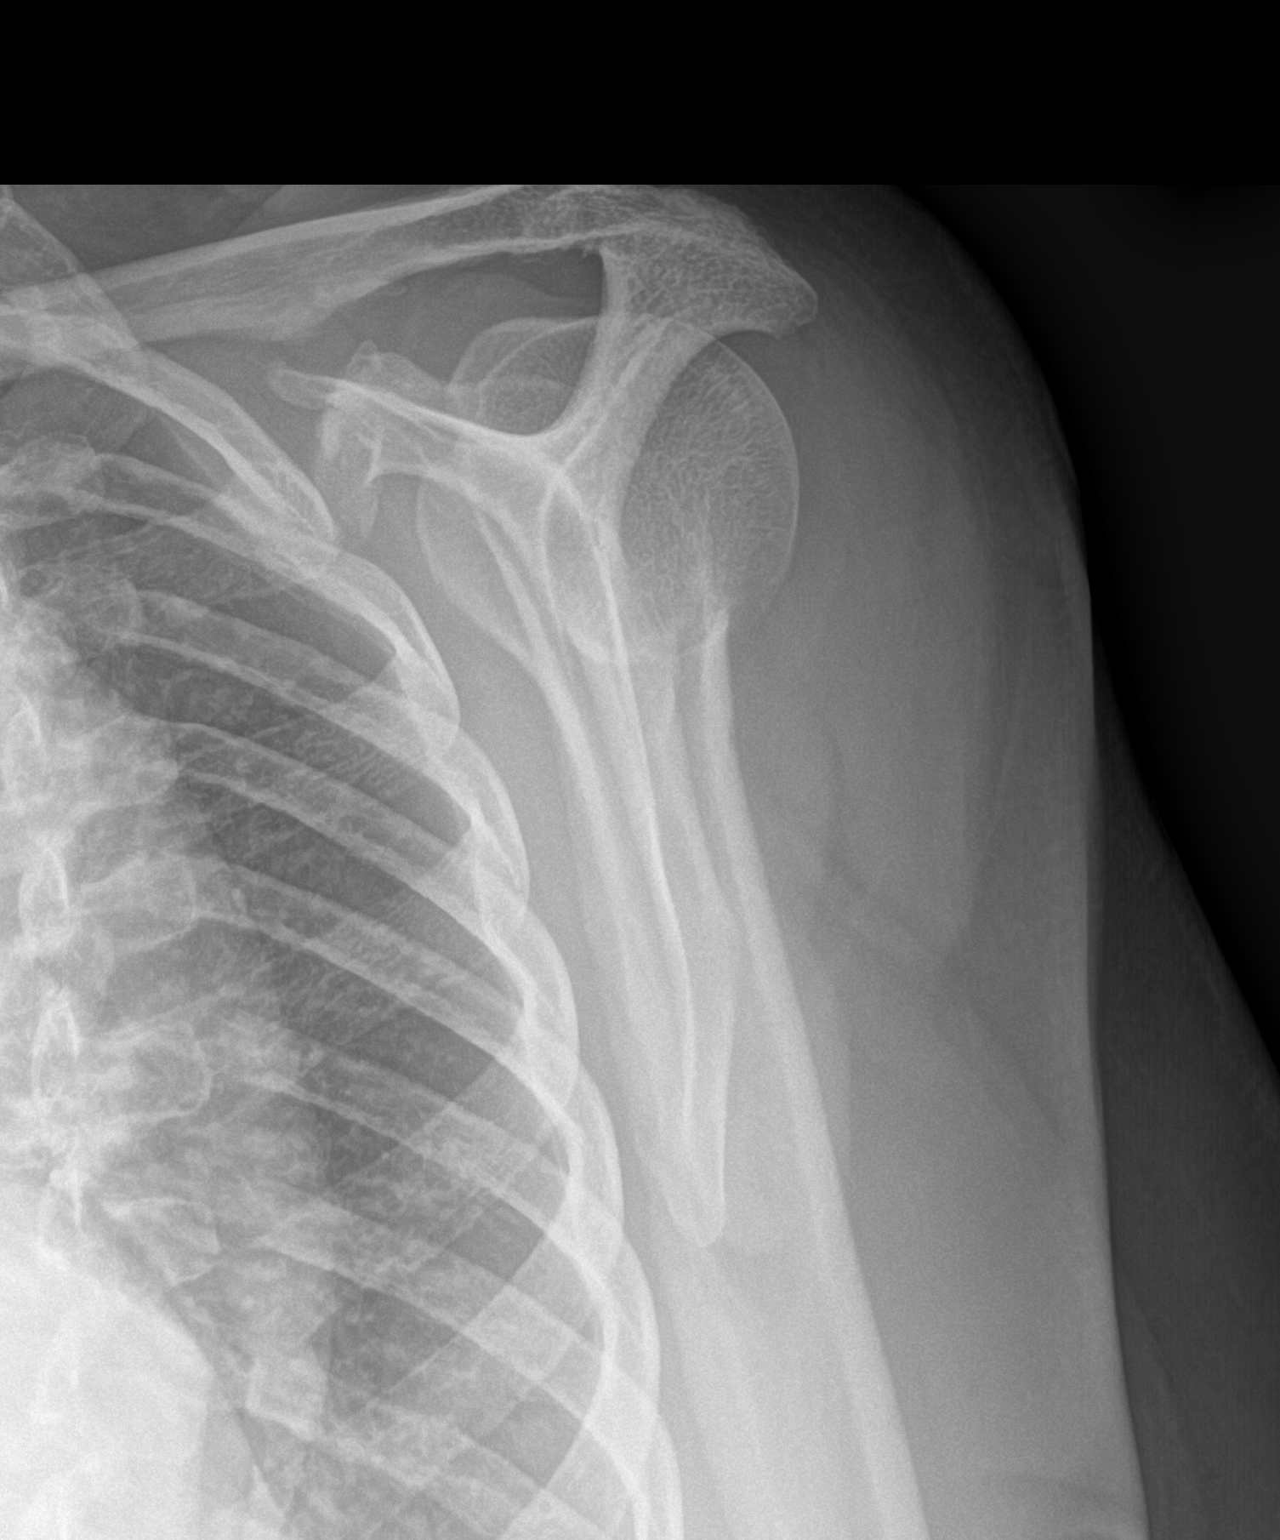

[shoulder axial]
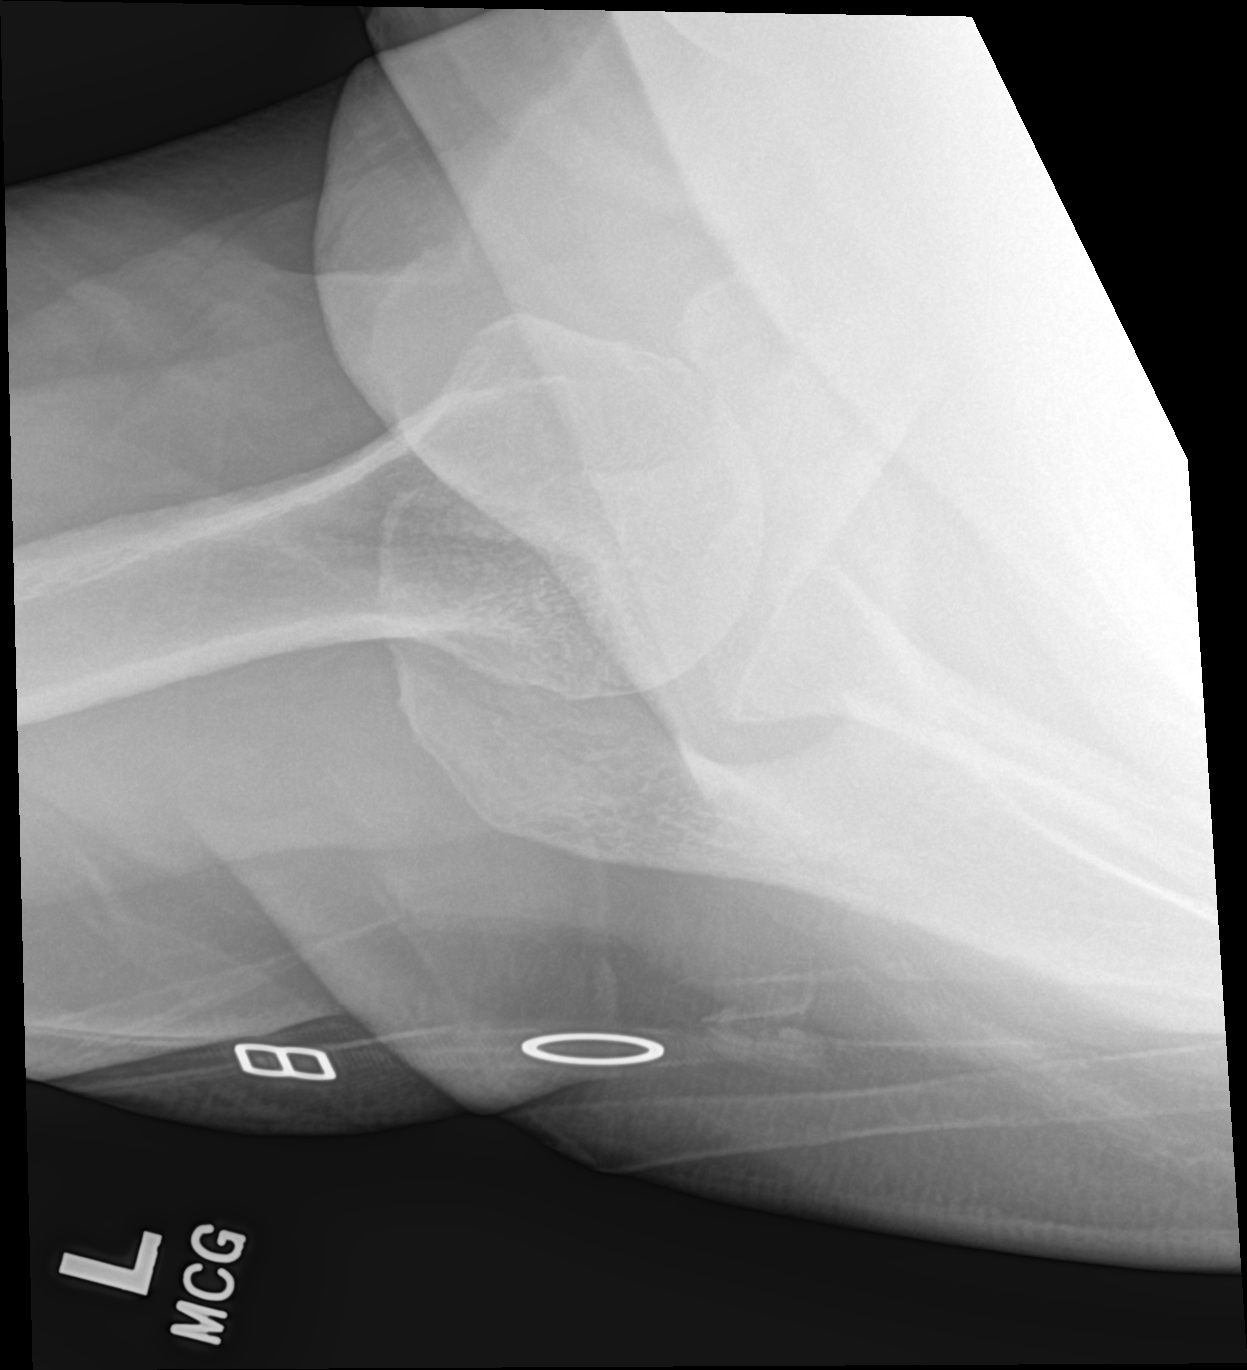

[3 of 3 positions shown; findings below may reference images not displayed]

FINDINGS: There is no evidence of fracture or dislocation. Normal alignment.
Minimal acromioclavicular spurring. Small subacromial spur. No
erosion, avascular necrosis or evidence of focal bone lesion. No
soft tissue calcifications. Soft tissues are unremarkable.
IMPRESSION: Minimal acromioclavicular osteoarthritis and small subacromial spur.

## 2023-10-16 ENCOUNTER — Encounter: Payer: Self-pay | Admitting: Pulmonary Disease

## 2023-10-16 ENCOUNTER — Ambulatory Visit (INDEPENDENT_AMBULATORY_CARE_PROVIDER_SITE_OTHER): Admitting: Pulmonary Disease

## 2023-10-16 VITALS — BP 154/84 | HR 66 | Temp 98.7°F | Ht 59.0 in | Wt 162.8 lb

## 2023-10-16 DIAGNOSIS — G4733 Obstructive sleep apnea (adult) (pediatric): Secondary | ICD-10-CM

## 2023-10-16 NOTE — Progress Notes (Signed)
 April Donaldson    [id number]    Oct 06, 1931  Primary Care Physician:Crawford, Almarie LABOR, MD  Referring Physician: Rollene Almarie LABOR, MD [address] [place],  [place] [address]  Chief complaint:   Follow-up for obstructive sleep apnea  HPI:  Diagnosed with moderate obstructive sleep apnea about 2009 Has been using CPAP since  Recently got a new machine Auto CPAP 5-15  Was having some difficulty with her breathing lately Nonspecific complaints She does have some discomfort around lower rib cage, some bloating  Continues to be compliant with CPAP use on a nightly basis  Does have a history of hypothyroidism, hypertension, hyperlipidemia, glaucoma, gout GERD, CVA, chronic back pain  Never smoker  Also been following up with cardiology   Outpatient Encounter Medications as of 10/16/2023  Medication Sig   allopurinol  (ZYLOPRIM ) 100 MG tablet TAKE 1 TABLET BY MOUTH EVERY DAY   aspirin  81 MG tablet Take 81 mg by mouth daily.   dorzolamide -timolol  (COSOPT ) 22.3-6.8 MG/ML ophthalmic solution Place 1 drop into both eyes 2 (two) times daily.   ezetimibe  (ZETIA ) 10 MG tablet Take 10 mg by mouth daily.   latanoprost  (XALATAN ) 0.005 % ophthalmic solution Place 1 drop into both eyes at bedtime.   levothyroxine  (SYNTHROID ) 50 MCG tablet TAKE 1 TABLET BY MOUTH DAILY BEFORE BREAKFAST   lisinopril  (ZESTRIL ) 10 MG tablet Take 1 tablet (10 mg total) by mouth in the morning.   lisinopril  (ZESTRIL ) 5 MG tablet Take 1 tablet (5 mg total) by mouth at bedtime.   rosuvastatin  (CRESTOR ) 10 MG tablet Take 1 tablet (10 mg total) by mouth daily.   triamterene -hydrochlorothiazide  (DYAZIDE ) 37.5-25 MG capsule TAKE 1 CAPSULE BY MOUTH EVERY DAY   Wheat Dextrin (BENEFIBER DRINK MIX PO) Take by mouth daily.   nystatin  cream (MYCOSTATIN ) Apply to affected area 2 times daily (Patient not taking: Reported on 10/16/2023)   No facility-administered encounter medications on file as of  10/16/2023.    Allergies as of 10/16/2023 - Review Complete 10/16/2023  Allergen Reaction Noted   Amlodipine  besylate Swelling and Other (See Comments)    Clonidine Nausea Only and Other (See Comments) 03/27/2006   Diltiazem hcl Swelling and Other (See Comments)    Klonopin  [clonazepam ] Other (See Comments) 09/17/2013   Tetracycline Swelling 03/19/2015   Doxycycline hyclate Other (See Comments) 03/27/2006   Hydralazine Rash 03/19/2015   Iodine Other (See Comments)    Labetalol hcl Other (See Comments) 03/27/2006   Lidocaine  Rash 10/14/2013   Olmesartan medoxomil Other (See Comments) 08/24/2008    Past Medical History:  Diagnosis Date   Anxiety    Benign neoplasm of kidney, except pelvis    Cellulitis of leg 03/09/2013 hosp    Cerebrovascular disease    Chronic low back pain    Diabetes mellitus without complication (HCC) 10/25/2021   Diastolic CHF (HCC)    Dr Victory Sharps   Difficult intubation    per patient with hand surgery in 2016 at surgical center- lip was pinched and swollen after surgery- patient states dr sissy never stated a problem with intubation    Diverticulosis    DJD (degenerative joint disease)    Dyspnea    with exertion    GERD (gastroesophageal reflux disease)    no meds   Glaucoma    Gout    H/O hiatal hernia    Hepatitis    hx of years ago    Hx of colonic polyps  Hypercholesteremia    Hypertension    Hypothyroid    IBS (irritable bowel syndrome)    Internal hemorrhoids    Obesity    OSA (obstructive sleep apnea)    NPSG 2009: AHI 20/h. CPAP - follows with Clance   Renal insufficiency    one kidney small and non funtioning   Tubular adenoma 2000   Venous insufficiency     Past Surgical History:  Procedure Laterality Date   ABDOMINAL HYSTERECTOMY     APPENDECTOMY     CATARACT EXTRACTION, BILATERAL Bilateral 2017   cataracts Bilateral    COLONOSCOPY     CYSTOSCOPY/RETROGRADE/URETEROSCOPY Bilateral 04/09/2017   Procedure:  CYSTOSCOPY/RETROGRADE;  Surgeon: Renda Glance, MD;  Location: [facility];  Service: Urology;  Laterality: Bilateral;  ONLY NEEDS 30 MIN FOR PROCEDURE   DILATION AND CURETTAGE OF UTERUS     FUNCTIONAL ENDOSCOPIC SINUS SURGERY  05/2003   Dr. Floy   hysterctomy and ooperectomy  1970's   MASS EXCISION  2003   lt   MASS EXCISION Left 09/17/2013   Procedure: EXCISION MASS LEFT HAND;  Surgeon: Donnice DELENA Robinsons, MD;  Location: [facility];  Service: Orthopedics;  Laterality: Left;   thyroid  lobectomy for a cyst  10/2000   Dr. Floy   TONSILLECTOMY      Family History  Problem Relation Age of Onset   Heart disease Mother    Diabetes Mother    Congestive Heart Failure Mother    Asthma Brother    Throat cancer Brother 33   Asthma Sister    Brain cancer Sister 24   Breast cancer Sister 43   Colon cancer Neg Hx    Esophageal cancer Neg Hx    Stomach cancer Neg Hx    Pancreatic cancer Neg Hx     Social History   Socioeconomic History   Marital status: Single    Spouse name: Not on file   Number of children: 1   Years of education: Not on file   Highest education level: Not on file  Occupational History   Occupation: retired from worked in Doctor, hospital: RETIRED  Tobacco Use   Smoking status: Never   Smokeless tobacco: Never  Vaping Use   Vaping status: Never Used  Substance and Sexual Activity   Alcohol use: No    Alcohol/week: 0.0 standard drinks of alcohol   Drug use: No   Sexual activity: Not Currently  Other Topics Concern   Not on file  Social History Narrative   Lives alone, indep - never married   g-dtr in [place], siblings, nieces in town   Keeps in touch with a few friends who talk regularly   Social Drivers of Health   Financial Resource Strain: Low Risk  (04/17/2023)   Overall Financial Resource Strain (CARDIA)    Difficulty of Paying Living Expenses: Not hard at all  Food Insecurity: No Food Insecurity (04/17/2023)   Hunger  Vital Sign    Worried About Running Out of Food in the Last Year: Never true    Ran Out of Food in the Last Year: Never true  Transportation Needs: No Transportation Needs (04/17/2023)   PRAPARE - Administrator, Civil Service (Medical): No    Lack of Transportation (Non-Medical): No  Physical Activity: Inactive (04/17/2023)   Exercise Vital Sign    Days of Exercise per Week: 0 days    Minutes of Exercise per Session: 0 min  Stress: No Stress  Concern Present (04/17/2023)   [employer] of Occupational Health - Occupational Stress Questionnaire    Feeling of Stress : Not at all  Social Connections: Moderately Isolated (04/17/2023)   Social Connection and Isolation Panel    Frequency of Communication with Friends and Family: More than three times a week    Frequency of Social Gatherings with Friends and Family: Never    Attends Religious Services: More than 4 times per year    Active Member of [employer] or Organizations: No    Attends Banker Meetings: Never    Marital Status: Widowed  Intimate Partner Violence: Not At Risk (01/24/2022)   Humiliation, Afraid, Rape, and Kick questionnaire    Fear of Current or Ex-Partner: No    Emotionally Abused: No    Physically Abused: No    Sexually Abused: No    Review of Systems  Respiratory:  Positive for apnea.   Psychiatric/Behavioral:  Positive for sleep disturbance.     Vitals:   10/16/23 1446  BP: (!) 154/84  Pulse: 66  Temp: 98.7 F (37.1 C)  SpO2: 96%     Physical Exam Constitutional:      Appearance: She is obese.  HENT:     Head: Normocephalic.     Mouth/Throat:     Mouth: Mucous membranes are moist.  Eyes:     General: No scleral icterus. Cardiovascular:     Rate and Rhythm: Normal rate and regular rhythm.     Heart sounds: No murmur heard.    No friction rub.  Pulmonary:     Effort: No respiratory distress.     Breath sounds: No stridor. No wheezing or rhonchi.  Musculoskeletal:         General: Normal range of motion.     Cervical back: No rigidity or tenderness.  Neurological:     Mental Status: She is alert.  Psychiatric:        Mood and Affect: Mood normal.    Data Reviewed: CPAP compliance reviewed showing good compliance at 70% Average use of 4 hours 28 minutes AutoSet 5-15 95 percentile pressure of 11.3 Residual AHI of 0.1  Records were reviewed, previous visits with Dr. Shellia reviewed  Assessment:  Moderate obstructive sleep apnea - Continues to feel well with CPAP - We did talk about adjusting the pressure today - Will adjust the pressure from 5-15 to 8-12  Obesity - Encouraged to continue to try to stay active  History of heart failure with preserved ejection fraction - Continues to follow-up with cardiology   Plan/Recommendations: Continue using CPAP nightly  DME referral for pressure change  Encouraged to give us  a call with significant concerns  Follow-up in 6 months from here    Jennet Epley MD [facility] 10/16/2023, 3:24 PM  CC: Rollene Almarie LABOR, *

## 2023-10-16 NOTE — Patient Instructions (Signed)
 DME referral to adjust CPAP settings Change from CPAP of 5-15 to 8-12  Follow-up in about 3 months  Call us  with significant concerns

## 2023-10-22 NOTE — Progress Notes (Unsigned)
 April Donaldson [id number] October 30, 1931   Chief Complaint:  Referring Provider: Rollene Almarie LABOR, * Primary GI MD: Dr. Avram  HPI: April Donaldson is a 89 y.o. female with past medical history of anxiety, T2DM, CHF, difficult intubation, diverticulosis, GERD, hiatal hernia, colon polyps, HTN, hypothyroidism, IBS, internal hemorrhoids, obesity, OSA, CKD, hysterectomy, appendectomy who presents today for a complaint of abdominal pain and bloating.    Patient last seen in office 06/23/2021 by Dr. Avram for complaint of abdominal pain and constipation.  Advised to continue Benefiber and follow-up as needed.  No intervention for umbilical hernia recommended given age and lack of symptoms.   Previous GI Procedures/Imaging   Colonoscopy 2011     1) Two polyps in the proximal transverse colon - diminutive and     removed adenomas     2) Diverticula, scattered in the left colon     3) Stenosis in the sigmoid colon - thought due to adhesions     4) Internal hemorrhoids     5) Otherwise normal examination   Past Medical History:  Diagnosis Date   Anxiety    Benign neoplasm of kidney, except pelvis    Cellulitis of leg 03/09/2013 hosp    Cerebrovascular disease    Chronic low back pain    Diabetes mellitus without complication (HCC) 10/25/2021   Diastolic CHF (HCC)    Dr Victory Sharps   Difficult intubation    per patient with hand surgery in 2016 at surgical center- lip was pinched and swollen after surgery- patient states dr sissy never stated a problem with intubation    Diverticulosis    DJD (degenerative joint disease)    Dyspnea    with exertion    GERD (gastroesophageal reflux disease)    no meds   Glaucoma    Gout    H/O hiatal hernia    Hepatitis    hx of years ago    Hx of colonic polyps    Hypercholesteremia    Hypertension    Hypothyroid    IBS (irritable bowel syndrome)    Internal hemorrhoids    Obesity    OSA (obstructive sleep apnea)    NPSG 2009:  AHI 20/h. CPAP - follows with Clance   Renal insufficiency    one kidney small and non funtioning   Tubular adenoma 2000   Venous insufficiency     Past Surgical History:  Procedure Laterality Date   ABDOMINAL HYSTERECTOMY     APPENDECTOMY     CATARACT EXTRACTION, BILATERAL Bilateral 2017   cataracts Bilateral    COLONOSCOPY     CYSTOSCOPY/RETROGRADE/URETEROSCOPY Bilateral 04/09/2017   Procedure: CYSTOSCOPY/RETROGRADE;  Surgeon: Renda Glance, MD;  Location: [facility];  Service: Urology;  Laterality: Bilateral;  ONLY NEEDS 30 MIN FOR PROCEDURE   DILATION AND CURETTAGE OF UTERUS     FUNCTIONAL ENDOSCOPIC SINUS SURGERY  05/2003   Dr. Floy   hysterctomy and ooperectomy  1970's   MASS EXCISION  2003   lt   MASS EXCISION Left 09/17/2013   Procedure: EXCISION MASS LEFT HAND;  Surgeon: Donnice LABOR sissy, MD;  Location: [facility];  Service: Orthopedics;  Laterality: Left;   thyroid  lobectomy for a cyst  10/2000   Dr. Floy   TONSILLECTOMY      Current Outpatient Medications  Medication Sig Dispense Refill   allopurinol  (ZYLOPRIM ) 100 MG tablet TAKE 1 TABLET BY MOUTH EVERY DAY 90 tablet 2   aspirin  81 MG tablet Take 81  mg by mouth daily.     dorzolamide -timolol  (COSOPT ) 22.3-6.8 MG/ML ophthalmic solution Place 1 drop into both eyes 2 (two) times daily.     ezetimibe  (ZETIA ) 10 MG tablet Take 10 mg by mouth daily.     latanoprost  (XALATAN ) 0.005 % ophthalmic solution Place 1 drop into both eyes at bedtime.     levothyroxine  (SYNTHROID ) 50 MCG tablet TAKE 1 TABLET BY MOUTH DAILY BEFORE BREAKFAST 90 tablet 0   lisinopril  (ZESTRIL ) 10 MG tablet Take 1 tablet (10 mg total) by mouth in the morning. 90 tablet 3   lisinopril  (ZESTRIL ) 5 MG tablet Take 1 tablet (5 mg total) by mouth at bedtime. 90 tablet 3   nystatin  cream (MYCOSTATIN ) Apply to affected area 2 times daily (Patient not taking: Reported on 10/16/2023) 30 g 0   rosuvastatin  (CRESTOR ) 10 MG tablet Take 1 tablet (10  mg total) by mouth daily. 90 tablet 3   triamterene -hydrochlorothiazide  (DYAZIDE ) 37.5-25 MG capsule TAKE 1 CAPSULE BY MOUTH EVERY DAY 90 capsule 1   Wheat Dextrin (BENEFIBER DRINK MIX PO) Take by mouth daily.     No current facility-administered medications for this visit.    Allergies as of 10/23/2023 - Review Complete 10/16/2023  Allergen Reaction Noted   Amlodipine  besylate Swelling and Other (See Comments)    Clonidine Nausea Only and Other (See Comments) 03/27/2006   Diltiazem hcl Swelling and Other (See Comments)    Klonopin  [clonazepam ] Other (See Comments) 09/17/2013   Tetracycline Swelling 03/19/2015   Doxycycline hyclate Other (See Comments) 03/27/2006   Hydralazine Rash 03/19/2015   Iodine Other (See Comments)    Labetalol hcl Other (See Comments) 03/27/2006   Lidocaine  Rash 10/14/2013   Olmesartan medoxomil Other (See Comments) 08/24/2008    Family History  Problem Relation Age of Onset   Heart disease Mother    Diabetes Mother    Congestive Heart Failure Mother    Asthma Brother    Throat cancer Brother 31   Asthma Sister    Brain cancer Sister 95   Breast cancer Sister 39   Colon cancer Neg Hx    Esophageal cancer Neg Hx    Stomach cancer Neg Hx    Pancreatic cancer Neg Hx     Social History   Tobacco Use   Smoking status: Never   Smokeless tobacco: Never  Vaping Use   Vaping status: Never Used  Substance Use Topics   Alcohol use: No    Alcohol/week: 0.0 standard drinks of alcohol   Drug use: No     Review of Systems:    Constitutional: No weight loss, fever, chills, weakness or fatigue Eyes: No change in vision Ears, Nose, Throat:  No change in hearing or congestion Skin: No rash or itching Cardiovascular: No chest pain, chest pressure or palpitations   Respiratory: No SOB or cough Gastrointestinal: See HPI and otherwise negative Genitourinary: No dysuria or change in urinary frequency Neurological: No headache, dizziness or  syncope Musculoskeletal: No new muscle or joint pain Hematologic: No bleeding or bruising    Physical Exam:  Vital signs: There were no vitals taken for this visit.  Constitutional: NAD, Well developed, Well nourished, alert and cooperative Head:  Normocephalic and atraumatic.  Eyes: No scleral icterus. Conjunctiva pink. Mouth: No oral lesions. Respiratory: Respirations even and unlabored. Lungs clear to auscultation bilaterally.  No wheezes, crackles, or rhonchi.  Cardiovascular:  Regular rate and rhythm. No murmurs. No peripheral edema. Gastrointestinal:  Soft, nondistended, nontender. No rebound or guarding.  Normal bowel sounds. No appreciable masses or hepatomegaly. Rectal:  Not performed.  Neurologic:  Alert and oriented x4;  grossly normal neurologically.  Skin:   Dry and intact without significant lesions or rashes. Psychiatric: Oriented to person, place and time. Demonstrates good judgement and reason without abnormal affect or behaviors.   RELEVANT LABS AND IMAGING: CBC    Component Value Date/Time   WBC 9.4 05/18/2023 1204   RBC 4.31 05/18/2023 1204   HGB 13.0 05/18/2023 1204   HCT 39.3 05/18/2023 1204   PLT 332.0 05/18/2023 1204   MCV 91.2 05/18/2023 1204   MCH 29.1 12/28/2019 1705   MCHC 33.1 05/18/2023 1204   RDW 14.4 05/18/2023 1204   LYMPHSABS 3.6 05/18/2023 1204   MONOABS 1.0 05/18/2023 1204   EOSABS 0.1 05/18/2023 1204   BASOSABS 0.1 05/18/2023 1204    CMP     Component Value Date/Time   NA 141 09/13/2023 1015   K 4.3 09/13/2023 1015   CL 104 09/13/2023 1015   CO2 22 09/13/2023 1015   GLUCOSE 81 09/13/2023 1015   GLUCOSE 89 05/18/2023 1204   BUN 26 09/13/2023 1015   CREATININE 1.36 (H) 09/13/2023 1015   CALCIUM  9.3 09/13/2023 1015   CALCIUM  9.5 03/23/2023 0000   PROT 7.1 09/13/2023 1015   ALBUMIN 4.3 09/13/2023 1015   AST 18 09/13/2023 1015   ALT 12 09/13/2023 1015   ALKPHOS 71 09/13/2023 1015   BILITOT 0.6 09/13/2023 1015   GFRNONAA 47 (L)  12/28/2019 1705   GFRAA 52 (L) 04/20/2019 1032   Echocardiogram 12/05/2022 1. Left ventricular ejection fraction, by estimation, is 55 to 60% . The left ventricle has normal function. The left ventricle has no regional wall motion abnormalities. There is mild concentric left ventricular hypertrophy. Left ventricular diastolic parameters are consistent with Grade II diastolic dysfunction ( pseudonormalization) .  2. Right ventricular systolic function is normal. The right ventricular size is normal. There is normal pulmonary artery systolic pressure.  3. Left atrial size was mildly dilated.  4. Right atrial size was mildly dilated.  5. The mitral valve is normal in structure. Mild mitral valve regurgitation. No evidence of mitral stenosis.  6. The aortic valve is tricuspid. There is mild calcification of the aortic valve. Aortic valve regurgitation is not visualized. Aortic valve sclerosis/ calcification is present, without any evidence of aortic stenosis.  7. The inferior vena cava is normal in size with greater than 50% respiratory variability, suggesting right atrial pressure of 3 mmHg.  Assessment/Plan:       Camie Furbish, PA-C [facility] 10/22/2023, 7:45 PM  Patient Care Team: Rollene Almarie LABOR, MD as PCP - General (Internal Medicine) Santo Stanly LABOR, MD as PCP - Cardiology (Cardiology) Clance, Francis HERO, MD (Sleep Medicine) Delford Maude BROCKS, MD (Cardiology) Avram Lupita BRAVO, MD (Gastroenterology) Douglass Lunger, MD (Nephrology) Renda Glance, MD (Urology) Floy Lynwood PARAS, MD (Inactive) (Otolaryngology) Roz Anes, MD (Ophthalmology) Stuart Almarie, NP (Obstetrics and Gynecology) Geneva, Reyes Mcardle, MD (Ophthalmology) Sissy Cough, MD (Orthopedic Surgery) Baldwin Alm SAUNDERS, DC as Referring Physician (Chiropractic Medicine)

## 2023-10-23 ENCOUNTER — Encounter: Payer: Self-pay | Admitting: Gastroenterology

## 2023-10-23 ENCOUNTER — Ambulatory Visit (INDEPENDENT_AMBULATORY_CARE_PROVIDER_SITE_OTHER): Admitting: Gastroenterology

## 2023-10-23 VITALS — BP 146/84 | HR 59 | Ht <= 58 in | Wt 162.1 lb

## 2023-10-23 DIAGNOSIS — R1010 Upper abdominal pain, unspecified: Secondary | ICD-10-CM

## 2023-10-23 DIAGNOSIS — K429 Umbilical hernia without obstruction or gangrene: Secondary | ICD-10-CM | POA: Diagnosis not present

## 2023-10-23 NOTE — Patient Instructions (Signed)
 Follow up with PCP.   Continue with daily benefiber.   _______________________________________________________  If your blood pressure at your visit was 140/90 or greater, please contact your primary care physician to follow up on this.  _______________________________________________________  If you are age 88 or older, your body mass index should be between 23-30. Your Body mass index is 34.48 kg/m. If this is out of the aforementioned range listed, please consider follow up with your Primary Care Provider.  If you are age 17 or younger, your body mass index should be between 19-25. Your Body mass index is 34.48 kg/m. If this is out of the aformentioned range listed, please consider follow up with your Primary Care Provider.   ________________________________________________________  The [facility] providers would like to encourage you to use MYCHART to communicate with providers for non-urgent requests or questions.  Due to long hold times on the telephone, sending your provider a message by [facility] may be a faster and more efficient way to get a response.  Please allow 48 business hours for a response.  Please remember that this is for non-urgent requests.  _______________________________________________________  [facility] is using a team-based approach to care.  Your team is made up of your doctor and two to three APPS. Our APPS (Nurse Practitioners and Physician Assistants) work with your physician to ensure care continuity for you. They are fully qualified to address your health concerns and develop a treatment plan. They communicate directly with your gastroenterologist to care for you. Seeing the Advanced Practice Practitioners on your physician's team can help you by facilitating care more promptly, often allowing for earlier appointments, access to diagnostic testing, procedures, and other specialty referrals.

## 2023-11-08 ENCOUNTER — Other Ambulatory Visit: Payer: Self-pay | Admitting: Internal Medicine

## 2023-11-18 LAB — LAB REPORT - SCANNED
Albumin, Urine POC: 45.6
Creatinine, POC: 65.6 mg/dL
EGFR: 33
Microalb Creat Ratio: 70

## 2023-12-06 ENCOUNTER — Telehealth: Payer: Self-pay | Admitting: Pulmonary Disease

## 2023-12-06 NOTE — Telephone Encounter (Signed)
 Copied from CRM [id number]. Topic: Clinical - Order For Equipment >> Dec 06, 2023 12:05 PM Celestine FALCON wrote: Reason for CRM: Pt is calling back due to not hearing from DME company regarding her cpap settings needing to be changed per Dr. Neda. Pt was seen on 10/16/2023 with Dr. Neda which determined her CPAP setting needing to be changed from CPAP of 5-15 to 8-12. There was to be a referral to be placed, but I do not see one in the pt's chart. Pt has a follow up appt scheduled for 01/11/2024 with Dr. Neda to review the changes with her CPAP, but she feels she may need to reschedule it. Pt uses [employer] for CPAP supplies.   Please call to confirm with the pt at [phone] ok to leave a vm.

## 2023-12-07 ENCOUNTER — Other Ambulatory Visit: Payer: Self-pay

## 2023-12-07 DIAGNOSIS — G4733 Obstructive sleep apnea (adult) (pediatric): Secondary | ICD-10-CM

## 2023-12-07 NOTE — Telephone Encounter (Signed)
 Please place order to decrease CPAP settings.  Thank you.

## 2023-12-07 NOTE — Telephone Encounter (Signed)
 Called patient.  Patient was seen on 10/16/2023.  No order was placed to adjust the settings.  Placed orders only and send message to [facility] pool to notify of this order.  Patient was informed.

## 2023-12-10 NOTE — Telephone Encounter (Signed)
Order was sent this morning

## 2023-12-28 ENCOUNTER — Telehealth: Payer: Self-pay | Admitting: Internal Medicine

## 2023-12-28 DIAGNOSIS — I10 Essential (primary) hypertension: Secondary | ICD-10-CM

## 2023-12-28 MED ORDER — LISINOPRIL 10 MG PO TABS: 10.0000 mg | ORAL_TABLET | Freq: Two times a day (BID) | ORAL | Status: AC

## 2023-12-28 NOTE — Telephone Encounter (Deleted)
 Spoke with pt, aware of recommendations. SABRAlom

## 2023-12-28 NOTE — Telephone Encounter (Signed)
 10/27 - 131/81  HR 56 10/28 - 162/87  HR  57 10/29 - 131/81  HR  58 10/30 - 135/80  HR 60 10/31 am - 201/111  HR 78- This morning, she took bp at 0845  She took her medication- Lisinopril  10 mg and Dyazide  37.5-25 mg around 9 am. S/w pt and now BP is 167/94 hr 60= about an hour and a half after taking medication.   Instructed the patient to check her bp before meds and 2 hours after taking medication.   She reports a racing heart beat last night when laying down. She could hear it swooshing more in certain positions than others.  Asked if she ate anything that had increased sodium yesterday or last night. She reports that she ate an egg salad Sandwich last night before bed.   No headache, no blurred vision, no shortness of breath, no swelling. She reports having a cramp in her right foot, then it went up to her thigh. She rubbed it and walked around and it went away, she has not had a cramp in a very long time.   Informed her that I would send this information to her provider, then call her back with recommendations. She verbalized understanding.

## 2023-12-28 NOTE — Telephone Encounter (Signed)
Spoke with pt, aware of the recommendations. Lab orders mailed to the pt

## 2023-12-28 NOTE — Telephone Encounter (Signed)
 Pt c/o BP issue: STAT if pt c/o blurred vision, one-sided weakness or slurred speech.  STAT if BP is GREATER than 180/120 TODAY.  STAT if BP is LESS than 90/60 and SYMPTOMATIC TODAY  1. What is your BP concern?   See below  2. Have you taken any BP medication today?  Yes  3. What are your last 5 BP readings?  10/27 - 131/81  HR 56 10/28 - 162/87  HR  57 10/29 - 131/81  HR  58 10/30 - 135/80  HR 60 10/31 am - 201/111  HR 78  4. Are you having any other symptoms (ex. Dizziness, headache, blurred vision, passed out)?   Just a little racing heart beats   Patient stated her BP has been fluctuating and she has been having racing heart beats.

## 2023-12-28 NOTE — Telephone Encounter (Signed)
Patient called to follow up on next steps.

## 2024-01-11 ENCOUNTER — Encounter: Payer: Self-pay | Admitting: Pulmonary Disease

## 2024-01-11 ENCOUNTER — Ambulatory Visit: Admitting: Pulmonary Disease

## 2024-01-11 VITALS — BP 161/77 | HR 61 | Ht <= 58 in | Wt 164.0 lb

## 2024-01-11 DIAGNOSIS — G4733 Obstructive sleep apnea (adult) (pediatric): Secondary | ICD-10-CM

## 2024-01-11 NOTE — Progress Notes (Signed)
 April Donaldson    [id number]    [birth year]  Primary Care Physician:Crawford, Almarie LABOR, MD  Referring Physician: Rollene Almarie LABOR, MD [address] [place],  [place] [address]  Chief complaint:   Follow-up for obstructive sleep apnea  HPI:  Patient with moderate obstructive sleep apnea, diagnosed about 2009 Continues to use CPAP  Currently on auto CPAP 5-15 Received her machine about a year ago  Denies any significant concerns with her breathing today Has been doing relatively well Was having some bloating at the last visit, pressure change is definitely helped  Continues to be compliant with CPAP on a nightly basis  Does have a history of hypothyroidism, hypertension, hyperlipidemia, glaucoma, gout GERD, CVA, chronic back pain  Never smoker  Also been following up with cardiology   Outpatient Encounter Medications as of 01/11/2024  Medication Sig   allopurinol  (ZYLOPRIM ) 100 MG tablet TAKE 1 TABLET BY MOUTH EVERY DAY   aspirin  81 MG tablet Take 81 mg by mouth daily.   dorzolamide -timolol  (COSOPT ) 22.3-6.8 MG/ML ophthalmic solution Place 1 drop into both eyes 2 (two) times daily.   ezetimibe  (ZETIA ) 10 MG tablet Take 10 mg by mouth daily.   latanoprost  (XALATAN ) 0.005 % ophthalmic solution Place 1 drop into both eyes at bedtime.   levothyroxine  (SYNTHROID ) 50 MCG tablet TAKE 1 TABLET BY MOUTH DAILY BEFORE BREAKFAST   lisinopril  (ZESTRIL ) 10 MG tablet Take 1 tablet (10 mg total) by mouth 2 (two) times daily.   nystatin  cream (MYCOSTATIN ) Apply to affected area 2 times daily   rosuvastatin  (CRESTOR ) 10 MG tablet Take 1 tablet (10 mg total) by mouth daily.   triamterene -hydrochlorothiazide  (DYAZIDE ) 37.5-25 MG capsule TAKE 1 CAPSULE BY MOUTH EVERY DAY   Wheat Dextrin (BENEFIBER DRINK MIX PO) Take by mouth daily.   No facility-administered encounter medications on file as of 01/11/2024.    Allergies as of 01/11/2024 - Review Complete 10/23/2023   Allergen Reaction Noted   Iodinated contrast media Other (See Comments) 10/23/2023   Amlodipine  besylate Swelling and Other (See Comments)    Clonidine Nausea Only and Other (See Comments) 03/27/2006   Diltiazem hcl Swelling and Other (See Comments)    Klonopin  [clonazepam ] Other (See Comments) 09/17/2013   Tetracycline Swelling 03/19/2015   Doxycycline hyclate Other (See Comments) 03/27/2006   Hydralazine Rash 03/19/2015   Iodine Other (See Comments)    Labetalol hcl Other (See Comments) 03/27/2006   Lidocaine  Rash 10/14/2013   Olmesartan medoxomil Other (See Comments) 08/24/2008    Past Medical History:  Diagnosis Date   Anxiety    Benign neoplasm of kidney, except pelvis    Cellulitis of leg 03/09/2013 hosp    Cerebrovascular disease    Chronic low back pain    Diabetes mellitus without complication (HCC) 10/25/2021   Diastolic CHF (HCC)    Dr Victory Sharps   Difficult intubation    per patient with hand surgery in 2016 at surgical center- lip was pinched and swollen after surgery- patient states dr sissy never stated a problem with intubation    Diverticulosis    DJD (degenerative joint disease)    Dyspnea    with exertion    GERD (gastroesophageal reflux disease)    no meds   Glaucoma    Gout    H/O hiatal hernia    Hepatitis    hx of years ago    Hx of colonic polyps    Hypercholesteremia  Hypertension    Hypothyroid    IBS (irritable bowel syndrome)    Internal hemorrhoids    Obesity    OSA (obstructive sleep apnea)    NPSG 2009: AHI 20/h. CPAP - follows with [facility]   Renal insufficiency    one kidney small and non funtioning   Tubular adenoma 2000   Venous insufficiency     Past Surgical History:  Procedure Laterality Date   ABDOMINAL HYSTERECTOMY     APPENDECTOMY     CATARACT EXTRACTION, BILATERAL Bilateral 2017   cataracts Bilateral    COLONOSCOPY     CYSTOSCOPY/RETROGRADE/URETEROSCOPY Bilateral 04/09/2017   Procedure: CYSTOSCOPY/RETROGRADE;   Surgeon: Renda Glance, MD;  Location: [facility];  Service: Urology;  Laterality: Bilateral;  ONLY NEEDS 30 MIN FOR PROCEDURE   DILATION AND CURETTAGE OF UTERUS     FUNCTIONAL ENDOSCOPIC SINUS SURGERY  05/2003   Dr. Floy   hysterctomy and ooperectomy  1970's   MASS EXCISION  2003   lt   MASS EXCISION Left 09/17/2013   Procedure: EXCISION MASS LEFT HAND;  Surgeon: Donnice DELENA Robinsons, MD;  Location: [facility];  Service: Orthopedics;  Laterality: Left;   thyroid  lobectomy for a cyst  10/2000   Dr. Floy   TONSILLECTOMY      Family History  Problem Relation Age of Onset   Heart disease Mother    Diabetes Mother    Congestive Heart Failure Mother    Asthma Brother    Throat cancer Brother 53   Asthma Sister    Brain cancer Sister 75   Breast cancer Sister 44   Colon cancer Neg Hx    Esophageal cancer Neg Hx    Stomach cancer Neg Hx    Pancreatic cancer Neg Hx     Social History   Socioeconomic History   Marital status: Single    Spouse name: Not on file   Number of children: 1   Years of education: Not on file   Highest education level: Not on file  Occupational History   Occupation: retired from worked in [employer], Hospital: RETIRED  Tobacco Use   Smoking status: Never   Smokeless tobacco: Never  Vaping Use   Vaping status: Never Used  Substance and Sexual Activity   Alcohol use: No    Alcohol/week: 0.0 standard drinks of alcohol   Drug use: No   Sexual activity: Not Currently  Other Topics Concern   Not on file  Social History Narrative   Lives alone, indep - never married   g-dtr in [place], siblings, nieces in town   Keeps in touch with a few friends who talk regularly   Social Drivers of Health   Financial Resource Strain: Low Risk  (04/17/2023)   Overall Financial Resource Strain (CARDIA)    Difficulty of Paying Living Expenses: Not hard at all  Food Insecurity: No Food Insecurity (04/17/2023)   Hunger Vital Sign    Worried About  Running Out of Food in the Last Year: Never true    Ran Out of Food in the Last Year: Never true  Transportation Needs: No Transportation Needs (04/17/2023)   PRAPARE - Administrator, Civil Service (Medical): No    Lack of Transportation (Non-Medical): No  Physical Activity: Inactive (04/17/2023)   Exercise Vital Sign    Days of Exercise per Week: 0 days    Minutes of Exercise per Session: 0 min  Stress: No Stress Concern Present (04/17/2023)  [employer] of Occupational Health - Occupational Stress Questionnaire    Feeling of Stress : Not at all  Social Connections: Moderately Isolated (04/17/2023)   Social Connection and Isolation Panel    Frequency of Communication with Friends and Family: More than three times a week    Frequency of Social Gatherings with Friends and Family: Never    Attends Religious Services: More than 4 times per year    Active Member of [employer] or Organizations: No    Attends Banker Meetings: Never    Marital Status: Widowed  Intimate Partner Violence: Not At Risk (01/24/2022)   Humiliation, Afraid, Rape, and Kick questionnaire    Fear of Current or Ex-Partner: No    Emotionally Abused: No    Physically Abused: No    Sexually Abused: No    Review of Systems  Respiratory:  Positive for apnea.   Psychiatric/Behavioral:  Positive for sleep disturbance.     There were no vitals filed for this visit.    Physical Exam Constitutional:      Appearance: She is obese.  HENT:     Head: Normocephalic.     Nose: No congestion.     Mouth/Throat:     Mouth: Mucous membranes are moist.  Eyes:     General: No scleral icterus. Cardiovascular:     Rate and Rhythm: Normal rate and regular rhythm.     Heart sounds: No murmur heard.    No friction rub.  Pulmonary:     Effort: No respiratory distress.     Breath sounds: No stridor. No wheezing or rhonchi.  Musculoskeletal:        General: Normal range of motion.     Cervical back:  No rigidity or tenderness.  Neurological:     Mental Status: She is alert.  Psychiatric:        Mood and Affect: Mood normal.    Data Reviewed:  Compliance data reviewed showing 73% compliance, average use of 5 hours 29 minutes AutoSet 8-12 Residual AHI of 0.2,  95 percentile pressure of 11.5  Assessment:   Moderate obstructive sleep apnea - Continues to tolerate CPAP well - Pressure change to 8-12 has been well-tolerated and positive  Obesity-class II - Encouraged to continue to stay active  History of heart failure with preserved ejection fraction - Continue to follow with cardiology - Stable  Plan/Recommendations: Continue CPAP on a nightly basis  Encouraged to give us  a call with any significant concerns  Follow-up 6 months from here    Jennet Epley MD [facility] 01/11/2024, 10:54 AM  CC: Rollene Almarie LABOR, *

## 2024-01-11 NOTE — Patient Instructions (Signed)
 Will see you back in about 6 months  Your next appointment can be changed to 6 months from here  Continue using CPAP on a nightly basis Download from your machine shows it is working well  You can try a mouth tape-kinesiology tapes can be cut into size to fit, if this does not work then using a chinstrap  Call us  with significant concerns

## 2024-01-12 LAB — BASIC METABOLIC PANEL WITH GFR
BUN/Creatinine Ratio: 20 (ref 12–28)
BUN: 30 mg/dL — ABNORMAL HIGH (ref 8–27)
CO2: 23 mmol/L (ref 20–29)
Calcium: 9.3 mg/dL (ref 8.7–10.3)
Chloride: 104 mmol/L (ref 96–106)
Creatinine, Ser: 1.47 mg/dL — ABNORMAL HIGH (ref 0.57–1.00)
Glucose: 87 mg/dL (ref 70–99)
Potassium: 4.3 mmol/L (ref 3.5–5.2)
Sodium: 141 mmol/L (ref 134–144)
eGFR: 34 mL/min/1.73 — ABNORMAL LOW (ref >59)

## 2024-01-23 ENCOUNTER — Ambulatory Visit: Payer: Self-pay | Admitting: *Deleted

## 2024-01-25 ENCOUNTER — Encounter (HOSPITAL_COMMUNITY): Payer: Self-pay | Admitting: Family Medicine

## 2024-01-25 ENCOUNTER — Observation Stay (HOSPITAL_COMMUNITY): Admission: EM | Admit: 2024-01-25 | Discharge: 2024-01-27 | Disposition: A

## 2024-01-25 ENCOUNTER — Other Ambulatory Visit: Payer: Self-pay

## 2024-01-25 ENCOUNTER — Emergency Department (HOSPITAL_COMMUNITY)

## 2024-01-25 DIAGNOSIS — I5030 Unspecified diastolic (congestive) heart failure: Secondary | ICD-10-CM | POA: Diagnosis not present

## 2024-01-25 DIAGNOSIS — R079 Chest pain, unspecified: Secondary | ICD-10-CM | POA: Diagnosis present

## 2024-01-25 DIAGNOSIS — E785 Hyperlipidemia, unspecified: Secondary | ICD-10-CM | POA: Diagnosis not present

## 2024-01-25 DIAGNOSIS — G4733 Obstructive sleep apnea (adult) (pediatric): Secondary | ICD-10-CM | POA: Insufficient documentation

## 2024-01-25 DIAGNOSIS — N184 Chronic kidney disease, stage 4 (severe): Secondary | ICD-10-CM | POA: Diagnosis not present

## 2024-01-25 DIAGNOSIS — E1122 Type 2 diabetes mellitus with diabetic chronic kidney disease: Secondary | ICD-10-CM | POA: Diagnosis not present

## 2024-01-25 DIAGNOSIS — R7989 Other specified abnormal findings of blood chemistry: Secondary | ICD-10-CM

## 2024-01-25 DIAGNOSIS — Z79899 Other long term (current) drug therapy: Secondary | ICD-10-CM | POA: Insufficient documentation

## 2024-01-25 DIAGNOSIS — I214 Non-ST elevation (NSTEMI) myocardial infarction: Secondary | ICD-10-CM | POA: Diagnosis not present

## 2024-01-25 DIAGNOSIS — Z7982 Long term (current) use of aspirin: Secondary | ICD-10-CM | POA: Insufficient documentation

## 2024-01-25 DIAGNOSIS — R002 Palpitations: Secondary | ICD-10-CM

## 2024-01-25 DIAGNOSIS — I130 Hypertensive heart and chronic kidney disease with heart failure and stage 1 through stage 4 chronic kidney disease, or unspecified chronic kidney disease: Secondary | ICD-10-CM | POA: Insufficient documentation

## 2024-01-25 DIAGNOSIS — I160 Hypertensive urgency: Secondary | ICD-10-CM

## 2024-01-25 DIAGNOSIS — I10 Essential (primary) hypertension: Secondary | ICD-10-CM | POA: Diagnosis present

## 2024-01-25 DIAGNOSIS — I471 Supraventricular tachycardia, unspecified: Secondary | ICD-10-CM

## 2024-01-25 DIAGNOSIS — E66811 Obesity, class 1: Secondary | ICD-10-CM

## 2024-01-25 DIAGNOSIS — E039 Hypothyroidism, unspecified: Secondary | ICD-10-CM | POA: Diagnosis present

## 2024-01-25 LAB — BASIC METABOLIC PANEL WITH GFR
Anion gap: 12 (ref 5–15)
BUN: 31 mg/dL — ABNORMAL HIGH (ref 8–23)
CO2: 21 mmol/L — ABNORMAL LOW (ref 22–32)
Calcium: 8.7 mg/dL — ABNORMAL LOW (ref 8.9–10.3)
Chloride: 104 mmol/L (ref 98–111)
Creatinine, Ser: 1.52 mg/dL — ABNORMAL HIGH (ref 0.44–1.00)
GFR, Estimated: 32 mL/min — ABNORMAL LOW (ref >60)
Glucose, Bld: 115 mg/dL — ABNORMAL HIGH (ref 70–99)
Potassium: 4 mmol/L (ref 3.5–5.1)
Sodium: 137 mmol/L (ref 135–145)

## 2024-01-25 LAB — CBC
HCT: 38.4 % (ref 36.0–46.0)
Hemoglobin: 12.5 g/dL (ref 12.0–15.0)
MCH: 29.6 pg (ref 26.0–34.0)
MCHC: 32.6 g/dL (ref 30.0–36.0)
MCV: 91 fL (ref 80.0–100.0)
Platelets: 303 K/uL (ref 150–400)
RBC: 4.22 MIL/uL (ref 3.87–5.11)
RDW: 13 % (ref 11.5–15.5)
WBC: 9.2 K/uL (ref 4.0–10.5)
nRBC: 0 % (ref 0.0–0.2)

## 2024-01-25 LAB — PROTIME-INR
INR: 0.9 (ref 0.8–1.2)
Prothrombin Time: 13.2 s (ref 11.4–15.2)

## 2024-01-25 LAB — TROPONIN I (HIGH SENSITIVITY)
Troponin I (High Sensitivity): 25 ng/L — ABNORMAL HIGH (ref <18)
Troponin I (High Sensitivity): 225 ng/L (ref <18)

## 2024-01-25 NOTE — ED Triage Notes (Signed)
 Patient feels like her heart is skipping and she feels like its going fast. Patient reports chest pain that she thinks is muscle pain. Intermittent shob.   Denies a-fib or flutter

## 2024-01-25 NOTE — ED Provider Notes (Signed)
 [place] EMERGENCY DEPARTMENT AT [facility] Provider Note   CSN: [id number] Arrival date & time: 01/25/24  1945     Patient presents with: Chest Pain and Palpitations   April Donaldson is a 89 y.o. female.   Patient to ED for evaluation of episodes of heart pounding type palpitations that have occurred today off and on. She states she might have felt mildly SOB but denies persistent or significant SOB/DOE. No cough, fever. She reports having a tight feeling to the left chest that was worse if she pushed on that area. No symptoms of palpitations, chest tightness, or SOB currently. No nausea, vomiting at any time.   The history is provided by the patient. No language interpreter was used.  Chest Pain Associated symptoms: palpitations   Palpitations Associated symptoms: chest pain        Prior to Admission medications   Medication Sig Start Date End Date Taking? Authorizing Provider  allopurinol  (ZYLOPRIM ) 100 MG tablet TAKE 1 TABLET BY MOUTH EVERY DAY 05/14/23   Rollene Almarie LABOR, MD  aspirin  81 MG tablet Take 81 mg by mouth daily.    [provider]  dorzolamide -timolol  (COSOPT ) 22.3-6.8 MG/ML ophthalmic solution Place 1 drop into both eyes 2 (two) times daily.    [provider]  ezetimibe  (ZETIA ) 10 MG tablet Take 10 mg by mouth daily.    [provider]  latanoprost  (XALATAN ) 0.005 % ophthalmic solution Place 1 drop into both eyes at bedtime.    [provider]  levothyroxine  (SYNTHROID ) 50 MCG tablet TAKE 1 TABLET BY MOUTH DAILY BEFORE BREAKFAST 11/08/23   Rollene Almarie LABOR, MD  lisinopril  (ZESTRIL ) 10 MG tablet Take 1 tablet (10 mg total) by mouth 2 (two) times daily. 12/28/23   Santo Stanly LABOR, MD  nystatin  cream (MYCOSTATIN ) Apply to affected area 2 times daily 04/16/23   Rising, Asberry, PA-C  rosuvastatin  (CRESTOR ) 10 MG tablet Take 1 tablet (10 mg total) by mouth daily. 07/25/23 01/11/24  Santo Stanly LABOR,  MD  triamterene -hydrochlorothiazide  (DYAZIDE ) 37.5-25 MG capsule TAKE 1 CAPSULE BY MOUTH EVERY DAY 08/15/23   Rollene Almarie LABOR, MD  Wheat Dextrin (BENEFIBER DRINK MIX PO) Take by mouth daily.    [provider]    Allergies: Iodinated contrast media, Amlodipine  besylate, Clonidine, Diltiazem hcl, Klonopin  [clonazepam ], Tetracycline, Doxycycline hyclate, Hydralazine, Iodine, Labetalol hcl, Lidocaine , and Olmesartan medoxomil    Review of Systems  Cardiovascular:  Positive for chest pain and palpitations.    Updated Vital Signs BP (!) 174/96   Pulse (!) 56   Temp 97.8 F (36.6 C)   Resp 17   SpO2 100%   Physical Exam Constitutional:      Appearance: She is well-developed.  HENT:     Head: Normocephalic.  Neck:     Vascular: No carotid bruit.  Cardiovascular:     Rate and Rhythm: Normal rate and regular rhythm.     Heart sounds: No murmur heard. Pulmonary:     Effort: Pulmonary effort is normal.     Breath sounds: Normal breath sounds. No wheezing, rhonchi or rales.  Chest:       Comments: Minimally tender to left chest.  Abdominal:     General: Bowel sounds are normal.     Palpations: Abdomen is soft.     Tenderness: There is no abdominal tenderness. There is no guarding or rebound.  Musculoskeletal:        General: Normal range of motion.  Cervical back: Normal range of motion and neck supple.     Right lower leg: No edema.     Left lower leg: No edema.  Skin:    General: Skin is warm and dry.  Neurological:     General: No focal deficit present.     Mental Status: She is alert and oriented to person, place, and time.     (all labs ordered are listed, but only abnormal results are displayed) Labs Reviewed  BASIC METABOLIC PANEL WITH GFR - Abnormal; Notable for the following components:      Result Value   CO2 21 (*)    Glucose, Bld 115 (*)    BUN 31 (*)    Creatinine, Ser 1.52 (*)    Calcium  8.7 (*)    GFR, Estimated 32 (*)    All other  components within normal limits  TROPONIN I (HIGH SENSITIVITY) - Abnormal; Notable for the following components:   Troponin I (High Sensitivity) 25 (*)    All other components within normal limits  TROPONIN I (HIGH SENSITIVITY) - Abnormal; Notable for the following components:   Troponin I (High Sensitivity) 225 (*)    All other components within normal limits  CBC  PROTIME-INR    EKG: EKG Interpretation Date/Time:  Friday January 25 2024 19:59:11 EST Ventricular Rate:  59 PR Interval:  142 QRS Duration:  84 QT Interval:  420 QTC Calculation: 415 R Axis:   -9  Text Interpretation: Sinus bradycardia Cannot rule out Anterior infarct , age undetermined Abnormal ECG When compared with ECG of 15-Nov-2022 13:25, PREVIOUS ECG IS PRESENT Confirmed by Simon Rea [phone]) on 01/25/2024 8:56:29 PM  Radiology: DG Chest 2 View Result Date: 01/25/2024 EXAM: 2 VIEW(S) XRAY OF THE CHEST 01/25/2024 08:27:12 PM COMPARISON: 04/16/2023 CLINICAL HISTORY: chest pain FINDINGS: LUNGS AND PLEURA: No focal pulmonary opacity. No pleural effusion. No pneumothorax. HEART AND MEDIASTINUM: Tortuous aorta with atherosclerosis. No acute abnormality of the cardiac silhouette. BONES AND SOFT TISSUES: Multilevel thoracic osteophytosis. IMPRESSION: 1. No acute cardiopulmonary process. Electronically signed by: Norman Gatlin MD 01/25/2024 08:37 PM EST RP Workstation: [id number]     .Critical Care  Performed by: Odell Balls, PA-C Authorized by: Odell Balls, PA-C   Critical care provider statement:    Critical care time (minutes):  45   Critical care was necessary to treat or prevent imminent or life-threatening deterioration of the following conditions:  Cardiac failure   Critical care was time spent personally by me on the following activities:  Development of treatment plan with patient or surrogate, discussions with consultants, examination of patient, ordering and review of laboratory studies, ordering  and review of radiographic studies, pulse oximetry, re-evaluation of patient's condition and review of old charts    Medications Ordered in the ED - No data to display  Clinical Course as of 01/25/24 2337  Fri Jan 25, 2024  2256 Patient with history of dCHF, HTN, HLD, CKD, hypothyroid, presents with palpitations, mild SOB, reproducible chest wall tenderness (mild). Sxs today, on and off. No peripheral edema, CXR clear. Not c/w CHF exacerbation. No infiltrates, fever, cough - doubt PNA.  EKG: EKG Interpretation Date/Time:  Friday January 25 2024 19:59:11 EST Ventricular Rate:  59 PR Interval:  142 QRS Duration:  84 QT Interval:  420 QTC Calculation: 415 R Axis:   -9  Text Interpretation: Sinus bradycardia Cannot rule out Anterior infarct , age undetermined Abnormal ECG When compared with ECG of 15-Nov-2022 13:25, PREVIOUS ECG IS PRESENT Confirmed by  Simon Rea [phone]) on 01/25/2024 8:56:29 PM  Troponin minimally elevated at 25. Pt with CKD. Delta troponin pending. Patient presentation discussed with Dr. Simon. If troponin flat, feel she is appropriate for discharge home to outpatient follow up. [SU]  2330 Recheck: she has been asymptomatic since arrival. BP slightly improved. No hypoxia. Delta troponin elevated to 225 c/w NSTEMI. Cardiology paged for consult, hospitalist to admit. Patient updated and agreeable to plan of care.  [SU]    Clinical Course User Index [SU] Odell Balls, PA-C                                 Medical Decision Making Amount and/or Complexity of Data Reviewed Labs: ordered. Radiology: ordered.        Final diagnoses:  NSTEMI (non-ST elevated myocardial infarction) [facility])    ED Discharge Orders     None          Odell Balls, PA-C 01/25/24 2338    Simon Rea SAILOR, MD 01/26/24 2231

## 2024-01-25 NOTE — H&P (Incomplete)
 History and Physical    April Donaldson FMW:[id number] DOB: 1931/10/01 DOA: 01/25/2024  Referring MD/NP/PA: *** PCP: Rollene Almarie LABOR, MD  Outpatient Specialists:  Pulmonology-Adawele Neda, MD @[facility]  Ophthalmology-Jeffrey Thresa Bracket, MD @[facility]  [facility] Johnell Furbish, PA-C@[facility]   Patient coming from: ***  Chief Complaint: heart palpitations  HPI: April Donaldson is a 89 y.o. female with medical history significant of HTN, CHF, T2DM, HLD, hypothyroidism, OSA on CPAP, open angle glaucoma  ED Course: ***  Review of Systems: As per HPI otherwise 10 point review of systems negative.   Past Medical History:  Diagnosis Date  . Anxiety   . Benign neoplasm of kidney, except pelvis   . Cellulitis of leg 03/09/2013 hosp   . Cerebrovascular disease   . Chronic low back pain   . Diabetes mellitus without complication (HCC) 10/25/2021  . Diastolic CHF (HCC)    Dr Victory Sharps  . Difficult intubation    per patient with hand surgery in 2016 at surgical center- lip was pinched and swollen after surgery- patient states dr sissy never stated a problem with intubation   . Diverticulosis   . DJD (degenerative joint disease)   . Dyspnea    with exertion   . GERD (gastroesophageal reflux disease)    no meds  . Glaucoma   . Gout   . H/O hiatal hernia   . Hepatitis    hx of years ago   . Hx of colonic polyps   . Hypercholesteremia   . Hypertension   . Hypothyroid   . IBS (irritable bowel syndrome)   . Internal hemorrhoids   . Obesity   . OSA (obstructive sleep apnea)    NPSG 2009: AHI 20/h. CPAP - follows with [facility]  . Renal insufficiency    one kidney small and non funtioning  . Tubular adenoma 2000  . Venous insufficiency    Past Surgical History:  Procedure Laterality Date  . ABDOMINAL HYSTERECTOMY    . APPENDECTOMY    . CATARACT EXTRACTION, BILATERAL Bilateral 2017  . cataracts Bilateral   . COLONOSCOPY    . CYSTOSCOPY/RETROGRADE/URETEROSCOPY Bilateral  04/09/2017   Procedure: CYSTOSCOPY/RETROGRADE;  Surgeon: Renda Glance, MD;  Location: [facility];  Service: Urology;  Laterality: Bilateral;  ONLY NEEDS 30 MIN FOR PROCEDURE  . DILATION AND CURETTAGE OF UTERUS    . FUNCTIONAL ENDOSCOPIC SINUS SURGERY  05/2003   Dr. Floy  . hysterctomy and ooperectomy  1970's  . MASS EXCISION  2003   lt  . MASS EXCISION Left 09/17/2013   Procedure: EXCISION MASS LEFT HAND;  Surgeon: Donnice LABOR Sissy, MD;  Location: [facility];  Service: Orthopedics;  Laterality: Left;  . thyroid  lobectomy for a cyst  10/2000   Dr. Floy  . TONSILLECTOMY      reports that she has never smoked. She has never used smokeless tobacco. She reports that she does not drink alcohol and does not use drugs.  Allergies  Allergen Reactions  . Iodinated Contrast Media Other (See Comments)    Patient has 1 kidney and would like to anything done without contrast  . Amlodipine  Besylate Swelling and Other (See Comments)    Causes swelling  . Clonidine Nausea Only and Other (See Comments)    Sweating, felt faint  . Diltiazem Hcl Swelling and Other (See Comments)    meds did not work for her----causes swelling  . Klonopin  [Clonazepam ] Other (See Comments)    Vivid dreams  . Tetracycline Swelling  . Doxycycline Hyclate  Other (See Comments)    REACTION:  tetracycline  . Hydralazine Rash  . Iodine Other (See Comments)    IVP contrast Pt. States topical iodine is fine to use without any irritation. H.Mickley RN  . Labetalol Hcl Other (See Comments)    Unknown  . Lidocaine  Rash  . Olmesartan Medoxomil Other (See Comments)    Unknown   Family History  Problem Relation Age of Onset  . Heart disease Mother   . Diabetes Mother   . Congestive Heart Failure Mother   . Asthma Brother   . Throat cancer Brother 1  . Asthma Sister   . Brain cancer Sister 59  . Breast cancer Sister 77  . Colon cancer Neg Hx   . Esophageal cancer Neg Hx   . Stomach cancer Neg Hx    . Pancreatic cancer Neg Hx    Prior to Admission medications   Medication Sig Start Date End Date Taking? Authorizing Provider  allopurinol  (ZYLOPRIM ) 100 MG tablet TAKE 1 TABLET BY MOUTH EVERY DAY 05/14/23   Rollene Almarie LABOR, MD  aspirin  81 MG tablet Take 81 mg by mouth daily.    [provider]  dorzolamide -timolol  (COSOPT ) 22.3-6.8 MG/ML ophthalmic solution Place 1 drop into both eyes 2 (two) times daily.    [provider]  ezetimibe  (ZETIA ) 10 MG tablet Take 10 mg by mouth daily.    [provider]  latanoprost  (XALATAN ) 0.005 % ophthalmic solution Place 1 drop into both eyes at bedtime.    [provider]  levothyroxine  (SYNTHROID ) 50 MCG tablet TAKE 1 TABLET BY MOUTH DAILY BEFORE BREAKFAST 11/08/23   Rollene Almarie LABOR, MD  lisinopril  (ZESTRIL ) 10 MG tablet Take 1 tablet (10 mg total) by mouth 2 (two) times daily. 12/28/23   Santo Stanly LABOR, MD  nystatin  cream (MYCOSTATIN ) Apply to affected area 2 times daily 04/16/23   Rising, Asberry, PA-C  rosuvastatin  (CRESTOR ) 10 MG tablet Take 1 tablet (10 mg total) by mouth daily. 07/25/23 01/11/24  Santo Stanly LABOR, MD  triamterene -hydrochlorothiazide  (DYAZIDE ) 37.5-25 MG capsule TAKE 1 CAPSULE BY MOUTH EVERY DAY 08/15/23   Rollene Almarie LABOR, MD  Wheat Dextrin (BENEFIBER DRINK MIX PO) Take by mouth daily.    [provider]   Physical Exam: Vitals:   01/25/24 1958 01/25/24 2115  BP: (!) 200/93 (!) 174/96  Pulse: 62 (!) 56  Resp: 15 17  Temp: 97.8 F (36.6 C)   SpO2: 100% 100%   Constitutional: NAD, calm, comfortable Eyes: PERRL, lids and conjunctivae normal ENMT: Mucous membranes are moist. Posterior pharynx clear of any exudate or lesions.Normal dentition.  Neck: normal, supple, no masses, no thyromegaly Respiratory: clear to auscultation bilaterally, no wheezing, no crackles. Normal respiratory effort. No accessory muscle use.  Cardiovascular: Regular rate and rhythm,  no murmurs / rubs / gallops. No extremity edema. 2+ pedal pulses. No carotid bruits.  Abdomen: no tenderness, no masses palpated. No hepatosplenomegaly. Bowel sounds positive.  Musculoskeletal: no clubbing / cyanosis. No joint deformity upper and lower extremities. Good ROM, no contractures. Normal muscle tone.  Skin: no rashes, lesions, ulcers. No induration Neurologic: CN 2-12 grossly intact. Sensation intact, DTR normal. Strength 5/5 in all 4.  Psychiatric: Normal judgment and insight. Alert and oriented x 3. Normal mood.   (Anything < 9 systems with 2 bullets each down codes to level 1) (If patient refuses exam can't bill higher level) (Make sure to document decubitus ulcers present on admission -- if possible -- and whether  patient has chronic indwelling catheter at time of admission)  Labs on Admission: I have personally reviewed following labs and imaging studies  CBC: Recent Labs  Lab 01/25/24 2008  WBC 9.2  HGB 12.5  HCT 38.4  MCV 91.0  PLT 303   Basic Metabolic Panel: Recent Labs  Lab 01/25/24 2008  NA 137  K 4.0  CL 104  CO2 21*  GLUCOSE 115*  BUN 31*  CREATININE 1.52*  CALCIUM  8.7*   GFR: CrCl cannot be calculated (Unknown ideal weight.).  Coagulation Profile: Recent Labs  Lab 01/25/24 2008  INR 0.9   Urine analysis:    Component Value Date/Time   COLORURINE YELLOW 02/11/2021 0950   APPEARANCEUR CLEAR 02/11/2021 0950   LABSPEC 1.020 06/03/2021 1411   PHURINE 6.0 06/03/2021 1411   GLUCOSEU NEGATIVE 06/03/2021 1411   GLUCOSEU NEGATIVE 02/11/2021 0950   HGBUR SMALL (A) 06/03/2021 1411   BILIRUBINUR NEGATIVE 06/03/2021 1411   KETONESUR NEGATIVE 06/03/2021 1411   PROTEINUR 100 (A) 06/03/2021 1411   UROBILINOGEN 0.2 06/03/2021 1411   NITRITE NEGATIVE 06/03/2021 1411   LEUKOCYTESUR NEGATIVE 06/03/2021 1411   Radiological Exams on Admission: DG Chest 2 View Result Date: 01/25/2024 EXAM: 2 VIEW(S) XRAY OF THE CHEST 01/25/2024 08:27:12 PM COMPARISON:  04/16/2023 CLINICAL HISTORY: chest pain FINDINGS: LUNGS AND PLEURA: No focal pulmonary opacity. No pleural effusion. No pneumothorax. HEART AND MEDIASTINUM: Tortuous aorta with atherosclerosis. No acute abnormality of the cardiac silhouette. BONES AND SOFT TISSUES: Multilevel thoracic osteophytosis. IMPRESSION: 1. No acute cardiopulmonary process. Electronically signed by: Norman Gatlin MD 01/25/2024 08:37 PM EST RP Workstation: [id number]    EKG: Independently reviewed, initial EKG sinus bradycardia with rate in mid-50s. - repeat EKG has been ordered in light of troponin trend  Assessment/Plan ***  Open angle glaucoma: Continue home Dorzolamide -Timolol  x2 OU, Latanoprost  x1 OU    DVT prophylaxis: *** (Lovenox /Heparin/SCD's/anticoagulated/None (if comfort care) Code Status: *** (Full/Partial (specify details) Family Communication: *** (Specify name, relationship. Do not write discussed with patient. Specify tel # if discussed over the phone) Disposition Plan: pending PT/OT eval Consults called: cardiology Dr. Debarah Admission status: observation  Severity of Illness: The appropriate patient status for this patient is OBSERVATION. Observation status is judged to be reasonable and necessary in order to provide the required intensity of service to ensure the patient's safety. The patient's presenting symptoms, physical exam findings, and initial radiographic and laboratory data in the context of their medical condition is felt to place them at decreased risk for further clinical deterioration. Furthermore, it is anticipated that the patient will be medically stable for discharge from the hospital within 2 midnights of admission.   Betsey CHRISTELLA Helling MD [facility] Pager [phone]  If 7PM-7AM, please contact night-coverage [email or web address] Password [facility]  01/25/2024, 11:50 PM

## 2024-01-25 NOTE — ED Notes (Signed)
 When you have time, Prerna Harold (niece) [phone] would like an update; Thank you

## 2024-01-25 NOTE — H&P (Signed)
 History and Physical    April Donaldson FMW:[id number] DOB: [birth year] DOA: 01/25/2024  PCP: Rollene Almarie LABOR, MD  Outpatient Specialists:  Pulmonology-Adawele Neda, MD @[facility]  Cardiology-Mahesh Norton, MD @[facility]  Ophthalmology-Jeffrey Thresa Bracket, MD @[facility]  [facility], PA-C@[facility]  Phylliss Burnetta, DO @[facility]  Nephrology - Dr. Gearline Patient coming from: home  Chief Complaint: heart palpitations and tachycardia  HPI: April Donaldson is a 89 y.o. female with medical history significant of asymptomatic bradycardia, rare asymptomatic SVT, T2DM, HLD, hypothyroidism, OSA on CPAP, open angle glaucoma presents for heart palpitations, heart pounding, and tachycardia at home, worsening over the last 24 hours.  ED Course: Initial EKG with sinus bradycardia.  CBC unremarkable, BMP near baseline.  Troponin 25 > 225.  With change in troponin, ED called for consult.  There was no second EKG on file from around the time of the second troponin.  At admission, second EKG ordered and found to be unchanged from first.  ED ordered medications - No data to display  Labs Reviewed  BASIC METABOLIC PANEL WITH GFR - Abnormal; Notable for the following components:      Result Value   CO2 21 (*)    Glucose, Bld 115 (*)    BUN 31 (*)    Creatinine, Ser 1.52 (*)    Calcium  8.7 (*)    GFR, Estimated 32 (*)    All other components within normal limits  TROPONIN I (HIGH SENSITIVITY) - Abnormal; Notable for the following components:   Troponin I (High Sensitivity) 25 (*)    All other components within normal limits  TROPONIN I (HIGH SENSITIVITY) - Abnormal; Notable for the following components:   Troponin I (High Sensitivity) 225 (*)    All other components within normal limits  CBC  PROTIME-INR   Review of Systems: As per HPI otherwise 10 point review of systems negative.   Past Medical History:  Diagnosis Date   Anxiety    Benign neoplasm of kidney, except pelvis     Cellulitis of leg 03/09/2013 hosp    Cerebrovascular disease    Chronic low back pain    Diabetes mellitus without complication (HCC) 10/25/2021   Diastolic CHF (HCC)    Dr Victory Sharps   Difficult intubation    per patient with hand surgery in 2016 at surgical center- lip was pinched and swollen after surgery- patient states dr sissy never stated a problem with intubation    Diverticulosis    DJD (degenerative joint disease)    Dyspnea    with exertion    GERD (gastroesophageal reflux disease)    no meds   Glaucoma    Gout    H/O hiatal hernia    Hepatitis    hx of years ago    Hx of colonic polyps    Hypercholesteremia    Hypertension    Hypothyroid    IBS (irritable bowel syndrome)    Internal hemorrhoids    Obesity    OSA (obstructive sleep apnea)    NPSG 2009: AHI 20/h. CPAP - follows with [facility]   Renal insufficiency    one kidney small and non funtioning   Tubular adenoma 2000   Venous insufficiency    Past Surgical History:  Procedure Laterality Date   ABDOMINAL HYSTERECTOMY     APPENDECTOMY     CATARACT EXTRACTION, BILATERAL Bilateral 2017   cataracts Bilateral    COLONOSCOPY     CYSTOSCOPY/RETROGRADE/URETEROSCOPY Bilateral 04/09/2017   Procedure: CYSTOSCOPY/RETROGRADE;  Surgeon: Renda Glance, MD;  Location: [facility];  Service: Urology;  Laterality: Bilateral;  ONLY NEEDS 30 MIN FOR PROCEDURE   DILATION AND CURETTAGE OF UTERUS     FUNCTIONAL ENDOSCOPIC SINUS SURGERY  05/2003   Dr. Floy   hysterctomy and ooperectomy  1970's   MASS EXCISION  2003   lt   MASS EXCISION Left 09/17/2013   Procedure: EXCISION MASS LEFT HAND;  Surgeon: Donnice DELENA Robinsons, MD;  Location: [facility];  Service: Orthopedics;  Laterality: Left;   thyroid  lobectomy for a cyst  10/2000   Dr. Floy   TONSILLECTOMY      reports that she has never smoked. She has never used smokeless tobacco. She reports that she does not drink alcohol  and does not use  drugs.  Allergies  Allergen Reactions   Iodinated Contrast Media Other (See Comments)    Patient has 1 kidney and would like to anything done without contrast   Amlodipine  Besylate Swelling and Other (See Comments)    Causes swelling   Clonidine Nausea Only and Other (See Comments)    Sweating, felt faint   Diltiazem Hcl Swelling and Other (See Comments)    meds did not work for her----causes swelling   Klonopin  [Clonazepam ] Other (See Comments)    Vivid dreams   Tetracycline Swelling   Doxycycline Hyclate Other (See Comments)    REACTION:  tetracycline   Hydralazine Rash   Iodine Other (See Comments)    IVP contrast Pt. States topical iodine is fine to use without any irritation. H.Mickley RN   Labetalol Hcl Other (See Comments)    Unknown   Lidocaine  Rash   Olmesartan Medoxomil Other (See Comments)    Unknown   Family History  Problem Relation Age of Onset   Heart disease Mother    Diabetes Mother    Congestive Heart Failure Mother    Asthma Brother    Throat cancer Brother 36   Asthma Sister    Brain cancer Sister 48   Breast cancer Sister 64   Colon cancer Neg Hx    Esophageal cancer Neg Hx    Stomach cancer Neg Hx    Pancreatic cancer Neg Hx    Prior to Admission medications   Medication Sig Start Date End Date Taking? Authorizing Provider  allopurinol  (ZYLOPRIM ) 100 MG tablet TAKE 1 TABLET BY MOUTH EVERY DAY 05/14/23   Rollene Almarie DELENA, MD  aspirin  81 MG tablet Take 81 mg by mouth daily.    [provider]  dorzolamide -timolol  (COSOPT ) 22.3-6.8 MG/ML ophthalmic solution Place 1 drop into both eyes 2 (two) times daily.    [provider]  ezetimibe  (ZETIA ) 10 MG tablet Take 10 mg by mouth daily.    [provider]  latanoprost  (XALATAN ) 0.005 % ophthalmic solution Place 1 drop into both eyes at bedtime.    [provider]  levothyroxine  (SYNTHROID ) 50 MCG tablet TAKE 1 TABLET BY MOUTH DAILY BEFORE BREAKFAST 11/08/23    Rollene Almarie DELENA, MD  lisinopril  (ZESTRIL ) 10 MG tablet Take 1 tablet (10 mg total) by mouth 2 (two) times daily. 12/28/23   Santo Stanly DELENA, MD  nystatin  cream (MYCOSTATIN ) Apply to affected area 2 times daily 04/16/23   Rising, Asberry, PA-C  rosuvastatin  (CRESTOR ) 10 MG tablet Take 1 tablet (10 mg total) by mouth daily. 07/25/23 01/11/24  Santo Stanly DELENA, MD  triamterene -hydrochlorothiazide  (DYAZIDE ) 37.5-25 MG capsule TAKE 1 CAPSULE BY MOUTH EVERY DAY 08/15/23   Rollene Almarie DELENA, MD  Wheat Dextrin (BENEFIBER DRINK MIX  PO) Take by mouth daily.    [provider]   Physical Exam: Vitals:   01/25/24 1958 01/25/24 2115 01/26/24 0029 01/26/24 0100  BP: (!) 200/93 (!) 174/96  (!) 172/83  Pulse: 62 (!) 56  62  Resp: 15 17  13   Temp: 97.8 F (36.6 C)  98.1 F (36.7 C)   TempSrc:   Oral   SpO2: 100% 100%  100%   Constitutional: NAD, calm, comfortable Eyes: PERRL, lids and conjunctivae normal ENMT: Mucous membranes are moist. Posterior pharynx clear of any exudate or lesions.Normal dentition.  Neck: normal, supple, no masses, no thyromegaly Respiratory: clear to auscultation bilaterally, no wheezing, no crackles. Normal respiratory effort. No accessory muscle use.  Cardiovascular: Regular rate and rhythm, no murmurs / rubs / gallops. No extremity edema. 2+ pedal pulses. No carotid bruits.  Abdomen: no tenderness, no masses palpated. No hepatosplenomegaly. Bowel sounds positive.  Musculoskeletal: no clubbing / cyanosis. No joint deformity upper and lower extremities. Good ROM, no contractures. Normal muscle tone.  Skin: no rashes, lesions, ulcers. No induration Neurologic: CN 2-12 grossly intact. Sensation intact. Strength 5/5 in all 4.  Psychiatric: Normal judgment and insight. Alert and oriented x 3. Normal mood.   Labs on Admission: I have personally reviewed following labs and imaging studies  CBC: Recent Labs  Lab 01/25/24 2008  WBC 9.2  HGB 12.5   HCT 38.4  MCV 91.0  PLT 303   Basic Metabolic Panel: Recent Labs  Lab 01/25/24 2008  NA 137  K 4.0  CL 104  CO2 21*  GLUCOSE 115*  BUN 31*  CREATININE 1.52*  CALCIUM  8.7*   GFR: CrCl cannot be calculated (Unknown ideal weight.).  Coagulation Profile: Recent Labs  Lab 01/25/24 2008  INR 0.9   Radiological Exams on Admission: DG Chest 2 View Result Date: 01/25/2024 EXAM: 2 VIEW(S) XRAY OF THE CHEST 01/25/2024 08:27:12 PM COMPARISON: 04/16/2023 CLINICAL HISTORY: chest pain FINDINGS: LUNGS AND PLEURA: No focal pulmonary opacity. No pleural effusion. No pneumothorax. HEART AND MEDIASTINUM: Tortuous aorta with atherosclerosis. No acute abnormality of the cardiac silhouette. BONES AND SOFT TISSUES: Multilevel thoracic osteophytosis. IMPRESSION: 1. No acute cardiopulmonary process. Electronically signed by: Norman Gatlin MD 01/25/2024 08:37 PM EST RP Workstation: [id number]   EKG: Independently reviewed, initial EKG sinus bradycardia with rate in mid-50s.  Repeat EKG unchanged  Assessment/Plan  NSTEMI Troponins 25 > 225 > 302 with no appreciable changes in EKG.  Cardiology consulted, fellow reported they will come see in the morning.  Patient overall very well-appearing, vital signs stable aside from some hypertension. - Trend trops until flat - Cardiology to see 11/29 AM  Other home meds ordered: Allopurinol , aspirin , glaucoma eyedrops, Zetia , levothyroxine , lisinopril , rosuvastatin , triamterene -HCTZ  DVT prophylaxis: Lovenox  Code Status: Full Family Communication: None Disposition Plan: Home Consults called: cardiology Dr. Debarah Admission status: observation  Severity of Illness: The appropriate patient status for this patient is OBSERVATION. Observation status is judged to be reasonable and necessary in order to provide the required intensity of service to ensure the patient's safety. The patient's presenting symptoms, physical exam findings, and initial radiographic  and laboratory data in the context of their medical condition is felt to place them at decreased risk for further clinical deterioration. Furthermore, it is anticipated that the patient will be medically stable for discharge from the hospital within 2 midnights of admission.   Betsey CHRISTELLA Helling MD [facility] Pager [phone]  If 7PM-7AM, please contact night-coverage [email or web address] Password [facility]  01/26/2024, 2:44  AM

## 2024-01-26 ENCOUNTER — Observation Stay (HOSPITAL_COMMUNITY)

## 2024-01-26 ENCOUNTER — Other Ambulatory Visit: Payer: Self-pay | Admitting: Student

## 2024-01-26 DIAGNOSIS — R002 Palpitations: Secondary | ICD-10-CM

## 2024-01-26 DIAGNOSIS — E782 Mixed hyperlipidemia: Secondary | ICD-10-CM

## 2024-01-26 DIAGNOSIS — R079 Chest pain, unspecified: Secondary | ICD-10-CM

## 2024-01-26 DIAGNOSIS — I10 Essential (primary) hypertension: Secondary | ICD-10-CM

## 2024-01-26 DIAGNOSIS — I471 Supraventricular tachycardia, unspecified: Secondary | ICD-10-CM | POA: Diagnosis not present

## 2024-01-26 DIAGNOSIS — R7989 Other specified abnormal findings of blood chemistry: Secondary | ICD-10-CM | POA: Diagnosis not present

## 2024-01-26 DIAGNOSIS — E66811 Obesity, class 1: Secondary | ICD-10-CM

## 2024-01-26 DIAGNOSIS — N184 Chronic kidney disease, stage 4 (severe): Secondary | ICD-10-CM | POA: Diagnosis not present

## 2024-01-26 DIAGNOSIS — I160 Hypertensive urgency: Secondary | ICD-10-CM

## 2024-01-26 DIAGNOSIS — I214 Non-ST elevation (NSTEMI) myocardial infarction: Principal | ICD-10-CM | POA: Insufficient documentation

## 2024-01-26 DIAGNOSIS — E039 Hypothyroidism, unspecified: Secondary | ICD-10-CM

## 2024-01-26 LAB — TROPONIN I (HIGH SENSITIVITY)
Troponin I (High Sensitivity): 302 ng/L (ref <18)
Troponin I (High Sensitivity): 264 ng/L (ref <18)

## 2024-01-26 LAB — ECHOCARDIOGRAM COMPLETE
Area-P 1/2: 2.62 cm2
Height: 57 in
S' Lateral: 2.15 cm
Weight: 2448 [oz_av]

## 2024-01-26 MED ORDER — LISINOPRIL 10 MG PO TABS
10.0000 mg | ORAL_TABLET | Freq: Two times a day (BID) | ORAL | Status: DC
  Administered 2024-01-26 – 2024-01-27 (×4): 10 mg via ORAL
  Filled 2024-01-26 (×4): qty 1

## 2024-01-26 MED ORDER — ENOXAPARIN SODIUM 30 MG/0.3ML IJ SOSY
30.0000 mg | PREFILLED_SYRINGE | Freq: Every day | INTRAMUSCULAR | Status: DC
  Filled 2024-01-26 (×2): qty 0.3

## 2024-01-26 MED ORDER — ALLOPURINOL 100 MG PO TABS
100.0000 mg | ORAL_TABLET | Freq: Every day | ORAL | Status: DC
  Administered 2024-01-26 – 2024-01-27 (×2): 100 mg via ORAL
  Filled 2024-01-26 (×2): qty 1

## 2024-01-26 MED ORDER — LEVOTHYROXINE SODIUM 50 MCG PO TABS
50.0000 ug | ORAL_TABLET | Freq: Every day | ORAL | Status: DC
  Administered 2024-01-26 – 2024-01-27 (×2): 50 ug via ORAL
  Filled 2024-01-26: qty 1
  Filled 2024-01-26: qty 2

## 2024-01-26 MED ORDER — POLYVINYL ALCOHOL 1.4 % OP SOLN: 1.0000 [drp] | Freq: Four times a day (QID) | OPHTHALMIC | Status: DC | PRN

## 2024-01-26 MED ORDER — LATANOPROST 0.005 % OP SOLN
1.0000 [drp] | Freq: Every day | OPHTHALMIC | Status: DC
  Administered 2024-01-26: 1 [drp] via OPHTHALMIC
  Filled 2024-01-26 (×2): qty 2.5

## 2024-01-26 MED ORDER — TRIAMTERENE-HCTZ 37.5-25 MG PO TABS
1.0000 | ORAL_TABLET | Freq: Every day | ORAL | Status: DC
  Administered 2024-01-26: 1 via ORAL
  Filled 2024-01-26: qty 1

## 2024-01-26 MED ORDER — DORZOLAMIDE HCL-TIMOLOL MAL 2-0.5 % OP SOLN
1.0000 [drp] | Freq: Two times a day (BID) | OPHTHALMIC | Status: DC
  Administered 2024-01-26 – 2024-01-27 (×3): 1 [drp] via OPHTHALMIC
  Filled 2024-01-26 (×2): qty 10

## 2024-01-26 MED ORDER — EZETIMIBE 10 MG PO TABS
10.0000 mg | ORAL_TABLET | Freq: Every day | ORAL | Status: DC
  Administered 2024-01-26 – 2024-01-27 (×2): 10 mg via ORAL
  Filled 2024-01-26 (×2): qty 1

## 2024-01-26 MED ORDER — ASPIRIN 81 MG PO CHEW
81.0000 mg | CHEWABLE_TABLET | Freq: Every day | ORAL | Status: DC
  Administered 2024-01-26 – 2024-01-27 (×2): 81 mg via ORAL
  Filled 2024-01-26 (×2): qty 1

## 2024-01-26 MED ORDER — CHLORTHALIDONE 25 MG PO TABS
25.0000 mg | ORAL_TABLET | Freq: Every day | ORAL | Status: DC
  Administered 2024-01-27: 25 mg via ORAL
  Filled 2024-01-26: qty 1

## 2024-01-26 MED ORDER — ROSUVASTATIN CALCIUM 5 MG PO TABS
10.0000 mg | ORAL_TABLET | Freq: Every day | ORAL | Status: DC
  Administered 2024-01-26 – 2024-01-27 (×2): 10 mg via ORAL
  Filled 2024-01-26 (×2): qty 2

## 2024-01-26 MED ORDER — ACETAMINOPHEN 325 MG PO TABS
650.0000 mg | ORAL_TABLET | Freq: Four times a day (QID) | ORAL | Status: DC | PRN
  Administered 2024-01-26: 325 mg via ORAL
  Filled 2024-01-26: qty 2

## 2024-01-26 NOTE — Assessment & Plan Note (Addendum)
 Renal function today with serum cr at 1,52 with K at 4,0 and serum bicarbonate at 21 Na 137  Plan to continue close follow up renal function and electrolytes.

## 2024-01-26 NOTE — Care Management Obs Status (Signed)
 MEDICARE OBSERVATION STATUS NOTIFICATION   Patient Details  Name: April Donaldson MRN: [id number] Date of Birth: 1931-12-06   Medicare Observation Status Notification Given:  Yes    Marval Gell, RN 01/26/2024, 2:43 PM

## 2024-01-26 NOTE — Progress Notes (Signed)
-   Ordered 30 day Event Monitor for further evaluation of palpitations.  - Ordered repeat BMET in 1 week to reassess renal function.   Please see consult note from today for more information.  Dorien Mayotte E Artur Winningham, PA-C 01/26/2024 3:26 PM

## 2024-01-26 NOTE — Progress Notes (Signed)
   01/26/24 2257  BiPAP/CPAP/SIPAP  BiPAP/CPAP/SIPAP Pt Type Adult  Reason BIPAP/CPAP not in use Non-compliant

## 2024-01-26 NOTE — Assessment & Plan Note (Addendum)
 Tachy brady syndrome, apparently she has developed symptoms of palpitations off B blockade.   Echocardiogram with preserved LV systolic function with EF 60 to 65%, grade I diastolic dysfunction with impaired relaxation, RV systolic function preserved, RVSP 29.4 mmHg, LA and RA with normal size, with no significant valvular disease.  Currently she is sinus rhythm and telemetry with no tachycardia or ectopy.  She has remained sinus with rate 60 to 65 bpm.  Patient will be set up with a 30 days home heart monitor and plan to follow up as outpatient.   Elevation in high sensitive troponin likely due to tachyarrhythmia and elevated blood pressure, ruled out acute coronary syndrome. Ruled out NSTEMI Continue aspirin  and statin.

## 2024-01-26 NOTE — Assessment & Plan Note (Addendum)
 Hypertensive urgency.   Limited options due to bradycardia and reduced GFR.  Continue blood pressure control with lisinopril , changed hydrochlorothiazide  to chlorthalidone.

## 2024-01-26 NOTE — Consult Note (Addendum)
 Cardiology Consultation   Patient ID: April Donaldson MRN: [id number]; DOB: 05-22-1931  Admit date: 01/25/2024 Date of Consult: 01/26/2024  PCP:  Rollene Almarie LABOR, MD   [facility] Providers Cardiologist:  April LABOR Leavens, MD        Patient Profile: April Donaldson is a 89 y.o. female with a history of chronic HFpEF, asymptomatic bradycardia, asymptomatic SVT, hypertension, hyperlipidemia, hypothyroidism, type 2 diabetes mellitus, solitary kidney with CKD stage III, obstructive sleep apnea on CPAP,  GERD, IBS, and chronic low back pain who is being seen 01/26/2024 for the evaluation of NSTEMI at the request of Dr. Noralee.  History of Present Illness: April Donaldson is a 89 year old female with the above history who was previously followed by Dr. Claudene and now follows with Dr. Leavens. Monitor in 10/2022 showed predominantly sinus rhythm with average heart rate of 58 bpm, occasional asymptomatic SVT (longest run 18 beats) and occasional PACs (burden 1.2%).  Echo in 11/2022 showed LVEF of 55-60% with mild LVH and grade 2 diastolic dysfunction, normal RV function, mild biatrial enlargement, and mild MR.  She was last seen by Dr. Arnetha in 09/2023 at which time she was stable from a cardiac standpoint.  Patient presented to the ED on 01/25/2024 for further evaluation of chest pain and palpitations.  Upon arrival to the ED, patient markedly hypertensive with BP of 200/93. EKG showed sinus bradycardia, rate 59 beats minute, with no acute ischemic changes. High-sensitivity troponin 25 >> 225 >> 302 >> 264.  Chest x-ray showed no acute findings. WBC 9.2, Hgb 12.5, Plts 303. Na 137, K 4.0, Glucose 115, BUN 31, Cr 1.52.  Patient was admitted and Cardiology was consulted for further evaluation.  Patient reports intermittent tachypalpitations over the last week.  She had an episode of this yesterday evening with some associated weakness and shortness of breath so she was when  decided to come to the ED for further evaluation.  She also describes some left sided chest pain yesterday that sounds musculoskeletal in nature (worse when moving arm and reproducible with palpation). She denies any chest pain that sounds like angina and no shortness of breath outside episodes of palpitations. She is a very active 89 year old. She lives alone and does all her activities of daily living independently. She does activities like grocery shopping and has no symptoms with this. She denies any orthopnea, PND, edema, lightheadedness/ dizziness, or syncope. No recent fevers or illnesses. No abnormal bleeding in urine or stools.  Of note, she has been dealing with uncontrolled hypertension recently with systolic BP as high 200. Lisinopril  was increased at the end of 11/2023. She states this did help but she has still been having labile BP readings. BP was 200/93 on arrival to the ED but has improved to the 150s/70s without any intervention.  Past Medical History:  Diagnosis Date   Anxiety    Benign neoplasm of kidney, except pelvis    Cellulitis of leg 03/09/2013 hosp    Cerebrovascular disease    Chronic low back pain    Diabetes mellitus without complication (HCC) 10/25/2021   Diastolic CHF (HCC)    Dr Victory April Donaldson   Difficult intubation    per patient with hand surgery in 2016 at surgical center- lip was pinched and swollen after surgery- patient states dr sissy never stated a problem with intubation    Diverticulosis    DJD (degenerative joint disease)    Dyspnea    with exertion  GERD (gastroesophageal reflux disease)    no meds   Glaucoma    Gout    H/O hiatal hernia    Hepatitis    hx of years ago    Hx of colonic polyps    Hypercholesteremia    Hypertension    Hypothyroid    IBS (irritable bowel syndrome)    Internal hemorrhoids    Obesity    OSA (obstructive sleep apnea)    NPSG 2009: AHI 20/h. CPAP - follows with [facility]   Renal insufficiency    one kidney  small and non funtioning   Tubular adenoma 2000   Venous insufficiency     Past Surgical History:  Procedure Laterality Date   ABDOMINAL HYSTERECTOMY     APPENDECTOMY     CATARACT EXTRACTION, BILATERAL Bilateral 2017   cataracts Bilateral    COLONOSCOPY     CYSTOSCOPY/RETROGRADE/URETEROSCOPY Bilateral 04/09/2017   Procedure: CYSTOSCOPY/RETROGRADE;  Surgeon: Renda Glance, MD;  Location: [facility];  Service: Urology;  Laterality: Bilateral;  ONLY NEEDS 30 MIN FOR PROCEDURE   DILATION AND CURETTAGE OF UTERUS     FUNCTIONAL ENDOSCOPIC SINUS SURGERY  05/2003   Dr. Floy   hysterctomy and ooperectomy  1970's   MASS EXCISION  2003   lt   MASS EXCISION Left 09/17/2013   Procedure: EXCISION MASS LEFT HAND;  Surgeon: Donnice DELENA Robinsons, MD;  Location: [facility];  Service: Orthopedics;  Laterality: Left;   thyroid  lobectomy for a cyst  10/2000   Dr. Floy   TONSILLECTOMY       Home Medications:  Prior to Admission medications   Medication Sig Start Date End Date Taking? Authorizing Provider  acetaminophen  (TYLENOL ) 500 MG tablet Take 500 mg by mouth every 6 (six) hours as needed for mild pain (pain score 1-3).   Yes [provider]  allopurinol  (ZYLOPRIM ) 100 MG tablet TAKE 1 TABLET BY MOUTH EVERY DAY Patient taking differently: Take 100 mg by mouth daily at 12 noon. 05/14/23  Yes Rollene Almarie DELENA, MD  aspirin  81 MG chewable tablet Chew 81 mg by mouth daily at 12 noon.   Yes [provider]  carboxymethylcellul-glycerin (REFRESH OPTIVE) 0.5-0.9 % ophthalmic solution Place 1-2 drops into both eyes 4 (four) times daily as needed for dry eyes.   Yes [provider]  Cholecalciferol (VITAMIN D ) 50 MCG (2000 UT) CAPS Take 2,000 Units by mouth in the morning.   Yes [provider]  dorzolamide -timolol  (COSOPT ) 22.3-6.8 MG/ML ophthalmic solution Place 1 drop into both eyes 2 (two) times daily.   Yes [provider]  ezetimibe   (ZETIA ) 10 MG tablet Take 10 mg by mouth daily.   Yes [provider]  latanoprost  (XALATAN ) 0.005 % ophthalmic solution Place 1 drop into both eyes at bedtime.   Yes [provider]  levothyroxine  (SYNTHROID ) 50 MCG tablet TAKE 1 TABLET BY MOUTH DAILY BEFORE BREAKFAST 11/08/23  Yes Rollene Almarie DELENA, MD  lisinopril  (ZESTRIL ) 10 MG tablet Take 1 tablet (10 mg total) by mouth 2 (two) times daily. 12/28/23  Yes Chandrasekhar, Mahesh A, MD  Menthol, Topical Analgesic, (BIOFREEZE ROLL-ON EX) Apply 1 Application topically as needed (muscle/arthritis type pain).   Yes [provider]  rosuvastatin  (CRESTOR ) 10 MG tablet Take 1 tablet (10 mg total) by mouth daily. 07/25/23 01/26/24 Yes Chandrasekhar, Mahesh A, MD  triamterene -hydrochlorothiazide  (DYAZIDE ) 37.5-25 MG capsule TAKE 1 CAPSULE BY MOUTH EVERY DAY 08/15/23  Yes Rollene Almarie DELENA, MD  Wheat Dextrin (BENEFIBER DRINK  MIX PO) Take 1 packet by mouth daily. Use one packet daily either in a drink or apply over food.   Yes [provider]    Scheduled Meds:  allopurinol   100 mg Oral Daily   aspirin   81 mg Oral Q1200   dorzolamide -timolol   1 drop Both Eyes BID   enoxaparin  (LOVENOX ) injection  30 mg Subcutaneous Daily   ezetimibe   10 mg Oral Daily   latanoprost   1 drop Both Eyes QHS   levothyroxine   50 mcg Oral Q0600   lisinopril   10 mg Oral BID   rosuvastatin   10 mg Oral Daily   triamterene -hydrochlorothiazide   1 tablet Oral Daily   Continuous Infusions:  PRN Meds: artificial tears  Allergies:    Allergies  Allergen Reactions   Iodinated Contrast Media Other (See Comments)    Patient has 1 kidney and would like to anything done without contrast   Amlodipine  Besylate Swelling and Other (See Comments)    Causes swelling   Clonidine Nausea Only and Other (See Comments)    Sweating, felt faint   Diltiazem Hcl Swelling and Other (See Comments)    meds did not work for her----causes swelling    Klonopin  [Clonazepam ] Other (See Comments)    Vivid dreams   Tetracycline Swelling   Doxycycline Hyclate Other (See Comments)    REACTION:  tetracycline   Hydralazine Rash   Iodine Other (See Comments)    IVP contrast Pt. States topical iodine is fine to use without any irritation. H.Mickley RN   Labetalol Hcl Other (See Comments)    Unknown   Lidocaine  Rash   Olmesartan Medoxomil Other (See Comments)    Unknown    Social History:   Social History   Socioeconomic History   Marital status: Single    Spouse name: Not on file   Number of children: 1   Years of education: Not on file   Highest education level: Not on file  Occupational History   Occupation: retired from worked in Doctor, Hospital: RETIRED  Tobacco Use   Smoking status: Never   Smokeless tobacco: Never  Vaping Use   Vaping status: Never Used  Substance and Sexual Activity   Alcohol use: No    Alcohol/week: 0.0 standard drinks of alcohol   Drug use: No   Sexual activity: Not Currently  Other Topics Concern   Not on file  Social History Narrative   Lives alone, indep - never married   g-dtr in [place], siblings, nieces in town   Keeps in touch with a few friends who talk regularly   Social Drivers of Health   Financial Resource Strain: Low Risk  (04/17/2023)   Overall Financial Resource Strain (CARDIA)    Difficulty of Paying Living Expenses: Not hard at all  Food Insecurity: No Food Insecurity (04/17/2023)   Hunger Vital Sign    Worried About Running Out of Food in the Last Year: Never true    Ran Out of Food in the Last Year: Never true  Transportation Needs: No Transportation Needs (04/17/2023)   PRAPARE - Administrator, Civil Service (Medical): No    Lack of Transportation (Non-Medical): No  Physical Activity: Inactive (04/17/2023)   Exercise Vital Sign    Days of Exercise per Week: 0 days    Minutes of Exercise per Session: 0 min  Stress: No Stress Concern Present  (04/17/2023)   [employer] of Occupational Health - Occupational Stress Questionnaire  Feeling of Stress : Not at all  Social Connections: Moderately Isolated (04/17/2023)   Social Connection and Isolation Panel    Frequency of Communication with Friends and Family: More than three times a week    Frequency of Social Gatherings with Friends and Family: Never    Attends Religious Services: More than 4 times per year    Active Member of [employer] or Organizations: No    Attends Banker Meetings: Never    Marital Status: Widowed  Intimate Partner Violence: Not At Risk (01/24/2022)   Humiliation, Afraid, Rape, and Kick questionnaire    Fear of Current or Ex-Partner: No    Emotionally Abused: No    Physically Abused: No    Sexually Abused: No    Family History:   Family History  Problem Relation Age of Onset   Heart disease Mother    Diabetes Mother    Congestive Heart Failure Mother    Asthma Brother    Throat cancer Brother 59   Asthma Sister    Brain cancer Sister 2   Breast cancer Sister 57   Colon cancer Neg Hx    Esophageal cancer Neg Hx    Stomach cancer Neg Hx    Pancreatic cancer Neg Hx      ROS:  Please see the history of present illness.   Physical Exam/Data: Vitals:   01/26/24 0500 01/26/24 0802 01/26/24 0849 01/26/24 0924  BP: (!) 150/81  (!) 157/75 (!) 172/82  Pulse: 76  74 78  Resp: (!) 24  20 18   Temp:  (!) 97.5 F (36.4 C) 97.6 F (36.4 C) (!) 97.5 F (36.4 C)  TempSrc:  Oral Oral Oral  SpO2: 100%  100% 100%  Weight:    69.4 kg  Height:    4' 9 (1.448 m)   No intake or output data in the 24 hours ending 01/26/24 0948    01/26/2024    9:24 AM 01/11/2024   10:52 AM 10/23/2023   10:35 AM  Last 3 Weights  Weight (lbs) 153 lb 164 lb 162 lb 2 oz  Weight (kg) 69.4 kg 74.39 kg 73.539 kg     Body mass index is 33.11 kg/m.  General: 89 y.o. African-American female resting comfortably in no acute distress. HEENT: Normocephalic and  atraumatic. Sclera clear.  Neck: Supple. No carotid bruits. No JVD. Heart: RRR. II/VI systolic murmur. Left sided chest wall tenderness to palpation. Radial pulses 2+ and equal bilaterally. Le Lungs: No increased work of breathing. Clear to ausculation bilaterally. No wheezes, rhonchi, or rales.  Extremities: No lower extremity edema.    Skin: Warm and dry. Neuro: No focal deficits. Psych: Normal affect. Responds appropriately.  EKG:  The EKG was personally reviewed. Initial EKG showed on 01/25/2024 sinus bradycardia, rate 59 beats minute, with no acute ischemic changes. Repeat EKG on 01/26/2024 showed normal sinus rhythm, rate 63 bpm, with no acute ischemic changes. Telemetry:  Telemetry was personally reviewed and demonstrates:  Sinus rhythm with rates in the 50s to 70s.  Relevant CV Studies:  Monitor 11/15/2022 to 11/29/2022:   Patient had a minimum heart rate of 42 bpm, maximum heart rate of 154 bpm, and average heart rate of 58 bpm.   Predominant underlying rhythm was sinus rhythm.   Occasional paroxysmal SVT lasting 18 beats at longest.   Isolated PACs were occasional (1.2%).   Isolated PVCs were rare (<1.0%).   No symptoms recorded.   Occasional asymptomatic SVT. _______________  Echocardiogram 12/06/2022: Impressions:  1. Left ventricular ejection fraction, by estimation, is 55 to 60%. The  left ventricle has normal function. The left ventricle has no regional  wall motion abnormalities. There is mild concentric left ventricular  hypertrophy. Left ventricular diastolic  parameters are consistent with Grade II diastolic dysfunction  (pseudonormalization).   2. Right ventricular systolic function is normal. The right ventricular  size is normal. There is normal pulmonary artery systolic pressure.   3. Left atrial size was mildly dilated.   4. Right atrial size was mildly dilated.   5. The mitral valve is normal in structure. Mild mitral valve  regurgitation. No evidence of  mitral stenosis.   6. The aortic valve is tricuspid. There is mild calcification of the  aortic valve. Aortic valve regurgitation is not visualized. Aortic valve  sclerosis/calcification is present, without any evidence of aortic  stenosis.   7. The inferior vena cava is normal in size with greater than 50%  respiratory variability, suggesting right atrial pressure of 3 mmHg.    Laboratory Data: High Sensitivity Troponin:   Recent Labs  Lab 01/25/24 2008 01/25/24 2223 01/26/24 0051 01/26/24 0257  TROPONINIHS 25* 225* 302* 264*     Chemistry Recent Labs  Lab 01/25/24 2008  NA 137  K 4.0  CL 104  CO2 21*  GLUCOSE 115*  BUN 31*  CREATININE 1.52*  CALCIUM  8.7*  GFRNONAA 32*  ANIONGAP 12    No results for input(s): PROT, ALBUMIN, AST, ALT, ALKPHOS, BILITOT in the last 168 hours. Lipids No results for input(s): CHOL, TRIG, HDL, LABVLDL, LDLCALC, CHOLHDL in the last 168 hours.  Hematology Recent Labs  Lab 01/25/24 2008  WBC 9.2  RBC 4.22  HGB 12.5  HCT 38.4  MCV 91.0  MCH 29.6  MCHC 32.6  RDW 13.0  PLT 303   Thyroid  No results for input(s): TSH, FREET4 in the last 168 hours.  BNPNo results for input(s): BNP, PROBNP in the last 168 hours.  DDimer No results for input(s): DDIMER in the last 168 hours.  Radiology/Studies:  DG Chest 2 View Result Date: 01/25/2024 EXAM: 2 VIEW(S) XRAY OF THE CHEST 01/25/2024 08:27:12 PM COMPARISON: 04/16/2023 CLINICAL HISTORY: chest pain FINDINGS: LUNGS AND PLEURA: No focal pulmonary opacity. No pleural effusion. No pneumothorax. HEART AND MEDIASTINUM: Tortuous aorta with atherosclerosis. No acute abnormality of the cardiac silhouette. BONES AND SOFT TISSUES: Multilevel thoracic osteophytosis. IMPRESSION: 1. No acute cardiopulmonary process. Electronically signed by: Norman Gatlin MD 01/25/2024 08:37 PM EST RP Workstation: [id number]     Assessment and Plan:  Elevated Troponin Atypical Chest  Pain Patient presented to the ED yesterday evening for further evaluation of tachy-palpitations and some atypical chest pain.  EKG showed sinus bradycardia rate 59 beats minute, with no acute ischemic changes.  High sensitive troponin elevated at 25 >> 225 >> 302 >> 264. - Echo pending. - She is currently chest pain-free.  The pain that she was having sounds musculoskeletal in nature.  It was worse when moving her left arm and reproducible with palpation.  She denies any chest pain that sounds like angina. -Troponin elevation may be due to demand ischemia in the setting of markedly elevated BP on arrival (systolic BP was 200).  I also wonder if she is having some tachyarrhythmias based off her description of her palpitations.  She does have a history of short runs of paroxysmal SVT noted on prior monitor.  Will hold off on IV heparin discussed with MD.  Further recommendations pending echo results.  She is 89 years old but she is incredibly active.  She lives by herself and does all her activities of daily living independently.  However, she is not sure that she would want any invasive procedures at this point.  Palpitations Patient reports intemittent tachypalpitations over the last week. Prior monitor in 10/2022 showed occasional asymptomatic episodes of SVT.  - No arrhythmias noted on EKG or telemetry. Rates mostly in the 50s to 60s. - Potassium 4.0 today. Will check Magnesium and TSH. - Echo pending.  - Would avoid AV nodal agents given history of bradycardia. - May benefit from another outpatient monitor at discharge depending on what telemetry shows while here.  Hypertension Patient has been dealing with uncontrolled and labile BP at home recently. BP markedly elevated at 200/93 on arrival to the ED but has improved to the 150s/70s without any intervention. - Current medications: Lisinopril  10mg  twice daily and Triamterene -HCTZ 37.5-25mg  daily. - She has been intolerant to multiple  antihypertensive in the past including Amlodipine , Diltiazem, Labetalol, Olmesartan, Hydralazine, and Clonidine.  Therefore, options for medication adjustments are limited.  Could consider Cardura but would be cautious with this given her age.  Continue to monitor for now given improvement without any intervention.  Hyperlipidemia - Continue Crestor  10mg  daily.  CKD Stage III Solitary Kidney Patient only has one functioning kidney and has underlying CKD stage III.  Recent creatinine has been around 1.3 to 1.4 although baseline earlier this year was around 1.1.  Creatinine 1.52 on admission. - Avoid nephrotoxic agents. - Continue to monitor.  Otherwise, per primary team: - Type 2 diabetes mellitus - Hypothyroidism - Obstructive sleep apnea - GERD - IBS - Chronic back pain   Risk Assessment/Risk Scores:  For questions or updates, please contact [facility] Please consult [email or web address] for contact info under      Signed, Faduma Cho E Inga Noller, PA-C  01/26/2024 9:48 AM

## 2024-01-26 NOTE — Progress Notes (Signed)
 2D echo personally reviewed and demonstrates normal LVF with no RWMAs.  Mildly elevated Trop c/w demand ischemia in the setting of HTNsive urgency. CP was atypical and reproducible with palpitations.  Patient feels it is muscular.  Given advanced age and no prior CP would not pursue ischemic workup at this time unless she has recurrent CP.  Will set up 30 day outpt event monitor to assess palpitations and followup with Dr. Santo in 2 weeks  In regards to BP control>>she has multiple med intolerances and resting HR is in the 50s so cannot use BB or CCB.  I have stopped Dyazide  and started Chlorthalidone.  Will set up BMET in 1 week as well as OV with PharmD for BP follwoup

## 2024-01-26 NOTE — Assessment & Plan Note (Signed)
Continue with ezetimibe and rosuvastatin.  ?

## 2024-01-26 NOTE — Progress Notes (Signed)
  Echocardiogram 2D Echocardiogram has been performed.  April Donaldson 01/26/2024, 2:26 PM

## 2024-01-26 NOTE — Progress Notes (Signed)
  Progress Note   Patient: April Donaldson FMW:[id number] DOB: 1931/04/03 DOA: 01/25/2024     0 DOS: the patient was seen and examined on 01/26/2024   Brief hospital course: [name] was admitted to the hospital with the working diagnosis of chest pain.   89 yo female with the past medical history of hyperlipidemia, hypothyroidism, SVT and T2DM who presented with palpitations. Reported 24 hrs of persistent palpitations at home, associated chest pain, prompting her to come to the ED.   On her initial physical examination blood pressure 200/93, HR 56, RR 17 and 02 saturation 100% Lungs with no wheezing, and no rhonchi, heart with S1 and S2 present and regular, abdomen with no distention and no lower extremity edema.   Na 137, K 4.0 Cl 104 bicarbonate 21, glucose 115 bun 31 cr 1.52 High sensitive troponin 25, 225 and 302  Wbc 9,2 hgb 12.5 plt 303   Chest radiograph with cardiomegaly with no effusions or infiltrates.   EKG 59 bpm, left axis deviation, qtc 415, sinus rhythm with no significant ST segment or T wave changes.   Assessment and Plan: * SVT (supraventricular tachycardia) Tachy brady syndrome, apparently she has developed symptoms of palpitations off B blockade.  Currently she is sinus rhythm and telemetry with no tachycardia.   Pending echocardiogram.  Possible re challenge with low dose B blocker or antiarrhythmic agents, plus home cardiac monitor.  Will follow up with Cardiology recommendations Out of bed and ambulate in the hallway.   Elevation in high sensitive troponin likely due to tachyarrhythmia, ruled out acute coronary syndrome. Ruled out NSATEMI  Essential hypertension Continue blood pressure control with lisinopril  and triamterene  with hydrochlorothiazide .   CKD (chronic kidney disease) stage 4, GFR 15-29 ml/min (HCC) Renal function today with serum cr at 1,52 with K at 4,0 and serum bicarbonate at 21 Na 137  Plan to continue close follow up renal function  and electrolytes.   Hyperlipidemia Continue with ezetimibe  and rosuvastatin    Hypothyroidism Continue levothyroxine .  TSH on 04/2023 2.25   Obesity, class 1 Calculated BMI is 33.1         Subjective: patient with no chest pain or palpitations, no dizziness or lightheadedness.   Physical Exam: Vitals:   01/26/24 0400 01/26/24 0500 01/26/24 0802 01/26/24 0849  BP: (!) 158/81 (!) 150/81  (!) 157/75  Pulse: 61 76  74  Resp: (!) 24 (!) 24  20  Temp:   (!) 97.5 F (36.4 C) 97.6 F (36.4 C)  TempSrc:   Oral Oral  SpO2: 100% 100%  100%   Neurology awake and alert ENT with mild pallor Cardiovascular with S1 and S2 present and regular with no gallops, rubs or murmurs Respiratory with no rales or wheezing, no rhonchi Abdomen with no distention  No lower extremity edema   Data Reviewed:    Family Communication: no family at the bedside   Disposition: Status is: Observation The patient remains OBS appropriate and will d/c before 2 midnights.  Planned Discharge Destination: Home     Author: Elidia Toribio Furnace, MD 01/26/2024 9:23 AM  For on call review [email or web address].

## 2024-01-26 NOTE — Assessment & Plan Note (Signed)
Calculated BMI is 33.1

## 2024-01-26 NOTE — Assessment & Plan Note (Signed)
 Continue levothyroxine .  TSH on 04/2023 2.25

## 2024-01-26 NOTE — Hospital Course (Signed)
 [name] was admitted to the hospital with the working diagnosis of chest pain.   89 yo female with the past medical history of hyperlipidemia, hypothyroidism, SVT and T2DM who presented with palpitations. Reported 24 hrs of persistent palpitations at home, associated chest pain, prompting her to come to the ED.   On her initial physical examination blood pressure 200/93, HR 56, RR 17 and 02 saturation 100% Lungs with no wheezing, and no rhonchi, heart with S1 and S2 present and regular, abdomen with no distention and no lower extremity edema.   Na 137, K 4.0 Cl 104 bicarbonate 21, glucose 115 bun 31 cr 1.52 High sensitive troponin 25, 225 and 302  Wbc 9,2 hgb 12.5 plt 303   Chest radiograph with cardiomegaly with no effusions or infiltrates.   EKG 59 bpm, left axis deviation, qtc 415, sinus rhythm with no significant ST segment or T wave changes.   Patient has placed on telemetry monitoring, she remained sinus rhythm. Echocardiogram with preserved LV systolic function.  Blood pressure regime has been adjusted.   11/30 patient with no palpitations or chest pain, plan to follow up as outpatient.

## 2024-01-27 DIAGNOSIS — N184 Chronic kidney disease, stage 4 (severe): Secondary | ICD-10-CM | POA: Diagnosis not present

## 2024-01-27 DIAGNOSIS — I10 Essential (primary) hypertension: Secondary | ICD-10-CM | POA: Diagnosis not present

## 2024-01-27 DIAGNOSIS — E782 Mixed hyperlipidemia: Secondary | ICD-10-CM | POA: Diagnosis not present

## 2024-01-27 DIAGNOSIS — I471 Supraventricular tachycardia, unspecified: Secondary | ICD-10-CM | POA: Diagnosis not present

## 2024-01-27 LAB — BASIC METABOLIC PANEL WITH GFR
Anion gap: 12 (ref 5–15)
BUN: 30 mg/dL — ABNORMAL HIGH (ref 8–23)
CO2: 22 mmol/L (ref 22–32)
Calcium: 8.1 mg/dL — ABNORMAL LOW (ref 8.9–10.3)
Chloride: 101 mmol/L (ref 98–111)
Creatinine, Ser: 1.32 mg/dL — ABNORMAL HIGH (ref 0.44–1.00)
GFR, Estimated: 38 mL/min — ABNORMAL LOW (ref >60)
Glucose, Bld: 102 mg/dL — ABNORMAL HIGH (ref 70–99)
Potassium: 3.5 mmol/L (ref 3.5–5.1)
Sodium: 135 mmol/L (ref 135–145)

## 2024-01-27 LAB — MAGNESIUM: Magnesium: 2.1 mg/dL (ref 1.7–2.4)

## 2024-01-27 MED ORDER — CHLORTHALIDONE 25 MG PO TABS: 25.0000 mg | ORAL_TABLET | Freq: Every day | ORAL | 0 refills | Status: DC

## 2024-01-27 NOTE — Plan of Care (Signed)

## 2024-01-27 NOTE — Discharge Summary (Signed)
 Physician Discharge Summary   Patient: April Donaldson MRN: [id number] DOB: 08-28-1931  Admit date:     01/25/2024  Discharge date: 01/27/24  Discharge Physician: Elidia Sieving Jiovanny Burdell   PCP: Rollene Almarie LABOR, MD   Recommendations at discharge:    Hydrochlorothiazide  was changed to chlorthalidone for better blood pressure control.  Patient will be set up with 30 day heart monitor at home. Follow up renal function and electrolytes as outpatient in 7 days Follow up with Dr Rollene in 7 to 10 days Follow up with Cardiology as scheduled.   Discharge Diagnoses: Principal Problem:   SVT (supraventricular tachycardia) Active Problems:   Essential hypertension   CKD (chronic kidney disease) stage 4, GFR 15-29 ml/min (HCC)   Hyperlipidemia   Hypothyroidism   Obesity, class 1   Chest pain of uncertain etiology   Palpitations   Elevated troponin   Hypertensive urgency  Resolved Problems:   * No resolved hospital problems. [facility] Course: [name] was admitted to the hospital with the working diagnosis of chest pain.   89 yo female with the past medical history of hyperlipidemia, hypothyroidism, SVT and T2DM who presented with palpitations. Reported 24 hrs of persistent palpitations at home, associated chest pain, prompting her to come to the ED.   On her initial physical examination blood pressure 200/93, HR 56, RR 17 and 02 saturation 100% Lungs with no wheezing, and no rhonchi, heart with S1 and S2 present and regular, abdomen with no distention and no lower extremity edema.   Na 137, K 4.0 Cl 104 bicarbonate 21, glucose 115 bun 31 cr 1.52 High sensitive troponin 25, 225 and 302  Wbc 9,2 hgb 12.5 plt 303   Chest radiograph with cardiomegaly with no effusions or infiltrates.   EKG 59 bpm, left axis deviation, qtc 415, sinus rhythm with no significant ST segment or T wave changes.   Patient has placed on telemetry monitoring, she remained sinus  rhythm. Echocardiogram with preserved LV systolic function.  Blood pressure regime has been adjusted.   11/30 patient with no palpitations or chest pain, plan to follow up as outpatient.   Assessment and Plan: * SVT (supraventricular tachycardia) Tachy brady syndrome, apparently she has developed symptoms of palpitations off B blockade.   Echocardiogram with preserved LV systolic function with EF 60 to 65%, grade I diastolic dysfunction with impaired relaxation, RV systolic function preserved, RVSP 29.4 mmHg, LA and RA with normal size, with no significant valvular disease.  Currently she is sinus rhythm and telemetry with no tachycardia or ectopy.  She has remained sinus with rate 60 to 65 bpm.  Patient will be set up with a 30 days home heart monitor and plan to follow up as outpatient.   Elevation in high sensitive troponin likely due to tachyarrhythmia and elevated blood pressure, ruled out acute coronary syndrome. Ruled out NSTEMI Continue aspirin  and statin.   Essential hypertension Hypertensive urgency.   Limited options due to bradycardia and reduced GFR.  Continue blood pressure control with lisinopril , changed hydrochlorothiazide  to chlorthalidone.   CKD (chronic kidney disease) stage 4, GFR 15-29 ml/min (HCC) At the time of her discharge her renal function is stable with serum cr at 1,32 with K at 3,5 and serum bicarbonate at 22 Na 135 and Mg 2.1   Plan to continue thiazide diuretic and follow up as outpatient.   Hyperlipidemia Continue with ezetimibe  and rosuvastatin    Hypothyroidism Continue levothyroxine .  TSH on 04/2023 2.25   Obesity, class  1 Calculated BMI is 33.1        Consultants: cardiology  Procedures performed: none   Disposition: Home Diet recommendation:  Cardiac diet DISCHARGE MEDICATION: Allergies as of 01/27/2024       Reactions   Iodinated Contrast Media Other (See Comments)   Patient has 1 kidney and would like to anything done  without contrast   Amlodipine  Besylate Swelling, Other (See Comments)   Causes swelling   Clonidine Nausea Only, Other (See Comments)   Sweating, felt faint   Diltiazem Hcl Swelling, Other (See Comments)   meds did not work for her----causes swelling   Klonopin  [clonazepam ] Other (See Comments)   Vivid dreams   Tetracycline Swelling   Doxycycline Hyclate Other (See Comments)   REACTION:  tetracycline   Hydralazine Rash   Iodine Other (See Comments)   IVP contrast Pt. States topical iodine is fine to use without any irritation. H.Mickley RN   Labetalol Hcl Other (See Comments)   Unknown   Lidocaine  Rash   Olmesartan Medoxomil Other (See Comments)   Unknown        Medication List     STOP taking these medications    triamterene -hydrochlorothiazide  37.5-25 MG capsule Commonly known as: DYAZIDE        TAKE these medications    acetaminophen  500 MG tablet Commonly known as: TYLENOL  Take 500 mg by mouth every 6 (six) hours as needed for mild pain (pain score 1-3).   allopurinol  100 MG tablet Commonly known as: ZYLOPRIM  TAKE 1 TABLET BY MOUTH EVERY DAY What changed: when to take this   aspirin  81 MG chewable tablet Chew 81 mg by mouth daily at 12 noon.   BENEFIBER DRINK MIX PO Take 1 packet by mouth daily. Use one packet daily either in a drink or apply over food.   BIOFREEZE ROLL-ON EX Apply 1 Application topically as needed (muscle/arthritis type pain).   carboxymethylcellul-glycerin 0.5-0.9 % ophthalmic solution Commonly known as: REFRESH OPTIVE Place 1-2 drops into both eyes 4 (four) times daily as needed for dry eyes.   chlorthalidone 25 MG tablet Commonly known as: HYGROTON Take 1 tablet (25 mg total) by mouth daily. Start taking on: January 28, 2024   dorzolamide -timolol  2-0.5 % ophthalmic solution Commonly known as: COSOPT  Place 1 drop into both eyes 2 (two) times daily.   ezetimibe  10 MG tablet Commonly known as: ZETIA  Take 10 mg by mouth  daily.   latanoprost  0.005 % ophthalmic solution Commonly known as: XALATAN  Place 1 drop into both eyes at bedtime.   levothyroxine  50 MCG tablet Commonly known as: SYNTHROID  TAKE 1 TABLET BY MOUTH DAILY BEFORE BREAKFAST   lisinopril  10 MG tablet Commonly known as: ZESTRIL  Take 1 tablet (10 mg total) by mouth 2 (two) times daily.   rosuvastatin  10 MG tablet Commonly known as: CRESTOR  Take 1 tablet (10 mg total) by mouth daily.   Vitamin D  50 MCG (2000 UT) Caps Take 2,000 Units by mouth in the morning.        Follow-up Information     Goodrich, Callie E, PA-C Follow up.   Specialty: Cardiology Why: Hospital follow-up with Cardiology scheduled for 02/14/2024 at 1:55pm. Please arrive 20 minutes early for check-in. If this date/ time does not work for you, please call our office to reschedule. Contact information: [address] [place] [address] [phone]         [facility] Follow up.  Specialty: Cardiology Why: Please come by our office in 1 week (02/04/2024) for repeat labs so that we can recheck your kidney function. Contact information: [address] [place]  [address] [phone]               Discharge Exam: Fredricka Weights   01/26/24 0924 01/27/24 0500  Weight: 69.4 kg 73.8 kg   BP (!) 141/79 (BP Location: Left Arm)   Pulse 83   Temp (!) 96.5 F (35.8 C) (Axillary)   Resp 18   Ht 4' 9 (1.448 m)   Wt 73.8 kg   SpO2 100%   BMI 35.19 kg/m   Patient is feeling better, no further episodes of chest pain or palpitations.  Neurology awake and alert ENT with mild pallor with no icterus Cardiovascular with S1 and S2 present and regular with no gallops or rubs Respiratory with no rales or wheezing, no rhonchi  Abdomen with no distention  No lower extremity edema   Condition at discharge: stable  The results of significant diagnostics from this hospitalization  (including imaging, microbiology, ancillary and laboratory) are listed below for reference.   Imaging Studies: ECHOCARDIOGRAM COMPLETE Result Date: 01/26/2024    ECHOCARDIOGRAM REPORT   Patient Name:   April Donaldson Date of Exam: 01/26/2024 Medical Rec #:  [id number]      Height:       57.0 in Accession #:    [id number]     Weight:       153.0 lb Date of Birth:  [birth year]     BSA:          1.605 m Patient Age:    89 years       BP:           158/68 mmHg Patient Gender: F              HR:           61 bpm. Exam Location:  Inpatient Procedure: 2D Echo (Both Spectral and Color Flow Doppler were utilized during            procedure). Indications:    elevated troponin  History:        Patient has prior history of Echocardiogram examinations, most                 recent 12/06/2022. Chronic kidney disease, Arrythmias:SVT; Risk                 Factors:Hypertension, Dyslipidemia and Sleep Apnea.  Sonographer:    Tinnie Barefoot RDCS Referring Phys: [id number] CALLIE E GOODRICH IMPRESSIONS  1. Left ventricular ejection fraction, by estimation, is 60 to 65%. The left ventricle has normal function. The left ventricle has no regional wall motion abnormalities. Left ventricular diastolic parameters are consistent with Grade I diastolic dysfunction (impaired relaxation).  2. Right ventricular systolic function is normal. The right ventricular size is normal. There is normal pulmonary artery systolic pressure.  3. The mitral valve is normal in structure. No evidence of mitral valve regurgitation. No evidence of mitral stenosis.  4. The aortic valve is calcified. There is mild calcification of the aortic valve. There is mild thickening of the aortic valve. Aortic valve regurgitation is not visualized. Aortic valve sclerosis/calcification is present, without any evidence of aortic stenosis.  5. The inferior vena cava is normal in size with greater than 50% respiratory variability, suggesting right atrial pressure of 3 mmHg.  FINDINGS  Left Ventricle: Left ventricular ejection fraction, by estimation,  is 60 to 65%. The left ventricle has normal function. The left ventricle has no regional wall motion abnormalities. The left ventricular internal cavity size was normal in size. There is  no left ventricular hypertrophy. Left ventricular diastolic parameters are consistent with Grade I diastolic dysfunction (impaired relaxation). Normal left ventricular filling pressure. Right Ventricle: The right ventricular size is normal. No increase in right ventricular wall thickness. Right ventricular systolic function is normal. There is normal pulmonary artery systolic pressure. The tricuspid regurgitant velocity is 2.57 m/s, and  with an assumed right atrial pressure of 3 mmHg, the estimated right ventricular systolic pressure is 29.4 mmHg. Left Atrium: Left atrial size was normal in size. Right Atrium: Right atrial size was normal in size. Pericardium: There is no evidence of pericardial effusion. Mitral Valve: The mitral valve is normal in structure. No evidence of mitral valve regurgitation. No evidence of mitral valve stenosis. Tricuspid Valve: The tricuspid valve is normal in structure. Tricuspid valve regurgitation is not demonstrated. No evidence of tricuspid stenosis. Aortic Valve: The aortic valve is calcified. There is mild calcification of the aortic valve. There is mild thickening of the aortic valve. Aortic valve regurgitation is not visualized. Aortic valve sclerosis/calcification is present, without any evidence of aortic stenosis. Pulmonic Valve: The pulmonic valve was normal in structure. Pulmonic valve regurgitation is not visualized. No evidence of pulmonic stenosis. Aorta: The aortic root is normal in size and structure. Venous: The inferior vena cava is normal in size with greater than 50% respiratory variability, suggesting right atrial pressure of 3 mmHg. IAS/Shunts: No atrial level shunt detected by color flow Doppler.  LEFT  VENTRICLE PLAX 2D LVIDd:         4.30 cm   Diastology LVIDs:         2.15 cm   LV e' medial:    5.98 cm/s LV PW:         1.00 cm   LV E/e' medial:  12.4 LV IVS:        0.80 cm   LV e' lateral:   5.87 cm/s LVOT diam:     1.70 cm   LV E/e' lateral: 12.7 LV SV:         51 LV SV Index:   32 LVOT Area:     2.27 cm LV IVRT:       121 msec  RIGHT VENTRICLE             IVC RV Basal diam:  2.00 cm     IVC diam: 1.20 cm RV S prime:     20.70 cm/s TAPSE (M-mode): 1.7 cm LEFT ATRIUM             Index        RIGHT ATRIUM          Index LA diam:        3.50 cm 2.18 cm/m   RA Area:     9.19 cm LA Vol (A2C):   44.0 ml 27.41 ml/m  RA Volume:   16.20 ml 10.09 ml/m LA Vol (A4C):   39.9 ml 24.86 ml/m LA Biplane Vol: 42.6 ml 26.54 ml/m  AORTIC VALVE LVOT Vmax:   114.00 cm/s LVOT Vmean:  71.700 cm/s LVOT VTI:    0.224 m  AORTA Ao Root diam: 2.70 cm Ao Asc diam:  3.00 cm MITRAL VALVE                TRICUSPID VALVE MV Area (PHT): 2.62 cm  TR Peak grad:   26.4 mmHg MV Decel Time: 290 msec     TR Vmax:        257.00 cm/s MV E velocity: 74.40 cm/s MV A velocity: 108.00 cm/s  SHUNTS MV E/A ratio:  0.69         Systemic VTI:  0.22 m                             Systemic Diam: 1.70 cm Wilbert Bihari MD Electronically signed by Wilbert Bihari MD Signature Date/Time: 01/26/2024/2:54:18 PM    Final    DG Chest 2 View Result Date: 01/25/2024 EXAM: 2 VIEW(S) XRAY OF THE CHEST 01/25/2024 08:27:12 PM COMPARISON: 04/16/2023 CLINICAL HISTORY: chest pain FINDINGS: LUNGS AND PLEURA: No focal pulmonary opacity. No pleural effusion. No pneumothorax. HEART AND MEDIASTINUM: Tortuous aorta with atherosclerosis. No acute abnormality of the cardiac silhouette. BONES AND SOFT TISSUES: Multilevel thoracic osteophytosis. IMPRESSION: 1. No acute cardiopulmonary process. Electronically signed by: Norman Gatlin MD 01/25/2024 08:37 PM EST RP Workstation: [id number]    Microbiology: Results for orders placed or performed during the hospital encounter of  08/27/22  SARS CORONAVIRUS 2 (TAT 6-24 HRS) Anterior Nasal Swab     Status: Abnormal   Collection Time: 08/27/22 11:05 AM   Specimen: Anterior Nasal Swab  Result Value Ref Range Status   SARS Coronavirus 2 POSITIVE (A) NEGATIVE Final    Comment: (NOTE) SARS-CoV-2 target nucleic acids are DETECTED.  The SARS-CoV-2 RNA is generally detectable in upper and lower respiratory specimens during the acute phase of infection. Positive results are indicative of the presence of SARS-CoV-2 RNA. Clinical correlation with patient history and other diagnostic information is  necessary to determine patient infection status. Positive results do not rule out bacterial infection or co-infection with other viruses.  The expected result is Negative.  Fact Sheet for Patients: hairslick.no  Fact Sheet for Healthcare Providers: quierodirigir.com  This test is not yet approved or cleared by the [place]  FDA and  has been authorized for detection and/or diagnosis of SARS-CoV-2 by FDA under an Emergency Use Authorization (EUA). This EUA will remain  in effect (meaning this test can be used) for the duration of the COVID-19 declaration under Section 564(b)(1) of the Act, 21 U. S.C. section 360bbb-3(b)(1), unless the authorization is terminated or revoked sooner.   Performed at [facility], [address] [place], [place] [address]     Labs: CBC: Recent Labs  Lab 01/25/24 2008  WBC 9.2  HGB 12.5  HCT 38.4  MCV 91.0  PLT 303   Basic Metabolic Panel: Recent Labs  Lab 01/25/24 2008 01/27/24 0207  NA 137 135  K 4.0 3.5  CL 104 101  CO2 21* 22  GLUCOSE 115* 102*  BUN 31* 30*  CREATININE 1.52* 1.32*  CALCIUM  8.7* 8.1*  MG  --  2.1   Liver Function Tests: No results for input(s): AST, ALT, ALKPHOS, BILITOT, PROT, ALBUMIN in the last 168 hours. CBG: No results for input(s): GLUCAP in the last 168  hours.  Discharge time spent: greater than 30 minutes.  Signed: Elidia Toribio Furnace, MD [facility] 01/27/2024

## 2024-01-28 ENCOUNTER — Telehealth: Payer: Self-pay | Admitting: Internal Medicine

## 2024-01-28 ENCOUNTER — Encounter: Payer: Self-pay | Admitting: *Deleted

## 2024-01-28 NOTE — Telephone Encounter (Signed)
 Left message to call back.

## 2024-01-28 NOTE — Progress Notes (Signed)
 Patient enrolled for [employer] to ship a 30 day cardiac event monitor to her address on file. Dr. Santo to read.

## 2024-01-28 NOTE — Telephone Encounter (Signed)
 Pt calling in regards to labs she had done last month as well as questions regarding hospital visit. Please advise.

## 2024-01-31 ENCOUNTER — Telehealth: Payer: Self-pay | Admitting: Student

## 2024-01-31 NOTE — Telephone Encounter (Signed)
 Returned call to patient no answer.Left message to call back.

## 2024-01-31 NOTE — Telephone Encounter (Signed)
 April Donaldson called tonight concerned about elevated blood pressure readings with systolics 200s and diastolics in the 100s but she is asymptomatic at this time.  She denies any chest pain, shortness of breath, trouble with her vision, focal neurologic deficits, headache, or syncope.  She recently was transition from hydrochlorothiazide  to chlorthalidone and she was wondering if this medication could be causing her blood pressure to go up.  Discussed that this medication is unlikely to cause and was only helping blood pressure.  She is also on lisinopril .  Recommended she take a few moments and recheck after sitting for 5 minutes with feet on the ground.  Instructed that if she started to develop any symptoms as described above to seek care in the emergency department right away.  She demonstrated understanding.  April Donaldson. Vonda, MD

## 2024-02-01 ENCOUNTER — Encounter (HOSPITAL_COMMUNITY): Payer: Self-pay

## 2024-02-01 ENCOUNTER — Telehealth: Payer: Self-pay | Admitting: Internal Medicine

## 2024-02-01 ENCOUNTER — Other Ambulatory Visit: Payer: Self-pay

## 2024-02-01 ENCOUNTER — Emergency Department (HOSPITAL_COMMUNITY)
Admission: EM | Admit: 2024-02-01 | Discharge: 2024-02-01 | Disposition: A | Discharge status: Home / Self Care | Attending: Emergency Medicine | Admitting: Emergency Medicine

## 2024-02-01 DIAGNOSIS — Z79899 Other long term (current) drug therapy: Secondary | ICD-10-CM | POA: Insufficient documentation

## 2024-02-01 DIAGNOSIS — E876 Hypokalemia: Secondary | ICD-10-CM | POA: Insufficient documentation

## 2024-02-01 DIAGNOSIS — I10 Essential (primary) hypertension: Secondary | ICD-10-CM | POA: Insufficient documentation

## 2024-02-01 DIAGNOSIS — Z7982 Long term (current) use of aspirin: Secondary | ICD-10-CM | POA: Insufficient documentation

## 2024-02-01 LAB — CBC WITH DIFFERENTIAL/PLATELET
Abs Immature Granulocytes: 0.02 K/uL (ref 0.00–0.07)
Basophils Absolute: 0.1 K/uL (ref 0.0–0.1)
Basophils Relative: 1 %
Eosinophils Absolute: 0.1 K/uL (ref 0.0–0.5)
Eosinophils Relative: 2 %
HCT: 39 % (ref 36.0–46.0)
Hemoglobin: 12.8 g/dL (ref 12.0–15.0)
Immature Granulocytes: 0 %
Lymphocytes Relative: 40 %
Lymphs Abs: 3.1 K/uL (ref 0.7–4.0)
MCH: 30 pg (ref 26.0–34.0)
MCHC: 32.8 g/dL (ref 30.0–36.0)
MCV: 91.3 fL (ref 80.0–100.0)
Monocytes Absolute: 0.8 K/uL (ref 0.1–1.0)
Monocytes Relative: 10 %
Neutro Abs: 3.6 K/uL (ref 1.7–7.7)
Neutrophils Relative %: 47 %
Platelets: 306 K/uL (ref 150–400)
RBC: 4.27 MIL/uL (ref 3.87–5.11)
RDW: 12.9 % (ref 11.5–15.5)
WBC: 7.7 K/uL (ref 4.0–10.5)
nRBC: 0 % (ref 0.0–0.2)

## 2024-02-01 LAB — COMPREHENSIVE METABOLIC PANEL WITH GFR
ALT: 27 U/L (ref 0–44)
AST: 29 U/L (ref 15–41)
Albumin: 3.6 g/dL (ref 3.5–5.0)
Alkaline Phosphatase: 60 U/L (ref 38–126)
Anion gap: 12 (ref 5–15)
BUN: 25 mg/dL — ABNORMAL HIGH (ref 8–23)
CO2: 25 mmol/L (ref 22–32)
Calcium: 9 mg/dL (ref 8.9–10.3)
Chloride: 103 mmol/L (ref 98–111)
Creatinine, Ser: 1.22 mg/dL — ABNORMAL HIGH (ref 0.44–1.00)
GFR, Estimated: 42 mL/min — ABNORMAL LOW (ref >60)
Glucose, Bld: 93 mg/dL (ref 70–99)
Potassium: 3.3 mmol/L — ABNORMAL LOW (ref 3.5–5.1)
Sodium: 140 mmol/L (ref 135–145)
Total Bilirubin: 0.6 mg/dL (ref 0.0–1.2)
Total Protein: 7.2 g/dL (ref 6.5–8.1)

## 2024-02-01 LAB — TROPONIN I (HIGH SENSITIVITY): Troponin I (High Sensitivity): 13 ng/L (ref <18)

## 2024-02-01 MED ORDER — POTASSIUM CHLORIDE CRYS ER 20 MEQ PO TBCR
40.0000 meq | EXTENDED_RELEASE_TABLET | Freq: Once | ORAL | Status: DC
  Filled 2024-02-01: qty 2

## 2024-02-01 MED ORDER — MAGNESIUM OXIDE -MG SUPPLEMENT 400 (240 MG) MG PO TABS
800.0000 mg | ORAL_TABLET | Freq: Once | ORAL | Status: DC
  Filled 2024-02-01: qty 2

## 2024-02-01 NOTE — Telephone Encounter (Signed)
 Pt c/o BP issue: STAT if pt c/o blurred vision, one-sided weakness or slurred speech  1. What are your last 5 BP readings? 181/102 when she woke up but since then it's dropped to 174/100  2. Are you having any other symptoms (ex. Dizziness, headache, blurred vision, passed out)? No  3. What is your BP issue? Pt is very concerned about her BP being elevated lately. She stated the same thing happened yesterday as well. Please advise

## 2024-02-01 NOTE — ED Provider Notes (Signed)
 [place] EMERGENCY DEPARTMENT AT [facility] Provider Note   CSN: [id number] Arrival date & time: 02/01/24  1314     Patient presents with: Hypertension   April Donaldson is a 89 y.o. female.   89 yo F with a cc of high blood pressure.  Patient has been taking her blood pressure since she was discharged from the hospital on Sunday.  Has been high.  She called her doctor today and the triage nurse told her she needed to come immediately to the hospital for reevaluation.  Patient denies any symptoms.  Denies chest pain difficulty breathing headaches neck pain change in vision.  She recently had her medications changed while she was in the hospital.  States compliance with her medications.   Hypertension       Prior to Admission medications   Medication Sig Start Date End Date Taking? Authorizing Provider  acetaminophen  (TYLENOL ) 500 MG tablet Take 500 mg by mouth every 6 (six) hours as needed for mild pain (pain score 1-3).    [provider]  allopurinol  (ZYLOPRIM ) 100 MG tablet TAKE 1 TABLET BY MOUTH EVERY DAY Patient taking differently: Take 100 mg by mouth daily at 12 noon. 05/14/23   Rollene Almarie LABOR, MD  aspirin  81 MG chewable tablet Chew 81 mg by mouth daily at 12 noon.    [provider]  carboxymethylcellul-glycerin (REFRESH OPTIVE) 0.5-0.9 % ophthalmic solution Place 1-2 drops into both eyes 4 (four) times daily as needed for dry eyes.    [provider]  chlorthalidone  (HYGROTON ) 25 MG tablet Take 1 tablet (25 mg total) by mouth daily. 01/28/24   Arrien, Mauricio Daniel, MD  Cholecalciferol (VITAMIN D ) 50 MCG (2000 UT) CAPS Take 2,000 Units by mouth in the morning.    [provider]  dorzolamide -timolol  (COSOPT ) 22.3-6.8 MG/ML ophthalmic solution Place 1 drop into both eyes 2 (two) times daily.    [provider]  ezetimibe  (ZETIA ) 10 MG tablet Take 10 mg by mouth daily.    [provider]  latanoprost   (XALATAN ) 0.005 % ophthalmic solution Place 1 drop into both eyes at bedtime.    [provider]  levothyroxine  (SYNTHROID ) 50 MCG tablet TAKE 1 TABLET BY MOUTH DAILY BEFORE BREAKFAST 11/08/23   Rollene Almarie LABOR, MD  lisinopril  (ZESTRIL ) 10 MG tablet Take 1 tablet (10 mg total) by mouth 2 (two) times daily. 12/28/23   Chandrasekhar, Stanly LABOR, MD  Menthol, Topical Analgesic, (BIOFREEZE ROLL-ON EX) Apply 1 Application topically as needed (muscle/arthritis type pain).    [provider]  rosuvastatin  (CRESTOR ) 10 MG tablet Take 1 tablet (10 mg total) by mouth daily. 07/25/23 01/26/24  Chandrasekhar, Stanly LABOR, MD  Wheat Dextrin (BENEFIBER DRINK MIX PO) Take 1 packet by mouth daily. Use one packet daily either in a drink or apply over food.    [provider]    Allergies: Iodinated contrast media, Amlodipine  besylate, Clonidine, Diltiazem hcl, Klonopin  [clonazepam ], Tetracycline, Doxycycline hyclate, Hydralazine, Iodine, Labetalol hcl, Lidocaine , and Olmesartan medoxomil    Review of Systems  Updated Vital Signs BP (!) 194/91   Pulse 70   Temp 98 F (36.7 C) (Oral)   Resp 18   SpO2 97%   Physical Exam Vitals and nursing note reviewed.  Constitutional:      General: She is not in acute distress.    Appearance: She is well-developed. She is not diaphoretic.  HENT:     Head: Normocephalic and atraumatic.  Eyes:  Pupils: Pupils are equal, round, and reactive to light.  Cardiovascular:     Rate and Rhythm: Normal rate and regular rhythm.     Heart sounds: No murmur heard.    No friction rub. No gallop.  Pulmonary:     Effort: Pulmonary effort is normal.     Breath sounds: No wheezing or rales.  Abdominal:     General: There is no distension.     Palpations: Abdomen is soft.     Tenderness: There is no abdominal tenderness.  Musculoskeletal:        General: No tenderness.     Cervical back: Normal range of motion and neck supple.  Skin:    General:  Skin is warm and dry.  Neurological:     Mental Status: She is alert and oriented to person, place, and time.  Psychiatric:        Behavior: Behavior normal.     (all labs ordered are listed, but only abnormal results are displayed) Labs Reviewed  COMPREHENSIVE METABOLIC PANEL WITH GFR - Abnormal; Notable for the following components:      Result Value   Potassium 3.3 (*)    BUN 25 (*)    Creatinine, Ser 1.22 (*)    GFR, Estimated 42 (*)    All other components within normal limits  CBC WITH DIFFERENTIAL/PLATELET  TROPONIN I (HIGH SENSITIVITY)    EKG: EKG Interpretation Date/Time:  Friday February 01 2024 16:28:49 EST Ventricular Rate:  71 PR Interval:  151 QRS Duration:  92 QT Interval:  441 QTC Calculation: 480 R Axis:   262  Text Interpretation: Sinus rhythm Right superior axis Low voltage, precordial leads No significant change since last tracing Confirmed by Emil Share [phone]) on 02/01/2024 4:32:35 PM  Radiology: No results found.   Procedures   Medications Ordered in the ED  potassium chloride  SA (KLOR-CON  M) CR tablet 40 mEq (40 mEq Oral Patient Refused/Not Given 02/01/24 1750)  magnesium  oxide (MAG-OX) tablet 800 mg (800 mg Oral Patient Refused/Not Given 02/01/24 1749)                                    Medical Decision Making Amount and/or Complexity of Data Reviewed Labs: ordered. ECG/medicine tests: ordered.  Risk OTC drugs. Prescription drug management.   89 yo F a cc of HTN.  High blood pressure noted at home. No symptoms.    Recently hospitalized.  Records reviewed.  Patient with elevated troponin level.  Hospitalist note concern for hypertensive urgency versus emergency.  Blood pressure here greater than 200 systolic.  Mild hypokalemia.  Renal function at baseline.  Troponin negative.  I did offer to discuss case with the hospitalist for possible admission or medication change.  Patient is declining.  Like to go home at this time.  Wants to  discuss with her family doctor.  7:23 PM:  I have discussed the diagnosis/risks/treatment options with the patient.  Evaluation and diagnostic testing in the emergency department does not suggest an emergent condition requiring admission or immediate intervention beyond what has been performed at this time.  They will follow up with PCP. We also discussed returning to the ED immediately if new or worsening sx occur. We discussed the sx which are most concerning (e.g., sudden worsening pain, fever, inability to tolerate by mouth, chest pain, sob, stroke s/sx) that necessitate immediate return. Medications administered to the patient during their visit  and any new prescriptions provided to the patient are listed below.  Medications given during this visit Medications  potassium chloride  SA (KLOR-CON  M) CR tablet 40 mEq (40 mEq Oral Patient Refused/Not Given 02/01/24 1750)  magnesium  oxide (MAG-OX) tablet 800 mg (800 mg Oral Patient Refused/Not Given 02/01/24 1749)     The patient appears reasonably screen and/or stabilized for discharge and I doubt any other medical condition or other [facility] requiring further screening, evaluation, or treatment in the ED at this time prior to discharge.       Final diagnoses:  Uncontrolled hypertension    ED Discharge Orders     None          Emil Share, [place] 02/01/24 [id number]

## 2024-02-01 NOTE — Discharge Instructions (Signed)
 Your blood pressure was elevated today.  Please let your family doctor know.  See what they can see you in clinic.  As we discussed please return for chest pain difficulty breathing headache one-sided numbness or weakness or difficulty speech or swallowing.

## 2024-02-01 NOTE — ED Triage Notes (Signed)
 PT was discharged from hospital due N-stemi on Sunday.   Pt report to primary care BP readings on the 180's-200's, they recommend to come to ER.   Pt does not have complaints at this time, denies chest pain, dizziness or numbness.

## 2024-02-01 NOTE — ED Provider Triage Note (Signed)
 Emergency Medicine Provider Triage Evaluation Note  April Donaldson , a 89 y.o. female  was evaluated in triage.  Pt complains of elevated blood pressure.  Patient reports that she was experiencing elevated blood pressure since around the second week of November.  She recently increased her lisinopril  from 15 to 20 mg.  She does take her blood pressure daily.  She reports that it was initially controlled with the increase in lisinopril  however has been slowly climbing back up.  Recently had her hydrochlorothiazide  changed to different medication however she does not remove the name.  She reports that her her blood pressure has been in the 180s to 200s yesterday and today.  She denies any headache, vision changes, shortness of breath, trouble walking, trouble talking.  Given the elevated number, she was concerned so called the triage nurse who informed her to come to the ER.  Review of Systems  Positive:  Negative:   Physical Exam  BP (!) 192/90 (BP Location: Right Arm)   Pulse 66   Temp 97.7 F (36.5 C)   Resp 15   SpO2 96%  Gen:   Awake, no distress   Resp:  Normal effort  MSK:   Moves extremities without difficulty  Other:  Cranial nerves grossly intact.  Answering questions appropriately appropriate speech.  Medical Decision Making  Medically screening exam initiated at 1:59 PM.  Appropriate orders placed.  Drucella FORBES Benders was informed that the remainder of the evaluation will be completed by another provider, this initial triage assessment does not replace that evaluation, and the importance of remaining in the ED until their evaluation is complete.  Labs ordered. Patient asymptomatic.    Bernis Ernst, PA-C 02/01/24 1401

## 2024-02-01 NOTE — Telephone Encounter (Signed)
 Spoke with Pt. BP has been running high. This morning before medication BP was 174/100. Current BP - 206/112 Pulse 71.Pt denies any current symptoms  and stated only feeling  like her heart was pumping faster this more when BP was high. She does not feel that way on the phone now. Advised pt to go to ED or call 911 as BP was getting to high and it needed to be addressed. Pt stated she did not want to go to. Advised again. Pt did finally state understanding

## 2024-02-04 ENCOUNTER — Telehealth: Payer: Self-pay | Admitting: Internal Medicine

## 2024-02-04 LAB — BASIC METABOLIC PANEL WITH GFR
BUN/Creatinine Ratio: 26 (ref 12–28)
BUN: 34 mg/dL (ref 10–36)
CO2: 27 mmol/L (ref 20–29)
Calcium: 9.7 mg/dL (ref 8.7–10.3)
Chloride: 101 mmol/L (ref 96–106)
Creatinine, Ser: 1.3 mg/dL — ABNORMAL HIGH (ref 0.57–1.00)
Glucose: 90 mg/dL (ref 70–99)
Potassium: 3.8 mmol/L (ref 3.5–5.2)
Sodium: 141 mmol/L (ref 134–144)
eGFR: 39 mL/min/1.73 — ABNORMAL LOW (ref >59)

## 2024-02-04 NOTE — Telephone Encounter (Signed)
 Pt was seen in the ER for hypertension on Friday and was given a heart monitor and would like assistance with applying. Please advise

## 2024-02-05 ENCOUNTER — Ambulatory Visit: Payer: Self-pay | Admitting: Student

## 2024-02-05 NOTE — Progress Notes (Signed)
 Spoke with patient regarding labs. Asked if letter with results can be mailed to her. Letter will be mailed today.

## 2024-02-07 NOTE — Progress Notes (Unsigned)
 Cardiology Office Note:    Date:  02/14/2024   ID:  April Donaldson, DOB June 13, 1931, MRN [id number]  PCP:  Rollene Almarie LABOR, MD  Cardiologist:  Stanly LABOR Leavens, MD     Referring MD: Rollene Almarie LABOR, MD   Chief Complaint: hospital follow-up of hypertensive emergency   History of Present Illness:    April Donaldson is a 89 y.o. female with a history of  chronic HFpEF, asymptomatic bradycardia, asymptomatic SVT, hypertension, hyperlipidemia, hypothyroidism, type 2 diabetes mellitus, solitary kidney with CKD stage III, obstructive sleep apnea on CPAP,  GERD, IBS, and chronic low back pain who is followed by Dr. Leavens and presents today for hospital follow-up of hypertensive emergency.  Patient was previously followed by Dr. Claudene and now follows with Dr. Leavens. Monitor in 10/2022 showed predominantly sinus rhythm with average heart rate of 58 bpm, occasional asymptomatic SVT (longest run 18 beats) and occasional PACs (burden 1.2%).  Echo in 11/2022 showed LVEF of 55-60% with mild LVH and grade 2 diastolic dysfunction, normal RV function, mild biatrial enlargement, and mild MR.  She was last seen by Dr. Arnetha in 09/2023 at which time she was stable from a cardiac standpoint.   She was recently admitted from 01/25/2024 to 01/27/2024 after presenting with chest pain. BP was markedly elevated on arrival at 200/03. EKG showed no acute ischemic changes. High-sensitivity troponin 25 >> 225 >> 302 >> 264. She reported intermittent intermittent tachypalpitations over the last week with associated weakness and shortness of breath. She also described some musculoskeletal chest pain but nothing that sounded like angina. Echo showed LVEF of 60-65% with normal wall motion and grade 1 diastolic dysfunction, normal RV function, and no significant valvular disease. Cardiology was consulted. Troponin elevation was felt to be consistent with demand ischemia due to hypertensive  emergency. There was also question of a possible tachyarrhythmia. Antihypertensives were adjusted and Event Monitor was ordered.   She was seen back in the ED on 02/01/2024 for uncontrolled hypertension. BP was 194/ 91. High-sensitivity troponin was negative. Labs showed mild hypokalemia but were otherwise unremarkable. Potassium was repleted. Admission was offered but patient declined.   Patient presents today for follow-up. BP reasonably well controlled on home logs - BP ranged from 117-178/ 70-97 but systolic BP mostly in the 130-150s. She has had a couple episodes of heart racing at night but no significant palpitations. No chest pain. She reports dyspnea if she walks too far but this is not new and is stable. No orthopnea/ PND, edema. No lightheadedness/ dizziness or syncope. She states she is supposed to be suing her CPAP machine but has not been doing this lately while wearing the Event Monitor.   BP was initially 112/84 in the office. However, on my personal recheck at the end of visit I got 202/88 in the left arm and 204/80 in the right arm. We had her sit and relax for about 10 minutes and then rechecked her BP with automatic machine and got 209/92. She was completely asymptomatic but was frustrated by the situation.   EKGs/Labs/Other Studies Reviewed:    The following studies were reviewed:  Echocardiogram 01/26/2024: Impressions:1. Left ventricular ejection fraction, by estimation, is 60 to 65%. The  left ventricle has normal function. The left ventricle has no regional  wall motion abnormalities. Left ventricular diastolic parameters are  consistent with Grade I diastolic  dysfunction (impaired relaxation).   2. Right ventricular systolic function is normal. The right ventricular  size is  normal. There is normal pulmonary artery systolic pressure.   3. The mitral valve is normal in structure. No evidence of mitral valve  regurgitation. No evidence of mitral stenosis.   4. The aortic  valve is calcified. There is mild calcification of the  aortic valve. There is mild thickening of the aortic valve. Aortic valve  regurgitation is not visualized. Aortic valve sclerosis/calcification is  present, without any evidence of  aortic stenosis.   5. The inferior vena cava is normal in size with greater than 50%  respiratory variability, suggesting right atrial pressure of 3 mmHg.   EKG:  EKG not ordered today.   Recent Labs: 05/01/2023: TSH 2.25 01/27/2024: Magnesium  2.1 02/01/2024: ALT 27; Hemoglobin 12.8; Platelets 306 02/04/2024: BUN 34; Creatinine, Ser 1.30; Potassium 3.8; Sodium 141  Recent Lipid Panel    Component Value Date/Time   CHOL 150 09/13/2023 1014   TRIG 124 09/13/2023 1014   HDL 58 09/13/2023 1014   CHOLHDL 2.6 09/13/2023 1014   CHOLHDL 3 03/27/2022 1134   VLDL 26.4 03/27/2022 1134   LDLCALC 70 09/13/2023 1014    Physical Exam:    Vital Signs: BP (!) 209/92   Pulse 62   Ht 4' 9.5 (1.461 m)   Wt 162 lb 6.4 oz (73.7 kg)   SpO2 98%   BMI 34.53 kg/m     Wt Readings from Last 3 Encounters:  02/14/24 162 lb 6.4 oz (73.7 kg)  01/27/24 162 lb 9.6 oz (73.8 kg)  01/11/24 164 lb (74.4 kg)     General: 89 y.o. obese African-American female in no acute distress. HEENT: Normocephalic and atraumatic. Sclera clear.  Neck: Supple. No carotid bruits. No JVD. Heart: RRR. II/ VI systolic murmur.  Lungs: No increased work of breathing. Clear to ausculation bilaterally. No wheezes, rhonchi, or rales.  Extremities: No lower extremity edema.   Skin: Warm and dry. Neuro: No focal deficits. Psych: Normal affect. Responds appropriately.  Assessment:    1. Primary hypertension   2. Chronic heart failure with preserved ejection fraction (HFpEF) (HCC)   3. Palpitations   4. Hyperlipidemia, unspecified hyperlipidemia type   5. Type 2 diabetes mellitus with complication, without long-term current use of insulin (HCC)   6. Stage 3 chronic kidney disease, unspecified  whether stage 3a or 3b CKD (HCC)     Plan:    Hypertension Patient was recently admitted in 12/2023 with hypertensive emergency. Echo showed normal LV function. Antihypertensive medications were adjusted. She was seen in the ED again for uncontrolled BP on 02/01/2024 but was discharged.  - BP was initially 112/84 in the office. However, on my personal recheck at the end of visit I got 202/88 in the left arm and 204/80 in the right arm. We had her sit and relax for about 10 minutes and then rechecked her BP with automatic machine and got 209/92. However, BP mostly well controlled on home log at home.  - Completely asymptomatic in the office. - Current medications: Lisinopril  10mg  twice daily and Chlorthalidone  25mg  daily.  - Unfortunately she has been unable to tolerate multiple antihypertensives including Amlodipine  (edema), Diltiazem (edema), Clonidine (sweating, felt faint), Hydralazine (rash), Labetalol (unknown), and Olmesartan (unknown).  - Reviewed with Pharmacist who recommended adding Nifedipine  30mg  daily or Clonidine patch (felt that the symptoms of sweating and feeling faint with the PO Clonidine were likely due to a sudden drop in BP). Asked patient to go home and repeat BP and start the Nifedipine  if BP still >160/100. If  BP <160/100, she can hold off on starting the Nifedipine  for now. - Will try to arrange nursing visit within the next few days to make sure home BP machine is accurate. Will also have her follow-up in about 1 month.   - Reviewed ED precautions.   Chronic HFpEF Recent Echo in 12/2023 showed LVEF of 60-65% with normal wall motion and grade 1 diastolic dysfunction, normal RV function, and no significant valvular disease.  - Euvolemic on exam.  - No need for loop diuretic.  - Continue management of hypertension as above.  Palpitations Patient reported intermittent tachypalpitations during recent admission in 12/2023. No arrhythmias were noted on telemetry. Event  Monitor was ordered at discharge and is still pending. - She has had a few episodes of heart racing at night but no significant palpitations.  - No AV nodal agents given baseline bradycardiac.  - Will wait for monitor results.   Hyperlipidemia LDL 70 in 08/2023.  - Continue Crestor  10mg  daily and Zetia  10mg  daily.   Type 2 Diabetes Mellitus - Not on any medications.  - Management per PCP.   CKD Stage III Patient only has one functioning kidney. Baseline creatinine around 1.3 to 1.4.   Disposition: Follow up in 1 month.    Signed, Dori Devino E Braeton Wolgamott, PA-C  02/14/2024 3:24 PM    [facility]

## 2024-02-10 ENCOUNTER — Other Ambulatory Visit: Payer: Self-pay | Admitting: Internal Medicine

## 2024-02-11 NOTE — Telephone Encounter (Signed)
 Results reviewed with pt by CMA on 02/05/24 please see encounter for more information.

## 2024-02-14 ENCOUNTER — Ambulatory Visit: Admitting: Student

## 2024-02-14 ENCOUNTER — Encounter: Payer: Self-pay | Admitting: Student

## 2024-02-14 ENCOUNTER — Other Ambulatory Visit (HOSPITAL_COMMUNITY): Payer: Self-pay

## 2024-02-14 VITALS — BP 209/92 | HR 62 | Ht <= 58 in | Wt 162.4 lb

## 2024-02-14 DIAGNOSIS — E118 Type 2 diabetes mellitus with unspecified complications: Secondary | ICD-10-CM

## 2024-02-14 DIAGNOSIS — N183 Chronic kidney disease, stage 3 unspecified: Secondary | ICD-10-CM | POA: Diagnosis not present

## 2024-02-14 DIAGNOSIS — R002 Palpitations: Secondary | ICD-10-CM | POA: Diagnosis not present

## 2024-02-14 DIAGNOSIS — E785 Hyperlipidemia, unspecified: Secondary | ICD-10-CM | POA: Diagnosis not present

## 2024-02-14 DIAGNOSIS — I5032 Chronic diastolic (congestive) heart failure: Secondary | ICD-10-CM

## 2024-02-14 DIAGNOSIS — I10 Essential (primary) hypertension: Secondary | ICD-10-CM | POA: Diagnosis not present

## 2024-02-14 MED ORDER — CHLORTHALIDONE 25 MG PO TABS: 25.0000 mg | ORAL_TABLET | Freq: Every day | ORAL | 3 refills | Status: AC

## 2024-02-14 MED FILL — Nifedipine Tab ER 24HR Osmotic Release 30 MG: 30.0000 mg | ORAL | 30 days supply | Qty: 30 | Fill #0 | Status: AC

## 2024-02-14 NOTE — Patient Instructions (Addendum)
 Thank you for choosing [facility]!     Medication Instructions:  Once you get home today, check your blood pressure after 10 to 15 minutes of rest.   If your blood pressure is 160/100 start the Nifedipine  30mg . HOLD if blood pressure is below 160/100  A nurse visit will be scheduled for you for next week. *If you need a refill on your cardiac medications before your next appointment, please call your pharmacy*   Lab Work: No labs were ordered during today's visit.  If you have labs (blood work) drawn today and your tests are completely normal, you will receive your results only by: MyChart Message (if you have MyChart) OR A paper copy in the mail If you have any lab test that is abnormal or we need to change your treatment, we will call you to review the results.   Testing/Procedures: No procedures were ordered during today's visit.   Your next appointment:  nurse visit for blood pressure check. Please bring you blood pressure cuff 1 month(s)   Provider:   Stanly DELENA Leavens, MD or Aline Door    Follow-Up: At [facility], you and your health needs are our priority.  As part of our continuing mission to provide you with exceptional heart care, we have created designated Provider Care Teams.  These Care Teams include your primary Cardiologist (physician) and Advanced Practice Providers (APPs -  Physician Assistants and Nurse Practitioners) who all work together to provide you with the care you need, when you need it. We recommend signing up for the patient portal called MyChart.  Sign up information is provided on this After Visit Summary.  MyChart is used to connect with patients for Virtual Visits (Telemedicine).  Patients are able to view lab/test results, encounter notes, upcoming appointments, etc.  Non-urgent messages can be sent to your provider as well.   To learn more about what you can do with MyChart, go to [email or web address].

## 2024-02-15 ENCOUNTER — Ambulatory Visit: Payer: Self-pay

## 2024-02-15 NOTE — Telephone Encounter (Signed)
 FYI Only or Action Required?: FYI only for provider: appointment scheduled on 02/18/24.  Patient was last seen in primary care on 05/18/2023 by Alvia Corean CROME, FNP.  Called Nurse Triage reporting Hypertension.  Symptoms began several weeks ago.  Interventions attempted: Prescription medications: Lisinopril ; Zetia ; Hydroton.  Symptoms are: unchanged.  Triage Disposition: See PCP When Office is Open (Within 3 Days)  Patient/caregiver understands and will follow disposition?: Yes    Copied from CRM #[id number]. Topic: Clinical - Red Word Triage >> Feb 15, 2024  1:10 PM Harlene ORN wrote: Red Word that prompted transfer to Nurse Triage: Patient has been in and out of the hospital since November for her heart problems, specifically heart racing. She's been still having heart complications, even with the heart monitor.     Reason for Disposition  Systolic BP >= 160 OR Diastolic >= 100  Answer Assessment - Initial Assessment Questions Pt called in to report elevated BP and wanted to f/u with PCP after cardiology appt yesterday, 12/18. Pt reports taking BP am and HS and it is always elevated; at cardiologist yesterday, BP 112/84 in office. Pt states she doesn't know if cuff is different or why there was that significant of a change in readings. Pt states that when BP is elevated she does not have symptoms. Pt went to ED 12/05 for HTN, 12/18 event monitor placed for palpitations; pt reports she just has a lot going on. Pt is taking HTN medication and diuretics; states she was given a new rx but has not started it yet, would like to discuss s/e profile prior to initiation; NIFEdipine  (PROCARDIA -XL/NIFEDICAL-XL) 30 MG 24 hr tablet. Discussed no appts with PCP until January, recommended sooner appt with alternative provider. Pt schedule but voiced that she worries with all her regular providers not being available that plan of care will be changed and she cannot keep up. Reassured her that all  providers have access to her chart and will see PCPs discussed POC as well as our conversation today. Pt voiced understanding. Appointment scheduled for evaluation. Patient agrees with plan of care, and will call back if anything changes, or if symptoms worsen.     1. BLOOD PRESSURE: What is your blood pressure? Did you take at least two measurements 5 minutes apart?     Today 159/97 upon waking up; 165/92 at 10:00am  2. ONSET: When did you take your blood pressure?     Normally takes BID; am and HS  3. HOW: How did you take your blood pressure? (e.g., automatic home BP monitor, visiting nurse)     Automatic home monitor   4. HISTORY: Do you have a history of high blood pressure?     Yes  5. MEDICINES: Are you taking any medicines for blood pressure? Have you missed any doses recently?     Lisinopril  10mg  BID      Zetia  10mg  daily       Hydroton 25 mg daily  6. OTHER SYMPTOMS: Do you have any symptoms? (e.g., blurred vision, chest pain, difficulty breathing, headache, weakness)    None  Protocols used: Blood Pressure - High-A-AH

## 2024-02-18 ENCOUNTER — Ambulatory Visit: Admitting: Family Medicine

## 2024-02-18 ENCOUNTER — Telehealth: Payer: Self-pay

## 2024-02-18 NOTE — Telephone Encounter (Signed)
 Pt stated that she if feeling pretty good and will call back if she needs a new appt.

## 2024-02-18 NOTE — Telephone Encounter (Signed)
 Copied from CRM #[id number]. Topic: Appointments - Appointment Cancel/Reschedule >> Feb 18, 2024  9:11 AM Robinson H wrote: Patient/patient representative is calling to cancel or reschedule an appointment. Appointment was scheduled by NT and appointment is today at 11:20am  [facility] [phone]

## 2024-03-06 ENCOUNTER — Ambulatory Visit: Attending: Student

## 2024-03-06 DIAGNOSIS — R002 Palpitations: Secondary | ICD-10-CM

## 2024-03-07 DIAGNOSIS — R002 Palpitations: Secondary | ICD-10-CM | POA: Diagnosis not present

## 2024-03-10 NOTE — Progress Notes (Signed)
 Spoke with patient regarding monitor, verbalized understanding. She is taking her bp, ranging from 160/96 being the highest reading on 03/04/24. Today's 140/84 was her lowest reading since she's been monitoring her bp

## 2024-03-14 ENCOUNTER — Encounter: Payer: Self-pay | Admitting: Internal Medicine

## 2024-03-14 NOTE — Telephone Encounter (Signed)
 Error

## 2024-03-17 ENCOUNTER — Ambulatory Visit: Admitting: *Deleted

## 2024-03-17 DIAGNOSIS — I10 Essential (primary) hypertension: Secondary | ICD-10-CM | POA: Diagnosis not present

## 2024-03-17 NOTE — Patient Instructions (Signed)
 Medication Instructions:  No Changes  Lab Work: None   Follow-Up: At [facility], you and your health needs are our priority.  As part of our continuing mission to provide you with exceptional heart care, our providers are all part of one team.  This team includes your primary Cardiologist (physician) and Advanced Practice Providers or APPs (Physician Assistants and Nurse Practitioners) who all work together to provide you with the care you need, when you need it.  Your next appointment:    04/21/24 at 2:45 pm  Provider:    Aline Door, PA  We recommend signing up for the patient portal called MyChart.  Sign up information is provided on this After Visit Summary.  MyChart is used to connect with patients for Virtual Visits (Telemedicine).  Patients are able to view lab/test results, encounter notes, upcoming appointments, etc.  Non-urgent messages can be sent to your provider as well.   To learn more about what you can do with MyChart, go to [email or web address].   Other Instructions Aline Door, PA recommends that you continue your current medications. Keep checking your Blood Pressures at home, twice daily. If your top number (Systolic Blood Pressure) is consistently greater than 160, please call [phone] and speak to a triage nurse. You may need to be seen or start a medication.

## 2024-03-17 NOTE — Progress Notes (Addendum)
" ° °  Nurse Visit   Date of Encounter: 03/17/2024 ID: April Donaldson, DOB February 04, 1932, MRN [id number]  PCP:  Rollene Almarie LABOR, MD   [facility] Providers Cardiologist:  Stanly LABOR Leavens, MD      Visit Details   VS:  BP (!) 180/88 (BP Location: Left Arm, Patient Position: Sitting, Cuff Size: Normal) Comment: Manual re check at end of visit  Pulse 62   Ht 4' 9.5 (1.461 m)   Wt 160 lb 12.8 oz (72.9 kg)   SpO2 98%   BMI 34.19 kg/m  , BMI Body mass index is 34.19 kg/m.  Wt Readings from Last 3 Encounters:  03/17/24 160 lb 12.8 oz (72.9 kg)  02/14/24 162 lb 6.4 oz (73.7 kg)  01/27/24 162 lb 9.6 oz (73.8 kg)     Reason for visit: BP recheck (Initial BP=142/78 (manually checked), then 156/88 (using her own OMRON BP cuff), then 189/92 (using our Dinamap), then 180/88 manually rechecked at end of visit. Pt does not c/o any symptoms. She states she feels fine and that this has happened at other outpatient visits.  Performed today: Vitals, Provider consulted:Callie [place], PA and Cro[place] (DOD), and Education Changes (medications, testing, etc.) : Spoke to Callie Goodrich, [place] and she recommends: No Changes; continue current meds; if SBP consistently greater than 160, pt to get in touch with us . BP log and correct way to check BP tip sheet given to pt.    She will f/u with Jadine, PA on 04/21/24.   Length of Visit: 30 minutes  Recent BP LOG: Here they are (listed in order from most recent; most are AM, then PM):   128/77 (AM) 143/83 (PM) 129/79 (AM) 135/76 (PM) 116/72 (AM) 117/70 138/77 145/84 123/78 115/75 136/73 158/80 149/80 156/88 128/77  Medications Adjustments/Labs and Tests Ordered: No orders of the defined types were placed in this encounter.  No orders of the defined types were placed in this encounter.    Signed, Zara Buel Fret, RN  03/17/2024 1:00 PM  "

## 2024-03-17 NOTE — Telephone Encounter (Signed)
 Late entry we received telephone message on 1/19. Patient wore heart monitor already and her results were imported on 1/8

## 2024-03-19 ENCOUNTER — Other Ambulatory Visit: Payer: Self-pay

## 2024-03-19 ENCOUNTER — Telehealth: Payer: Self-pay

## 2024-03-19 ENCOUNTER — Telehealth: Payer: Self-pay | Admitting: Internal Medicine

## 2024-03-19 MED ORDER — ALLOPURINOL 100 MG PO TABS: 100.0000 mg | ORAL_TABLET | Freq: Every day | ORAL | 2 refills | Status: AC

## 2024-03-19 NOTE — Telephone Encounter (Signed)
Rx has been sent in. Pt is aware. 

## 2024-03-19 NOTE — Telephone Encounter (Unsigned)
 Copied from CRM #[id number]. Topic: Clinical - Medication Refill >> Mar 19, 2024 10:42 AM Ivette P wrote: Medication: allopurinol  (ZYLOPRIM ) 100 MG tablet   Has the patient contacted their pharmacy? Yes (Agent: If no, request that the patient contact the pharmacy for the refill. If patient does not wish to contact the pharmacy document the reason why and proceed with request.) (Agent: If yes, when and what did the pharmacy advise?)  This is the patient's preferred pharmacy:  [id number] [address], [place] - [address] [place] [address] Phone: [phone] Fax: [phone]  Is this the correct pharmacy for this prescription? Yes If no, delete pharmacy and type the correct one.   Has the prescription been filled recently? No  Is the patient out of the medication? Yes, about 10 pills left   Has the patient been seen for an appointment in the last year OR does the patient have an upcoming appointment? Yes, 04/10/24  Can we respond through MyChart? No  Agent: Please be advised that Rx refills may take up to 3 business days. We ask that you follow-up with your pharmacy.

## 2024-03-19 NOTE — Telephone Encounter (Signed)
 Copied from CRM #[id number]. Topic: Clinical - Medication Refill >> Mar 19, 2024 10:42 AM Ivette P wrote: Medication: allopurinol  (ZYLOPRIM ) 100 MG tablet   Has the patient contacted their pharmacy? Yes (Agent: If no, request that the patient contact the pharmacy for the refill. If patient does not wish to contact the pharmacy document the reason why and proceed with request.) (Agent: If yes, when and what did the pharmacy advise?)  This is the patient's preferred pharmacy:  [id number] [address], [place] - [address] [place] [address] Phone: [phone] Fax: [phone]  Is this the correct pharmacy for this prescription? Yes If no, delete pharmacy and type the correct one.   Has the prescription been filled recently? No  Is the patient out of the medication? Yes, about 10 pills left   Has the patient been seen for an appointment in the last year OR does the patient have an upcoming appointment? Yes, 04/10/24  Can we respond through MyChart? No  Agent: Please be advised that Rx refills may take up to 3 business days. We ask that you follow-up with your pharmacy.

## 2024-04-01 ENCOUNTER — Telehealth: Payer: Self-pay | Admitting: Student

## 2024-04-01 NOTE — Telephone Encounter (Addendum)
" ° °  Patient called answering service this evening. Called and spoke with patient.  She states she started to feel little lightheaded around 4 PM and checked her BP and it was 199/110.  However, she rechecked it shortly before I called her around 6:45 PM and it had improved to 171/93.  She is now asymptomatic.  She denies any chest pain, shortness of breath, lightheadedness/dizziness, strokelike symptoms.   She states she is taking all of her BP medications today.  I am very aware of this patient.  I have seen her in the office.  Her BP is usually well-controlled at home.  Given her advanced age and multiple medication intolerances, we have been aiming for a BP goal of less than 160/100.  Her home BP is normally well below this.  Recommended patient recheck her BP in a few hours and again tomorrow and let us  know if her BP is remaining above 160/100.  No medication changes made at this time.  Reviewed ED precautions.  Patient voiced understanding and thanked me for calling.  Sheika Coutts E Shakema Surita, PA-C 04/01/2024 9:06 PM   "

## 2024-04-10 ENCOUNTER — Ambulatory Visit

## 2024-04-17 ENCOUNTER — Ambulatory Visit: Payer: Medicare Other

## 2024-04-21 ENCOUNTER — Ambulatory Visit: Admitting: Student

## 2024-04-21 ENCOUNTER — Ambulatory Visit: Admitting: Cardiology

## 2024-07-08 ENCOUNTER — Ambulatory Visit: Admitting: Pulmonary Disease
